# Patient Record
Sex: Female | Born: 1950 | Race: White | Hispanic: No | Marital: Married | Smoking: Current every day smoker
Health system: Southern US, Community
[De-identification: ages and names within clinical notes are randomized; demographics above are authoritative.]

## PROBLEM LIST (undated history)

## (undated) DIAGNOSIS — M1990 Unspecified osteoarthritis, unspecified site: Secondary | ICD-10-CM

## (undated) DIAGNOSIS — Z86010 Personal history of colonic polyps: Secondary | ICD-10-CM

## (undated) DIAGNOSIS — K219 Gastro-esophageal reflux disease without esophagitis: Secondary | ICD-10-CM

## (undated) DIAGNOSIS — I480 Paroxysmal atrial fibrillation: Secondary | ICD-10-CM

## (undated) DIAGNOSIS — J45909 Unspecified asthma, uncomplicated: Secondary | ICD-10-CM

## (undated) DIAGNOSIS — I10 Essential (primary) hypertension: Secondary | ICD-10-CM

## (undated) DIAGNOSIS — K209 Esophagitis, unspecified without bleeding: Secondary | ICD-10-CM

## (undated) DIAGNOSIS — F32A Depression, unspecified: Secondary | ICD-10-CM

## (undated) DIAGNOSIS — K589 Irritable bowel syndrome without diarrhea: Secondary | ICD-10-CM

## (undated) DIAGNOSIS — Z9981 Dependence on supplemental oxygen: Secondary | ICD-10-CM

## (undated) DIAGNOSIS — D649 Anemia, unspecified: Secondary | ICD-10-CM

## (undated) DIAGNOSIS — E785 Hyperlipidemia, unspecified: Secondary | ICD-10-CM

## (undated) DIAGNOSIS — K2950 Unspecified chronic gastritis without bleeding: Secondary | ICD-10-CM

## (undated) DIAGNOSIS — F329 Major depressive disorder, single episode, unspecified: Secondary | ICD-10-CM

## (undated) DIAGNOSIS — R32 Unspecified urinary incontinence: Secondary | ICD-10-CM

## (undated) DIAGNOSIS — H53451 Other localized visual field defect, right eye: Secondary | ICD-10-CM

## (undated) DIAGNOSIS — K449 Diaphragmatic hernia without obstruction or gangrene: Secondary | ICD-10-CM

## (undated) DIAGNOSIS — E0590 Thyrotoxicosis, unspecified without thyrotoxic crisis or storm: Secondary | ICD-10-CM

## (undated) DIAGNOSIS — E119 Type 2 diabetes mellitus without complications: Secondary | ICD-10-CM

## (undated) DIAGNOSIS — I4892 Unspecified atrial flutter: Principal | ICD-10-CM

## (undated) DIAGNOSIS — K2980 Duodenitis without bleeding: Secondary | ICD-10-CM

## (undated) DIAGNOSIS — I639 Cerebral infarction, unspecified: Secondary | ICD-10-CM

## (undated) DIAGNOSIS — J449 Chronic obstructive pulmonary disease, unspecified: Secondary | ICD-10-CM

## (undated) DIAGNOSIS — K5790 Diverticulosis of intestine, part unspecified, without perforation or abscess without bleeding: Secondary | ICD-10-CM

## (undated) DIAGNOSIS — J189 Pneumonia, unspecified organism: Secondary | ICD-10-CM

## (undated) DIAGNOSIS — D126 Benign neoplasm of colon, unspecified: Secondary | ICD-10-CM

## (undated) HISTORY — DX: Hyperlipidemia, unspecified: E78.5

## (undated) HISTORY — DX: Unspecified chronic gastritis without bleeding: K29.50

## (undated) HISTORY — DX: Unspecified osteoarthritis, unspecified site: M19.90

## (undated) HISTORY — DX: Esophagitis, unspecified without bleeding: K20.90

## (undated) HISTORY — DX: Duodenitis without bleeding: K29.80

## (undated) HISTORY — PX: UPPER GI ENDOSCOPY: SHX6162

## (undated) HISTORY — PX: COLONOSCOPY: SHX174

## (undated) HISTORY — PX: TRACHEOSTOMY: SUR1362

## (undated) HISTORY — DX: Esophagitis, unspecified: K20.9

## (undated) HISTORY — DX: Personal history of colonic polyps: Z86.010

## (undated) HISTORY — DX: Benign neoplasm of colon, unspecified: D12.6

## (undated) HISTORY — DX: Anemia, unspecified: D64.9

## (undated) HISTORY — DX: Chronic obstructive pulmonary disease, unspecified: J44.9

## (undated) HISTORY — DX: Essential (primary) hypertension: I10

## (undated) HISTORY — DX: Cerebral infarction, unspecified: I63.9

## (undated) HISTORY — DX: Diverticulosis of intestine, part unspecified, without perforation or abscess without bleeding: K57.90

## (undated) HISTORY — DX: Unspecified asthma, uncomplicated: J45.909

## (undated) HISTORY — DX: Diaphragmatic hernia without obstruction or gangrene: K44.9

## (undated) HISTORY — DX: Irritable bowel syndrome, unspecified: K58.9

## (undated) HISTORY — DX: Gastro-esophageal reflux disease without esophagitis: K21.9

## (undated) HISTORY — DX: Unspecified urinary incontinence: R32

---

## 1979-08-31 HISTORY — PX: CHOLECYSTECTOMY: SHX55

## 1985-12-30 HISTORY — PX: BREAST BIOPSY: SHX20

## 1990-12-30 HISTORY — PX: VAGINAL HYSTERECTOMY: SUR661

## 1998-06-05 ENCOUNTER — Ambulatory Visit: Admission: RE | Admit: 1998-06-05 | Discharge: 1998-06-05 | Payer: Self-pay | Admitting: Internal Medicine

## 1998-11-08 ENCOUNTER — Ambulatory Visit (HOSPITAL_COMMUNITY): Admission: RE | Admit: 1998-11-08 | Discharge: 1998-11-08 | Payer: Self-pay | Admitting: Internal Medicine

## 1999-08-06 ENCOUNTER — Encounter: Payer: Self-pay | Admitting: Emergency Medicine

## 1999-08-06 ENCOUNTER — Emergency Department (HOSPITAL_COMMUNITY): Admission: EM | Admit: 1999-08-06 | Discharge: 1999-08-06 | Payer: Self-pay | Admitting: Emergency Medicine

## 1999-10-09 ENCOUNTER — Emergency Department (HOSPITAL_COMMUNITY): Admission: EM | Admit: 1999-10-09 | Discharge: 1999-10-09 | Payer: Self-pay | Admitting: Emergency Medicine

## 2000-05-06 ENCOUNTER — Encounter: Payer: Self-pay | Admitting: Urology

## 2000-05-08 ENCOUNTER — Ambulatory Visit (HOSPITAL_COMMUNITY): Admission: RE | Admit: 2000-05-08 | Discharge: 2000-05-08 | Payer: Self-pay | Admitting: Urology

## 2000-12-19 ENCOUNTER — Emergency Department (HOSPITAL_COMMUNITY): Admission: EM | Admit: 2000-12-19 | Discharge: 2000-12-19 | Payer: Self-pay | Admitting: Emergency Medicine

## 2001-05-29 ENCOUNTER — Ambulatory Visit (HOSPITAL_COMMUNITY): Admission: RE | Admit: 2001-05-29 | Discharge: 2001-05-29 | Payer: Self-pay | Admitting: Internal Medicine

## 2001-10-01 DIAGNOSIS — K2980 Duodenitis without bleeding: Secondary | ICD-10-CM

## 2001-10-01 HISTORY — DX: Duodenitis without bleeding: K29.80

## 2001-10-02 ENCOUNTER — Encounter (INDEPENDENT_AMBULATORY_CARE_PROVIDER_SITE_OTHER): Payer: Self-pay | Admitting: Specialist

## 2001-10-02 ENCOUNTER — Other Ambulatory Visit: Admission: RE | Admit: 2001-10-02 | Discharge: 2001-10-02 | Payer: Self-pay | Admitting: Gastroenterology

## 2002-06-23 ENCOUNTER — Encounter: Payer: Self-pay | Admitting: Pulmonary Disease

## 2002-06-23 ENCOUNTER — Ambulatory Visit (HOSPITAL_COMMUNITY): Admission: RE | Admit: 2002-06-23 | Discharge: 2002-06-23 | Payer: Self-pay | Admitting: Pulmonary Disease

## 2002-07-21 ENCOUNTER — Ambulatory Visit: Admission: RE | Admit: 2002-07-21 | Discharge: 2002-07-21 | Payer: Self-pay | Admitting: Pulmonary Disease

## 2006-01-22 ENCOUNTER — Emergency Department (HOSPITAL_COMMUNITY): Admission: EM | Admit: 2006-01-22 | Discharge: 2006-01-23 | Payer: Self-pay | Admitting: Emergency Medicine

## 2006-09-11 ENCOUNTER — Ambulatory Visit: Payer: Self-pay | Admitting: Internal Medicine

## 2007-12-23 ENCOUNTER — Emergency Department (HOSPITAL_COMMUNITY): Admission: EM | Admit: 2007-12-23 | Discharge: 2007-12-24 | Payer: Self-pay | Admitting: Emergency Medicine

## 2008-02-05 ENCOUNTER — Ambulatory Visit: Payer: Self-pay | Admitting: Internal Medicine

## 2008-02-05 DIAGNOSIS — J209 Acute bronchitis, unspecified: Secondary | ICD-10-CM

## 2008-02-05 DIAGNOSIS — J449 Chronic obstructive pulmonary disease, unspecified: Secondary | ICD-10-CM | POA: Insufficient documentation

## 2008-02-05 DIAGNOSIS — J4489 Other specified chronic obstructive pulmonary disease: Secondary | ICD-10-CM | POA: Insufficient documentation

## 2008-02-05 DIAGNOSIS — Z860100 Personal history of colon polyps, unspecified: Secondary | ICD-10-CM

## 2008-02-05 DIAGNOSIS — K449 Diaphragmatic hernia without obstruction or gangrene: Secondary | ICD-10-CM | POA: Insufficient documentation

## 2008-02-05 DIAGNOSIS — K219 Gastro-esophageal reflux disease without esophagitis: Secondary | ICD-10-CM

## 2008-02-05 HISTORY — DX: Personal history of colon polyps, unspecified: Z86.0100

## 2008-02-05 HISTORY — DX: Personal history of colonic polyps: Z86.010

## 2008-02-24 ENCOUNTER — Ambulatory Visit: Payer: Self-pay | Admitting: Internal Medicine

## 2008-02-24 DIAGNOSIS — E669 Obesity, unspecified: Secondary | ICD-10-CM

## 2008-02-25 ENCOUNTER — Ambulatory Visit: Payer: Self-pay | Admitting: Cardiology

## 2008-03-07 ENCOUNTER — Ambulatory Visit: Payer: Self-pay | Admitting: Internal Medicine

## 2008-03-08 ENCOUNTER — Encounter: Payer: Self-pay | Admitting: Cardiology

## 2008-03-08 ENCOUNTER — Ambulatory Visit: Payer: Self-pay

## 2008-03-18 ENCOUNTER — Encounter: Payer: Self-pay | Admitting: Internal Medicine

## 2008-03-18 ENCOUNTER — Ambulatory Visit: Payer: Self-pay | Admitting: Internal Medicine

## 2008-04-04 ENCOUNTER — Telehealth (INDEPENDENT_AMBULATORY_CARE_PROVIDER_SITE_OTHER): Payer: Self-pay | Admitting: *Deleted

## 2008-04-05 ENCOUNTER — Ambulatory Visit: Payer: Self-pay | Admitting: Internal Medicine

## 2008-04-19 ENCOUNTER — Ambulatory Visit: Payer: Self-pay | Admitting: Internal Medicine

## 2008-04-20 ENCOUNTER — Ambulatory Visit: Payer: Self-pay | Admitting: Cardiology

## 2008-04-27 ENCOUNTER — Ambulatory Visit: Payer: Self-pay | Admitting: Internal Medicine

## 2008-04-27 ENCOUNTER — Telehealth: Payer: Self-pay | Admitting: Internal Medicine

## 2008-06-21 ENCOUNTER — Ambulatory Visit: Payer: Self-pay | Admitting: Internal Medicine

## 2008-06-21 DIAGNOSIS — I10 Essential (primary) hypertension: Secondary | ICD-10-CM

## 2008-07-11 ENCOUNTER — Telehealth: Payer: Self-pay | Admitting: Internal Medicine

## 2008-08-11 ENCOUNTER — Telehealth (INDEPENDENT_AMBULATORY_CARE_PROVIDER_SITE_OTHER): Payer: Self-pay | Admitting: *Deleted

## 2008-08-12 ENCOUNTER — Encounter: Payer: Self-pay | Admitting: Internal Medicine

## 2008-09-13 ENCOUNTER — Telehealth (INDEPENDENT_AMBULATORY_CARE_PROVIDER_SITE_OTHER): Payer: Self-pay | Admitting: *Deleted

## 2009-11-19 IMAGING — CR DG CHEST 2V
2 series · 2 of 2 positions shown · non-contrast
Comparison: 01/23/06.

CLINICAL DATA: Cough and dyspnea for 3 months.  COPD. 
 CHEST - 2 VIEW:

[view not recorded (1 of 2)]
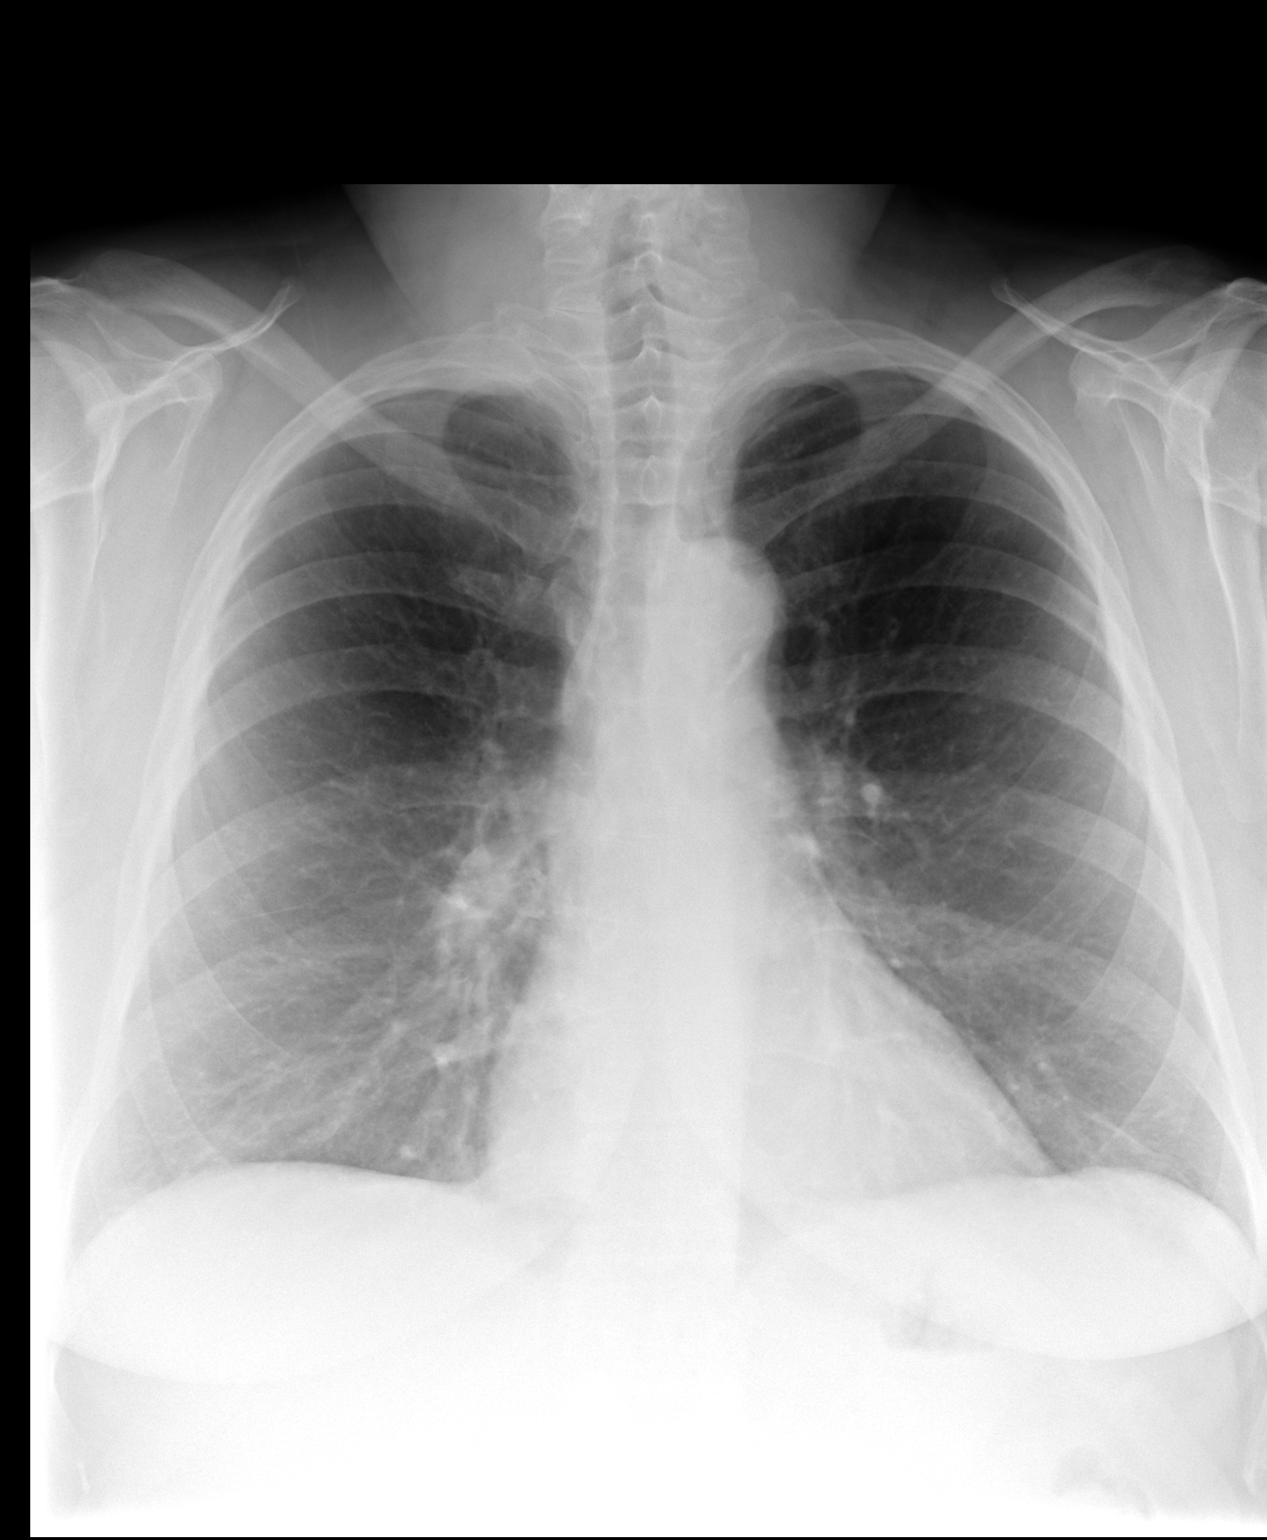

[view not recorded (2 of 2)]
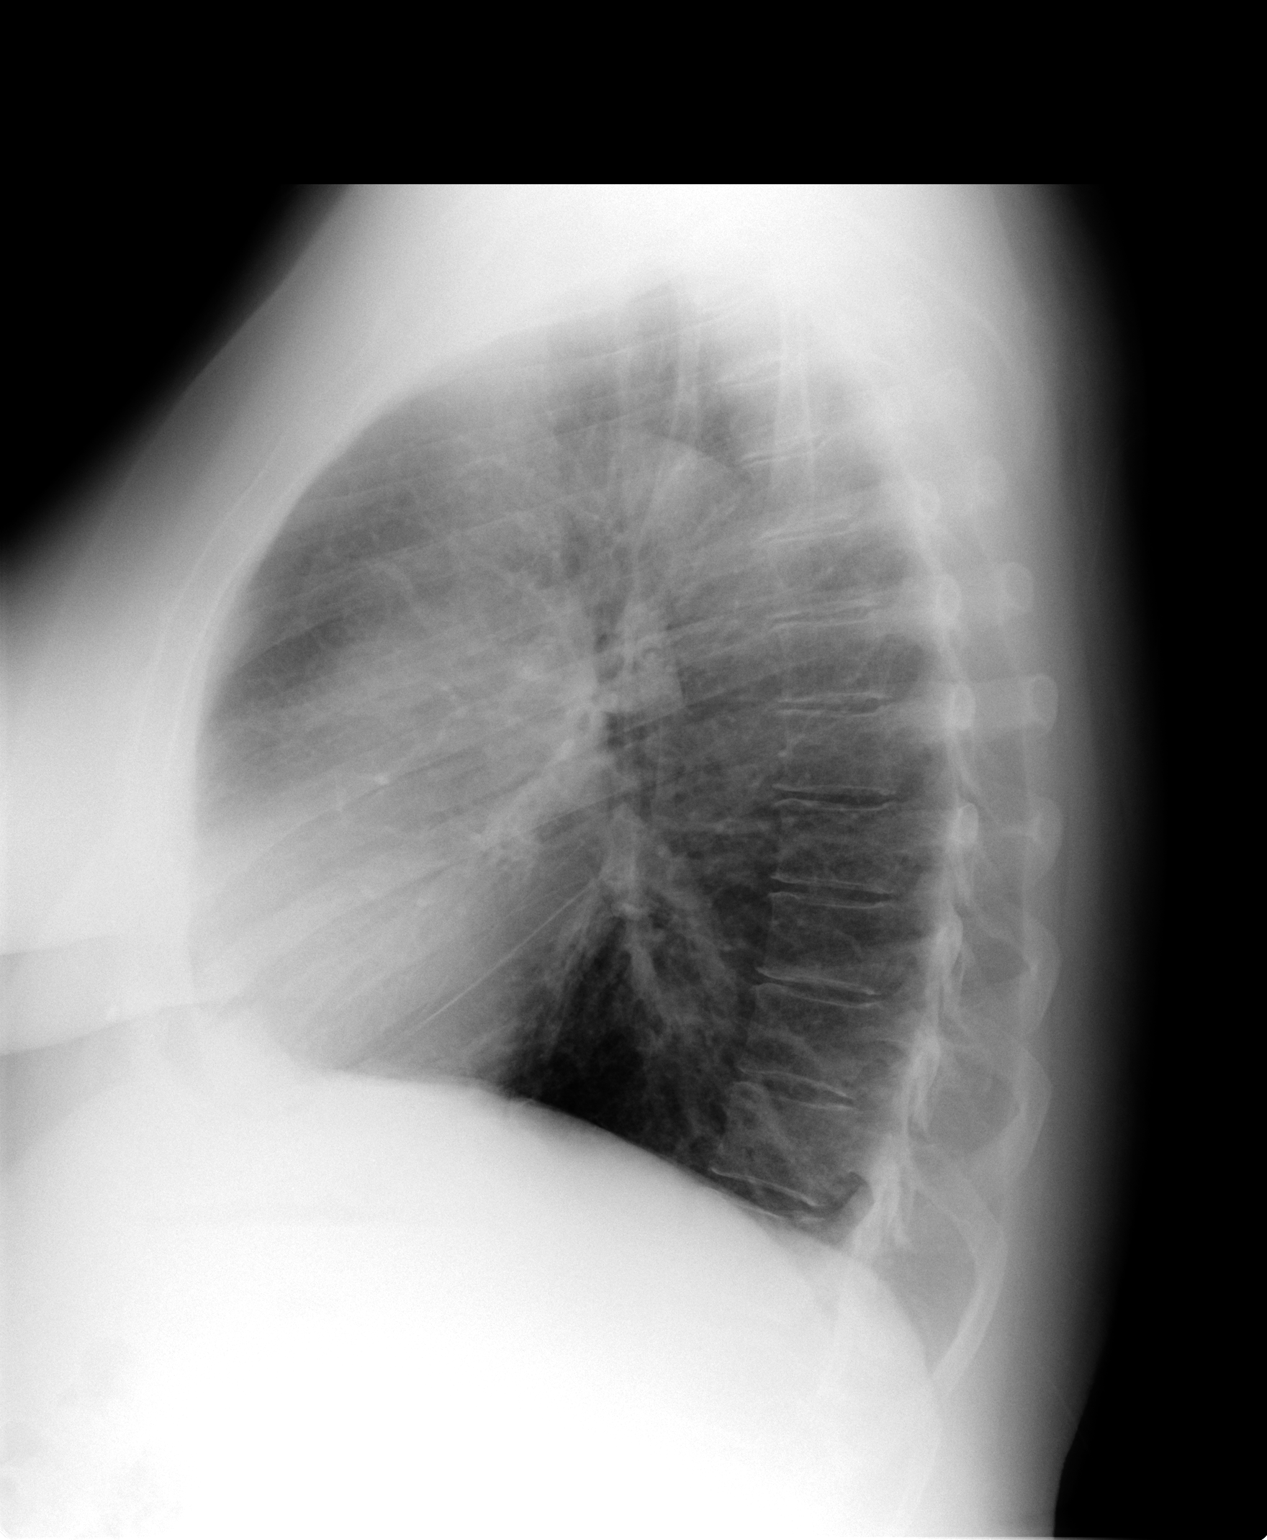

[2 of 2 positions shown; findings below may reference images not displayed]

FINDINGS: The heart size and mediastinal contours are stable.  The lungs are mildly hyperinflated with stable chronic central airway thickening.  There is no edema, confluent airspace opacity or pleural effusion.
IMPRESSION: Stable examination with evidence of chronic obstructive pulmonary disease.  No acute findings.

## 2009-12-30 DIAGNOSIS — J189 Pneumonia, unspecified organism: Secondary | ICD-10-CM

## 2009-12-30 HISTORY — DX: Pneumonia, unspecified organism: J18.9

## 2010-06-20 DIAGNOSIS — R079 Chest pain, unspecified: Secondary | ICD-10-CM | POA: Insufficient documentation

## 2010-06-20 DIAGNOSIS — R0602 Shortness of breath: Secondary | ICD-10-CM | POA: Insufficient documentation

## 2010-06-20 DIAGNOSIS — R1310 Dysphagia, unspecified: Secondary | ICD-10-CM | POA: Insufficient documentation

## 2010-06-20 DIAGNOSIS — R002 Palpitations: Secondary | ICD-10-CM

## 2010-06-20 DIAGNOSIS — I4891 Unspecified atrial fibrillation: Secondary | ICD-10-CM

## 2010-08-08 ENCOUNTER — Ambulatory Visit: Payer: Self-pay | Admitting: Cardiology

## 2010-08-08 DIAGNOSIS — I359 Nonrheumatic aortic valve disorder, unspecified: Secondary | ICD-10-CM | POA: Insufficient documentation

## 2010-08-08 DIAGNOSIS — E785 Hyperlipidemia, unspecified: Secondary | ICD-10-CM

## 2010-08-08 DIAGNOSIS — F17200 Nicotine dependence, unspecified, uncomplicated: Secondary | ICD-10-CM

## 2010-08-08 DIAGNOSIS — R0989 Other specified symptoms and signs involving the circulatory and respiratory systems: Secondary | ICD-10-CM

## 2010-09-17 ENCOUNTER — Telehealth (INDEPENDENT_AMBULATORY_CARE_PROVIDER_SITE_OTHER): Payer: Self-pay

## 2010-10-02 ENCOUNTER — Telehealth (INDEPENDENT_AMBULATORY_CARE_PROVIDER_SITE_OTHER): Payer: Self-pay

## 2010-10-03 ENCOUNTER — Ambulatory Visit: Payer: Self-pay | Admitting: Cardiovascular Disease

## 2010-10-03 ENCOUNTER — Encounter: Payer: Self-pay | Admitting: Cardiovascular Disease

## 2010-10-03 ENCOUNTER — Ambulatory Visit (HOSPITAL_COMMUNITY)
Admission: RE | Admit: 2010-10-03 | Discharge: 2010-10-03 | Payer: Self-pay | Source: Home / Self Care | Admitting: Cardiology

## 2010-10-03 ENCOUNTER — Encounter: Payer: Self-pay | Admitting: Cardiology

## 2010-10-03 ENCOUNTER — Ambulatory Visit: Payer: Self-pay

## 2010-10-03 ENCOUNTER — Encounter (HOSPITAL_COMMUNITY)
Admission: RE | Admit: 2010-10-03 | Discharge: 2010-10-15 | Payer: Self-pay | Source: Home / Self Care | Admitting: Cardiology

## 2010-10-04 ENCOUNTER — Telehealth: Payer: Self-pay | Admitting: Cardiology

## 2010-10-09 DIAGNOSIS — E079 Disorder of thyroid, unspecified: Secondary | ICD-10-CM | POA: Insufficient documentation

## 2010-10-31 ENCOUNTER — Ambulatory Visit (HOSPITAL_COMMUNITY)
Admission: RE | Admit: 2010-10-31 | Discharge: 2010-10-31 | Payer: Self-pay | Source: Home / Self Care | Admitting: Cardiology

## 2010-11-23 ENCOUNTER — Encounter (INDEPENDENT_AMBULATORY_CARE_PROVIDER_SITE_OTHER): Payer: Self-pay | Admitting: *Deleted

## 2011-01-29 NOTE — Progress Notes (Signed)
Summary: Nuc. Pre-Procedure  Phone Note Outgoing Call Call back at Cell: [phone]   Call placed by: Irean Hong, RN,  October 02, 2010 3:12 PM Summary of Call: Reviewed information on Myoview Information Sheet (see scanned document for further details).  Spoke with patient.     Nuclear Med Background Indications for Stress Test: Evaluation for Ischemia   History: COPD, Echo, Heart Catheterization, Myocardial Perfusion Study  History Comments: '07 Cath: NL '09 MPS: NL "09 Echo: Mild AS, EF NL Hx. AFIB  Symptoms: DOE, Palpitations, SOB    Nuclear Pre-Procedure Cardiac Risk Factors: Family History - CAD, Lipids, Smoker Height (in): 65

## 2011-01-29 NOTE — Progress Notes (Signed)
Summary: pt request call   Phone Note Call from Patient Call back at [phone]   Caller: Patient Summary of Call: Pt request call Initial call taken by: Judie Grieve,  October 04, 2010 8:10 AM  Follow-up for Phone Call        Phone Call Completed PT AWARE OF ECHO AND MYOVIEW RESULTS. Follow-up by: Scherrie Bateman, LPN,  October 04, 2010 8:20 AM

## 2011-01-29 NOTE — Assessment & Plan Note (Signed)
Summary: ec6/ pt has medicade/ gd  Medications Added OMEPRAZOLE 40 MG CPDR (OMEPRAZOLE) 1 tab by mouth two times a day CITALOPRAM HYDROBROMIDE 40 MG TABS (CITALOPRAM HYDROBROMIDE) 1 tab by mouth once daily FUROSEMIDE 40 MG TABS (FUROSEMIDE) Take one tablet by mouth daily. SIMVASTATIN 40 MG TABS (SIMVASTATIN) Take one tablet by mouth daily at bedtime VESICARE 10 MG TABS (SOLIFENACIN SUCCINATE) 1  tab by mouth once daily AMLODIPINE BESYLATE 5 MG TABS (AMLODIPINE BESYLATE) Take one tablet by mouth daily SPIRIVA HANDIHALER 18 MCG CAPS (TIOTROPIUM BROMIDE MONOHYDRATE) as directed ADVAIR DISKUS 250-50 MCG/DOSE AEPB (FLUTICASONE-SALMETEROL) as directed        Primary Colleen [name]:  Allean Found  CC:  sob due to copd she states.  History of Present Illness: There was a question of atrial fibrillation previously but not documented. She had been seen by Dr. Tresa Endo at that time. Cardiac catheterization in March of 2007 showed normal LV function and normal coronary arteries. Echocardiogram in March of 2009 showed normal LV function and very mild aortic stenosis with a mean gradient of 8 mm of mercury. A monitor performed in March of 2009 revealed sinus rhythm. A Myoview in March of 2009 showed an ejection fraction of 67% and normal perfusion. I have not seen the patient since February of 2009. Patient apparently smokes approximately 3 packs of cigarettes per day. She is on home oxygen. She does have some dyspnea on exertion relieved with rest. There is no orthopnea, PND or pedal edema. She occasionally has chest pain in the substernal area radiating to the back. It lasts one hour and resolved spontaneously. It is not pleuritic or positional nor is exertional. She also has palpitations 3 times weekly. Her heart rate increases to 150-180 her report. Apparently an ambulance was called recently and her heart rate was 150 but she refused to go to hospital. She does not have details.  Current Medications  (verified): 1)  Aspirin 325 Mg  Tabs (Aspirin) .... Take 1 Tablet By Mouth Once A Day 2)  Omeprazole 40 Mg Cpdr (Omeprazole) .[name].. 1 Tab By Mouth Two Times A Day 3)  Citalopram Hydrobromide 40 Mg Tabs (Citalopram Hydrobromide) .[name].. 1 Tab By Mouth Once Daily 4)  Ipratropium-Albuterol 0.5-2.5 (3) Mg/26ml  Soln (Ipratropium-Albuterol) .[name].. 1 Neb Every 6 Hours As Needed 5)  Xopenex Hfa 45 Mcg/act  Aero (Levalbuterol Tartrate) .... 2 Puffs Every 4 Hrs As Needed 6)  Alprazolam 0.5 Mg  Tabs (Alprazolam) .... Take 1 Tablet By Mouth Two Times A Day As Needed 7)  Ibuprofen 800 Mg  Tabs (Ibuprofen) .[name].. 1 Every 8 Hours As Needed 8)  Benicar Hct 20-12.5 Mg  Tabs (Olmesartan Medoxomil-Hctz) .... One Tablet By Mouth Daily 9)  Furosemide 40 Mg Tabs (Furosemide) .... Take One Tablet By Mouth Daily. 10)  Simvastatin 40 Mg Tabs (Simvastatin) .... Take One Tablet By Mouth Daily At Bedtime 11)  Vesicare 10 Mg Tabs (Solifenacin Succinate) .[name].. 1  Tab By Mouth Once Daily 12)  Amlodipine Besylate 5 Mg Tabs (Amlodipine Besylate) .... Take One Tablet By Mouth Daily 13)  Spiriva Handihaler 18 Mcg Caps (Tiotropium Bromide Monohydrate) .... As Directed 14)  Advair Diskus 250-50 Mcg/dose Aepb (Fluticasone-Salmeterol) .... As Directed  Allergies: No Known Drug Allergies  Past History:  Past Medical History: ?ATRIAL FIBRILLATION HYPERTENSION, BENIGN  HIATAL HERNIA  POLYP, COLON ASTHMATIC BRONCHITIS, ACUTE  GASTROESOPHAGEAL REFLUX DISEASE (ICD-530.81) EGD neg 03/07/08, but prev required dilation COPD (ICD-496) PFTs 4/09 FEV1 56% ratio 66% with 12% improvement after  bronchodilators diffusing capacity 42% chronic irritable bowel syndrome.   Past Surgical History: Reviewed history from 02/24/2008 and no changes required. Cholecystectomy Hysterectomy/BSO/Bladder Repair Breast Surgery  Social History: Reviewed history from 06/20/2010 and no changes required.  She is disabled.  She is currently on Medicaid.  She is    separated for many years, though she still sees her husband.  She lives with herself and her daughter.  She was formerly in [employer].  COPD led to her disability.  She is trying to quit smoking   she tells me.  She smokes more than a pack per day for many years.      Review of Systems       no fevers or chills, productive cough, hemoptysis, dysphasia, odynophagia, melena, hematochezia, dysuria, hematuria, rash, seizure activity, orthopnea, PND, pedal edema, claudication. Remaining systems are negative.   Vital Signs:  Patient profile:   60 year old female Height:      65 inches Weight:      203 pounds BMI:     33.90 Pulse rate:   72 / minute Resp:     16 per minute BP sitting:   121 / 73  (left arm)  Vitals Entered By: Kem Parkinson (August 08, 2010 2:44 PM)  Physical Exam  General:  Well-developed well-nourished in no acute distress.  Skin is warm and dry.  HEENT is normal.  Neck is supple. No thyromegaly.  Chest is clear to auscultation with normal expansion.  Cardiovascular exam is regular rate and rhythm. Distant heart sounds. 2/6 systolic murmur left sternal border. Abdominal exam nontender or distended. No masses palpated. Extremities show no edema. neuro grossly intact    EKG  Procedure date:  08/08/2010  Findings:      Normal sinus rhythm at a rate of 78. No ST changes serious  Impression & Recommendations:  Problem # 1:  CHEST PAIN (ICD-786.50) Symptoms atypical. Schedule Myoview. The following medications were removed from the medication list:    Bystolic 5 Mg Tabs (Nebivolol hcl) ..... Once daily Her updated medication list for this problem includes:    Aspirin 325 Mg Tabs (Aspirin) .[name]... Take 1 tablet by mouth once a day    Amlodipine Besylate 5 Mg Tabs (Amlodipine besylate) .[name]... Take one tablet by mouth daily  Orders: Nuclear Stress Test (Nuc Stress Test)  Problem # 2:  PALPITATIONS (ICD-785.1) Etiology unclear. Schedule  CardioNet. The following medications were removed from the medication list:    Bystolic 5 Mg Tabs (Nebivolol hcl) ..... Once daily Her updated medication list for this problem includes:    Aspirin 325 Mg Tabs (Aspirin) .[name]... Take 1 tablet by mouth once a day    Amlodipine Besylate 5 Mg Tabs (Amlodipine besylate) .[name]... Take one tablet by mouth daily  Orders: Event (Event)  Problem # 3:  HYPERTENSION, BENIGN (ICD-401.1) Blood pressure controlled on present medications. Will continue. The following medications were removed from the medication list:    Caduet 10-40 Mg Tabs (Amlodipine-atorvastatin) ..... Once daily    Bystolic 5 Mg Tabs (Nebivolol hcl) ..... Once daily Her updated medication list for this problem includes:    Aspirin 325 Mg Tabs (Aspirin) .[name]... Take 1 tablet by mouth once a day    Benicar Hct 20-12.5 Mg Tabs (Olmesartan medoxomil-hctz) ..... One tablet by mouth daily    Furosemide 40 Mg Tabs (Furosemide) .[name]... Take one tablet by mouth daily.    Amlodipine Besylate 5 Mg Tabs (Amlodipine besylate) .[name]... Take  one tablet by mouth daily  Problem # 4:  COPD (ICD-496) Management per pulmonary. The following medications were removed from the medication list:    Symbicort 160-4.5 Mcg/act Aero (Budesonide-formoterol fumarate) .[name]..[name] Two puffs every 12 hours Her updated medication list for this problem includes:    Ipratropium-albuterol 0.5-2.5 (3) Mg/52ml Soln (Ipratropium-albuterol) .[name]... 1 neb every 6 hours as needed    Xopenex Hfa 45 Mcg/act Aero (Levalbuterol tartrate) .[name]... 2 puffs every 4 hrs as needed    Spiriva Handihaler 18 Mcg Caps (Tiotropium bromide monohydrate) .[name]... As directed    Advair Diskus 250-50 Mcg/dose Aepb (Fluticasone-salmeterol) .[name]... As directed  Problem # 5:  AORTIC VALVE DISORDERS (ICD-424.1) Repeat echocardiogram. The following medications were removed from the medication list:    Bystolic 5 Mg Tabs (Nebivolol hcl) ..... Once daily Her updated  medication list for this problem includes:    Benicar Hct 20-12.5 Mg Tabs (Olmesartan medoxomil-hctz) ..... One tablet by mouth daily    Furosemide 40 Mg Tabs (Furosemide) .[name]... Take one tablet by mouth daily.  Problem # 6:  TOBACCO ABUSE (ICD-305.1) Patient counseled on discontinuing between 3-10 minutes.  Problem # 7:  HYPERLIPIDEMIA (ICD-272.4) Continue statin. Lipids and liver monitored by primary care. Renal function and potassium also monitored by primary care. The following medications were removed from the medication list:    Caduet 10-40 Mg Tabs (Amlodipine-atorvastatin) ..... Once daily Her updated medication list for this problem includes:    Simvastatin 40 Mg Tabs (Simvastatin) .[name]... Take one tablet by mouth daily at bedtime  Other Orders: Echocardiogram (Echo)  Patient Instructions: 1)  Your physician recommends that you schedule a follow-up appointment in: 8 WEEKS 2)  Your physician has requested that you have an echocardiogram.  Echocardiography is a painless test that uses sound waves to create images of your heart. It provides your doctor with information about the size and shape of your heart and how well your heart's chambers and valves are working.  This procedure takes approximately one hour. There are no restrictions for this procedure. 3)  Your physician has recommended that you wear an event monitor.  Event monitors are medical devices that record the heart's electrical activity. Doctors most often use these monitors to diagnose arrhythmias. Arrhythmias are problems with the speed or rhythm of the heartbeat. The monitor is a small, portable device. You can wear one while you do your normal daily activities. This is usually used to diagnose what is causing palpitations/syncope (passing out). 4)  Your physician has requested that you have an exercise stress myoview.  For further information please visit [email or web address]  Please follow instruction sheet, as  given.  Appended Document: Orders Update    Clinical Lists Changes  Problems: Added new problem of CAROTID BRUIT (ICD-785.9) Orders: Added new Test order of Carotid Duplex (Carotid Duplex) - Signed      Appended Document: ec6/ pt has medicade/ gd Check carotid Dopplers for bruits.

## 2011-01-29 NOTE — Progress Notes (Signed)
Summary: Nuc. Pre-Procedure  Phone Note Outgoing Call Call back at cell [phone]   Call placed by: Irean Hong, RN,  September 17, 2010 3:45 PM Summary of Call: Reviewed information on Myoview Information Sheet (see scanned document for further details).  Spoke with patient.     Nuclear Med Background Indications for Stress Test: Evaluation for Ischemia   History: COPD, Echo, Heart Catheterization, Myocardial Perfusion Study  History Comments: '07 Cath: NL '09 MPS: NL "09 Echo: Mild AS, EF NL Hx. AFIB  Symptoms: DOE, Palpitations, SOB    Nuclear Pre-Procedure Cardiac Risk Factors: Family History - CAD, Lipids, Smoker Height (in): 65

## 2011-01-29 NOTE — Assessment & Plan Note (Signed)
Summary: Cardiology Nuclear Testing  Nuclear Med Background Indications for Stress Test: Evaluation for Ischemia   History: Asthma, Cardioversion, COPD, Echo, Heart Catheterization, Myocardial Perfusion Study  History Comments: '07 Cath:normal; '09 UJW:[id number]; '09 Echo:mild AS; h/o afib.  Symptoms: Chest Pressure, Diaphoresis, Dizziness, DOE, Nausea, Palpitations, Rapid HR, SOB  Symptoms Comments: Last episode of CP:one month ago.   Nuclear Pre-Procedure Cardiac Risk Factors: CVA, Family History - CAD, Hypertension, Lipids, Obesity, Smoker Caffeine/Decaff Intake: None NPO After: 7:30 PM Lungs: Minimal wheezing at time of IV, none at time of infusion. IV 0.9% NS with Angio Cath: 22g     IV Site: R Antecubital IV Started by: Bonnita Levan, RN Chest Size (in) 38     Cup Size C     Height (in): 65 Weight (lb): 201 BMI: 33.57  Nuclear Med Study 1 or 2 day study:  1 day     Stress Test Type:  Eugenie Birks Reading MD:  Kristeen Miss, MD     Referring MD:  Olga Millers, MD Resting Radionuclide:  Technetium 83m Tetrofosmin     Resting Radionuclide Dose:  11 mCi  Stress Radionuclide:  Technetium 78m Tetrofosmin     Stress Radionuclide Dose:  33 mCi   Stress Protocol   Lexiscan: 0.4 mg   Stress Test Technologist:  Rea College, CMA-N     Nuclear Technologist:  Doyne Keel, CNMT  Rest Procedure  Myocardial perfusion imaging was performed at rest 45 minutes following the intravenous administration of Technetium 15m Tetrofosmin.  Stress Procedure  The patient received IV Lexiscan 0.4 mg over 15-seconds.  Technetium 69m Tetrofosmin injected at 30-seconds.  There were no significant changes with infusion.  She did c/o chest pressure with infusion.  Quantitative spect images were obtained after a 45 minute delay.  QPS Raw Data Images:  Normal; no motion artifact; normal heart/lung ratio. Stress Images:  Normal homogeneous uptake in all areas of the myocardium. Rest Images:  Normal  homogeneous uptake in all areas of the myocardium. Subtraction (SDS):  No evidence of ischemia. Transient Ischemic Dilatation:  .94  (Normal <1.22)  Lung/Heart Ratio:  .27  (Normal <0.45)  Quantitative Gated Spect Images QGS EDV:  86 ml QGS ESV:  23 ml QGS EF:  73 % QGS cine images:  Normal LV function.  Findings Normal nuclear study      Overall Impression  Exercise Capacity: Lexiscan with no exercise. BP Response: Normal blood pressure response. Clinical Symptoms: No chest pain ECG Impression: No significant ST segment change suggestive of ischemia. Overall Impression: Normal stress nuclear study. Overall Impression Comments: No evidence of ischemia. Normal LV function.     Appended Document: Cardiology Nuclear Testing pt aware./cy  Appended Document: Cardiology Nuclear Testing ok

## 2011-01-29 NOTE — Letter (Signed)
Summary: Appointment - Missed  [facility], Main Office  [address]   [place], [place] [address]   Phone: [phone]  Fax: [phone]        November 23, 2010 MRN: [id number]     [facility] [address] [place], [place]  [address]     Dear Ms. Kranz,  Our records indicate you missed your appointment on November 24, 2010 with Dr. Jens Som. It is very important that we reach you to reschedule this appointment. We look forward to participating in your health care needs. Please contact us at the number listed above at your earliest convenience to reschedule this appointment.     Sincerely,   Glass blower/designer

## 2011-05-14 NOTE — Assessment & Plan Note (Signed)
[facility]                         GASTROENTEROLOGY OFFICE NOTE   NAME:Colleen [name], Colleen [name]                         MRN:          [id number]  DATE:02/24/2008                            DOB:          May 11, 1951    CHIEF COMPLAINT:  Not swallowing good and stomach is swelling.   ASSESSMENT:  A 60 year old white woman with:  1. Recurrent dysphagia.  She has a history of this in the past with      gastroesophageal reflux disease, hiatus hernia and previous      dilation by Dr. Victorino Dike in 2002.  2. Abdominal bloating, swelling and chronic constipation consistent      with chronic irritable bowel syndrome.  3. History of diminutive adenomatous colon polyp in 2002.  4. Family history of colon cancer in her father.   PLAN:  1. Schedule an EGD with possible esophageal dilation.  2. Subsequent to that, a week or two later, we will schedule a      colonoscopy to investigate the constipation, history of adenomatous      polyps and family history of colon cancer.   HISTORY:  This lady has had quite a bit of trouble with bloating and  distension over the years.  She is moving her bowels every day with Milk  of Magnesia, but still has problems.  She is also having recurrent solid  food dysphagia for a while.  She does take omeprazole 20 mg 2 each day.  She is not describing heartburn at this time.  There is no rectal  bleeding.  Her weight has actually increased, I believe.   Regarding the dysphagia, she has to regurgitate when she has an  impaction.  There is a lot of bloating and abdominal distention.  She  said Dr. Sherene Sires said that affects her breathing on top of the cigarettes.  She knows she should quit smoking and this is reinforced with her today.   MEDICATIONS:  1. Furosemide 40 mg daily.  2. Sertraline HCL 10 mg at bedtime.  3. Citalopram 20 mg daily.  4. Bystolic 5 mg daily.  5. Benicar 20 mg daily.  6. Omeprazole 20 mg 2 daily.  7. Conduit 10-40 mg  daily.  8. Symbicort 80/4.5 b.i.d.  9. Alprazolam 0.5 mg b.i.d.  10.Tiotropium and Albuterol inhaler p.r.n.   There are no known drug allergies.   SOCIAL HISTORY:  She is disabled.  She is currently on Medicaid.  She is  separated for many years, though she still sees her husband.  She lives  with herself and her daughter.  She was formerly in [employer].  COPD led to her disability.  She is trying to quit smoking  she tells me.  She smokes more than a pack per day for many years.   FAMILY HISTORY:  Mother had heart disease.  Father had colon cancer.   REVIEW OF SYSTEMS:  See my medical history form for full details.  She  has chronic dyspnea, bloating, back pain, frequent cough, urinary  leakage.   PHYSICAL EXAMINATION:  Reveals an obese, chronically  ill, white woman.  Weight 207 pounds.  Blood pressure 110/64, pulse 64.  The eyes are anicteric.  ENT:  She is edentulous.  The neck is supple without thyromegaly or  mass.  CHEST:  Diffusely decreased breath sounds.  HEART:  S1, S2.  Somewhat distant.  No murmurs or gallops heard.  ABDOMEN:  Obese, soft, distended.  No organomegaly, masses or diastasis  recti.  RECTAL:  Exam deferred.  LYMPHATIC:  No neck or supraclavicular nodes.  EXTREMITIES:  There is no peripheral edema.  No cyanosis or clubbing are  noted.  SKIN:  Somewhat dry.  No acute lesions.  NEURO:  Cranial nerves II-XII intact.  PSYCH:  She has appropriate affect.   I have reviewed previous notes from Dr. Sherene Sires that are in the chart as  well as previous records of procedures and Dr. Blossom Hoops office notes as  well form a number of years ago.   I appreciate the opportunity to care for this patient.     Iva Boop, MD,FACG  Electronically Signed    CEG/MedQ  DD: 02/24/2008  DT: 02/24/2008  Job #: [id number]   cc:   Charlaine Dalton. Sherene Sires, MD, [facility]  Nona Dell, MD in [place]

## 2011-05-14 NOTE — Assessment & Plan Note (Signed)
[facility]                            CARDIOLOGY OFFICE NOTE   NAME:Feggins, JANAE BONSER                         MRN:          [id number]  DATE:02/25/2008                            DOB:          [birth year]    Mrs. Graver is a 60 year old female with past medical history of COPD,  question atrial fibrillation who presents for evaluation of dyspnea,  chest tightness and palpitations.  The patient has previously been  followed by Dr. Daphene Jaeger at [facility].  I do have records concerning  a previous catheterization on March 12, 2006.  At that time she had  normal coronary arteries and her ejection fraction was normal.  I also  have a Myoview from 2006 that was normal.  The patient states she does  have a history of leaking valves.  I do not have those records from  [place].  She also apparently has had a rhythm problem.  She states  that approximately 8 months ago she called EMS due to palpitations.  They were sudden in onset.  She apparently was shocked twice en route  but I do not have any of those records available.  I do have records  from a visit to the emergency room in January 2007 that suggested atrial  fibrillation but there is no EKG documentation.  She continues to have  occasional palpitations but not sustained.  They are sudden onset and  described as her heart skipping.  They last for approximately 5  minutes.  There is association with diaphoresis but there is no  increased shortness of breath.  She also has a heaviness in the chest  when she has these.  She also has chronic dyspnea on exertion but there  is no orthopnea or PND.  She also occasionally has pedal edema.  She  also has chest tightness when she exerts herself.  It increases with  inspiration.  There is no other chest pain noted.  The pain does not  radiate, nor is there was associated nausea, vomiting, or diaphoresis.  She would prefer to be followed here and presents as a new  patient.   MEDICATIONS:  Lasix 40 mg p.o. daily, cetirizine 10 mg p.o. q.h.s.,  citalopram 20 mg p.o. daily, Bystolic 5 mg daily, Benicar 20 mg daily,  omeprazole 40 mg daily, Caduet 10/40 daily; Symbicort 80/4.5 b.i.d. and  enteric-coated aspirin 325 mg daily.  She uses alprazolam as needed as  well as Atrovent and albuterol.   ALLERGIES:  She has NO KNOWN DRUG ALLERGIES.   SOCIAL HISTORY:  She does smoke.  She does not consume alcohol.   FAMILY HISTORY:  Significant for mother with a valve replacement.  She  states her sister has a rhythm problem but we are unclear about the  type.   PAST MEDICAL HISTORY:  There is no diabetes mellitus but there is  hypertension and hyperlipidemia.  She also has COPD.  Her cardiac  history is outlined in the HPI.  She apparently has an esophageal  stricture and they are planning esophageal dilatation.  She has had  a  prior hysterectomy as well as cholecystectomy.  There is also history of  anxiety.  There is also history of hiatal hernia.   REVIEW OF SYSTEMS:  She denies any headaches at present.  She did have  fever last week.  There is no chills.  There is no productive cough or  hemoptysis.  There is dysphagia but there is no odynophagia.  There is  no melena or hematochezia.  There is no dysuria or hematuria.  There is  no orthopnea, PND but there is pedal edema.  She has some joint pains.  Remaining systems are negative.   PHYSICAL EXAMINATION:  Today shows a blood pressure 126/70 and pulse of  64.  She weighs 207 pounds.  She is well-developed, somewhat obese.  She  is no acute distress at present.  Skin is warm and dry.  She does not  appear depressed.  There is no peripheral clubbing.  Her back is normal.  HEENT:  Normal, with normal eyelids.  NECK is supple with a normal upstroke bilaterally.  There are soft  bilateral carotid bruits.  There is no jugular venous distention and no  thyromegaly is noted.  CHEST is clear to auscultation,  normal expansion.  CARDIOVASCULAR:  Regular rate and rhythm with normal S1-S2.  There is 1-  2/6 systolic ejection murmur as well as a 2/6 systolic murmur at the  apex.  ABDOMINAL exam nontender, nondistended.  Positive bowel sounds, no  hepatosplenomegaly, no mass appreciated.  There is no abdominal bruit.  She is 2+ femoral pulses bilaterally.  No bruits.  EXTREMITIES:  Show no edema and I can palpate no cords.  She has 2+  dorsalis pedis pulse on the right and 1+ in the left.  NEUROLOGIC:  Exam is grossly intact.   Electrocardiogram shows sinus rhythm at a rate of 64.  There are no ST  changes noted.   DIAGNOSIS:  1. Palpitations - we will schedule her to have a CardioNet monitor for      further evaluation.  We will continue with her Bystolic.  I will      obtain the records from the emergency room in January 2007      concerning the question of atrial fibrillation.  We will also      obtain records from Dr. Landry Dyke office.  If she does have a history      of atrial fibrillation then she may require Coumadin as she has had      previous strokes in the past and has also embolic risk factors of      hypertension.  We will also base this on the CardioNet results.  2. Hypertension - her blood pressure is adequately controlled on      present medications.  I have her most recent electrolytes forwarded      to [place] for our records from Dr. Katrinka Blazing in [facility].  3. Chest pain - this appears to be a pulmonary issue as it increases      with inspiration.  However we will schedule her for a Myoview for      risk stratification.  4. Murmur - we will check an echocardiogram.  5. COPD - she will continue to be monitored by her primary care      physician and Dr. Sherene Sires.  6. Tobacco abuse - I have discussed importance of discontinuing      tobacco.  7. Hyperlipidemia - she will continue on her statin and we  will ask      for most recent lipid and liver tests to be forwarded to [place] for our       records.  8. Carotid bruits - she has had carotid Dopplers previously at Dr.      Landry Dyke office and we will await those records.  If she needs      follow-up then we will arrange that.   I will see back in approximately 8 weeks.     Madolyn Frieze Jens Som, MD, [facility]  Electronically Signed   BSC/MedQ  DD: 02/25/2008  DT: 02/25/2008  Job #: [id number]

## 2011-05-17 NOTE — Op Note (Signed)
[place]  Patient:    Colleen [name], Colleen [name]                         MRN: [id number] Proc. Date: 05/08/00 Adm. Date:  [id number] Disc. Date: [id number] Attending:  Evlyn Clines CC:         Colleen [name], M.D. [facility]                           Operative Report  PROCEDURE:  Pubovaginal sling, anterior repair.  PREOPERATIVE DIAGNOSIS:  Stress urinary incontinence.  POSTOPERATIVE DIAGNOSIS:  Stress urinary incontinence with cystocele.  SURGEON:  Excell Seltzer. Annabell Howells, M.D.  ANESTHESIA:  General.  SPECIMENS:  None.  DRAINS:  Suprapubic tube and vaginal pack.  COMPLICATIONS:  None.  INDICATIONS:  Colleen [name] is a 60 year old, white female with a prior history of anterior repairs who had a recurrent stress incontinence of a significant degree.  It was felt that a pubovaginal sling was indicated for correction.  FINDINGS AND PROCEDURE:  The patient was give p.o. Cipro.  She was taken to the operating room, where general anesthetic was induced.  She was placed in the lithotomy position, and her mons were shaved.  She was prepped with Betadine solution and draped in the usual sterile fashion.  A #16 French Foley catheter was inserted.  The bladder was drained.  The anterior vaginal wall was infiltrated with 5 cc of 1% lidocaine with epinephrine.  A midline, anterior, vaginal incision was made with a knife.  Allis clamps were used to grasp the mucosal edges.  Metzenbaum and Strully scissors were then used to elevate the mucosa off of the pubourethral and pubovesical fascia on each side.  The incision was carried back to the cuff.  There was more cystocele apparent under anesthesia than there had been in the office, and it was felt that anterior repair was going to be required as well.  Once the vaginal mucosa had been dissected off of the bladder, the Strully scissors were used to enter the pubourethral fascia at its attachment to the pubis first on the right  and then on the left.  Finger dissection was then used to create a tract into the rectal pubic space.  At this point, a vaginal sponge was placed.  A transverse incision was made over the pubis in the midline approximately 3 cm wide.  The fat was then spread down to the fascia with a Tresa Endo.  The 2 x 8 cm ______ matrix strip was prepared by soaking in antibiotic solution.  The a #1 Prolene was placed through each end using a quadruple helical stitch.  Once the fascial strip had been prepared, it was tacked over the mid urethra in the midline with a 3-0 Vicryl stitch.  Two passes of the Racz needle were then made, first on the right and then on the left, under digital guidance, and the suspension sutures were drawn into the abdominal incision.  The posterior tacking suture was then placed to secure the graft over the bladder neck. There remained a cystocele bulge proximal to the fascial strip, so a horizontal mattress, 2-0 Vicryl was placed to reduce the remaining bladder herniation.  Once the cystocele had been reduced and the sling was in place, the vaginal wall mucosa was trimmed and then closed using a running, locked 2-0 Vicryl.  Once the closure had been completed, cystoscopy was performed.  No evidence of injury to the bladder wall was noted.  The sling material appeared to be in appropriately positioned at the bladder neck.  The bladder was filled.  The patient was placed in the Trendelenburg.  A Microvasive photo tip suprapubic tube was placed through a separate stab wound superior to the abdominal incision.  Once in the bladder, the trocar was removed, and the coil was formed and secured.  The tube was eventually secured to the skin with a 2-0 silk suture.  At this point, the bladder was drained.  The weighted vaginal retractor was removed.  The cystoscope sheath was reinserted in the urethra and held at a neutral angle while the suspension sutures were each tied over a hemostat under  minimal tension.  Once the sutures were tied, they were tied across the midline to each other, and the long ends were cut.  The knot was tugged back into the abdominal incision, which was then irrigated with antibiotic solution and closed using a running, intracuticular 4-0 Vicryl stitch.  A 2 inch iodoform vaginal packing was placed, and the suprapubic tube was placed to straight drainage.  The suprapubic wound was further reinforced with Steri-Strips.  A dressing was applied.  The patients anesthetic was reversed, and she was taken down from the lithotomy position and moved to the recovery room in stable condition.  There were no complications. DD:  05/08/00 TD:  05/09/00 Job: [id number]

## 2011-05-17 NOTE — Assessment & Plan Note (Signed)
[facility]                                 ON-CALL NOTE   NAME:[name], Colleen VIDA                         MRN:          [id number]  DATE:09/13/2008                            DOB:          [birth year]    PRIMARY CARDIOLOGIST:  Madolyn Frieze. Jens Som, MD, [facility]   [name] is a 60 year old lady with prior history of palpitations,  hypertension, chest pain, and normal coronary arteries by  catheterization in 2007.  She called in this evening reporting that she  had a sharp pain in her right arm that lasted a couple seconds, resolved  spontaneously.  Her question to me was whether or not this could be a  sign of a heart attack.  She is not having any discomfort now and denies  any associated symptoms.  I advised that her symptoms are somewhat  atypical for heart attack; however, there was no way to evaluate her  over the phone.  I advised that if she remains concerned that she should  come into the emergency room for further evaluation.  She says she is  feeling fine now and plans to take a bath prior to going to bed and will  come in if she has any recurrent pain.     Nicolasa Ducking, ANP  Electronically Signed    CB/MedQ  DD: 09/13/2008  DT: 09/14/2008  Job #: [id number]

## 2011-07-01 ENCOUNTER — Ambulatory Visit: Payer: Self-pay | Admitting: Internal Medicine

## 2011-07-29 ENCOUNTER — Encounter: Payer: Self-pay | Admitting: Cardiology

## 2011-08-29 ENCOUNTER — Ambulatory Visit: Payer: Self-pay | Admitting: Cardiology

## 2011-09-24 ENCOUNTER — Ambulatory Visit: Payer: Self-pay | Admitting: Cardiology

## 2011-10-01 ENCOUNTER — Encounter: Payer: Self-pay | Admitting: Cardiology

## 2011-10-04 LAB — I-STAT 8, (EC8 V) (CONVERTED LAB)
BUN: 7
Chloride: 105
Glucose, Bld: 137 — ABNORMAL HIGH
Hemoglobin: 14.6
Potassium: 3.5
Sodium: 138
TCO2: 29
pH, Ven: 7.375 — ABNORMAL HIGH

## 2011-10-04 LAB — POCT I-STAT CREATININE
Creatinine, Ser: 0.9
Operator id: 272551

## 2012-06-29 ENCOUNTER — Ambulatory Visit: Payer: Self-pay | Admitting: Physician Assistant

## 2012-07-07 ENCOUNTER — Ambulatory Visit: Payer: Self-pay | Admitting: Physician Assistant

## 2012-10-02 ENCOUNTER — Encounter: Payer: Self-pay | Admitting: Internal Medicine

## 2012-11-04 ENCOUNTER — Ambulatory Visit: Payer: Self-pay | Admitting: Internal Medicine

## 2013-01-04 ENCOUNTER — Ambulatory Visit: Payer: Self-pay | Admitting: Cardiology

## 2013-01-05 ENCOUNTER — Ambulatory Visit: Payer: Self-pay | Admitting: Physician Assistant

## 2013-03-08 ENCOUNTER — Encounter: Payer: Self-pay | Admitting: Internal Medicine

## 2013-03-11 ENCOUNTER — Encounter: Payer: Self-pay | Admitting: Internal Medicine

## 2013-03-12 ENCOUNTER — Encounter: Payer: Self-pay | Admitting: Internal Medicine

## 2013-03-18 ENCOUNTER — Encounter: Payer: Self-pay | Admitting: Cardiology

## 2013-03-26 ENCOUNTER — Ambulatory Visit: Payer: Self-pay | Admitting: Internal Medicine

## 2013-04-15 ENCOUNTER — Encounter: Payer: Self-pay | Admitting: Physician Assistant

## 2013-06-01 ENCOUNTER — Encounter: Payer: Self-pay | Admitting: Nurse Practitioner

## 2013-06-08 ENCOUNTER — Ambulatory Visit: Payer: Self-pay | Admitting: Nurse Practitioner

## 2013-06-09 ENCOUNTER — Ambulatory Visit: Payer: Self-pay | Admitting: Nurse Practitioner

## 2013-06-28 ENCOUNTER — Ambulatory Visit: Payer: Self-pay | Admitting: Physician Assistant

## 2013-07-30 ENCOUNTER — Encounter: Payer: Self-pay | Admitting: Cardiology

## 2013-07-30 NOTE — Progress Notes (Signed)
HPI: Pleasant female for fu of ? Atrial fibrillation (not documented). Cardiac catheterization in March of 2007 showed normal LV function and normal coronary arteries. A monitor performed in March of 2009 revealed sinus rhythm. A Myoview in March of 2009 showed an ejection fraction of 67% and normal perfusion. Carotid Dopplers in October 2011 showed 0-39% bilateral stenosis. Abnormal thyroid appearance. Dedicated ultrasound showed multinodular border. I have not seen the patient since February of 2009. Patient apparently smokes approximately 3 packs of cigarettes per day. She is on home oxygen. She does have some dyspnea on exertion relieved with rest. There is no orthopnea, PND or pedal edema. She occasionally has chest pain in the substernal area radiating to the back. It lasts one hour and resolved spontaneously. It is not pleuritic or positional nor is exertional. She also has palpitations 3 times weekly. Her heart rate increases to 150-180 her report. Apparently an ambulance was called recently and her heart rate was 150 but she refused to go to hospital. She does not have details.   Current Outpatient Prescriptions  Medication Sig Dispense Refill  . ALPRAZolam (XANAX) 0.5 MG tablet Take 0.5 mg by mouth 2 (two) times daily as needed.        [name] amLODipine (NORVASC) 5 MG tablet Take 5 mg by mouth daily.        [name] aspirin 325 MG tablet Take 325 mg by mouth daily.        . cetirizine (ZYRTEC) 10 MG tablet Take 10 mg by mouth daily.      . citalopram (CELEXA) 40 MG tablet Take 40 mg by mouth daily.        [name] diltiazem (CARDIZEM) 120 MG tablet Take 120 mg by mouth daily.      . DULoxetine (CYMBALTA) 60 MG capsule Take 120 mg by mouth daily.      [name] EPINEPHrine (EPIPEN JR) 0.15 MG/0.3ML injection Inject 0.15 mg into the muscle as needed for anaphylaxis.      [name] Fluticasone-Salmeterol (ADVAIR DISKUS) 250-50 MCG/DOSE AEPB Inhale 1 puff into the lungs as directed.        . furosemide (LASIX) 40 MG tablet  Take 40 mg by mouth daily.        [name] glucose blood test strip 1 each by Other route as needed for other. Use as instructed      . ibuprofen (ADVIL,MOTRIN) 800 MG tablet Take 800 mg by mouth every 8 (eight) hours as needed.        [name] ipratropium-albuterol (DUONEB) 0.5-2.5 (3) MG/3ML SOLN Take 3 mLs by nebulization every 6 (six) hours as needed.        . levalbuterol (XOPENEX HFA) 45 MCG/ACT inhaler Inhale 1-2 puffs into the lungs every 4 (four) hours as needed.        . metFORMIN (GLUMETZA) 1000 MG (MOD) 24 hr tablet Take 1,000 mg by mouth 2 (two) times daily with a meal.      . metoprolol succinate (TOPROL-XL) 25 MG 24 hr tablet Take 25 mg by mouth at bedtime.      [name] olmesartan-hydrochlorothiazide (BENICAR HCT) 20-12.5 MG per tablet Take 1 tablet by mouth daily.        [name] omeprazole (PRILOSEC) 40 MG capsule Take 40 mg by mouth 2 (two) times daily.        . simvastatin (ZOCOR) 40 MG tablet Take 40 mg by mouth at bedtime.        . solifenacin (VESICARE) 10 MG tablet Take 5  mg by mouth daily.        [name] tiotropium (SPIRIVA) 18 MCG inhalation capsule Place 18 mcg into inhaler and inhale as directed.        . triamcinolone cream (KENALOG) 0.1 % Apply 1 application topically 2 (two) times daily.      [name] trimethoprim (TRIMPEX) 100 MG tablet Take 100 mg by mouth at bedtime.      [name] warfarin (COUMADIN) 4 MG tablet Take 4 mg by mouth. As directed      . warfarin (COUMADIN) 5 MG tablet Take 5 mg by mouth. As directed.       No current facility-administered medications for this visit.     Past Medical History  Diagnosis Date  . Atrial fibrillation   . Hypertension     benign  . Hiatal hernia   . Adenomatous polyp of colon   . Acute asthmatic bronchitis   . GERD (gastroesophageal reflux disease)     EGD neg 03/07/2008, but prev required dilation  . COPD (chronic obstructive pulmonary disease)   . IBS (irritable bowel syndrome)     chronic  . Urinary incontinence   . Esophagitis   . Chronic gastritis     . Duodenitis without hemorrhage 10/01/2001  . Diverticulosis   . Personal history of colonic adenomas 02/05/2008        . Stroke     x 2  . Arthritis   . Anemia   . Diabetes   . Hyperlipidemia     Past Surgical History  Procedure Laterality Date  . Cholecystectomy    . Hysterectomy/bso/bladder repair    . Breast surgery    . Colonoscopy    . Upper gi endoscopy      History   Social History  . Marital Status: Legally Separated    Spouse Name: N/A    Number of Children: N/A  . Years of Education: N/A   Occupational History  . Not on file.   Social History Main Topics  . Smoking status: Smoker, Current Status Unknown -- 2.00 packs/day    Types: Cigarettes  . Smokeless tobacco: Never Used  . Alcohol Use: No  . Drug Use: No  . Sexually Active: Not on file   Other Topics Concern  . Not on file   Social History Narrative  . No narrative on file    ROS: no fevers or chills, productive cough, hemoptysis, dysphasia, odynophagia, melena, hematochezia, dysuria, hematuria, rash, seizure activity, orthopnea, PND, pedal edema, claudication. Remaining systems are negative.  Physical Exam: Well-developed well-nourished in no acute distress.  Skin is warm and dry.  HEENT is normal.  Neck is supple.  Chest is clear to auscultation with normal expansion.  Cardiovascular exam is regular rate and rhythm.  Abdominal exam nontender or distended. No masses palpated. Extremities show no edema. neuro grossly intact  ECG     This encounter was created in error - please disregard.

## 2013-09-08 ENCOUNTER — Ambulatory Visit: Payer: Self-pay | Admitting: Gastroenterology

## 2013-10-08 ENCOUNTER — Encounter: Payer: Self-pay | Admitting: Cardiology

## 2013-10-08 NOTE — Progress Notes (Signed)
HPI: 62 year-old female for evaluation of chest pain. Previously seen in this office but not since 8/11. There was a question of atrial fibrillation previously but not documented. Monitor in 2009 showed sinus rhythm. Cardiac catheterization in March of 2007 showed normal LV function and normal coronary arteries. Carotid Dopplers in October of 2011 showed 0-39% bilateral stenosis, mild right subclavian stenosis and abnormal thyroid appearance. Followup thyroid ultrasound suggested goiter. Nuclear study in October of 2011 showed an ejection fraction of 73% and normal perfusion. Echocardiogram in October of 2011 showed normal LV function and mild left atrial enlargement.   Current Outpatient Prescriptions  Medication Sig Dispense Refill  . ALPRAZolam (XANAX) 0.5 MG tablet Take 0.5 mg by mouth 2 (two) times daily as needed.        [name] amLODipine (NORVASC) 5 MG tablet Take 5 mg by mouth daily.        [name] aspirin 325 MG tablet Take 325 mg by mouth daily.        . cetirizine (ZYRTEC) 10 MG tablet Take 10 mg by mouth daily.      . citalopram (CELEXA) 40 MG tablet Take 40 mg by mouth daily.        [name] diltiazem (CARDIZEM) 120 MG tablet Take 120 mg by mouth daily.      . DULoxetine (CYMBALTA) 60 MG capsule Take 120 mg by mouth daily.      [name] EPINEPHrine (EPIPEN JR) 0.15 MG/0.3ML injection Inject 0.15 mg into the muscle as needed for anaphylaxis.      [name] Fluticasone-Salmeterol (ADVAIR DISKUS) 250-50 MCG/DOSE AEPB Inhale 1 puff into the lungs as directed.        . furosemide (LASIX) 40 MG tablet Take 40 mg by mouth daily.        [name] glucose blood test strip 1 each by Other route as needed for other. Use as instructed      . ibuprofen (ADVIL,MOTRIN) 800 MG tablet Take 800 mg by mouth every 8 (eight) hours as needed.        [name] ipratropium-albuterol (DUONEB) 0.5-2.5 (3) MG/3ML SOLN Take 3 mLs by nebulization every 6 (six) hours as needed.        . levalbuterol (XOPENEX HFA) 45 MCG/ACT inhaler Inhale 1-2 puffs into  the lungs every 4 (four) hours as needed.        . metFORMIN (GLUMETZA) 1000 MG (MOD) 24 hr tablet Take 1,000 mg by mouth 2 (two) times daily with a meal.      . metoprolol succinate (TOPROL-XL) 25 MG 24 hr tablet Take 25 mg by mouth at bedtime.      [name] olmesartan-hydrochlorothiazide (BENICAR HCT) 20-12.5 MG per tablet Take 1 tablet by mouth daily.        [name] omeprazole (PRILOSEC) 40 MG capsule Take 40 mg by mouth 2 (two) times daily.        . simvastatin (ZOCOR) 40 MG tablet Take 40 mg by mouth at bedtime.        . solifenacin (VESICARE) 10 MG tablet Take 5 mg by mouth daily.        [name] tiotropium (SPIRIVA) 18 MCG inhalation capsule Place 18 mcg into inhaler and inhale as directed.        . triamcinolone cream (KENALOG) 0.1 % Apply 1 application topically 2 (two) times daily.      [name] trimethoprim (TRIMPEX) 100 MG tablet Take 100 mg by mouth at bedtime.      [name] warfarin (COUMADIN) 4  MG tablet Take 4 mg by mouth. As directed      . warfarin (COUMADIN) 5 MG tablet Take 5 mg by mouth. As directed.       No current facility-administered medications for this visit.    Allergies not on file  Past Medical History  Diagnosis Date  . Atrial fibrillation   . Hypertension     benign  . Hiatal hernia   . Adenomatous polyp of colon   . Acute asthmatic bronchitis   . GERD (gastroesophageal reflux disease)     EGD neg 03/07/2008, but prev required dilation  . COPD (chronic obstructive pulmonary disease)   . IBS (irritable bowel syndrome)     chronic  . Urinary incontinence   . Esophagitis   . Chronic gastritis   . Duodenitis without hemorrhage 10/01/2001  . Diverticulosis   . Personal history of colonic adenomas 02/05/2008        . Stroke     x 2  . Arthritis   . Anemia   . Diabetes   . Hyperlipidemia     Past Surgical History  Procedure Laterality Date  . Cholecystectomy    . Hysterectomy/bso/bladder repair    . Breast surgery    . Colonoscopy    . Upper gi endoscopy      History    Social History  . Marital Status: Legally Separated    Spouse Name: N/A    Number of Children: N/A  . Years of Education: N/A   Occupational History  . Not on file.   Social History Main Topics  . Smoking status: Smoker, Current Status Unknown -- 2.00 packs/day    Types: Cigarettes  . Smokeless tobacco: Never Used  . Alcohol Use: No  . Drug Use: No  . Sexual Activity: Not on file   Other Topics Concern  . Not on file   Social History Narrative  . No narrative on file    Family History  Problem Relation Age of Onset  . Heart disease Mother   . Asthma Father   . Lung cancer Father   . Asthma Sister   . Heart disease Sister   . Colon cancer Father     ROS: no fevers or chills, productive cough, hemoptysis, dysphasia, odynophagia, melena, hematochezia, dysuria, hematuria, rash, seizure activity, orthopnea, PND, pedal edema, claudication. Remaining systems are negative.  Physical Exam:   There were no vitals taken for this visit.  General:  Well developed/well nourished in NAD Skin warm/dry Patient not depressed No peripheral clubbing Back-normal HEENT-normal/normal eyelids Neck supple/normal carotid upstroke bilaterally; no bruits; no JVD; no thyromegaly chest - CTA/ normal expansion CV - RRR/normal S1 and S2; no murmurs, rubs or gallops;  PMI nondisplaced Abdomen -NT/ND, no HSM, no mass, + bowel sounds, no bruit 2+ femoral pulses, no bruits Ext-no edema, chords, 2+ DP Neuro-grossly nonfocal  ECG        This encounter was created in error - please disregard.

## 2013-10-20 ENCOUNTER — Encounter: Payer: Self-pay | Admitting: Cardiology

## 2013-10-28 ENCOUNTER — Encounter: Payer: Self-pay | Admitting: Internal Medicine

## 2013-11-22 ENCOUNTER — Encounter: Payer: Self-pay | Admitting: Gastroenterology

## 2013-11-29 ENCOUNTER — Ambulatory Visit: Payer: Self-pay | Admitting: Gastroenterology

## 2013-12-07 ENCOUNTER — Emergency Department (HOSPITAL_COMMUNITY): Payer: Medicaid Other

## 2013-12-07 ENCOUNTER — Inpatient Hospital Stay (HOSPITAL_COMMUNITY)
Admission: EM | Admit: 2013-12-07 | Discharge: 2013-12-09 | DRG: 309 | Disposition: A | Discharge status: Home / Self Care | Payer: Medicaid Other | Attending: Internal Medicine | Admitting: Internal Medicine

## 2013-12-07 ENCOUNTER — Encounter (HOSPITAL_COMMUNITY): Payer: Self-pay | Admitting: Radiology

## 2013-12-07 DIAGNOSIS — R079 Chest pain, unspecified: Secondary | ICD-10-CM

## 2013-12-07 DIAGNOSIS — R200 Anesthesia of skin: Secondary | ICD-10-CM

## 2013-12-07 DIAGNOSIS — E119 Type 2 diabetes mellitus without complications: Secondary | ICD-10-CM | POA: Diagnosis present

## 2013-12-07 DIAGNOSIS — I359 Nonrheumatic aortic valve disorder, unspecified: Secondary | ICD-10-CM

## 2013-12-07 DIAGNOSIS — Z8673 Personal history of transient ischemic attack (TIA), and cerebral infarction without residual deficits: Secondary | ICD-10-CM

## 2013-12-07 DIAGNOSIS — Z79899 Other long term (current) drug therapy: Secondary | ICD-10-CM

## 2013-12-07 DIAGNOSIS — E785 Hyperlipidemia, unspecified: Secondary | ICD-10-CM | POA: Diagnosis present

## 2013-12-07 DIAGNOSIS — R0989 Other specified symptoms and signs involving the circulatory and respiratory systems: Secondary | ICD-10-CM

## 2013-12-07 DIAGNOSIS — R1310 Dysphagia, unspecified: Secondary | ICD-10-CM

## 2013-12-07 DIAGNOSIS — I10 Essential (primary) hypertension: Secondary | ICD-10-CM | POA: Diagnosis present

## 2013-12-07 DIAGNOSIS — K2940 Chronic atrophic gastritis without bleeding: Secondary | ICD-10-CM | POA: Diagnosis present

## 2013-12-07 DIAGNOSIS — R0602 Shortness of breath: Secondary | ICD-10-CM

## 2013-12-07 DIAGNOSIS — Z825 Family history of asthma and other chronic lower respiratory diseases: Secondary | ICD-10-CM

## 2013-12-07 DIAGNOSIS — K219 Gastro-esophageal reflux disease without esophagitis: Secondary | ICD-10-CM | POA: Diagnosis present

## 2013-12-07 DIAGNOSIS — Z7901 Long term (current) use of anticoagulants: Secondary | ICD-10-CM

## 2013-12-07 DIAGNOSIS — I4891 Unspecified atrial fibrillation: Secondary | ICD-10-CM | POA: Diagnosis present

## 2013-12-07 DIAGNOSIS — J4489 Other specified chronic obstructive pulmonary disease: Secondary | ICD-10-CM | POA: Diagnosis present

## 2013-12-07 DIAGNOSIS — R002 Palpitations: Secondary | ICD-10-CM

## 2013-12-07 DIAGNOSIS — G4733 Obstructive sleep apnea (adult) (pediatric): Secondary | ICD-10-CM | POA: Diagnosis present

## 2013-12-07 DIAGNOSIS — Z91040 Latex allergy status: Secondary | ICD-10-CM

## 2013-12-07 DIAGNOSIS — Z801 Family history of malignant neoplasm of trachea, bronchus and lung: Secondary | ICD-10-CM

## 2013-12-07 DIAGNOSIS — J449 Chronic obstructive pulmonary disease, unspecified: Secondary | ICD-10-CM | POA: Diagnosis present

## 2013-12-07 DIAGNOSIS — G459 Transient cerebral ischemic attack, unspecified: Secondary | ICD-10-CM | POA: Diagnosis present

## 2013-12-07 DIAGNOSIS — Z7982 Long term (current) use of aspirin: Secondary | ICD-10-CM

## 2013-12-07 DIAGNOSIS — K449 Diaphragmatic hernia without obstruction or gangrene: Secondary | ICD-10-CM | POA: Diagnosis present

## 2013-12-07 DIAGNOSIS — E669 Obesity, unspecified: Secondary | ICD-10-CM

## 2013-12-07 DIAGNOSIS — Z86010 Personal history of colonic polyps: Secondary | ICD-10-CM

## 2013-12-07 DIAGNOSIS — F17200 Nicotine dependence, unspecified, uncomplicated: Secondary | ICD-10-CM | POA: Diagnosis present

## 2013-12-07 DIAGNOSIS — J209 Acute bronchitis, unspecified: Secondary | ICD-10-CM

## 2013-12-07 DIAGNOSIS — I519 Heart disease, unspecified: Secondary | ICD-10-CM | POA: Diagnosis present

## 2013-12-07 DIAGNOSIS — K589 Irritable bowel syndrome without diarrhea: Secondary | ICD-10-CM | POA: Diagnosis present

## 2013-12-07 DIAGNOSIS — Z800 Family history of malignant neoplasm of digestive organs: Secondary | ICD-10-CM

## 2013-12-07 DIAGNOSIS — Z8249 Family history of ischemic heart disease and other diseases of the circulatory system: Secondary | ICD-10-CM

## 2013-12-07 DIAGNOSIS — E079 Disorder of thyroid, unspecified: Secondary | ICD-10-CM

## 2013-12-07 HISTORY — DX: Other localized visual field defect, right eye: H53.451

## 2013-12-07 LAB — URINALYSIS, ROUTINE W REFLEX MICROSCOPIC
Glucose, UA: NEGATIVE mg/dL
Leukocytes, UA: NEGATIVE
Nitrite: NEGATIVE
Protein, ur: NEGATIVE mg/dL
Specific Gravity, Urine: 1.012 (ref 1.005–1.030)
Urobilinogen, UA: 1 mg/dL (ref 0.0–1.0)

## 2013-12-07 LAB — CBC
HCT: 34.2 % — ABNORMAL LOW (ref 36.0–46.0)
Hemoglobin: 10.2 g/dL — ABNORMAL LOW (ref 12.0–15.0)
MCHC: 29.8 g/dL — ABNORMAL LOW (ref 30.0–36.0)
RBC: 4.43 MIL/uL (ref 3.87–5.11)
WBC: 9.4 10*3/uL (ref 4.0–10.5)

## 2013-12-07 LAB — TROPONIN I: Troponin I: <0.3 ng/mL (ref <0.30)

## 2013-12-07 LAB — COMPREHENSIVE METABOLIC PANEL
ALT: 10 U/L (ref 0–35)
AST: 17 U/L (ref 0–37)
Albumin: 3.3 g/dL — ABNORMAL LOW (ref 3.5–5.2)
Alkaline Phosphatase: 74 U/L (ref 39–117)
BUN: 18 mg/dL (ref 6–23)
CO2: 26 mEq/L (ref 19–32)
Chloride: 102 mEq/L (ref 96–112)
GFR calc non Af Amer: 46 mL/min — ABNORMAL LOW (ref >90)
Potassium: 3.6 mEq/L (ref 3.5–5.1)
Sodium: 139 mEq/L (ref 135–145)
Total Bilirubin: 0.4 mg/dL (ref 0.3–1.2)
Total Protein: 7.1 g/dL (ref 6.0–8.3)

## 2013-12-07 LAB — DIFFERENTIAL
Basophils Relative: 0 % (ref 0–1)
Lymphocytes Relative: 35 % (ref 12–46)
Monocytes Absolute: 0.9 10*3/uL (ref 0.1–1.0)
Monocytes Relative: 10 % (ref 3–12)
Neutro Abs: 4.9 10*3/uL (ref 1.7–7.7)
Neutrophils Relative %: 52 % (ref 43–77)

## 2013-12-07 LAB — ETHANOL: Alcohol, Ethyl (B): <11 mg/dL (ref 0–11)

## 2013-12-07 LAB — RAPID URINE DRUG SCREEN, HOSP PERFORMED
Amphetamines: NOT DETECTED
Barbiturates: NOT DETECTED
Benzodiazepines: NOT DETECTED
Tetrahydrocannabinol: NOT DETECTED

## 2013-12-07 LAB — PROTIME-INR: INR: 2.57 — ABNORMAL HIGH (ref 0.00–1.49)

## 2013-12-07 LAB — APTT: aPTT: 45 seconds — ABNORMAL HIGH (ref 24–37)

## 2013-12-07 LAB — POCT I-STAT, CHEM 8
Glucose, Bld: 117 mg/dL — ABNORMAL HIGH (ref 70–99)
HCT: 36 % (ref 36.0–46.0)
Hemoglobin: 12.2 g/dL (ref 12.0–15.0)
Sodium: 140 mEq/L (ref 135–145)
TCO2: 26 mmol/L (ref 0–100)

## 2013-12-07 LAB — POCT I-STAT TROPONIN I: Troponin i, poc: 0 ng/mL (ref 0.00–0.08)

## 2013-12-07 LAB — GLUCOSE, CAPILLARY: Glucose-Capillary: 116 mg/dL — ABNORMAL HIGH (ref 70–99)

## 2013-12-07 MED ORDER — SODIUM CHLORIDE 0.9 % IV BOLUS (SEPSIS)
500.0000 mL | Freq: Once | INTRAVENOUS | Status: AC
  Administered 2013-12-07: 1000 mL via INTRAVENOUS

## 2013-12-07 MED ORDER — METOPROLOL TARTRATE 25 MG PO TABS
25.0000 mg | ORAL_TABLET | Freq: Once | ORAL | Status: AC
  Administered 2013-12-08: 25 mg via ORAL
  Filled 2013-12-07: qty 1

## 2013-12-07 MED ORDER — DILTIAZEM HCL 25 MG/5ML IV SOLN: 10.0000 mg | Freq: Once | INTRAVENOUS | Status: DC

## 2013-12-07 MED ORDER — DILTIAZEM HCL 100 MG IV SOLR
5.0000 mg/h | INTRAVENOUS | Status: DC
  Administered 2013-12-07: 10 mg/h via INTRAVENOUS
  Administered 2013-12-07: 5 mg/h via INTRAVENOUS
  Administered 2013-12-08: 15 mg/h via INTRAVENOUS
  Filled 2013-12-07: qty 100

## 2013-12-07 NOTE — ED Provider Notes (Signed)
CSN: [id number]     Arrival date & time 12/07/13  2012 History   First MD Initiated Contact with Patient 12/07/13 2029     Chief Complaint  Patient presents with  . Atrial Fibrillation   (Consider location/radiation/quality/duration/timing/severity/associated sxs/prior Treatment) HPI Comments: 62 yo female brought to ED by EMS after having multiple episodes (up to 30 min in length) of left arm then leg and face numbness. No associated weakness, headaches, neck pain or chest pain. Resolved on its own. By the time EMS arrived, patient was asymptomatic but found to have Afib with RVR over 130 (known history of Afib). Given 20 mg cardizem in route with some success. Patient does not feel symptomatic from this. Has had a prior right sided stroke but is asymptomatic.    Past Medical History  Diagnosis Date  . Atrial fibrillation   . Hypertension     benign  . Hiatal hernia   . Adenomatous polyp of colon   . Acute asthmatic bronchitis   . GERD (gastroesophageal reflux disease)     EGD neg 03/07/2008, but prev required dilation  . COPD (chronic obstructive pulmonary disease)   . IBS (irritable bowel syndrome)     chronic  . Urinary incontinence   . Esophagitis   . Chronic gastritis   . Duodenitis without hemorrhage 10/01/2001  . Diverticulosis   . Personal history of colonic adenomas 02/05/2008        . Stroke     x 2  . Arthritis   . Anemia   . Diabetes   . Hyperlipidemia    Past Surgical History  Procedure Laterality Date  . Cholecystectomy    . Hysterectomy/bso/bladder repair    . Breast surgery    . Colonoscopy    . Upper gi endoscopy     Family History  Problem Relation Age of Onset  . Heart disease Mother   . Asthma Father   . Lung cancer Father   . Asthma Sister   . Heart disease Sister   . Colon cancer Father    History  Substance Use Topics  . Smoking status: Smoker, Current Status Unknown -- 2.00 packs/day    Types: Cigarettes  . Smokeless tobacco: Never Used   . Alcohol Use: No   OB History   Grav Para Term Preterm Abortions TAB SAB Ect Mult Living                 Review of Systems  Constitutional: Negative for fever.  Respiratory: Negative for cough and shortness of breath.   Cardiovascular: Negative for chest pain and palpitations.  Gastrointestinal: Negative for vomiting and abdominal pain.  Neurological: Positive for numbness. Negative for weakness, light-headedness and headaches.  All other systems reviewed and are negative.    Allergies  Review of patient's allergies indicates not on file.  Home Medications   Current Outpatient Rx  Name  Route  Sig  Dispense  Refill  . ALPRAZolam (XANAX) 0.5 MG tablet   Oral   Take 0.5 mg by mouth 2 (two) times daily as needed.           [name] amLODipine (NORVASC) 5 MG tablet   Oral   Take 5 mg by mouth daily.           [name] aspirin 325 MG tablet   Oral   Take 325 mg by mouth daily.           . cetirizine (ZYRTEC) 10 MG tablet   Oral  Take 10 mg by mouth daily.         . citalopram (CELEXA) 40 MG tablet   Oral   Take 40 mg by mouth daily.           [name] diltiazem (CARDIZEM) 120 MG tablet   Oral   Take 120 mg by mouth daily.         . DULoxetine (CYMBALTA) 60 MG capsule   Oral   Take 120 mg by mouth daily.         [name] EPINEPHrine (EPIPEN JR) 0.15 MG/0.3ML injection   Intramuscular   Inject 0.15 mg into the muscle as needed for anaphylaxis.         [name] Fluticasone-Salmeterol (ADVAIR DISKUS) 250-50 MCG/DOSE AEPB   Inhalation   Inhale 1 puff into the lungs as directed.           . furosemide (LASIX) 40 MG tablet   Oral   Take 40 mg by mouth daily.           [name] glucose blood test strip   Other   1 each by Other route as needed for other. Use as instructed         . ibuprofen (ADVIL,MOTRIN) 800 MG tablet   Oral   Take 800 mg by mouth every 8 (eight) hours as needed.           [name] ipratropium-albuterol (DUONEB) 0.5-2.5 (3) MG/3ML SOLN   Nebulization   Take  3 mLs by nebulization every 6 (six) hours as needed.           . levalbuterol (XOPENEX HFA) 45 MCG/ACT inhaler   Inhalation   Inhale 1-2 puffs into the lungs every 4 (four) hours as needed.           . metFORMIN (GLUMETZA) 1000 MG (MOD) 24 hr tablet   Oral   Take 1,000 mg by mouth 2 (two) times daily with a meal.         . metoprolol succinate (TOPROL-XL) 25 MG 24 hr tablet   Oral   Take 25 mg by mouth at bedtime.         [name] olmesartan-hydrochlorothiazide (BENICAR HCT) 20-12.5 MG per tablet   Oral   Take 1 tablet by mouth daily.           [name] omeprazole (PRILOSEC) 40 MG capsule   Oral   Take 40 mg by mouth 2 (two) times daily.           . simvastatin (ZOCOR) 40 MG tablet   Oral   Take 40 mg by mouth at bedtime.           . solifenacin (VESICARE) 10 MG tablet   Oral   Take 5 mg by mouth daily.           [name] tiotropium (SPIRIVA) 18 MCG inhalation capsule   Inhalation   Place 18 mcg into inhaler and inhale as directed.           . triamcinolone cream (KENALOG) 0.1 %   Topical   Apply 1 application topically 2 (two) times daily.         [name] trimethoprim (TRIMPEX) 100 MG tablet   Oral   Take 100 mg by mouth at bedtime.         [name] warfarin (COUMADIN) 4 MG tablet   Oral   Take 4 mg by mouth. As directed         . warfarin (COUMADIN) 5 MG  tablet   Oral   Take 5 mg by mouth. As directed.          BP 93/62  Pulse 56  Resp 20  SpO2 98% Physical Exam  Nursing note and vitals reviewed. Constitutional: She is oriented to person, place, and time. She appears well-developed and well-nourished. No distress.  HENT:  Head: Normocephalic and atraumatic.  Right Ear: External ear normal.  Left Ear: External ear normal.  Nose: Nose normal.  Eyes: EOM are normal. Pupils are equal, round, and reactive to light. Right eye exhibits no discharge. Left eye exhibits no discharge.  Neck: Neck supple.  Cardiovascular: Normal rate, normal heart sounds and intact  distal pulses.  An irregularly irregular rhythm present.  Pulmonary/Chest: Effort normal and breath sounds normal.  Abdominal: Soft. There is no tenderness.  Neurological: She is alert and oriented to person, place, and time. She has normal strength. No cranial nerve deficit or sensory deficit. GCS eye subscore is 4. GCS verbal subscore is 5. GCS motor subscore is 6.  Skin: Skin is warm and dry.    ED Course  Procedures (including critical care time) Labs Review Labs Reviewed  PROTIME-INR - Abnormal; Notable for the following:    Prothrombin Time 26.7 (*)    INR 2.57 (*)    All other components within normal limits  APTT - Abnormal; Notable for the following:    aPTT 45 (*)    All other components within normal limits  CBC - Abnormal; Notable for the following:    Hemoglobin 10.2 (*)    HCT 34.2 (*)    MCV 77.2 (*)    MCH 23.0 (*)    MCHC 29.8 (*)    RDW 17.5 (*)    All other components within normal limits  COMPREHENSIVE METABOLIC PANEL - Abnormal; Notable for the following:    Glucose, Bld 116 (*)    Creatinine, Ser 1.23 (*)    Albumin 3.3 (*)    GFR calc non Af Amer 46 (*)    GFR calc Af Amer 53 (*)    All other components within normal limits  URINE RAPID DRUG SCREEN (HOSP PERFORMED) - Abnormal; Notable for the following:    Opiates POSITIVE (*)    All other components within normal limits  URINALYSIS, ROUTINE W REFLEX MICROSCOPIC - Abnormal; Notable for the following:    APPearance CLOUDY (*)    All other components within normal limits  GLUCOSE, CAPILLARY - Abnormal; Notable for the following:    Glucose-Capillary 116 (*)    All other components within normal limits  POCT I-STAT, CHEM 8 - Abnormal; Notable for the following:    Creatinine, Ser 1.50 (*)    Glucose, Bld 117 (*)    Calcium, Ion 1.10 (*)    All other components within normal limits  ETHANOL  DIFFERENTIAL  TROPONIN I  POCT I-STAT TROPONIN I   Imaging Review Ct Head Wo Contrast  12/07/2013    CLINICAL DATA:  Left arm numbness for 2 hours.  Tachycardia.  EXAM: CT HEAD WITHOUT CONTRAST  TECHNIQUE: Contiguous axial images were obtained from the base of the skull through the vertex without intravenous contrast.  COMPARISON:  MRI of the brain performed 07/25/2005  FINDINGS: There is no evidence of acute infarction, mass lesion, or intra- or extra-axial hemorrhage on CT.  Mild periventricular white matter change likely reflects small vessel ischemic microangiopathy. Prominence of the occipital horn of the left lateral ventricle likely reflects ex vacuo dilatation due  to underlying encephalomalacia, reflecting remote infarct.  The posterior fossa, including the cerebellum, brainstem and fourth ventricle, is within normal limits. The third ventricle and basal ganglia are unremarkable in appearance. No mass effect or midline shift is seen.  There is no evidence of fracture; visualized osseous structures are unremarkable in appearance. The orbits are within normal limits. The paranasal sinuses and mastoid air cells are well-aerated. No significant soft tissue abnormalities are seen.  IMPRESSION: 1. No acute intracranial pathology seen on CT. 2. Mild small vessel ischemic microangiopathy. 3. 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   Electronically Signed   By: Roanna Raider M.D.   On: 12/07/2013 22:01   Dg Chest Portable 1 View  12/07/2013   CLINICAL DATA:  Atrial fibrillation.  EXAM: PORTABLE CHEST - 1 VIEW  COMPARISON:  One-view chest 06/12/2012.  FINDINGS: The heart size is normal. The lungs are clear. The visualized soft tissues and bony thorax are unremarkable.  IMPRESSION: Negative one-view chest.   Electronically Signed   By: Gennette Pac M.D.   On: 12/07/2013 21:07    EKG Interpretation   None       MDM   1. Atrial fibrillation with RVR   2. Numbness    HR  somewhat controlled with diltiazem bolus and drip. Patient denies chest sx. Numbness resolved PTA and has normal neuro exam, most c/w TIA and not a candidate for code stroke. Cards consulted, recommend oral metoprolol and continuing drip. Due to her sx will admit to hospitalist for further stroke w/u and HR control.    Audree Camel, MD 12/08/13 [phone]

## 2013-12-07 NOTE — ED Notes (Signed)
Pt pass swallow study

## 2013-12-07 NOTE — ED Notes (Signed)
Per EMS: pt coming from home with c/o A-fib with RVR. Initial c/o left arm numbcess for two hours. HR between 120-180. Pt denies chest pain, shortness of breath. Pt is A&Ox4, respirations equal and unlabored, skin warm and dry. Pt was given a total of 20 mg of Cardizem.

## 2013-12-07 NOTE — ED Notes (Signed)
MD at bedside.ER doctor aware of Patient diltizem rate 15mg /hr consult with cardiology

## 2013-12-07 NOTE — ED Notes (Signed)
C/os of numbness in left jaw.

## 2013-12-07 NOTE — ED Notes (Signed)
Patient transported back from CT 

## 2013-12-07 NOTE — ED Notes (Signed)
CBG-116. Notified RN 

## 2013-12-08 ENCOUNTER — Inpatient Hospital Stay (HOSPITAL_COMMUNITY): Payer: Medicaid Other

## 2013-12-08 ENCOUNTER — Encounter (HOSPITAL_COMMUNITY): Payer: Self-pay | Admitting: Internal Medicine

## 2013-12-08 DIAGNOSIS — G459 Transient cerebral ischemic attack, unspecified: Secondary | ICD-10-CM | POA: Diagnosis present

## 2013-12-08 DIAGNOSIS — I517 Cardiomegaly: Secondary | ICD-10-CM

## 2013-12-08 DIAGNOSIS — E119 Type 2 diabetes mellitus without complications: Secondary | ICD-10-CM

## 2013-12-08 DIAGNOSIS — R209 Unspecified disturbances of skin sensation: Secondary | ICD-10-CM

## 2013-12-08 DIAGNOSIS — J449 Chronic obstructive pulmonary disease, unspecified: Secondary | ICD-10-CM

## 2013-12-08 LAB — GLUCOSE, CAPILLARY
Glucose-Capillary: 107 mg/dL — ABNORMAL HIGH (ref 70–99)
Glucose-Capillary: 133 mg/dL — ABNORMAL HIGH (ref 70–99)
Glucose-Capillary: 180 mg/dL — ABNORMAL HIGH (ref 70–99)
Glucose-Capillary: 112 mg/dL — ABNORMAL HIGH (ref 70–99)

## 2013-12-08 LAB — MRSA PCR SCREENING: MRSA by PCR: NEGATIVE

## 2013-12-08 LAB — TSH: TSH: 1.556 u[IU]/mL (ref 0.350–4.500)

## 2013-12-08 LAB — LIPID PANEL
LDL Cholesterol: 79 mg/dL (ref 0–99)
Total CHOL/HDL Ratio: 3.5 RATIO
Triglycerides: 128 mg/dL (ref <150)
VLDL: 26 mg/dL (ref 0–40)

## 2013-12-08 LAB — TROPONIN I
Troponin I: <0.3 ng/mL (ref <0.30)
Troponin I: <0.3 ng/mL (ref <0.30)

## 2013-12-08 LAB — PROTIME-INR
INR: 2.59 — ABNORMAL HIGH (ref 0.00–1.49)
Prothrombin Time: 26.9 seconds — ABNORMAL HIGH (ref 11.6–15.2)

## 2013-12-08 LAB — T4, FREE: Free T4: 1.19 ng/dL (ref 0.80–1.80)

## 2013-12-08 LAB — T3, FREE: T3, Free: 3 pg/mL (ref 2.3–4.2)

## 2013-12-08 MED ORDER — DULOXETINE HCL 60 MG PO CPEP
120.0000 mg | ORAL_CAPSULE | Freq: Every day | ORAL | Status: DC
  Administered 2013-12-08 – 2013-12-09 (×2): 120 mg via ORAL
  Filled 2013-12-08 (×2): qty 2

## 2013-12-08 MED ORDER — MOMETASONE FURO-FORMOTEROL FUM 100-5 MCG/ACT IN AERO
2.0000 | INHALATION_SPRAY | Freq: Two times a day (BID) | RESPIRATORY_TRACT | Status: DC
  Administered 2013-12-08 – 2013-12-09 (×3): 2 via RESPIRATORY_TRACT
  Filled 2013-12-08: qty 8.8

## 2013-12-08 MED ORDER — DILTIAZEM HCL ER COATED BEADS 180 MG PO CP24
180.0000 mg | ORAL_CAPSULE | Freq: Every day | ORAL | Status: DC
  Administered 2013-12-08 – 2013-12-09 (×2): 180 mg via ORAL
  Filled 2013-12-08 (×2): qty 1

## 2013-12-08 MED ORDER — ATORVASTATIN CALCIUM 20 MG PO TABS: 20.0000 mg | ORAL_TABLET | Freq: Every day | ORAL | Status: DC

## 2013-12-08 MED ORDER — INSULIN ASPART 100 UNIT/ML ~~LOC~~ SOLN
0.0000 [IU] | Freq: Three times a day (TID) | SUBCUTANEOUS | Status: DC
  Administered 2013-12-08: 2 [IU] via SUBCUTANEOUS
  Administered 2013-12-08 – 2013-12-09 (×2): 1 [IU] via SUBCUTANEOUS

## 2013-12-08 MED ORDER — PANTOPRAZOLE SODIUM 40 MG PO TBEC
40.0000 mg | DELAYED_RELEASE_TABLET | Freq: Every day | ORAL | Status: DC
  Administered 2013-12-08 – 2013-12-09 (×2): 40 mg via ORAL
  Filled 2013-12-08: qty 1

## 2013-12-08 MED ORDER — ATORVASTATIN CALCIUM 40 MG PO TABS: 40.0000 mg | ORAL_TABLET | Freq: Every day | ORAL | Status: DC

## 2013-12-08 MED ORDER — TRIMETHOPRIM 100 MG PO TABS
100.0000 mg | ORAL_TABLET | Freq: Every day | ORAL | Status: DC
  Administered 2013-12-08: 100 mg via ORAL
  Filled 2013-12-08 (×2): qty 1

## 2013-12-08 MED ORDER — ATORVASTATIN CALCIUM 20 MG PO TABS
20.0000 mg | ORAL_TABLET | Freq: Every day | ORAL | Status: DC
  Administered 2013-12-08: 20 mg via ORAL
  Filled 2013-12-08: qty 1

## 2013-12-08 MED ORDER — METOPROLOL SUCCINATE ER 25 MG PO TB24
25.0000 mg | ORAL_TABLET | Freq: Every morning | ORAL | Status: DC
  Filled 2013-12-08: qty 1

## 2013-12-08 MED ORDER — ACETAMINOPHEN 650 MG RE SUPP: 650.0000 mg | Freq: Four times a day (QID) | RECTAL | Status: DC | PRN

## 2013-12-08 MED ORDER — SIMVASTATIN 40 MG PO TABS
40.0000 mg | ORAL_TABLET | Freq: Every morning | ORAL | Status: DC
  Filled 2013-12-08: qty 1

## 2013-12-08 MED ORDER — SODIUM CHLORIDE 0.9 % IJ SOLN
3.0000 mL | Freq: Two times a day (BID) | INTRAMUSCULAR | Status: DC
  Administered 2013-12-08 – 2013-12-09 (×2): 3 mL via INTRAVENOUS

## 2013-12-08 MED ORDER — ONDANSETRON HCL 4 MG PO TABS: 4.0000 mg | ORAL_TABLET | Freq: Four times a day (QID) | ORAL | Status: DC | PRN

## 2013-12-08 MED ORDER — LEVALBUTEROL TARTRATE 45 MCG/ACT IN AERO
2.0000 | INHALATION_SPRAY | Freq: Four times a day (QID) | RESPIRATORY_TRACT | Status: DC | PRN
Start: 2013-12-08 — End: 2013-12-09

## 2013-12-08 MED ORDER — HYDROCODONE-ACETAMINOPHEN 7.5-325 MG PO TABS
1.0000 | ORAL_TABLET | Freq: Four times a day (QID) | ORAL | Status: DC | PRN
Start: 2013-12-08 — End: 2013-12-09

## 2013-12-08 MED ORDER — ONDANSETRON HCL 4 MG/2ML IJ SOLN: 4.0000 mg | Freq: Four times a day (QID) | INTRAMUSCULAR | Status: DC | PRN

## 2013-12-08 MED ORDER — ALPRAZOLAM 0.5 MG PO TABS
0.5000 mg | ORAL_TABLET | Freq: Every day | ORAL | Status: DC
  Administered 2013-12-08: 0.5 mg via ORAL
  Filled 2013-12-08: qty 1

## 2013-12-08 MED ORDER — LORATADINE 10 MG PO TABS
10.0000 mg | ORAL_TABLET | Freq: Every day | ORAL | Status: DC
  Administered 2013-12-08 – 2013-12-09 (×2): 10 mg via ORAL
  Filled 2013-12-08 (×2): qty 1

## 2013-12-08 MED ORDER — PERFLUTREN LIPID MICROSPHERE: 1.0000 mL | INTRAVENOUS | Status: AC | PRN

## 2013-12-08 MED ORDER — IRBESARTAN 150 MG PO TABS
150.0000 mg | ORAL_TABLET | Freq: Every day | ORAL | Status: DC
  Administered 2013-12-08 – 2013-12-09 (×2): 150 mg via ORAL
  Filled 2013-12-08 (×2): qty 1

## 2013-12-08 MED ORDER — SIMVASTATIN 40 MG PO TABS: 40.0000 mg | ORAL_TABLET | Freq: Every day | ORAL | Status: DC

## 2013-12-08 MED ORDER — WARFARIN SODIUM 4 MG PO TABS
4.0000 mg | ORAL_TABLET | Freq: Every day | ORAL | Status: DC
  Administered 2013-12-08: 4 mg via ORAL
  Filled 2013-12-08 (×2): qty 1

## 2013-12-08 MED ORDER — TIOTROPIUM BROMIDE MONOHYDRATE 18 MCG IN CAPS
18.0000 ug | ORAL_CAPSULE | Freq: Every day | RESPIRATORY_TRACT | Status: DC
  Administered 2013-12-08 – 2013-12-09 (×2): 18 ug via RESPIRATORY_TRACT
  Filled 2013-12-08: qty 5

## 2013-12-08 MED ORDER — ACETAMINOPHEN 325 MG PO TABS: 650.0000 mg | ORAL_TABLET | Freq: Four times a day (QID) | ORAL | Status: DC | PRN

## 2013-12-08 MED ORDER — ATORVASTATIN CALCIUM 20 MG PO TABS
20.0000 mg | ORAL_TABLET | Freq: Every day | ORAL | Status: DC
  Filled 2013-12-08: qty 1

## 2013-12-08 MED ORDER — SODIUM CHLORIDE 0.9 % IJ SOLN: 3.0000 mL | Freq: Two times a day (BID) | INTRAMUSCULAR | Status: DC

## 2013-12-08 MED ORDER — DARIFENACIN HYDROBROMIDE ER 7.5 MG PO TB24
7.5000 mg | ORAL_TABLET | Freq: Every day | ORAL | Status: DC
  Administered 2013-12-08 – 2013-12-09 (×2): 7.5 mg via ORAL
  Filled 2013-12-08 (×2): qty 1

## 2013-12-08 MED ORDER — WARFARIN - PHARMACIST DOSING INPATIENT
Freq: Every day | Status: DC
  Administered 2013-12-08: 18:00:00

## 2013-12-08 MED ORDER — PERFLUTREN LIPID MICROSPHERE
INTRAVENOUS | Status: AC
  Administered 2013-12-08: 2 mL
  Filled 2013-12-08: qty 10

## 2013-12-08 NOTE — H&P (Signed)
[facility] History and Physical  Colleen [name] ZOX:[id number] DOB: 16-Feb-1951 DOA: 12/07/2013  Referring physician: ER physician. PCP: Dema Severin, NP   Chief Complaint: Tingling and numbness of the left extremities.  HPI: Colleen [name] is a 62 y.o. female with history of atrial fibrillation, COPD, hypertension, ongoing tobacco abuse started experiencing left upper and lower extremity tingling and numbness around 5:30 PM which lasted for about half an hour and resolved. Later on 30 minutes later patient started experiencing tingling and numbness of the left side of the face which also resolved by 30 minutes but since patient was concerned patient came to the ER. On exam patient was completely nonfocal and her symptoms had resolved. CTA did not show anything acute. In the ER patient was found to be in atrial fibrillation with RVR. Patient was started on Cardizem infusion. Cardiology was consulted. Patient's INR was therapeutic. Patient otherwise denies any shortness of breath or chest pain. Patient states that a few years ago she did have a stroke which left her with mild weakness in her memory and decreased peripheral vision.   Review of Systems: As presented in the history of presenting illness, rest negative.  Past Medical History  Diagnosis Date  . Atrial fibrillation   . Hypertension     benign  . Hiatal hernia   . Adenomatous polyp of colon   . Acute asthmatic bronchitis   . GERD (gastroesophageal reflux disease)     EGD neg 03/07/2008, but prev required dilation  . COPD (chronic obstructive pulmonary disease)   . IBS (irritable bowel syndrome)     chronic  . Urinary incontinence   . Esophagitis   . Chronic gastritis   . Duodenitis without hemorrhage 10/01/2001  . Diverticulosis   . Personal history of colonic adenomas 02/05/2008        . Stroke     x 2  . Arthritis   . Anemia   . Diabetes   . Hyperlipidemia    Past Surgical History  Procedure Laterality Date  .  Cholecystectomy    . Hysterectomy/bso/bladder repair    . Breast surgery    . Colonoscopy    . Upper gi endoscopy     Social History:  reports that she has been smoking Cigarettes.  She has been smoking about 2.00 packs per day. She has never used smokeless tobacco. She reports that she does not drink alcohol or use illicit drugs. Where does patient live home. Can patient participate in ADLs? Yes.  Allergies  Allergen Reactions  . Latex Hives    Family History:  Family History  Problem Relation Age of Onset  . Heart disease Mother   . Asthma Father   . Lung cancer Father   . Asthma Sister   . Heart disease Sister   . Colon cancer Father       Prior to Admission medications   Medication Sig Start Date End Date Taking? Authorizing Provider  albuterol (PROVENTIL) (2.5 MG/3ML) 0.083% nebulizer solution Take 2.5 mg by nebulization 2 (two) times daily.    Yes Historical Provider, MD  ALPRAZolam Prudy Feeler) 0.5 MG tablet Take 0.5 mg by mouth at bedtime.    Yes Historical Provider, MD  cetirizine (ZYRTEC) 10 MG tablet Take 10 mg by mouth at bedtime.    Yes Historical Provider, MD  DULoxetine (CYMBALTA) 60 MG capsule Take 120 mg by mouth daily.   Yes Historical Provider, MD  Fluticasone-Salmeterol (ADVAIR DISKUS) 250-50 MCG/DOSE AEPB Inhale 1 puff into  the lungs 2 (two) times daily.    Yes Historical Provider, MD  furosemide (LASIX) 40 MG tablet Take 40 mg by mouth daily.     Yes Historical Provider, MD  HYDROcodone-acetaminophen (NORCO) 7.5-325 MG per tablet Take 1 tablet by mouth every 6 (six) hours as needed for moderate pain.   Yes Historical Provider, MD  ibuprofen (ADVIL,MOTRIN) 800 MG tablet Take 800 mg by mouth every 8 (eight) hours as needed.     Yes Historical Provider, MD  levalbuterol [facility] HFA) 45 MCG/ACT inhaler Inhale 2 puffs into the lungs every 4 (four) hours as needed.    Yes Historical Provider, MD  metFORMIN (GLUMETZA) 1000 MG (MOD) 24 hr tablet Take 1,000 mg by mouth 2  (two) times daily with a meal.   Yes Historical Provider, MD  metoprolol succinate (TOPROL-XL) 25 MG 24 hr tablet Take 25 mg by mouth every morning.    Yes Historical Provider, MD  olmesartan-hydrochlorothiazide (BENICAR HCT) 20-12.5 MG per tablet Take 1 tablet by mouth daily.     Yes Historical Provider, MD  omeprazole (PRILOSEC) 40 MG capsule Take 40 mg by mouth 2 (two) times daily.     Yes Historical Provider, MD  simvastatin (ZOCOR) 40 MG tablet Take 40 mg by mouth every morning.    Yes Historical Provider, MD  solifenacin (VESICARE) 10 MG tablet Take 10 mg by mouth daily.   Yes Historical Provider, MD  tiotropium (SPIRIVA) 18 MCG inhalation capsule Place 18 mcg into inhaler and inhale daily.    Yes Historical Provider, MD  trimethoprim (TRIMPEX) 100 MG tablet Take 100 mg by mouth at bedtime.   Yes Historical Provider, MD  warfarin (COUMADIN) 4 MG tablet Take 4 mg by mouth every morning. As directed   Yes Historical Provider, MD  glucose blood test strip 1 each by Other route as needed for other. Use as instructed    Historical Provider, MD    Physical Exam: Filed Vitals:   12/08/13 0115 12/08/13 0122 12/08/13 0130 12/08/13 0145  BP: 114/81 114/81 137/96 121/92  Pulse: 129 128 68 124  Temp:      Resp: 20 23 20 30   SpO2: 94% 96% 95% 97%     General:  Well-developed well-nourished.  Eyes: Anicteric no pallor.  ENT: No discharge from ears eyes nose mouth.  Neck: No mass felt.  Cardiovascular: S1-S2 heard. Tachycardic. Irregular.  Respiratory: No rhonchi or crepitations.  Abdomen: Soft nontender bowel sounds present.  Skin: No rash.  Musculoskeletal: No edema.  Psychiatric: Appears normal.  Neurologic: Alert awake oriented to time place and person. Moves all extremities 5 x 5. No facial asymmetry. Tongue is midline. PERRLA positive.  Labs on Admission:  Basic Metabolic Panel:  Recent Labs Lab 12/07/13 2049 12/07/13 2108  NA 139 140  K 3.6 3.5  CL 102 99  CO2 26   --   GLUCOSE 116* 117*  BUN 18 18  CREATININE 1.23* 1.50*  CALCIUM 8.8  --    Liver Function Tests:  Recent Labs Lab 12/07/13 2049  AST 17  ALT 10  ALKPHOS 74  BILITOT 0.4  PROT 7.1  ALBUMIN 3.3*   No results found for this basename: LIPASE, AMYLASE,  in the last 168 hours No results found for this basename: AMMONIA,  in the last 168 hours CBC:  Recent Labs Lab 12/07/13 2049 12/07/13 2108  WBC 9.4  --   NEUTROABS 4.9  --   HGB 10.2* 12.2  HCT 34.2* 36.0  MCV 77.2*  --   PLT 328  --    Cardiac Enzymes:  Recent Labs Lab 12/07/13 2049  TROPONINI <0.30    BNP (last 3 results) No results found for this basename: PROBNP,  in the last 8760 hours CBG:  Recent Labs Lab 12/07/13 2108  GLUCAP 116*    Radiological Exams on Admission: Ct Head Wo Contrast  12/07/2013   CLINICAL DATA:  Left arm numbness for 2 hours.  Tachycardia.  EXAM: CT HEAD WITHOUT CONTRAST  TECHNIQUE: Contiguous axial images were obtained from the base of the skull through the vertex without intravenous contrast.  COMPARISON:  MRI of the brain performed 07/25/2005  FINDINGS: There is no evidence of acute infarction, mass lesion, or intra- or extra-axial hemorrhage on CT.  Mild periventricular white matter change likely reflects small vessel ischemic microangiopathy. Prominence of the occipital horn of the left lateral ventricle likely reflects ex vacuo dilatation due to underlying encephalomalacia, reflecting remote infarct.  The posterior fossa, including the cerebellum, brainstem and fourth ventricle, is within normal limits. The third ventricle and basal ganglia are unremarkable in appearance. No mass effect or midline shift is seen.  There is no evidence of fracture; visualized osseous structures are unremarkable in appearance. The orbits are within normal limits. The paranasal sinuses and mastoid air cells are well-aerated. No significant soft tissue abnormalities are seen.  IMPRESSION: 1. No acute  intracranial pathology seen on CT. 2. Mild small vessel ischemic microangiopathy. 3. 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   Electronically Signed   By: Roanna Raider M.D.   On: 12/07/2013 22:01   Dg Chest Portable 1 View  12/07/2013   CLINICAL DATA:  Atrial fibrillation.  EXAM: PORTABLE CHEST - 1 VIEW  COMPARISON:  One-view chest 06/12/2012.  FINDINGS: The heart size is normal. The lungs are clear. The visualized soft tissues and bony thorax are unremarkable.  IMPRESSION: Negative one-view chest.   Electronically Signed   By: Gennette Pac M.D.   On: 12/07/2013 21:07    EKG: Independently reviewed. A. fib with RVR.  Assessment/Plan Principal Problem:   Atrial fibrillation with RVR Active Problems:   HYPERLIPIDEMIA   TOBACCO ABUSE   TIA (transient ischemic attack)   Diabetes mellitus   1. A. fib with RVR - continue with Cardizem infusion and as per cardiology recommendation slowly change to by mouth metoprolol. Check thyroid function tests. 2. Left-sided tingling and numbness brief episode - discussed with on-call neurologist Dr. Thad Ranger. Since patient is already therapeutic and has satisfactory for possible TIA/CVA Dr. Thad Ranger at this time as requested to get MRI brain to make sure there was no acute infarct. I also ordered carotid Doppler. 3. Tobacco abuse - patient strongly advised to quit smoking. 4. COPD - continue home medications. 5. Hypertension - holding off diuretics for now. 6. Diabetes mellitus type 2 - check hemoglobin A1c and closely follow CBGs with sliding-scale coverage. 7. Renal failure - closely follow intake output and metabolic panel.    Code Status: Full code.  Family Communication: Family the bedside.  Disposition Plan: Admit to inpatient.    Keval Nam N. [facility] Pager [phone].  If 7PM-7AM, please contact  night-coverage [email or web address] Password [email or web address] 12/08/2013, 2:27 AM

## 2013-12-08 NOTE — Progress Notes (Signed)
  Echocardiogram 2D Echocardiogram with Definity has been performed.  Eilleen Davoli 12/08/2013, 9:03 AM

## 2013-12-08 NOTE — H&P (Addendum)
Cardiology Consultation Note  Patient ID: Colleen [name], MRN: [id number], DOB/AGE: [birth year] 62 y.o. Admit date: 12/07/2013   Date of Consult: 12/08/2013 Primary Physician: Dema Severin, NP Primary Cardiologist: Olga Millers   Chief Complaint: Afib  Reason for Consult: Afib  HPI: 62 yr old female with hx of Afib , HTN , OSA not treated, COPD on home oxygen , admitted with left sided numbness  HPI ; pt states that earlier in the day she had transient ( lasting 30 minutes ) acute onset left sided numbness./ tingling in LE , ULE  and left face. Denies any overt weakness in the extremities or  dysarthria or gait imbalance during this period. Also denies any chest pain , SOB , orthopnea, PND , LE edema , syncope ,claudcation , focal weakness, or bleeding diathesis .  In the ED she was found to  have Afib with RVR and was started on Diltiazem gtt  She last saw her cardiologist 3 yrs ago  Does not uses CPAP  Takes OAC for her Afib     Past Medical History  Diagnosis Date  . Atrial fibrillation   . Hypertension     benign  . Hiatal hernia   . Adenomatous polyp of colon   . Acute asthmatic bronchitis   . GERD (gastroesophageal reflux disease)     EGD neg 03/07/2008, but prev required dilation  . COPD (chronic obstructive pulmonary disease)   . IBS (irritable bowel syndrome)     chronic  . Urinary incontinence   . Esophagitis   . Chronic gastritis   . Duodenitis without hemorrhage 10/01/2001  . Diverticulosis   . Personal history of colonic adenomas 02/05/2008        . Stroke     x 2  . Arthritis   . Anemia   . Diabetes   . Hyperlipidemia       Most Recent Cardiac Studies:   09/2010 Overall Impression  Exercise Capacity: Lexiscan with no exercise.  BP Response: Normal blood pressure response.  Clinical Symptoms: No chest pain  ECG Impression: No significant ST segment change suggestive of ischemia.  Overall Impression: Normal stress nuclear study.  Overall Impression  Comments: No evidence of ischemia. Normal LV function.    Echo 09/2010 EF nl , AVA 1.7  Surgical History:  Past Surgical History  Procedure Laterality Date  . Cholecystectomy    . Hysterectomy/bso/bladder repair    . Breast surgery    . Colonoscopy    . Upper gi endoscopy       Home Meds: Prior to Admission medications   Medication Sig Start Date End Date Taking? Authorizing Provider  albuterol (PROVENTIL) (2.5 MG/3ML) 0.083% nebulizer solution Take 2.5 mg by nebulization 2 (two) times daily.    Yes Historical Provider, MD  ALPRAZolam Prudy Feeler) 0.5 MG tablet Take 0.5 mg by mouth at bedtime.    Yes Historical Provider, MD  cetirizine (ZYRTEC) 10 MG tablet Take 10 mg by mouth at bedtime.    Yes Historical Provider, MD  DULoxetine (CYMBALTA) 60 MG capsule Take 120 mg by mouth daily.   Yes Historical Provider, MD  Fluticasone-Salmeterol (ADVAIR DISKUS) 250-50 MCG/DOSE AEPB Inhale 1 puff into the lungs 2 (two) times daily.    Yes Historical Provider, MD  furosemide (LASIX) 40 MG tablet Take 40 mg by mouth daily.     Yes Historical Provider, MD  HYDROcodone-acetaminophen (NORCO) 7.5-325 MG per tablet Take 1 tablet by mouth every 6 (six) hours as needed for  moderate pain.   Yes Historical Provider, MD  ibuprofen (ADVIL,MOTRIN) 800 MG tablet Take 800 mg by mouth every 8 (eight) hours as needed.     Yes Historical Provider, MD  levalbuterol [facility] HFA) 45 MCG/ACT inhaler Inhale 2 puffs into the lungs every 4 (four) hours as needed.    Yes Historical Provider, MD  metFORMIN (GLUMETZA) 1000 MG (MOD) 24 hr tablet Take 1,000 mg by mouth 2 (two) times daily with a meal.   Yes Historical Provider, MD  metoprolol succinate (TOPROL-XL) 25 MG 24 hr tablet Take 25 mg by mouth every morning.    Yes Historical Provider, MD  olmesartan-hydrochlorothiazide (BENICAR HCT) 20-12.5 MG per tablet Take 1 tablet by mouth daily.     Yes Historical Provider, MD  omeprazole (PRILOSEC) 40 MG capsule Take 40 mg by mouth  2 (two) times daily.     Yes Historical Provider, MD  simvastatin (ZOCOR) 40 MG tablet Take 40 mg by mouth every morning.    Yes Historical Provider, MD  solifenacin (VESICARE) 10 MG tablet Take 10 mg by mouth daily.   Yes Historical Provider, MD  tiotropium (SPIRIVA) 18 MCG inhalation capsule Place 18 mcg into inhaler and inhale daily.    Yes Historical Provider, MD  trimethoprim (TRIMPEX) 100 MG tablet Take 100 mg by mouth at bedtime.   Yes Historical Provider, MD  warfarin (COUMADIN) 4 MG tablet Take 4 mg by mouth every morning. As directed   Yes Historical Provider, MD  glucose blood test strip 1 each by Other route as needed for other. Use as instructed    Historical Provider, MD    Inpatient Medications:  . metoprolol tartrate  25 mg Oral Once   . diltiazem (CARDIZEM) infusion 15 mg/hr (12/07/13 2230)  . diltiazem Stopped (12/07/13 2107)    Allergies:  Allergies  Allergen Reactions  . Latex Hives    History   Social History  . Marital Status: Legally Separated    Spouse Name: N/A    Number of Children: N/A  . Years of Education: N/A   Occupational History  . Not on file.   Social History Main Topics  . Smoking status: Smoker, Current Status Unknown -- 2.00 packs/day    Types: Cigarettes  . Smokeless tobacco: Never Used  . Alcohol Use: No  . Drug Use: No  . Sexual Activity: Not on file   Other Topics Concern  . Not on file   Social History Narrative  . No narrative on file     Family History  Problem Relation Age of Onset  . Heart disease Mother   . Asthma Father   . Lung cancer Father   . Asthma Sister   . Heart disease Sister   . Colon cancer Father      Review of Systems: General: negative for chills, fever, night sweats or weight changes.  Cardiovascular: per HPI  Dermatological: negative for rash Respiratory: per HPI  Urologic: negative for hematuria Abdominal: negative for nausea, vomiting, diarrhea, bright red blood per rectum, melena, or  hematemesis Neurologic: per HPI  All other systems reviewed and are otherwise negative except as noted above.  Labs:  Recent Labs  12/07/13 2049  TROPONINI <0.30   Lab Results  Component Value Date   WBC 9.4 12/07/2013   HGB 12.2 12/07/2013   HCT 36.0 12/07/2013   MCV 77.2* 12/07/2013   PLT 328 12/07/2013    Recent Labs Lab 12/07/13 2049 12/07/13 2108  NA 139 140  K  3.6 3.5  CL 102 99  CO2 26  --   BUN 18 18  CREATININE 1.23* 1.50*  CALCIUM 8.8  --   PROT 7.1  --   BILITOT 0.4  --   ALKPHOS 74  --   ALT 10  --   AST 17  --   GLUCOSE 116* 117*   No results found for this basename: CHOL, HDL, LDLCALC, TRIG   Lab Results  Component Value Date   DDIMER  Value: 0.43        AT THE INHOUSE ESTABLISHED CUTOFF VALUE OF 0.48 ug/mL FEU, THIS ASSAY HAS BEEN DOCUMENTED IN THE LITERATURE TO HAVE 12/24/2007    Radiology/Studies:  Ct Head Wo Contrast  12/07/2013   CLINICAL DATA:  Left arm numbness for 2 hours.  Tachycardia.  EXAM: CT HEAD WITHOUT CONTRAST  TECHNIQUE: Contiguous axial images were obtained from the base of the skull through the vertex without intravenous contrast.  COMPARISON:  MRI of the brain performed 07/25/2005  FINDINGS: There is no evidence of acute infarction, mass lesion, or intra- or extra-axial hemorrhage on CT.  Mild periventricular white matter change likely reflects small vessel ischemic microangiopathy. Prominence of the occipital horn of the left lateral ventricle likely reflects ex vacuo dilatation due to underlying encephalomalacia, reflecting remote infarct.  The posterior fossa, including the cerebellum, brainstem and fourth ventricle, is within normal limits. The third ventricle and basal ganglia are unremarkable in appearance. No mass effect or midline shift is seen.  There is no evidence of fracture; visualized osseous structures are unremarkable in appearance. The orbits are within normal limits. The paranasal sinuses and mastoid air cells are  well-aerated. No significant soft tissue abnormalities are seen.  IMPRESSION: 1. No acute intracranial pathology seen on CT. 2. Mild small vessel ischemic microangiopathy. 3. 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   Electronically Signed   By: Roanna Raider M.D.   On: 12/07/2013 22:01   Dg Chest Portable 1 View  12/07/2013   CLINICAL DATA:  Atrial fibrillation.  EXAM: PORTABLE CHEST - 1 VIEW  COMPARISON:  One-view chest 06/12/2012.  FINDINGS: The heart size is normal. The lungs are clear. The visualized soft tissues and bony thorax are unremarkable.  IMPRESSION: Negative one-view chest.   Electronically Signed   By: Gennette Pac M.D.   On: 12/07/2013 21:07    EKG/ Tele : Afib   Physical Exam: Blood pressure 120/92, pulse 134, temperature 98 F (36.7 C), resp. rate 25, SpO2 97.00%. General: Well developed, well nourished, in no acute distress. Head: Normocephalic, atraumatic, sclera non-icteric, no xanthomas, nares are without discharge.  Neck: Negative for carotid bruits. JVD not elevated. Lungs: Clear bilaterally to auscultation without wheezes, rales, or rhonchi. Breathing is unlabored. Heart:irregular   No murmurs, rubs, or gallops appreciated. Abdomen: Soft, non-tender, non-distended with normoactive bowel sounds. No hepatomegaly. No rebound/guarding. No obvious abdominal masses. Msk:  Strength and tone appear normal for age. Extremities: No clubbing or cyanosis. No edema.  Distal pedal pulses are 2+ and equal bilaterally. Neuro: Alert and oriented X 3. No facial asymmetry. No focal deficit. Moves all extremities spontaneously. Psych:  Responds to questions appropriately with a normal affect.     Assessment and Plan:  Atrial fibrillation  HTN  OSA    Plan  Switch Toprol Xl to metoprolol 25 mg BID for rapid uptitration as needed for rate control  Wean  Dilt gtt for target heart rate < 110 .  Cont anticoagulation  Strongly encouraged pt to be follouwp with her sleep doctor for OSA/ CPAP     Signed, YOUSUF, MIAN, A PA-C 12/08/2013, 12:23 AM  As above, patient seen and examined; presented with possible TIA; continue coumadin; was in a fib on admission now converted to sinus; would DC toprol; change cardizem to 180 mg po daily. Await TSH; repeat echo; if LV function normal; patient can be DCed from a cardiac standpoint and fu in office; microcytic anemia, TIA (MRI planned) and renal insuff per primary care. Olga Millers

## 2013-12-08 NOTE — Progress Notes (Signed)
VASCULAR LAB PRELIMINARY  PRELIMINARY  PRELIMINARY  PRELIMINARY  Carotid duplex  completed.    Preliminary report:  Bilateral:  1-39% ICA stenosis.  Vertebral artery flow is antegrade.      Colleen [name], RVT 12/08/2013, 1:10 PM

## 2013-12-08 NOTE — Progress Notes (Signed)
ANTICOAGULATION CONSULT NOTE - Initial Consult  Pharmacy Consult for Coumadin Indication: atrial fibrillation  Allergies  Allergen Reactions  . Latex Hives    Vital Signs: Temp: 98 F (36.7 C) (12/09 2211) BP: 110/58 mmHg (12/10 0200) Pulse Rate: 124 (12/10 0200)  Labs:  Recent Labs  12/07/13 2049 12/07/13 2108  HGB 10.2* 12.2  HCT 34.2* 36.0  PLT 328  --   APTT 45*  --   LABPROT 26.7*  --   INR 2.57*  --   CREATININE 1.23* 1.50*  TROPONINI <0.30  --      Medical History: Past Medical History  Diagnosis Date  . Atrial fibrillation   . Hypertension     benign  . Hiatal hernia   . Adenomatous polyp of colon   . Acute asthmatic bronchitis   . GERD (gastroesophageal reflux disease)     EGD neg 03/07/2008, but prev required dilation  . COPD (chronic obstructive pulmonary disease)   . IBS (irritable bowel syndrome)     chronic  . Urinary incontinence   . Esophagitis   . Chronic gastritis   . Duodenitis without hemorrhage 10/01/2001  . Diverticulosis   . Personal history of colonic adenomas 02/05/2008        . Stroke     x 2  . Arthritis   . Anemia   . Diabetes   . Hyperlipidemia     Assessment: 62yo female comes from home in Afib w/ RVR w/ left arm numbness x2hr, admitted for further management/med titration, to continue Coumadin; admitted with therapeutic INR.  Goal of Therapy:  INR 2-3   Plan:  Will continue home Coumadin dose of 4mg  daily and monitor INR.  Vernard Gambles, PharmD, BCPS  12/08/2013,2:43 AM

## 2013-12-08 NOTE — Care Management Note (Signed)
    Page 1 of 1   12/08/2013     8:57:26 AM   CARE MANAGEMENT NOTE 12/08/2013  Patient:  Colleen [name], Colleen [name]   Account Number:  [id number]  Date Initiated:  12/08/2013  Documentation initiated by:  Junius Creamer  Subjective/Objective Assessment:   adm w at fib w rvr     Action/Plan:   lives w fam, pcp dr Mauricio Po   Anticipated DC Date:     Anticipated DC Plan:           Choice offered to / List presented to:             Status of service:   Medicare Important Message given?   (If response is "NO", the following Medicare IM given date fields will be blank) Date Medicare IM given:   Date Additional Medicare IM given:    Discharge Disposition:    Per UR Regulation:  Reviewed for med. necessity/level of care/duration of stay  If discussed at Long Length of Stay Meetings, dates discussed:    Comments:

## 2013-12-08 NOTE — Progress Notes (Signed)
[facility] Progress Note [place] TEAM 1 - Stepdown ICU Team    Colleen [name] BJY:[id number] DOB: 1951/03/17 DOA: 12/07/2013 PCP: Dema Severin, NP  Brief narrative: 62 y.o. female with history of atrial fibrillation, COPD, hypertension, ongoing tobacco abuse started experiencing left upper and lower extremity tingling and numbness around 5:30 PM on date of admission which lasted for about half an hour and resolved. 30 minutes later patient started experiencing tingling and numbness of the left side of the face which also resolved within 30 minutes. Patient was concerned so she came to the ER.   On exam patient was completely nonfocal and her symptoms had resolved. CTA did not show anything acute. In the ER patient was found to be in atrial fibrillation with RVR. Patient was started on Cardizem infusion. Cardiology was consulted. Patient's INR was therapeutic. Patient otherwise denied any shortness of breath or chest pain. Patient stated that a few years ago she did have a stroke which left her with mild weakness in her memory and decreased peripheral vision.  Assessment/Plan:    Chronic Atrial fibrillation with acute RVR -appreciate Cards assistance -RVR has resolved and has converted back to sinus rhythm -Cardizem gtt has been weaned off -Toprol XL changed to BID Metoprolol for ease of rapid titration -cont anticoagulation    TIA (transient ischemic attack) -Neurology following -MRI and carotid duplex have been negative -sx's could have been precipitated by RVR    HYPERTENSION, BENIGN -controlled    Grade 2 diastolic dysfunction -New finding since last echo 2011 -Compensated    Diabetes mellitus -controlled -cont SSI -on Metformin pre admit    HYPERLIPIDEMIA    TOBACCO ABUSE   DVT prophylaxis: Coumadin Code Status: Full Family Communication: Patient Disposition Plan/Expected LOS: Transfer to telemetry  Consultants: Cardiology Neurology  Procedures: 2-D  echocardiogram Left ventricle: The cavity size was normal. Wall thickness was increased in a pattern of mild LVH. Systolic function was normal. The estimated ejection fraction was in the range of 60% to 65%. Wall motion was normal; there were no regional wall motion abnormalities. Features are consistent with a pseudonormal left ventricular filling pattern, with concomitant abnormal relaxation and increased filling pressure (grade 2 diastolic dysfunction).  Carotid duplex Preliminary results are normal  Antibiotics: None  HPI/Subjective: Seen for f/u visit   Objective: Blood pressure 118/85, pulse 73, temperature 98.5 F (36.9 C), temperature source Oral, resp. rate 25, height 5\' 4"  (1.626 m), weight 184 lb 15.5 oz (83.9 kg), SpO2 93.00%.  Intake/Output Summary (Last 24 hours) at 12/08/13 1424 Last data filed at 12/08/13 0800  Gross per 24 hour  Intake 132.75 ml  Output    550 ml  Net -417.25 ml   Exam: F/u exam completed  Scheduled Meds:  Scheduled Meds: . ALPRAZolam  0.5 mg Oral QHS  . atorvastatin  20 mg Oral q1800  . darifenacin  7.5 mg Oral Daily  . diltiazem  180 mg Oral Daily  . DULoxetine  120 mg Oral Daily  . insulin aspart  0-9 Units Subcutaneous TID WC  . irbesartan  150 mg Oral Daily  . loratadine  10 mg Oral Daily  . mometasone-formoterol  2 puff Inhalation BID  . pantoprazole  40 mg Oral Daily  . sodium chloride  3 mL Intravenous Q12H  . sodium chloride  3 mL Intravenous Q12H  . tiotropium  18 mcg Inhalation Daily  . trimethoprim  100 mg Oral QHS  . warfarin  4 mg Oral q1800  .  Warfarin - Pharmacist Dosing Inpatient   Does not apply q1800   Data Reviewed: Basic Metabolic Panel:  Recent Labs Lab 12/07/13 2049 12/07/13 2108  NA 139 140  K 3.6 3.5  CL 102 99  CO2 26  --   GLUCOSE 116* 117*  BUN 18 18  CREATININE 1.23* 1.50*  CALCIUM 8.8  --    Liver Function Tests:  Recent Labs Lab 12/07/13 2049  AST 17  ALT 10  ALKPHOS 74    BILITOT 0.4  PROT 7.1  ALBUMIN 3.3*   CBC:  Recent Labs Lab 12/07/13 2049 12/07/13 2108  WBC 9.4  --   NEUTROABS 4.9  --   HGB 10.2* 12.2  HCT 34.2* 36.0  MCV 77.2*  --   PLT 328  --    Cardiac Enzymes:  Recent Labs Lab 12/07/13 2049 12/08/13 0307 12/08/13 0850  TROPONINI <0.30 <0.30 <0.30   CBG:  Recent Labs Lab 12/07/13 2108 12/08/13 0732 12/08/13 1129  GLUCAP 116* 107* 133*    Recent Results (from the past 240 hour(s))  MRSA PCR SCREENING     Status: None   Collection Time    12/08/13  4:07 AM      Result Value Range Status   MRSA by PCR NEGATIVE  NEGATIVE Final   Comment:            The GeneXpert MRSA Assay (FDA     approved for NASAL specimens     only), is one component of a     comprehensive MRSA colonization     surveillance program. It is not     intended to diagnose MRSA     infection nor to guide or     monitor treatment for     MRSA infections.     Studies:  Recent x-ray studies have been reviewed in detail by the Attending Physician     Junious Silk, ANP [facility] Office  [phone] Pager [phone]  **If unable to reach the above provider after paging please contact the Flow Manager @ [phone]  On-Call/Text Page:      [email or web address]      password [email or web address]  If 7PM-7AM, please contact night-coverage [email or web address] Password [email or web address] 12/08/2013, 2:24 PM   LOS: 1 day   I have personally examined this patient and reviewed the entire database. I have reviewed the above note, made any necessary editorial changes, and agree with its content.  Lonia Blood, MD [facility]

## 2013-12-09 DIAGNOSIS — G459 Transient cerebral ischemic attack, unspecified: Secondary | ICD-10-CM

## 2013-12-09 DIAGNOSIS — I4891 Unspecified atrial fibrillation: Principal | ICD-10-CM

## 2013-12-09 DIAGNOSIS — I10 Essential (primary) hypertension: Secondary | ICD-10-CM

## 2013-12-09 DIAGNOSIS — E785 Hyperlipidemia, unspecified: Secondary | ICD-10-CM

## 2013-12-09 LAB — GLUCOSE, CAPILLARY: Glucose-Capillary: 148 mg/dL — ABNORMAL HIGH (ref 70–99)

## 2013-12-09 LAB — PROTIME-INR
INR: 2.42 — ABNORMAL HIGH (ref 0.00–1.49)
Prothrombin Time: 25.5 seconds — ABNORMAL HIGH (ref 11.6–15.2)

## 2013-12-09 MED ORDER — METOPROLOL SUCCINATE ER 25 MG PO TB24
25.0000 mg | ORAL_TABLET | Freq: Every day | ORAL | Status: DC
  Administered 2013-12-09: 25 mg via ORAL
  Filled 2013-12-09: qty 1

## 2013-12-09 MED ORDER — DILTIAZEM HCL ER COATED BEADS 180 MG PO CP24: 180.0000 mg | ORAL_CAPSULE | Freq: Every day | ORAL | Status: DC

## 2013-12-09 NOTE — Discharge Summary (Signed)
Physician Discharge Summary  Colleen [name] ZOX:[id number] DOB: 12/27/1951 DOA: 12/07/2013  PCP: Dema Severin, NP  Admit date: 12/07/2013 Discharge date: 12/09/2013  Time spent: 30 minutes  Recommendations for Outpatient Follow-up:  1. Follow up with the cardiologist as previously scheduled 2. Follow up with your PCP 2 weeks after dc for routine hospital follow up  Discharge Diagnoses:  Active Problems:   Atrial fibrillation with RVR-resolved   ? TIA (transient ischemic attack)   Paresthesia due to atrial fibrillation with RVR   HYPERTENSION, BENIGN   Diabetes mellitus   HYPERLIPIDEMIA   TOBACCO ABUSE   Discharge Condition: stable  Diet recommendation: Carbohydrate modified  Filed Weights   12/08/13 0409  Weight: 184 lb 15.5 oz (83.9 kg)    History of present illness:  62 y.o. female with history of atrial fibrillation, COPD, hypertension, ongoing tobacco abuse started experiencing left upper and lower extremity tingling and numbness around 5:30 PM on date of admission which lasted for about half an hour and resolved. 30 minutes later patient started experiencing tingling and numbness of the left side of the face which also resolved within 30 minutes. Patient was concerned so she came to the ER.   On exam patient was completely nonfocal and her symptoms had resolved. CTA did not show anything acute. In the ER patient was found to be in atrial fibrillation with RVR. Patient was started on Cardizem infusion. Cardiology was consulted. Patient's INR was therapeutic. Patient otherwise denied any shortness of breath or chest pain. Patient stated that a few years ago she did have a stroke which left her with mild weakness in her memory and decreased peripheral vision.   Hospital Course:  Chronic Atrial fibrillation with acute RVR  -Cards evaluated -RVR resolved and she had converted back to sinus rhythm after Cardizem was added this admission -cont anticoagulation -INR was  therapeutic at time of admission  TIA (transient ischemic attack)  -Neurology evalauted -MRI and carotid duplex have been negative  -sx's likely precipitated by RVR   HYPERTENSION, BENIGN  -controlled   Grade 2 diastolic dysfunction  -New finding since last echo 2011  -Compensated   Diabetes mellitus  -controlled  -cont SSI  -resume Metformin   Procedures: 2-D echocardiogram  Left ventricle: The cavity size was normal. Wall thickness was increased in a pattern of mild LVH. Systolic function was normal. The estimated ejection fraction was in the range of 60% to 65%. Wall motion was normal; there were no regional wall motion abnormalities. Features are consistent with a pseudonormal left ventricular filling pattern, with concomitant abnormal relaxation and increased filling pressure (grade 2 diastolic dysfunction).  Carotid duplex  normal   Consultations:  Cardiology  Neurology  Discharge Exam: Filed Vitals:   12/09/13 1002  BP: 127/79  Pulse: 104  Temp:   Resp:    General: No acute respiratory distress Lungs: Clear to auscultation bilaterally without wheezes or crackles, RA Cardiovascular: Regular rate and rhythm without murmur gallop or rub normal S1 and S2, no peripheral edema or JVD Abdomen: Nontender, nondistended, soft, bowel sounds positive, no rebound, no ascites, no appreciable mass Musculoskeletal: No significant cyanosis, clubbing of bilateral lower extremities Neurological: Alert and oriented x 3, moves all extremities x 4 without focal neurological deficits, CN 2-12 intact   Discharge Instructions      Discharge Orders   Future Appointments Provider Department Dept Phone   01/04/2014 4:15 PM Lewayne Bunting, MD [facility] [place] [facility] [phone]   Future Orders Complete  By Expires   Call MD for:  difficulty breathing, headache or visual disturbances  As directed    Call MD for:  extreme fatigue  As directed    Call MD for:   persistant dizziness or light-headedness  As directed    Diet Carb Modified  As directed    Increase activity slowly  As directed        Medication List         ADVAIR DISKUS 250-50 MCG/DOSE Aepb  Generic drug:  Fluticasone-Salmeterol  Inhale 1 puff into the lungs 2 (two) times daily.     albuterol (2.5 MG/3ML) 0.083% nebulizer solution  Commonly known as:  PROVENTIL  Take 2.5 mg by nebulization 2 (two) times daily.     ALPRAZolam 0.5 MG tablet  Commonly known as:  XANAX  Take 0.5 mg by mouth at bedtime.     cetirizine 10 MG tablet  Commonly known as:  ZYRTEC  Take 10 mg by mouth at bedtime.     diltiazem 180 MG 24 hr capsule  Commonly known as:  CARDIZEM CD  Take 1 capsule (180 mg total) by mouth daily.     DULoxetine 60 MG capsule  Commonly known as:  CYMBALTA  Take 120 mg by mouth daily.     furosemide 40 MG tablet  Commonly known as:  LASIX  Take 40 mg by mouth daily.     glucose blood test strip  1 each by Other route as needed for other. Use as instructed     HYDROcodone-acetaminophen 7.5-325 MG per tablet  Commonly known as:  NORCO  Take 1 tablet by mouth every 6 (six) hours as needed for moderate pain.     ibuprofen 800 MG tablet  Commonly known as:  ADVIL,MOTRIN  Take 800 mg by mouth every 8 (eight) hours as needed.     levalbuterol 45 MCG/ACT inhaler  Commonly known as:  XOPENEX HFA  Inhale 2 puffs into the lungs every 4 (four) hours as needed.     metFORMIN 1000 MG (MOD) 24 hr tablet  Commonly known as:  GLUMETZA  Take 1,000 mg by mouth 2 (two) times daily with a meal.     metoprolol succinate 25 MG 24 hr tablet  Commonly known as:  TOPROL-XL  Take 25 mg by mouth every morning.     olmesartan-hydrochlorothiazide 20-12.5 MG per tablet  Commonly known as:  BENICAR HCT  Take 1 tablet by mouth daily.     omeprazole 40 MG capsule  Commonly known as:  PRILOSEC  Take 40 mg by mouth 2 (two) times daily.     simvastatin 40 MG tablet  Commonly  known as:  ZOCOR  Take 40 mg by mouth every morning.     solifenacin 10 MG tablet  Commonly known as:  VESICARE  Take 10 mg by mouth daily.     tiotropium 18 MCG inhalation capsule  Commonly known as:  SPIRIVA  Place 18 mcg into inhaler and inhale daily.     trimethoprim 100 MG tablet  Commonly known as:  TRIMPEX  Take 100 mg by mouth at bedtime.     warfarin 4 MG tablet  Commonly known as:  COUMADIN  Take 4 mg by mouth every morning. As directed       Allergies  Allergen Reactions  . Latex Hives   Follow-up Information   Follow up with Dema Severin, NP. Schedule an appointment as soon as possible for a visit in 2 weeks. (For  routine hospital follow up)    Contact information:   [address] [place] [address] [phone]        The results of significant diagnostics from this hospitalization (including imaging, microbiology, ancillary and laboratory) are listed below for reference.    Significant Diagnostic Studies: Ct Head Wo Contrast  12/07/2013   CLINICAL DATA:  Left arm numbness for 2 hours.  Tachycardia.  EXAM: CT HEAD WITHOUT CONTRAST  TECHNIQUE: Contiguous axial images were obtained from the base of the skull through the vertex without intravenous contrast.  COMPARISON:  MRI of the brain performed 07/25/2005  FINDINGS: There is no evidence of acute infarction, mass lesion, or intra- or extra-axial hemorrhage on CT.  Mild periventricular white matter change likely reflects small vessel ischemic microangiopathy. Prominence of the occipital horn of the left lateral ventricle likely reflects ex vacuo dilatation due to underlying encephalomalacia, reflecting remote infarct.  The posterior fossa, including the cerebellum, brainstem and fourth ventricle, is within normal limits. The third ventricle and basal ganglia are unremarkable in appearance. No mass effect or midline shift is seen.  There is no evidence of fracture; visualized osseous structures are unremarkable  in appearance. The orbits are within normal limits. The paranasal sinuses and mastoid air cells are well-aerated. No significant soft tissue abnormalities are seen.  IMPRESSION: 1. No acute intracranial pathology seen on CT. 2. Mild small vessel ischemic microangiopathy. 3. 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   Electronically Signed   By: Roanna Raider M.D.   On: 12/07/2013 22:01   Mr Brain Wo Contrast  12/08/2013   CLINICAL DATA:  Left-sided numbness since yesterday.  EXAM: MRI HEAD WITHOUT CONTRAST  TECHNIQUE: Multiplanar, multiecho pulse sequences of the brain and surrounding structures were obtained without intravenous contrast.  COMPARISON:  Head CT 12/07/2013. Brain MRI 07/25/2005 from [facility].  FINDINGS: Encephalomalacia is noted in the medial left occipital lobe related to remote infarct. There are multiple small foci of T2 hyperintensity within the subcortical and deep cerebral white matter bilaterally and in the pons, compatible with mild chronic small vessel ischemic disease. There is no evidence of acute infarct, mass, midline shift, intracranial hemorrhage, or extra-axial fluid collection. Orbits are unremarkable. Mild mucosal thickening is noted in the left frontal sinus and anterior left ethmoid air cells. Small mucous retention cyst is noted in the right maxillary sinus. Major intracranial vascular flow voids are unremarkable.  IMPRESSION: No evidence of acute intracranial abnormality. Remote left occipital infarct and mild chronic small vessel ischemic disease.   Electronically Signed   By: Sebastian Ache   On: 12/08/2013 11:22   Dg Chest Portable 1 View  12/07/2013   CLINICAL DATA:  Atrial fibrillation.  EXAM: PORTABLE CHEST - 1 VIEW  COMPARISON:  One-view chest 06/12/2012.  FINDINGS: The heart size is normal. The lungs are clear. The visualized soft  tissues and bony thorax are unremarkable.  IMPRESSION: Negative one-view chest.   Electronically Signed   By: Gennette Pac M.D.   On: 12/07/2013 21:07    Microbiology: Recent Results (from the past 240 hour(s))  MRSA PCR SCREENING     Status: None   Collection Time    12/08/13  4:07 AM      Result Value Range Status   MRSA by PCR NEGATIVE  NEGATIVE Final   Comment:  The GeneXpert MRSA Assay (FDA     approved for NASAL specimens     only), is one component of a     comprehensive MRSA colonization     surveillance program. It is not     intended to diagnose MRSA     infection nor to guide or     monitor treatment for     MRSA infections.     Labs: Basic Metabolic Panel:  Recent Labs Lab 12/07/13 2049 12/07/13 2108  NA 139 140  K 3.6 3.5  CL 102 99  CO2 26  --   GLUCOSE 116* 117*  BUN 18 18  CREATININE 1.23* 1.50*  CALCIUM 8.8  --    Liver Function Tests:  Recent Labs Lab 12/07/13 2049  AST 17  ALT 10  ALKPHOS 74  BILITOT 0.4  PROT 7.1  ALBUMIN 3.3*   No results found for this basename: LIPASE, AMYLASE,  in the last 168 hours No results found for this basename: AMMONIA,  in the last 168 hours CBC:  Recent Labs Lab 12/07/13 2049 12/07/13 2108  WBC 9.4  --   NEUTROABS 4.9  --   HGB 10.2* 12.2  HCT 34.2* 36.0  MCV 77.2*  --   PLT 328  --    Cardiac Enzymes:  Recent Labs Lab 12/07/13 2049 12/08/13 0307 12/08/13 0850 12/08/13 1635  TROPONINI <0.30 <0.30 <0.30 <0.30   BNP: BNP (last 3 results) No results found for this basename: PROBNP,  in the last 8760 hours CBG:  Recent Labs Lab 12/08/13 0732 12/08/13 1129 12/08/13 1644 12/08/13 1940 12/09/13 0815  GLUCAP 107* 133* 180* 112* 148*       Signed:  ELLIS,ALLISON L. ANP [facility] 12/09/2013, 11:19 AM   I have examined the patient, reviewed the chart and modified the above note which I agree with.   Derrick Tiegs,MD [phone] 12/09/2013, 3:55 PM

## 2013-12-09 NOTE — Progress Notes (Signed)
    Subjective:  Denies CP or dyspnea   Objective:  Filed Vitals:   12/08/13 1707 12/08/13 1938 12/08/13 1942 12/09/13 0444  BP: 103/85  134/75 138/79  Pulse: 70  88 93  Temp: 97.9 F (36.6 C)  98.8 F (37.1 C) 98.8 F (37.1 C)  TempSrc: Oral  Oral Oral  Resp: 18  18 18   Height:      Weight:      SpO2: 92% 98% 98% 91%    Intake/Output from previous day:  Intake/Output Summary (Last 24 hours) at 12/09/13 0657 Last data filed at 12/08/13 1700  Gross per 24 hour  Intake    260 ml  Output      0 ml  Net    260 ml    Physical Exam: Physical exam: Well-developed well-nourished in no acute distress.  Skin is warm and dry.  HEENT is normal.  Neck is supple.  Chest with diminished BS Cardiovascular exam is regular rate and rhythm.  Abdominal exam nontender or distended. No masses palpated. Extremities show no edema. neuro grossly intact    Lab Results: Basic Metabolic Panel:  Recent Labs  16/10/96 2049 12/07/13 2108  NA 139 140  K 3.6 3.5  CL 102 99  CO2 26  --   GLUCOSE 116* 117*  BUN 18 18  CREATININE 1.23* 1.50*  CALCIUM 8.8  --    CBC:  Recent Labs  12/07/13 2049 12/07/13 2108  WBC 9.4  --   NEUTROABS 4.9  --   HGB 10.2* 12.2  HCT 34.2* 36.0  MCV 77.2*  --   PLT 328  --    Cardiac Enzymes:  Recent Labs  12/08/13 0307 12/08/13 0850 12/08/13 1635  TROPONINI <0.30 <0.30 <0.30     Assessment/Plan:  1 paroxysmal atrial fibrillation-the patient remains in sinus rhythm. Continue Coumadin. INR is therapeutic. Echo shows normal LV function. TSH normal. Continue Cardizem at present dose. Add Toprol 25 mg daily. 2 TIA-continue Coumadin. 3 microcytic anemia-evaluation per primary care. 4 hypertension-Continue present medications. 5 tobacco abuse-patient counseled on discontinuing. 6 COPD Patient can be discharged from a cardiac standpoint.   Colleen [name] 12/09/2013, 6:57 AM

## 2013-12-10 NOTE — Plan of Care (Cosign Needed)
Insurance does not pay for Diltiazem 180 24 hr cap. Discussed with Lawson Fiscal with [facility] - will change to 60 mg PO TID.  Junious Silk, ANP

## 2014-01-04 ENCOUNTER — Ambulatory Visit (INDEPENDENT_AMBULATORY_CARE_PROVIDER_SITE_OTHER): Payer: Medicaid Other | Admitting: Cardiology

## 2014-01-04 ENCOUNTER — Observation Stay (HOSPITAL_COMMUNITY)
Admission: AD | Admit: 2014-01-04 | Discharge: 2014-01-06 | Disposition: A | Discharge status: Home / Self Care | Payer: Medicaid Other | Source: Ambulatory Visit | Attending: Cardiology | Admitting: Cardiology

## 2014-01-04 ENCOUNTER — Encounter: Payer: Self-pay | Admitting: Cardiology

## 2014-01-04 VITALS — BP 92/68 | HR 133 | Ht 64.5 in | Wt 179.0 lb

## 2014-01-04 DIAGNOSIS — Z7901 Long term (current) use of anticoagulants: Secondary | ICD-10-CM

## 2014-01-04 DIAGNOSIS — Z8673 Personal history of transient ischemic attack (TIA), and cerebral infarction without residual deficits: Secondary | ICD-10-CM

## 2014-01-04 DIAGNOSIS — I4892 Unspecified atrial flutter: Secondary | ICD-10-CM

## 2014-01-04 DIAGNOSIS — D509 Iron deficiency anemia, unspecified: Secondary | ICD-10-CM | POA: Insufficient documentation

## 2014-01-04 DIAGNOSIS — Z79899 Other long term (current) drug therapy: Secondary | ICD-10-CM | POA: Insufficient documentation

## 2014-01-04 DIAGNOSIS — K449 Diaphragmatic hernia without obstruction or gangrene: Secondary | ICD-10-CM | POA: Insufficient documentation

## 2014-01-04 DIAGNOSIS — Z0389 Encounter for observation for other suspected diseases and conditions ruled out: Secondary | ICD-10-CM

## 2014-01-04 DIAGNOSIS — J449 Chronic obstructive pulmonary disease, unspecified: Secondary | ICD-10-CM | POA: Diagnosis present

## 2014-01-04 DIAGNOSIS — Z9981 Dependence on supplemental oxygen: Secondary | ICD-10-CM | POA: Insufficient documentation

## 2014-01-04 DIAGNOSIS — E119 Type 2 diabetes mellitus without complications: Secondary | ICD-10-CM

## 2014-01-04 DIAGNOSIS — F17200 Nicotine dependence, unspecified, uncomplicated: Secondary | ICD-10-CM

## 2014-01-04 DIAGNOSIS — I10 Essential (primary) hypertension: Secondary | ICD-10-CM | POA: Diagnosis present

## 2014-01-04 DIAGNOSIS — I4891 Unspecified atrial fibrillation: Secondary | ICD-10-CM | POA: Diagnosis present

## 2014-01-04 DIAGNOSIS — F329 Major depressive disorder, single episode, unspecified: Secondary | ICD-10-CM | POA: Insufficient documentation

## 2014-01-04 DIAGNOSIS — IMO0001 Reserved for inherently not codable concepts without codable children: Secondary | ICD-10-CM

## 2014-01-04 DIAGNOSIS — K589 Irritable bowel syndrome without diarrhea: Secondary | ICD-10-CM | POA: Insufficient documentation

## 2014-01-04 DIAGNOSIS — F3289 Other specified depressive episodes: Secondary | ICD-10-CM | POA: Insufficient documentation

## 2014-01-04 DIAGNOSIS — J4489 Other specified chronic obstructive pulmonary disease: Secondary | ICD-10-CM | POA: Diagnosis present

## 2014-01-04 DIAGNOSIS — E785 Hyperlipidemia, unspecified: Secondary | ICD-10-CM | POA: Diagnosis present

## 2014-01-04 DIAGNOSIS — I5189 Other ill-defined heart diseases: Secondary | ICD-10-CM | POA: Diagnosis present

## 2014-01-04 DIAGNOSIS — M129 Arthropathy, unspecified: Secondary | ICD-10-CM | POA: Insufficient documentation

## 2014-01-04 DIAGNOSIS — K219 Gastro-esophageal reflux disease without esophagitis: Secondary | ICD-10-CM | POA: Insufficient documentation

## 2014-01-04 DIAGNOSIS — G459 Transient cerebral ischemic attack, unspecified: Secondary | ICD-10-CM | POA: Diagnosis present

## 2014-01-04 HISTORY — DX: Depression, unspecified: F32.A

## 2014-01-04 HISTORY — DX: Dependence on supplemental oxygen: Z99.81

## 2014-01-04 HISTORY — DX: Pneumonia, unspecified organism: J18.9

## 2014-01-04 HISTORY — DX: Thyrotoxicosis, unspecified without thyrotoxic crisis or storm: E05.90

## 2014-01-04 HISTORY — DX: Paroxysmal atrial fibrillation: I48.0

## 2014-01-04 HISTORY — DX: Major depressive disorder, single episode, unspecified: F32.9

## 2014-01-04 HISTORY — DX: Unspecified atrial flutter: I48.92

## 2014-01-04 HISTORY — DX: Type 2 diabetes mellitus without complications: E11.9

## 2014-01-04 NOTE — Progress Notes (Signed)
Pt arrived to 3W at 1900, paged admitting physician to get admission orders. Lowell Guitar

## 2014-01-04 NOTE — Assessment & Plan Note (Signed)
Continue bronchodilators. 

## 2014-01-04 NOTE — Assessment & Plan Note (Signed)
Continue present medications and follow CBGs.

## 2014-01-04 NOTE — Assessment & Plan Note (Addendum)
Patient is in atrial flutter today with a rapid ventricular response. Her blood pressure is soft with a systolic of 95. I will admit for rate control. Continue metoprolol. Change Cardizem to 60 mg by mouth every 6 hours. Increase AV nodal blocking agents as needed. Continue Coumadin. Note she was in atrial fibrillation on December 9 and I therefore think that atrial flutter ablation would not be indicated. Note echocardiogram recently showed normal LV function.she will need lifelong Coumadin given recent TIA.

## 2014-01-04 NOTE — H&P (Signed)
Colleen [name]  01/04/2014 4:15 PM   Office Visit  MRN:  [id number]   Description: 63 year old female  Provider: Lelon Perla, MD  Department: [address] [facility]        Referring Provider    Sharolyn Douglas, MD      Diagnoses    Atrial fibrillation with RVR    -  Primary    427.31    Atrial flutter        427.32    Essential hypertension, benign        401.1    Tobacco use disorder        305.1    Atrial fibrillation        427.31    Diabetes mellitus        250.00    Other and unspecified hyperlipidemia        272.4    Chronic airway obstruction, not elsewhere classified        496      Reason for Visit    Follow-up    Atrial fibrillation         Progress Notes    Lelon Perla, MD at 01/04/2014  2:14 PM    Status: Signed                     HPI: followup atrial fibrillation. Cardiac catheterization in March of 2007 showed normal LV function and normal coronary arteries. Nuclear study in October of 2011 showed an ejection fraction of 73% and normal perfusion. Carotid Dopplers in December of 2014 showed 1-39% bilateral stenosis. Echocardiogram in December of 2014 showed normal LV function, mild left ventricular hypertrophy and grade 2 diastolic dysfunction. TSH in December 2014 1.556. Patient recently presented to the emergency room with question TIA. She was noted to be in atrial fibrillation but converted spontaneously. She has been treated with Cardizem/toprol and Coumadin. She was noted to have a microcytic anemia which was to be evaluated by primary care. Since DC, she has mild dyspnea on exertion but no orthopnea, PND, pedal edema, palpitations, syncope, chest pain or bleeding.    Current Outpatient Prescriptions   Medication  Sig  Dispense  Refill   .  albuterol (PROVENTIL) (2.5 MG/3ML) 0.083% nebulizer solution  Take 2.5 mg by nebulization 2 (two) times daily.          [name]  ALPRAZolam (XANAX) 0.5 MG tablet  Take 0.5 mg by mouth at bedtime.           .  cetirizine (ZYRTEC) 10 MG tablet  Take 10 mg by mouth at bedtime.          [name]  diltiazem (CARDIZEM) 60 MG tablet  TAKE 60MG  ( TOTAL OF  180MG  ) TABLET DAILY         .  DULoxetine (CYMBALTA) 60 MG capsule  Take 120 mg by mouth daily.         .  Fluticasone-Salmeterol (ADVAIR DISKUS) 250-50 MCG/DOSE AEPB  Inhale 1 puff into the lungs 2 (two) times daily.          .  furosemide (LASIX) 40 MG tablet  Take 40 mg by mouth daily.           [name]  glucose blood test strip  1 each by Other route as needed for other. Use as instructed         .  HYDROcodone-acetaminophen (NORCO) 7.5-325 MG per tablet  Take 1 tablet  by mouth every 6 (six) hours as needed for moderate pain.         [name]  ibuprofen (ADVIL,MOTRIN) 800 MG tablet  Take 800 mg by mouth every 8 (eight) hours as needed.           .  levalbuterol (XOPENEX HFA) 45 MCG/ACT inhaler  Inhale 2 puffs into the lungs every 4 (four) hours as needed.          .  metFORMIN (GLUMETZA) 1000 MG (MOD) 24 hr tablet  Take 1,000 mg by mouth 2 (two) times daily with a meal.         .  metoprolol succinate (TOPROL-XL) 25 MG 24 hr tablet  Take 25 mg by mouth every morning.          .  olmesartan-hydrochlorothiazide (BENICAR HCT) 20-12.5 MG per tablet  Take 1 tablet by mouth daily.           [name]  omeprazole (PRILOSEC) 40 MG capsule  Take 40 mg by mouth 2 (two) times daily.           .  simvastatin (ZOCOR) 40 MG tablet  Take 40 mg by mouth every morning.          .  solifenacin (VESICARE) 10 MG tablet  Take 10 mg by mouth daily.         [name]  tiotropium (SPIRIVA) 18 MCG inhalation capsule  Place 18 mcg into inhaler and inhale daily.          [name]  trimethoprim (TRIMPEX) 100 MG tablet  Take 100 mg by mouth at bedtime.         [name]  warfarin (COUMADIN) 3 MG tablet  TAKE AS DIRECTED         .  warfarin (COUMADIN) 4 MG tablet  Take 4 mg by mouth every morning. As directed             No current facility-administered medications for this visit.           Past Medical History    Diagnosis  Date   .  Atrial fibrillation     .  Hypertension         benign   .  Hiatal hernia     .  Adenomatous polyp of colon     .  Acute asthmatic bronchitis     .  GERD (gastroesophageal reflux disease)         EGD neg 03/07/2008, but prev required dilation   .  COPD (chronic obstructive pulmonary disease)     .  IBS (irritable bowel syndrome)         chronic   .  Urinary incontinence     .  Esophagitis     .  Chronic gastritis     .  Duodenitis without hemorrhage  10/01/2001   .  Diverticulosis     .  Personal history of colonic adenomas  02/05/2008            .  Stroke         x 2   .  Arthritis     .  Anemia     .  Diabetes     .  Hyperlipidemia     .  Decreased peripheral vision of right eye           Past Surgical History   Procedure  Laterality  Date   .  Cholecystectomy       .  Hysterectomy/bso/bladder repair       .  Breast surgery       .  Colonoscopy       .  Upper gi endoscopy             History       Social History   .  Marital Status:  Legally Separated       Spouse Name:  N/A       Number of Children:  N/A   .  Years of Education:  N/A       Occupational History   .  Not on file.       Social History Main Topics   .  Smoking status:  Smoker, Current Status Unknown -- 2.00 packs/day for 50 years       Types:  Cigarettes   .  Smokeless tobacco:  Never Used   .  Alcohol Use:  No   .  Drug Use:  No   .  Sexual Activity:  Not on file       Other Topics  Concern   .  Not on file       Social History Narrative   .  No narrative on file        ROS: chronic productive cough but no fevers or chills, hemoptysis, dysphasia, odynophagia, melena, hematochezia, dysuria, hematuria, rash, seizure activity, orthopnea, PND, pedal edema, claudication. Remaining systems are negative.   Physical Exam: Well-developed well-nourished in no acute distress.   Back is normal Not depressed. Skin is warm and dry.   HEENT is normal.   Neck is  supple. Normal carotid upstroke. No bruits. Chest is clear to auscultation with normal expansion.   Cardiovascular exam is tachycardic and irregular, no murmurs rubs or. Abdominal exam nontender or distended. No masses palpated. No bruit noted. Note vascular medically. 2+ femoral pulses. No bruits. Extremities show no edema. 2+ dorsalis pedis pulses. neuro grossly intact   ECG atrial flutter at a rate of 133. Normal axis.                  Atrial flutter - Lelon Perla, MD at 01/04/2014  4:48 PM    Status: Alison Stalling Related Problem: Atrial flutter    Patient is in atrial flutter today with a rapid ventricular response. Her blood pressure is soft with a systolic of 95. I will admit for rate control. Continue metoprolol. Change Cardizem to 60 mg by mouth every 6 hours. Increase AV nodal blocking agents as needed. Continue Coumadin. Note she was in atrial fibrillation on December 9 and I therefore think that atrial flutter ablation would not be indicated. Note echocardiogram recently showed normal LV function.she will need lifelong Coumadin given recent TIA.    Revision History       Date/Time User Action    > 01/04/2014  4:50 PM Lelon Perla, MD Edit      01/04/2014  4:48 PM Lelon Perla, MD Create              HYPERTENSION, BENIGN - Lelon Perla, MD at 01/04/2014  4:49 PM    Status: Written Related Problem: HYPERTENSION, BENIGN    Patient's blood pressure is low. Continue metoprolol for rate control of atrial flutter. Increase Cardizem to 60 mg every 6 hours. Discontinue lisinopril HCT to make BP room for additional AV nodal blocking agents.         TOBACCO ABUSE - Lelon Perla, MD at 01/04/2014  4:49 PM    Status: Written Related Problem: TOBACCO ABUSE    Patient counseled on discontinuing.         ATRIAL FIBRILLATION - Lelon Perla, MD at 01/04/2014  4:50 PM    Status: Written Related Problem: ATRIAL FIBRILLATION    Patient does have a history of atrial  fibrillation. She is in atrial flutter today. See plan under atrial flutter. Check TSH.         Diabetes mellitus - Lelon Perla, MD at 01/04/2014  4:51 PM    Status: Written Related Problem: Diabetes mellitus    Continue present medications and follow CBGs.         HYPERLIPIDEMIA - Lelon Perla, MD at 01/04/2014  4:51 PM    Status: Written Related Problem: HYPERLIPIDEMIA    Continue statin.         COPD - Lelon Perla, MD at 01/04/2014  4:52 PM    Status: Written Related Problem: COPD    Continue bronchodilators.          Encounter-Level Documents:    Electronic signature on 01/04/2014 3:59 PM          Vitals - Last Recorded    BP Pulse Ht Wt BMI      92/68 133 5' 4.5" (1.638 m) 179 lb (81.194 kg) 30.26 kg/m2

## 2014-01-04 NOTE — Assessment & Plan Note (Signed)
Patient counseled on discontinuing. 

## 2014-01-04 NOTE — Assessment & Plan Note (Signed)
Continue statin. 

## 2014-01-04 NOTE — Assessment & Plan Note (Signed)
Patient does have a history of atrial fibrillation. She is in atrial flutter today. See plan under atrial flutter. Check TSH.

## 2014-01-04 NOTE — Assessment & Plan Note (Signed)
Patient's blood pressure is low. Continue metoprolol for rate control of atrial flutter. Increase Cardizem to 60 mg every 6 hours. Discontinue lisinopril HCT to make BP room for additional AV nodal blocking agents.

## 2014-01-04 NOTE — Progress Notes (Signed)
HPI: followup atrial fibrillation. Cardiac catheterization in March of 2007 showed normal LV function and normal coronary arteries. Nuclear study in October of 2011 showed an ejection fraction of 73% and normal perfusion. Carotid Dopplers in December of 2014 showed 1-39% bilateral stenosis. Echocardiogram in December of 2014 showed normal LV function, mild left ventricular hypertrophy and grade 2 diastolic dysfunction. TSH in December 2014 1.556. Patient recently presented to the emergency room with question TIA. She was noted to be in atrial fibrillation but converted spontaneously. She has been treated with Cardizem/toprol and Coumadin. She was noted to have a microcytic anemia which was to be evaluated by primary care. Since DC, she has mild dyspnea on exertion but no orthopnea, PND, pedal edema, palpitations, syncope, chest pain or bleeding.  Current Outpatient Prescriptions  Medication Sig Dispense Refill  . albuterol (PROVENTIL) (2.5 MG/3ML) 0.083% nebulizer solution Take 2.5 mg by nebulization 2 (two) times daily.       [name] ALPRAZolam (XANAX) 0.5 MG tablet Take 0.5 mg by mouth at bedtime.       . cetirizine (ZYRTEC) 10 MG tablet Take 10 mg by mouth at bedtime.       [name] diltiazem (CARDIZEM) 60 MG tablet TAKE 60MG  ( TOTAL OF  180MG  ) TABLET DAILY      . DULoxetine (CYMBALTA) 60 MG capsule Take 120 mg by mouth daily.      . Fluticasone-Salmeterol (ADVAIR DISKUS) 250-50 MCG/DOSE AEPB Inhale 1 puff into the lungs 2 (two) times daily.       . furosemide (LASIX) 40 MG tablet Take 40 mg by mouth daily.        [name] glucose blood test strip 1 each by Other route as needed for other. Use as instructed      . HYDROcodone-acetaminophen (NORCO) 7.5-325 MG per tablet Take 1 tablet by mouth every 6 (six) hours as needed for moderate pain.      [name] ibuprofen (ADVIL,MOTRIN) 800 MG tablet Take 800 mg by mouth every 8 (eight) hours as needed.        . levalbuterol (XOPENEX HFA) 45 MCG/ACT inhaler Inhale 2 puffs  into the lungs every 4 (four) hours as needed.       . metFORMIN (GLUMETZA) 1000 MG (MOD) 24 hr tablet Take 1,000 mg by mouth 2 (two) times daily with a meal.      . metoprolol succinate (TOPROL-XL) 25 MG 24 hr tablet Take 25 mg by mouth every morning.       . olmesartan-hydrochlorothiazide (BENICAR HCT) 20-12.5 MG per tablet Take 1 tablet by mouth daily.        [name] omeprazole (PRILOSEC) 40 MG capsule Take 40 mg by mouth 2 (two) times daily.        . simvastatin (ZOCOR) 40 MG tablet Take 40 mg by mouth every morning.       . solifenacin (VESICARE) 10 MG tablet Take 10 mg by mouth daily.      [name] tiotropium (SPIRIVA) 18 MCG inhalation capsule Place 18 mcg into inhaler and inhale daily.       [name] trimethoprim (TRIMPEX) 100 MG tablet Take 100 mg by mouth at bedtime.      [name] warfarin (COUMADIN) 3 MG tablet TAKE AS DIRECTED      . warfarin (COUMADIN) 4 MG tablet Take 4 mg by mouth every morning. As directed       No current facility-administered medications for this visit.     Past Medical History  Diagnosis Date  . Atrial fibrillation   . Hypertension     benign  . Hiatal hernia   . Adenomatous polyp of colon   . Acute asthmatic bronchitis   . GERD (gastroesophageal reflux disease)     EGD neg 03/07/2008, but prev required dilation  . COPD (chronic obstructive pulmonary disease)   . IBS (irritable bowel syndrome)     chronic  . Urinary incontinence   . Esophagitis   . Chronic gastritis   . Duodenitis without hemorrhage 10/01/2001  . Diverticulosis   . Personal history of colonic adenomas 02/05/2008        . Stroke     x 2  . Arthritis   . Anemia   . Diabetes   . Hyperlipidemia   . Decreased peripheral vision of right eye     Past Surgical History  Procedure Laterality Date  . Cholecystectomy    . Hysterectomy/bso/bladder repair    . Breast surgery    . Colonoscopy    . Upper gi endoscopy      History   Social History  . Marital Status: Legally Separated    Spouse Name: N/A      Number of Children: N/A  . Years of Education: N/A   Occupational History  . Not on file.   Social History Main Topics  . Smoking status: Smoker, Current Status Unknown -- 2.00 packs/day for 50 years    Types: Cigarettes  . Smokeless tobacco: Never Used  . Alcohol Use: No  . Drug Use: No  . Sexual Activity: Not on file   Other Topics Concern  . Not on file   Social History Narrative  . No narrative on file    ROS: chronic productive cough but no fevers or chills, hemoptysis, dysphasia, odynophagia, melena, hematochezia, dysuria, hematuria, rash, seizure activity, orthopnea, PND, pedal edema, claudication. Remaining systems are negative.  Physical Exam: Well-developed well-nourished in no acute distress.  Back is normal Not depressed. Skin is warm and dry.  HEENT is normal.  Neck is supple. Normal carotid upstroke. No bruits. Chest is clear to auscultation with normal expansion.  Cardiovascular exam is tachycardic and irregular, no murmurs rubs or. Abdominal exam nontender or distended. No masses palpated. No bruit noted. Note vascular medically. 2+ femoral pulses. No bruits. Extremities show no edema. 2+ dorsalis pedis pulses. neuro grossly intact  ECG atrial flutter at a rate of 133. Normal axis.

## 2014-01-04 NOTE — Progress Notes (Signed)
   Patient is feeling good with no chest pain or SOB. She is resting comfortably. Continue current treatment plan. Continue to follow.  Tyrell Antonio PA-C  Pager [phone]

## 2014-01-05 ENCOUNTER — Encounter (HOSPITAL_COMMUNITY): Payer: Self-pay | Admitting: General Practice

## 2014-01-05 LAB — LIPID PANEL
CHOLESTEROL: 145 mg/dL (ref 0–200)
HDL: 35 mg/dL — ABNORMAL LOW (ref >39)
LDL CALC: 80 mg/dL (ref 0–99)
TRIGLYCERIDES: 150 mg/dL — AB (ref <150)
Total CHOL/HDL Ratio: 4.1 RATIO
VLDL: 30 mg/dL (ref 0–40)

## 2014-01-05 LAB — CBC
HCT: 36 % (ref 36.0–46.0)
HEMOGLOBIN: 10.6 g/dL — AB (ref 12.0–15.0)
MCH: 22.1 pg — AB (ref 26.0–34.0)
MCHC: 29.4 g/dL — AB (ref 30.0–36.0)
MCV: 75 fL — ABNORMAL LOW (ref 78.0–100.0)
Platelets: 279 10*3/uL (ref 150–400)
RBC: 4.8 MIL/uL (ref 3.87–5.11)
RDW: 19.4 % — ABNORMAL HIGH (ref 11.5–15.5)
WBC: 9 10*3/uL (ref 4.0–10.5)

## 2014-01-05 LAB — GLUCOSE, CAPILLARY
GLUCOSE-CAPILLARY: 139 mg/dL — AB (ref 70–99)
GLUCOSE-CAPILLARY: 122 mg/dL — AB (ref 70–99)
GLUCOSE-CAPILLARY: 107 mg/dL — AB (ref 70–99)
GLUCOSE-CAPILLARY: 156 mg/dL — AB (ref 70–99)

## 2014-01-05 LAB — PROTIME-INR
INR: 4.69 — ABNORMAL HIGH (ref 0.00–1.49)
Prothrombin Time: 42.3 seconds — ABNORMAL HIGH (ref 11.6–15.2)

## 2014-01-05 LAB — MAGNESIUM: Magnesium: 1.8 mg/dL (ref 1.5–2.5)

## 2014-01-05 LAB — HEMOGLOBIN A1C
HEMOGLOBIN A1C: 7 % — AB (ref <5.7)
MEAN PLASMA GLUCOSE: 154 mg/dL — AB (ref <117)

## 2014-01-05 LAB — BASIC METABOLIC PANEL
BUN: 17 mg/dL (ref 6–23)
CO2: 24 meq/L (ref 19–32)
Calcium: 9.3 mg/dL (ref 8.4–10.5)
Chloride: 98 mEq/L (ref 96–112)
Creatinine, Ser: 1.2 mg/dL — ABNORMAL HIGH (ref 0.50–1.10)
GFR calc Af Amer: 55 mL/min — ABNORMAL LOW (ref >90)
GFR, EST NON AFRICAN AMERICAN: 47 mL/min — AB (ref >90)
GLUCOSE: 114 mg/dL — AB (ref 70–99)
Potassium: 3.7 mEq/L (ref 3.7–5.3)
Sodium: 140 mEq/L (ref 137–147)

## 2014-01-05 LAB — TSH: TSH: 1.817 u[IU]/mL (ref 0.350–4.500)

## 2014-01-05 MED ORDER — DILTIAZEM HCL 60 MG PO TABS
60.0000 mg | ORAL_TABLET | Freq: Four times a day (QID) | ORAL | Status: DC
  Administered 2014-01-05: 60 mg via ORAL
  Filled 2014-01-05 (×6): qty 1

## 2014-01-05 MED ORDER — METOPROLOL SUCCINATE ER 25 MG PO TB24
25.0000 mg | ORAL_TABLET | Freq: Every morning | ORAL | Status: DC
  Administered 2014-01-05 – 2014-01-06 (×2): 25 mg via ORAL
  Filled 2014-01-05 (×2): qty 1

## 2014-01-05 MED ORDER — WARFARIN - PHARMACIST DOSING INPATIENT: Freq: Every day | Status: DC

## 2014-01-05 MED ORDER — DULOXETINE HCL 60 MG PO CPEP
120.0000 mg | ORAL_CAPSULE | Freq: Every day | ORAL | Status: DC
  Administered 2014-01-05 – 2014-01-06 (×2): 120 mg via ORAL
  Filled 2014-01-05 (×2): qty 2

## 2014-01-05 MED ORDER — DILTIAZEM HCL 90 MG PO TABS
90.0000 mg | ORAL_TABLET | Freq: Four times a day (QID) | ORAL | Status: DC
  Administered 2014-01-05 (×3): 90 mg via ORAL
  Filled 2014-01-05 (×8): qty 1

## 2014-01-05 MED ORDER — TIOTROPIUM BROMIDE MONOHYDRATE 18 MCG IN CAPS
18.0000 ug | ORAL_CAPSULE | Freq: Every day | RESPIRATORY_TRACT | Status: DC
  Administered 2014-01-05 – 2014-01-06 (×2): 18 ug via RESPIRATORY_TRACT
  Filled 2014-01-05: qty 5

## 2014-01-05 MED ORDER — LORATADINE 10 MG PO TABS
10.0000 mg | ORAL_TABLET | Freq: Every day | ORAL | Status: DC
  Administered 2014-01-05 – 2014-01-06 (×2): 10 mg via ORAL
  Filled 2014-01-05 (×2): qty 1

## 2014-01-05 MED ORDER — PANTOPRAZOLE SODIUM 40 MG PO TBEC
80.0000 mg | DELAYED_RELEASE_TABLET | Freq: Every day | ORAL | Status: DC
  Administered 2014-01-05 – 2014-01-06 (×2): 80 mg via ORAL
  Filled 2014-01-05 (×2): qty 2

## 2014-01-05 MED ORDER — METFORMIN HCL ER 500 MG PO TB24
1000.0000 mg | ORAL_TABLET | Freq: Two times a day (BID) | ORAL | Status: DC
  Administered 2014-01-05 (×2): 1000 mg via ORAL
  Filled 2014-01-05 (×5): qty 2

## 2014-01-05 MED ORDER — MOMETASONE FURO-FORMOTEROL FUM 100-5 MCG/ACT IN AERO
2.0000 | INHALATION_SPRAY | Freq: Two times a day (BID) | RESPIRATORY_TRACT | Status: DC
  Administered 2014-01-05 – 2014-01-06 (×3): 2 via RESPIRATORY_TRACT
  Filled 2014-01-05: qty 8.8

## 2014-01-05 MED ORDER — ACETAMINOPHEN 325 MG PO TABS: 650.0000 mg | ORAL_TABLET | ORAL | Status: DC | PRN

## 2014-01-05 MED ORDER — ONDANSETRON HCL 4 MG/2ML IJ SOLN: 4.0000 mg | Freq: Four times a day (QID) | INTRAMUSCULAR | Status: DC | PRN

## 2014-01-05 MED ORDER — LEVALBUTEROL TARTRATE 45 MCG/ACT IN AERO
2.0000 | INHALATION_SPRAY | RESPIRATORY_TRACT | Status: DC | PRN
Start: 2014-01-05 — End: 2014-01-05

## 2014-01-05 MED ORDER — FUROSEMIDE 40 MG PO TABS
40.0000 mg | ORAL_TABLET | Freq: Every day | ORAL | Status: DC
  Administered 2014-01-05 – 2014-01-06 (×2): 40 mg via ORAL
  Filled 2014-01-05 (×2): qty 1

## 2014-01-05 MED ORDER — ALBUTEROL SULFATE (2.5 MG/3ML) 0.083% IN NEBU
2.5000 mg | INHALATION_SOLUTION | Freq: Two times a day (BID) | RESPIRATORY_TRACT | Status: DC
  Administered 2014-01-05 – 2014-01-06 (×3): 2.5 mg via RESPIRATORY_TRACT
  Filled 2014-01-05 (×3): qty 3

## 2014-01-05 MED ORDER — TRIMETHOPRIM 100 MG PO TABS
100.0000 mg | ORAL_TABLET | Freq: Every day | ORAL | Status: DC
  Administered 2014-01-05: 100 mg via ORAL
  Filled 2014-01-05 (×2): qty 1

## 2014-01-05 MED ORDER — DARIFENACIN HYDROBROMIDE ER 15 MG PO TB24
15.0000 mg | ORAL_TABLET | Freq: Every day | ORAL | Status: DC
  Administered 2014-01-05 – 2014-01-06 (×2): 15 mg via ORAL
  Filled 2014-01-05 (×2): qty 1

## 2014-01-05 MED ORDER — SODIUM CHLORIDE 0.9 % IJ SOLN: 3.0000 mL | INTRAMUSCULAR | Status: DC | PRN

## 2014-01-05 MED ORDER — LEVALBUTEROL HCL 1.25 MG/0.5ML IN NEBU
1.2500 mg | INHALATION_SOLUTION | RESPIRATORY_TRACT | Status: DC | PRN
  Filled 2014-01-05: qty 0.5

## 2014-01-05 MED ORDER — SODIUM CHLORIDE 0.9 % IV SOLN: 250.0000 mL | INTRAVENOUS | Status: DC

## 2014-01-05 MED ORDER — ALPRAZOLAM 0.5 MG PO TABS
0.5000 mg | ORAL_TABLET | Freq: Every day | ORAL | Status: DC
  Administered 2014-01-05: 0.5 mg via ORAL
  Filled 2014-01-05: qty 1

## 2014-01-05 MED ORDER — SODIUM CHLORIDE 0.9 % IJ SOLN
3.0000 mL | Freq: Two times a day (BID) | INTRAMUSCULAR | Status: DC
  Administered 2014-01-05 – 2014-01-06 (×2): 3 mL via INTRAVENOUS

## 2014-01-05 MED ORDER — SIMVASTATIN 40 MG PO TABS
40.0000 mg | ORAL_TABLET | Freq: Every morning | ORAL | Status: DC
  Administered 2014-01-05: 40 mg via ORAL
  Filled 2014-01-05 (×2): qty 1

## 2014-01-05 MED ORDER — HYDROCODONE-ACETAMINOPHEN 7.5-325 MG PO TABS: 1.0000 | ORAL_TABLET | Freq: Four times a day (QID) | ORAL | Status: DC | PRN

## 2014-01-05 NOTE — Discharge Instructions (Addendum)
Information on my medicine - Coumadin   (Warfarin)  This medication education was reviewed with me or my healthcare representative as part of my discharge preparation.  The pharmacist that spoke with me during my hospital stay was:  [facility], Margot Chimes, [facility]  Why was Coumadin prescribed for you? Coumadin was prescribed for you because you have a blood clot or a medical condition that can cause an increased risk of forming blood clots. Blood clots can cause serious health problems by blocking the flow of blood to the heart, lung, or brain. Coumadin can prevent harmful blood clots from forming. As a reminder your indication for Coumadin is:   Stroke Prevention Because Of Atrial Fibrillation  What test will check on my response to Coumadin? While on Coumadin (warfarin) you will need to have an INR test regularly to ensure that your dose is keeping you in the desired range. The INR (international normalized ratio) number is calculated from the result of the laboratory test called prothrombin time (PT).  If an INR APPOINTMENT HAS NOT ALREADY BEEN MADE FOR YOU please schedule an appointment to have this lab work done by your health care provider within 7 days. Your INR goal is usually a number between:  2 to 3 or your provider may give you a more narrow range like 2-2.5.  Ask your health care provider during an office visit what your goal INR is.  What  do you need to  know  About  COUMADIN? Take Coumadin (warfarin) exactly as prescribed by your healthcare provider about the same time each day.  DO NOT stop taking without talking to the doctor who prescribed the medication.  Stopping without other blood clot prevention medication to take the place of Coumadin may increase your risk of developing a new clot or stroke.  Get refills before you run out.  What do you do if you miss a dose? If you miss a dose, take it as soon as you remember on the same day then continue your regularly scheduled regimen  the next day.  Do not take two doses of Coumadin at the same time.  Important Safety Information A possible side effect of Coumadin (Warfarin) is an increased risk of bleeding. You should call your healthcare provider right away if you experience any of the following:   Bleeding from an injury or your nose that does not stop.   Unusual colored urine (red or dark brown) or unusual colored stools (red or black).   Unusual bruising for unknown reasons.   A serious fall or if you hit your head (even if there is no bleeding).  Some foods or medicines interact with Coumadin (warfarin) and might alter your response to warfarin. To help avoid this:   Eat a balanced diet, maintaining a consistent amount of Vitamin K.   Notify your provider about major diet changes you plan to make.   Avoid alcohol or limit your intake to 1 drink for women and 2 drinks for men per day. (1 drink is 5 oz. wine, 12 oz. beer, or 1.5 oz. liquor.)  Make sure that ANY health care provider who prescribes medication for you knows that you are taking Coumadin (warfarin).  Also make sure the healthcare provider who is monitoring your Coumadin knows when you have started a new medication including herbals and non-prescription products.  Coumadin (Warfarin)  Major Drug Interactions  Increased Warfarin Effect Decreased Warfarin Effect  Alcohol (large quantities) Antibiotics (esp. Septra/Bactrim, Flagyl, Cipro) Amiodarone (Cordarone) Aspirin (  ASA) Cimetidine (Tagamet) Megestrol (Megace) NSAIDs (ibuprofen, naproxen, etc.) Piroxicam (Feldene) Propafenone (Rythmol SR) Propranolol (Inderal) Isoniazid (INH) Posaconazole (Noxafil) Barbiturates (Phenobarbital) Carbamazepine (Tegretol) Chlordiazepoxide (Librium) Cholestyramine (Questran) Griseofulvin Oral Contraceptives Rifampin Sucralfate (Carafate) Vitamin K   Coumadin (Warfarin) Major Herbal Interactions  Increased Warfarin Effect Decreased Warfarin Effect   Garlic Ginseng Ginkgo biloba Coenzyme Q10 Green tea St. Johns wort    Coumadin (Warfarin) FOOD Interactions  Eat a consistent number of servings per week of foods HIGH in Vitamin K (1 serving =  cup)  Collards (cooked, or boiled & drained) Kale (cooked, or boiled & drained) Mustard greens (cooked, or boiled & drained) Parsley *serving size only =  cup Spinach (cooked, or boiled & drained) Swiss chard (cooked, or boiled & drained) Turnip greens (cooked, or boiled & drained)  Eat a consistent number of servings per week of foods MEDIUM-HIGH in Vitamin K (1 serving = 1 cup)  Asparagus (cooked, or boiled & drained) Broccoli (cooked, boiled & drained, or raw & chopped) Brussel sprouts (cooked, or boiled & drained) *serving size only =  cup Lettuce, raw (green leaf, endive, romaine) Spinach, raw Turnip greens, raw & chopped   These websites have more information on Coumadin (warfarin):  FailFactory.se; VeganReport.com.au;   Atrial Fibrillation Atrial fibrillation is a type of irregular heart rhythm (arrhythmia). During atrial fibrillation, the upper chambers of the heart (atria) quiver continuously in a chaotic pattern. This causes an irregular and often rapid heart rate.  Atrial fibrillation is the result of the heart becoming overloaded with disorganized signals that tell it to beat. These signals are normally released one at a time by a part of the right atrium called the sinoatrial node. They then travel from the atria to the lower chambers of the heart (ventricles), causing the atria and ventricles to contract and pump blood as they pass. In atrial fibrillation, parts of the atria outside of the sinoatrial node also release these signals. This results in two problems. First, the atria receive so many signals that they do not have time to fully contract. Second, the ventricles, which can only receive one signal at a time, beat irregularly and out of rhythm with  the atria.  There are three types of atrial fibrillation:   Paroxysmal Paroxysmal atrial fibrillation starts suddenly and stops on its own within a week.   Persistent Persistent atrial fibrillation lasts for more than a week. It may stop on its own or with treatment.   Permanent Permanent atrial fibrillation does not go away. Episodes of atrial fibrillation may lead to permanent atrial fibrillation.  Atrial fibrillation can prevent your heart from pumping blood normally. It increases your risk of stroke and can lead to heart failure.  CAUSES   Heart conditions, including a heart attack, heart failure, coronary artery disease, and heart valve conditions.   Inflammation of the sac that surrounds the heart (pericarditis).   Blockage of an artery in the lungs (pulmonary embolism).   Pneumonia or other infections.   Chronic lung disease.   Thyroid problems, especially if the thyroid is overactive (hyperthyroidism).   Caffeine, excessive alcohol use, and use of some illegal drugs.   Use of some medications, including certain decongestants and diet pills.   Heart surgery.   Birth defects.  Sometimes, no cause can be found. When this happens, the atrial fibrillation is called lone atrial fibrillation. The risk of complications from atrial fibrillation increases if you have lone atrial fibrillation and you are age 33 years or older. RISK  FACTORS  Heart failure.  Coronary artery disease  Diabetes mellitus.   High blood pressure (hypertension).   Obesity.   Other arrhythmias.   Increased age. SYMPTOMS   A feeling that your heart is beating rapidly or irregularly.   A feeling of discomfort or pain in your chest.   Shortness of breath.   Sudden lightheadedness or weakness.   Getting tired easily when exercising.   Urinating more often than normal (mainly when atrial fibrillation first begins).  In paroxysmal atrial fibrillation, symptoms may start  and suddenly stop. DIAGNOSIS  Your caregiver may be able to detect atrial fibrillation when taking your pulse. Usually, testing is needed to diagnosis atrial fibrillation. Tests may include:   Electrocardiography. During this test, the electrical impulses of your heart are recorded while you are lying down.   Echocardiography. During echocardiography, sound waves are used to evaluate how blood flows through your heart.   Stress test. There is more than one type of stress test. If a stress test is needed, ask your caregiver about which type is best for you.   Chest X-ray exam.   Blood tests.   Computed tomography (CT).  TREATMENT   Treating any underlying conditions. For example, if you have an overactive thyroid, treating the condition may correct atrial fibrillation.   Medication. Medications may be given to control a rapid heart rate or to prevent blood clots, heart failure, or a stroke.   Procedure to correct the rhythm of the heart:  Electrical cardioversion. During electrical cardioversion, a controlled, low-energy shock is delivered to the heart through your skin. If you have chest pain, very low pressure blood pressure, or sudden heart failure, this procedure may need to be done as an emergency.  Catheter ablation. During this procedure, heart tissues that send the signals that cause atrial fibrillation are destroyed.  Maze or minimaze procedure. During this surgery, thin lines of heart tissue that carry the abnormal signals are destroyed. The maze procedure is an open-heart surgery. The minimaze procedure is a minimally invasive surgery. This means that small cuts are made to access the heart instead of a large opening.  Pulmonary venous isolation. During this surgery, tissue around the veins that carry blood from the lungs (pulmonary veins) is destroyed. This tissue is thought to carry the abnormal signals. HOME CARE INSTRUCTIONS   Take medications as directed by your  caregiver.  Only take medications that your caregiver approves. Some medications can make atrial fibrillation worse or recur.  If blood thinners were prescribed by your caregiver, take them exactly as directed. Too much can cause bleeding. Too little and you will not have the needed protection against stroke and other problems.  Perform blood tests at home if directed by your caregiver.  Perform blood tests exactly as directed.   Quit smoking if you smoke.   Do not drink alcohol.   Do not drink caffeinated beverages such as coffee, soda, and some teas. You may drink decaffeinated coffee, soda, or tea.   Maintain a healthy weight. Do not use diet pills unless your caregiver approves. They may make heart problems worse.   Follow diet instructions as directed by your caregiver.   Exercise regularly as directed by your caregiver.   Keep all follow-up appointments. PREVENTION  The following substances can cause atrial fibrillation to recur:   Caffeinated beverages.   Alcohol.   Certain medications, especially those used for breathing problems.   Certain herbs and herbal medications, such as those containing ephedra  or ginseng.  Illegal drugs such as cocaine and amphetamines. Sometimes medications are given to prevent atrial fibrillation from recurring. Proper treatment of any underlying condition is also important in helping prevent recurrence.  SEEK MEDICAL CARE IF:  You notice a change in the rate, rhythm, or strength of your heartbeat.   You suddenly begin urinating more frequently.   You tire more easily when exerting yourself or exercising.  SEEK IMMEDIATE MEDICAL CARE IF:   You develop chest pain, abdominal pain, sweating, or weakness.  You feel sick to your stomach (nauseous).  You develop shortness of breath.  You suddenly develop swollen feet and ankles.  You feel dizzy.  You face or limbs feel numb or weak.  There is a change in your vision  or speech. MAKE SURE YOU:   Understand these instructions.  Will watch your condition.  Will get help right away if you are not doing well or get worse. Document Released: 12/16/2005 Document Revised: 04/12/2013 Document Reviewed: 01/26/2013 [facility] Patient Information 2014 [place]. Electrical Cardioversion Cardioversion is the delivery of a jolt of electricity to change the rhythm of the heart. Sticky patches or metal paddles are placed on the chest to deliver the electricity from a device. This is done to restore a normal rhythm. A rhythm that is too fast or not regular keeps the heart from pumping well. Compared to medicines used to change an abnormal rhythm (arrhythmia), cardioversion is faster and works better.  WHEN WOULD THIS BE DONE?  In an emergency:  There is low or no blood pressure as a result of the heart rhythm.  Normal rhythm must be restored as fast as possible to protect the brain and heart from further damage.  It may save a life.  For less serious heart rhythms, such as atrial fibrillation or flutter, in which:  The heart is beating too fast or is not regular.  The heart is still able to pump enough blood, but not as well as it should.  Medicine to change the rhythm has not worked.  It is safe to wait in order to allow time for preparation. LET YOUR CAREGIVER KNOW ABOUT:   Medicines taken, including herbs, eyedrops, over-the-counter medicines, and creams.  History of abnormal heartbeats   RISKS AND COMPLICATIONS  Clots may form in your heart if you have an arrhythmia. In rare cases, cardioversion can cause a clot to break free and travel to other parts of the body. This could cause a stroke or other problems.  BEFORE THE PROCEDURE   You may have tests to detect blood clots in your heart and evaluate heart function.  You may start taking blood thinners (anticoagulants) so your blood does not clot as easily.  Other medicines may be given to  help your heart work better. PROCEDURE (SCHEDULED)  You will be given medicine through an intravenous (IV) access to reduce discomfort and make you sleepy (sedative).  Your whole body may move when the shock is delivered. Your chest may feel sore.  You may be able to go home after a few hours. Your heart rhythm will be watched to make sure it does not change. HOME CARE INSTRUCTIONS   Only take medicine as directed by your caregiver. Be sure you understand how and when to take your medicine.  Learn how to feel your pulse and check it often.  Limit your activity for 48 hours.  Avoid caffeine and other stimulants as directed. SEEK MEDICAL CARE IF:   You feel  like your heart is beating too fast or your pulse is not regular.  You have any questions about your medicines.  You have bleeding that will not stop. SEEK IMMEDIATE MEDICAL CARE IF:   You are dizzy or feel faint.  It is hard to breathe or you feel short of breath.  There is a change in discomfort in your chest.  Your speech is slurred or you have trouble moving your arm or leg on one side.  You get a muscle cramp.  Your fingers or toes turn cold or blue. MAKE SURE YOU:   Understand these instructions.  Will watch your condition.  Will get help right away if you are not doing well or get worse. Document Released: 12/06/2002 Document Revised: 04/12/2013 Document Reviewed: 06/30/2013 [facility] Patient Information 2014 [place].

## 2014-01-05 NOTE — Progress Notes (Signed)
INR records obtained from [facility] [([phone]) [phone]]:  11/23/2013: 2.47 12/07/2013:   2.57 ([facility]) 12/08/2013: 2.59 ([facility]) 12/09/2013: 2.42 ([facility]) 12/21/2013: 4.15 12/27/2013: 3.86 12/31/2013:     3.69  Unable to schedule DCCV today 2/2 scheduling conflict with anesthesia.  Patient scheduled for bedside DCCV tomorrow morning at 11:30 AM with Dr. Stanford Breed.

## 2014-01-05 NOTE — Progress Notes (Signed)
    Subjective:  Denies CP or dyspnea   Objective:  Filed Vitals:   01/04/14 2020 01/05/14 0100 01/05/14 0514 01/05/14 0814  BP: 95/61  126/75   Pulse: 131  133   Temp: 97.8 F (36.6 C)  97.9 F (36.6 C)   TempSrc: Oral  Oral   Height:  5' 4.5" (1.638 m)    Weight:  179 lb (81.194 kg) 173 lb 8 oz (78.7 kg)   SpO2: 95%  91% 96%    Intake/Output from previous day: No intake or output data in the 24 hours ending 01/05/14 0857  Physical Exam: Physical exam: Well-developed well-nourished in no acute distress.  Skin is warm and dry.  HEENT is normal.  Neck is supple.  Chest with mild rhonchi Cardiovascular exam is tachycardic and irregular Abdominal exam nontender or distended. No masses palpated. Extremities show no edema. neuro grossly intact    Lab Results: Basic Metabolic Panel:  Recent Labs  01/05/14 0540  NA 140  K 3.7  CL 98  CO2 24  GLUCOSE 114*  BUN 17  CREATININE 1.20*  CALCIUM 9.3   CBC:  Recent Labs  01/05/14 0540  WBC 9.0  HGB 10.6*  HCT 36.0  MCV 75.0*  PLT 279    Assessment/Plan:  1 Atrial flutter - Patient remains in atrial flutter today with a rapid ventricular response. Her blood pressure has improved. Continue metoprolol. Change Cardizem to 90 mg by mouth every 6 hours. Continue Coumadin. Note she was in atrial fibrillation on December 9 and I therefore think that atrial flutter ablation would not be indicated. Will obtain records from [facility] (Dr Allean Found) who is following coumadin; if INR has been therapeutic for 3 consecutive weeks, will proceed with DCCV in AM. If not, proceed with TEE/DCCV tomorrow. Would then add flecanide to maintain sinus (no h/o CAD; would need ETT as outpatient to exclude exercise induced VT). Note echocardiogram recently showed normal LV function. She will need lifelong Coumadin given recent TIA.  2 HYPERTENSION, BENIGN - Patient's blood pressure is improved; continue metoprolol and cardizem; continue off  lisinopril HCT. 3 TOBACCO ABUSE - Patient counseled on discontinuing.  4 ATRIAL FIBRILLATION - Patient does have a history of atrial fibrillation. She is in atrial flutter now. See plan under atrial flutter. Await TSH.  5 Diabetes mellitus - Continue present medications and follow CBGs.  6 HYPERLIPIDEMIA - Continue statin.  7 COPD - Continue bronchodilators.  8 Microcytic anemia - patient to fu with primary care following DC.  Kirk Ruths 01/05/2014, 8:57 AM

## 2014-01-05 NOTE — Progress Notes (Signed)
ANTICOAGULATION CONSULT NOTE - Follow Up Consult  Pharmacy Consult for Coumadin Indication: atrial fibrillation  Allergies  Allergen Reactions  . Latex Hives    Patient Measurements: Height: 5' 4.5" (163.8 cm) Weight: 173 lb 8 oz (78.7 kg) IBW/kg (Calculated) : 55.85  Vital Signs: Temp: 97.9 F (36.6 C) (01/07 0514) Temp src: Oral (01/07 0514) BP: 107/73 mmHg (01/07 1020) Pulse Rate: 150 (01/07 1020)  Labs:  Recent Labs  01/05/14 0540 01/05/14 0541  HGB 10.6*  --   HCT 36.0  --   PLT 279  --   LABPROT  --  42.3*  INR  --  4.69*  CREATININE 1.20*  --     Estimated Creatinine Clearance: 49.2 ml/min (by C-G formula based on Cr of 1.2).  Assessment: 63 y/o female on chronic Coumadin for Afib. INR is supratherapeutic at 4.69 this morning. Plan is for DCCV tomorrow. Likely needs another decrease in weekly dose.  Home dose: 3 mg daily except 4 mg on MWF; changed on 1/5 from 4 mg daily except 3 mg MWF (only 4 % decrease)  Goal of Therapy:  INR 2-3 Monitor platelets by anticoagulation protocol: Yes   Plan:  -No Coumadin tonight -INR daily -Monitor for s/sx bleeding  [facility], Pharm.D., BCPS Clinical Pharmacist Pager: [phone] 01/05/2014 10:48 AM

## 2014-01-05 NOTE — Progress Notes (Signed)
ANTICOAGULATION CONSULT NOTE - Initial Consult  Pharmacy Consult for heparin Indication: atrial fibrillation  Allergies  Allergen Reactions  . Latex Hives    Patient Measurements: Height: 5' 4.5" (163.8 cm) Weight: 179 lb (81.194 kg) IBW/kg (Calculated) : 55.85  Vital Signs: Temp: 97.8 F (36.6 C) (01/06 2020) Temp src: Oral (01/06 2020) BP: 95/61 mmHg (01/06 2020) Pulse Rate: 131 (01/06 2020)   Medical History: Past Medical History  Diagnosis Date  . Atrial fibrillation   . Hypertension     benign  . Hiatal hernia   . Adenomatous polyp of colon   . Acute asthmatic bronchitis   . GERD (gastroesophageal reflux disease)     EGD neg 03/07/2008, but prev required dilation  . COPD (chronic obstructive pulmonary disease)   . IBS (irritable bowel syndrome)     chronic  . Urinary incontinence   . Esophagitis   . Chronic gastritis   . Duodenitis without hemorrhage 10/01/2001  . Diverticulosis   . Personal history of colonic adenomas 02/05/2008        . Stroke     x 2  . Arthritis   . Anemia   . Diabetes   . Hyperlipidemia   . Decreased peripheral vision of right eye     Medications:  Prescriptions prior to admission  Medication Sig Dispense Refill  . albuterol (PROVENTIL) (2.5 MG/3ML) 0.083% nebulizer solution Take 2.5 mg by nebulization 2 (two) times daily.       [name] ALPRAZolam (XANAX) 0.5 MG tablet Take 0.5 mg by mouth at bedtime.       . cetirizine (ZYRTEC) 10 MG tablet Take 10 mg by mouth at bedtime.       [name] diltiazem (CARDIZEM) 60 MG tablet TAKE 60MG  ( TOTAL OF  180MG  ) TABLET DAILY      . DULoxetine (CYMBALTA) 60 MG capsule Take 120 mg by mouth daily.      . Fluticasone-Salmeterol (ADVAIR DISKUS) 250-50 MCG/DOSE AEPB Inhale 1 puff into the lungs 2 (two) times daily.       . furosemide (LASIX) 40 MG tablet Take 40 mg by mouth daily.        [name] glucose blood test strip 1 each by Other route as needed for other. Use as instructed      . HYDROcodone-acetaminophen  (NORCO) 7.5-325 MG per tablet Take 1 tablet by mouth every 6 (six) hours as needed for moderate pain.      [name] ibuprofen (ADVIL,MOTRIN) 800 MG tablet Take 800 mg by mouth every 8 (eight) hours as needed.        . levalbuterol (XOPENEX HFA) 45 MCG/ACT inhaler Inhale 2 puffs into the lungs every 4 (four) hours as needed.       . metFORMIN (GLUMETZA) 1000 MG (MOD) 24 hr tablet Take 1,000 mg by mouth 2 (two) times daily with a meal.      . metoprolol succinate (TOPROL-XL) 25 MG 24 hr tablet Take 25 mg by mouth every morning.       . olmesartan-hydrochlorothiazide (BENICAR HCT) 20-12.5 MG per tablet Take 1 tablet by mouth daily.        [name] omeprazole (PRILOSEC) 40 MG capsule Take 40 mg by mouth 2 (two) times daily.        . simvastatin (ZOCOR) 40 MG tablet Take 40 mg by mouth every morning.       . solifenacin (VESICARE) 10 MG tablet Take 10 mg by mouth daily.      [name] tiotropium (SPIRIVA)  18 MCG inhalation capsule Place 18 mcg into inhaler and inhale daily.       [name] trimethoprim (TRIMPEX) 100 MG tablet Take 100 mg by mouth at bedtime.      [name] warfarin (COUMADIN) 3 MG tablet TAKE AS DIRECTED      . warfarin (COUMADIN) 4 MG tablet Take 4 mg by mouth every morning. As directed       Scheduled:  . albuterol  2.5 mg Nebulization BID  . ALPRAZolam  0.5 mg Oral QHS  . darifenacin  15 mg Oral Daily  . diltiazem  60 mg Oral Q6H  . DULoxetine  120 mg Oral Daily  . furosemide  40 mg Oral Daily  . loratadine  10 mg Oral Daily  . metFORMIN  1,000 mg Oral BID WC  . metoprolol succinate  25 mg Oral q morning - 10a  . mometasone-formoterol  2 puff Inhalation BID  . pantoprazole  80 mg Oral Daily  . simvastatin  40 mg Oral q morning - 10a  . tiotropium  18 mcg Inhalation Daily  . trimethoprim  100 mg Oral QHS    Assessment: 63yo female was seen in [place] today, known Afib, in Aflutter w/ RVR and soft BP in 90s in office, admitted for rate control, to continue Coumadin.  Goal of Therapy:  INR 2-3   Plan:  Will  obtain INR with am labs and monitor for dose adjustments.  Wynona Neat, PharmD, BCPS  01/05/2014,1:16 AM

## 2014-01-05 NOTE — Progress Notes (Signed)
UR completed 

## 2014-01-06 ENCOUNTER — Encounter (HOSPITAL_COMMUNITY): Payer: Medicaid Other | Admitting: Anesthesiology

## 2014-01-06 ENCOUNTER — Encounter (HOSPITAL_COMMUNITY): Payer: Self-pay | Admitting: Anesthesiology

## 2014-01-06 ENCOUNTER — Observation Stay (HOSPITAL_COMMUNITY): Payer: Medicaid Other | Admitting: Anesthesiology

## 2014-01-06 ENCOUNTER — Other Ambulatory Visit: Payer: Self-pay | Admitting: Physician Assistant

## 2014-01-06 ENCOUNTER — Encounter (HOSPITAL_COMMUNITY)
Admission: AD | Disposition: A | Discharge status: Home / Self Care | Payer: Self-pay | Source: Ambulatory Visit | Attending: Cardiology

## 2014-01-06 DIAGNOSIS — Z0389 Encounter for observation for other suspected diseases and conditions ruled out: Secondary | ICD-10-CM

## 2014-01-06 DIAGNOSIS — IMO0001 Reserved for inherently not codable concepts without codable children: Secondary | ICD-10-CM

## 2014-01-06 DIAGNOSIS — I4892 Unspecified atrial flutter: Secondary | ICD-10-CM

## 2014-01-06 DIAGNOSIS — Z7901 Long term (current) use of anticoagulants: Secondary | ICD-10-CM

## 2014-01-06 DIAGNOSIS — Z8673 Personal history of transient ischemic attack (TIA), and cerebral infarction without residual deficits: Secondary | ICD-10-CM

## 2014-01-06 DIAGNOSIS — I5189 Other ill-defined heart diseases: Secondary | ICD-10-CM | POA: Diagnosis present

## 2014-01-06 HISTORY — PX: CARDIOVERSION: SHX1299

## 2014-01-06 LAB — PROTIME-INR
INR: 3.78 — AB (ref 0.00–1.49)
Prothrombin Time: 35.9 seconds — ABNORMAL HIGH (ref 11.6–15.2)

## 2014-01-06 LAB — GLUCOSE, CAPILLARY
Glucose-Capillary: 127 mg/dL — ABNORMAL HIGH (ref 70–99)
Glucose-Capillary: 120 mg/dL — ABNORMAL HIGH (ref 70–99)

## 2014-01-06 SURGERY — CARDIOVERSION
Anesthesia: General

## 2014-01-06 MED ORDER — WARFARIN SODIUM 4 MG PO TABS: 4.0000 mg | ORAL_TABLET | ORAL | Status: DC

## 2014-01-06 MED ORDER — WARFARIN SODIUM 3 MG PO TABS: 3.0000 mg | ORAL_TABLET | ORAL | Status: DC

## 2014-01-06 MED ORDER — FLECAINIDE ACETATE 50 MG PO TABS
50.0000 mg | ORAL_TABLET | Freq: Two times a day (BID) | ORAL | Status: DC
  Administered 2014-01-06: 50 mg via ORAL
  Filled 2014-01-06 (×2): qty 1

## 2014-01-06 MED ORDER — ATORVASTATIN CALCIUM 20 MG PO TABS: 20.0000 mg | ORAL_TABLET | Freq: Every evening | ORAL | Status: DC

## 2014-01-06 MED ORDER — PROPOFOL 10 MG/ML IV BOLUS
INTRAVENOUS | Status: DC | PRN
  Administered 2014-01-06: 80 mg via INTRAVENOUS

## 2014-01-06 MED ORDER — ATORVASTATIN CALCIUM 20 MG PO TABS: 20.0000 mg | ORAL_TABLET | Freq: Every day | ORAL | Status: DC

## 2014-01-06 MED ORDER — DILTIAZEM HCL ER COATED BEADS 240 MG PO CP24: 240.0000 mg | ORAL_CAPSULE | Freq: Every day | ORAL | Status: DC

## 2014-01-06 MED ORDER — METHIMAZOLE 5 MG PO TABS
5.0000 mg | ORAL_TABLET | Freq: Every day | ORAL | Status: DC
  Filled 2014-01-06: qty 1

## 2014-01-06 MED ORDER — FLECAINIDE ACETATE 50 MG PO TABS: 50.0000 mg | ORAL_TABLET | Freq: Two times a day (BID) | ORAL | Status: DC

## 2014-01-06 MED ORDER — DILTIAZEM HCL ER COATED BEADS 240 MG PO CP24
240.0000 mg | ORAL_CAPSULE | Freq: Every day | ORAL | Status: DC
  Administered 2014-01-06: 240 mg via ORAL
  Filled 2014-01-06: qty 1

## 2014-01-06 NOTE — Progress Notes (Signed)
Pt discharged to home per MD order. Pt received and reviewed all discharge instructions and medication information including follow-up appointments and prescription information. Pt verbalized understanding. Pt IV and telemetry box removed prior to discharge. Pt alert and oriented at discharge with no complaints of pain. Pt escorted to private vehicle via wheelchair by RN. Lenna Sciara

## 2014-01-06 NOTE — Discharge Summary (Signed)
See progress notes Coley Kulikowski  

## 2014-01-06 NOTE — Procedures (Signed)
Electrical Cardioversion Procedure Note Colleen [name] [id number] [birth year]  Procedure: Electrical Cardioversion Indications:  Atrial Flutter  Procedure Details Consent: Risks of procedure as well as the alternatives and risks of each were explained to the (patient/caregiver).  Consent for procedure obtained. Time Out: Verified patient identification, verified procedure, site/side was marked, verified correct patient position, special equipment/implants available, medications/allergies/relevent history reviewed, required imaging and test results available.  Performed  Patient placed on cardiac monitor, pulse oximetry, supplemental oxygen as necessary.  Sedation given: Patient sedated by anesthesia with diprovan 80 mg IV. Pacer pads placed anterior and posterior chest.  Cardioverted 1 time(s).  Cardioverted at 120J.  Evaluation Findings: Post procedure EKG shows: NSR Complications: None Patient did tolerate procedure well.   Kirk Ruths 01/06/2014, 11:34 AM

## 2014-01-06 NOTE — Discharge Summary (Signed)
Discharge Summary   Patient ID: Colleen [name] MRN: [id number], DOB/AGE: [birth year] 63 y.o. Admit date: 01/04/2014 D/C date:     01/06/2014  Primary Care Provider: [name], NP Primary Cardiologist: Stanford Breed  Primary Discharge Diagnoses:  1. Atrial flutter with RVR - newly diagnosed, s/p DCCV 01/06/14 - maintained on Coumadin 2. Hypertension 3. Paroxysmal atrial fibrillation, last recurred 11/2013 4. Tobacco abuse 5. Diabetes mellitus 6. Hyperlipidemia 7. COPD 8. Microcytic anemia  Secondary Discharge Diagnoses:  . Hiatal hernia   . Adenomatous polyp of colon   . Acute asthmatic bronchitis   . GERD (gastroesophageal reflux disease)     EGD neg 03/07/2008, but prev required dilation  . IBS (irritable bowel syndrome)     chronic  . Urinary incontinence   . Esophagitis   . Chronic gastritis   . Duodenitis without hemorrhage 10/01/2001  . Diverticulosis   . Personal history of colonic adenomas 02/05/2008  . Decreased peripheral vision of right eye   . Pneumonia 2011  . Hyperthyroidism   . Stroke ~ 2009; 12/2013 X 3    a. Hx of stroke and ministrokes. b. TIA 12/2013, dx with AF at that time.  . Arthritis   . Depression   . On home oxygen therapy     "2L; suppose to use 24/7" (01/05/2014)   Hospital Course: [name] is a 63 y/o F with history of recent TIA, PAF, normal coronaries 2007, HTN, hyperthyroidism on methimazole, DM, anemia who was admitted from the office on 01/04/2014 with dyspnea on exertion and new rapid atrial flutter. With regard to past medical history, she has history of normal cors in 2007 and normal nuc in 09/2010. She was admitted recently in 11/2013 for possible TIA. She was noted to be in atrial fib but converted spontaneously. There is reported history of AF in the past. 2D echo 11/2013 showed mild LVH, EF 123456, grade 2 diastolic dysfunction, no RWMA and carotid dopplers showed 1-39% bilateral stenosis. She has been treated with Cardizem/Toprol and Coumadin. She  was noted to have a microcytic anemia which was to be evaluated by primary care.   On day of admission in the office, she was noted to be hypotensive with systolic blood pressure 95 and in atrial flutter with RVR rates 130's. She was admitted for rate control. Cardizem was changed to q6 dosing and titrated. Benicar HCT was discontinued to make BP room for AV nodal blocking agents.  Anticoagulation was continued. TSH WNL. Labwork was relatively unremarkable except for stable microcytic anemia as previously noted, hyperglycemia c/w DM, supratherapeutic INR and mild Cr bump of 1.2. INRs were reviewed from [facility] which were all therapeutic or above. She was felt to be a good candidate for DCCV. She underwent this today with successful restoration of NSR with PACs. Dr. Stanford Breed recommended to add flecainide 50mg  BID with ETT in 1 week to exclude exercise induced VT. Per pharmacy recommendation, will hold Coumadin today and start Coumadin 3mg  daily.   Will continue to hold Benicar HCT at discharge for blood pressure reasons and need for increased rate controlling agents. We have consolidated diltiazem to 240mg  daily which is increase from home dose of 120mg  daily. Could consider initiating low dose solitary ACEI if BP stable as outpatient given DM. Due to interaction with diltiazem, Zocor was changed to equivalent dose of Lipitor this admission. She was instructed to f/u PCP for her microcytic anemia. The patient tolerated cardioversion well. Dr. Stanford Breed has seen and examined the  patient today and feels she is stable for discharge. I have spoken with [place] and they will see her back 01/10/14 for INR recheck.   Discharge Vitals: Blood pressure 105/57, pulse 87, temperature 98.1 F (36.7 C), temperature source Oral, resp. rate 18, height 5' 4.5" (1.638 m), weight 175 lb 0.7 oz (79.4 kg), SpO2 93.00%.  Labs: Lab Results  Component Value Date   WBC 9.0 01/05/2014   HGB 10.6* 01/05/2014     HCT 36.0 01/05/2014   MCV 75.0* 01/05/2014   PLT 279 01/05/2014     Recent Labs Lab 01/05/14 0540  NA 140  K 3.7  CL 98  CO2 24  BUN 17  CREATININE 1.20*  CALCIUM 9.3  GLUCOSE 114*    Lab Results  Component Value Date   CHOL 145 01/05/2014   HDL 35* 01/05/2014   LDLCALC 80 01/05/2014   TRIG 150* 01/05/2014     Diagnostic Studies/Procedures   DCCV  Discharge Medications     Medication List    STOP taking these medications       diltiazem 60 MG tablet  Commonly known as:  CARDIZEM     ibuprofen 800 MG tablet  Commonly known as:  ADVIL,MOTRIN     olmesartan-hydrochlorothiazide 20-12.5 MG per tablet  Commonly known as:  BENICAR HCT     simvastatin 40 MG tablet  Commonly known as:  ZOCOR      TAKE these medications       ADVAIR DISKUS 250-50 MCG/DOSE Aepb  Generic drug:  Fluticasone-Salmeterol  Inhale 1 puff into the lungs 2 (two) times daily.     albuterol (2.5 MG/3ML) 0.083% nebulizer solution  Commonly known as:  PROVENTIL  Take 2.5 mg by nebulization 2 (two) times daily.     ALPRAZolam 0.5 MG tablet  Commonly known as:  XANAX  Take 0.5 mg by mouth at bedtime.     atorvastatin 20 MG tablet  Commonly known as:  LIPITOR  Take 1 tablet (20 mg total) by mouth every evening.     cetirizine 10 MG tablet  Commonly known as:  ZYRTEC  Take 10 mg by mouth at bedtime.     diltiazem 240 MG 24 hr capsule  Commonly known as:  CARDIZEM CD  Take 1 capsule (240 mg total) by mouth daily.     DULoxetine 60 MG capsule  Commonly known as:  CYMBALTA  Take 120 mg by mouth daily.     ferrous sulfate 325 (65 FE) MG tablet  Take 325 mg by mouth 2 (two) times daily with a meal.     flecainide 50 MG tablet  Commonly known as:  TAMBOCOR  Take 1 tablet (50 mg total) by mouth every 12 (twelve) hours.     furosemide 40 MG tablet  Commonly known as:  LASIX  Take 40 mg by mouth daily.     HYDROcodone-acetaminophen 7.5-325 MG per tablet  Commonly known as:  NORCO  Take 1  tablet by mouth every 6 (six) hours as needed for moderate pain.     levalbuterol 45 MCG/ACT inhaler  Commonly known as:  XOPENEX HFA  Inhale 2 puffs into the lungs every 4 (four) hours as needed for wheezing or shortness of breath.     metFORMIN 1000 MG (MOD) 24 hr tablet  Commonly known as:  GLUMETZA  Take 1,000 mg by mouth 2 (two) times daily with a meal.     methimazole 5 MG tablet  Commonly known as:  TAPAZOLE  Take 5 mg by mouth daily.     metoprolol succinate 25 MG 24 hr tablet  Commonly known as:  TOPROL-XL  Take 25 mg by mouth every morning.     omeprazole 40 MG capsule  Commonly known as:  PRILOSEC  Take 40 mg by mouth 2 (two) times daily.     solifenacin 10 MG tablet  Commonly known as:  VESICARE  Take 10 mg by mouth daily.     tiotropium 18 MCG inhalation capsule  Commonly known as:  SPIRIVA  Place 18 mcg into inhaler and inhale daily.     trimethoprim 100 MG tablet  Commonly known as:  TRIMPEX  Take 100 mg by mouth at bedtime.     warfarin 3 MG tablet  Commonly known as:  COUMADIN  Take 1 tablet (3 mg total) by mouth See admin instructions. No Coumadin today (01/06/14). Tomorrow, start 3mg  every day except Wednesdays. On Wednesdays, take 4mg .     warfarin 4 MG tablet  Commonly known as:  COUMADIN  Take 1 tablet (4 mg total) by mouth See admin instructions. No Coumadin today (01/06/14). Tomorrow, start 3mg  every day except Wednesdays. On Wednesdays, take 4mg .        Disposition   The patient will be discharged in stable condition to home. Discharge Orders   Future Appointments Provider Department Dept Phone   01/13/2014 12:00 PM Liliane Shi, PA-C [place] [phone]   [facility] [place] Office [phone]   01/17/2014 9:30 AM Rogelia Mire, NP [facility] [phone]   Future Orders Complete By Expires   Diet - low sodium heart healthy  As directed    Discharge  instructions  As directed    Comments:     Patients taking Coumadin should generally stay away from medicines like ibuprofen, Advil, Motrin, naproxen, and Aleve due to risk of stomach bleeding. You may take Tylenol as directed or talk to your primary doctor about alternatives.   Increase activity slowly  As directed      Follow-up Information   Follow up with [name], NP. (You have mild-moderate anemia - please schedule appointment with primary care doctor to discuss further evaluation. Call your doctor immediately if you notice any unusual bleeding.)    Contact information:   [place] [address] [phone]       Follow up with [facility]. (Please visit the clinic during business hours on Monday 01/10/14 for Coumadin level check)    Contact information:   [address] [place], [place] [address] [phone]      Follow up with [facility]. (Office will call you to schedule your exercise treadmill test to be done in 1-2 weeks. Please call the office if you have not heard from [place] within 3 days. This is to rule out any rhythm problems while you are on flecainide.)    Specialty:  Cardiology   Contact information:   [address], [place] [address] [phone]      Follow up with Murray Hodgkins, NP. ([place] 01/17/14 at 9:30am)    Specialty:  Nurse Practitioner   Contact information:   [address] [address] [phone]         Duration of Discharge Encounter: Greater than 30 minutes including physician and PA time.  Signed, Devyn Griffing PA-C 01/06/2014, 1:12 PM

## 2014-01-06 NOTE — Transfer of Care (Signed)
Immediate Anesthesia Transfer of Care Note  Patient: Colleen [name]  Procedure(s) Performed: Procedure(s): CARDIOVERSION (N/A)  Patient Location: Nursing Unit  Anesthesia Type:General  Level of Consciousness: awake, alert  and oriented  Airway & Oxygen Therapy: Patient Spontanous Breathing and Patient connected to face mask oxygen  Post-op Assessment: Report given to PACU RN, Post -op Vital signs reviewed and stable and Patient moving all extremities X 4  Post vital signs: Reviewed and stable  Complications: No apparent anesthesia complications

## 2014-01-06 NOTE — Anesthesia Postprocedure Evaluation (Signed)
  Anesthesia Post-op Note  Patient: Colleen [name]  Procedure(s) Performed: Procedure(s): CARDIOVERSION (N/A)  Patient Location: Nursing Unit  Anesthesia Type:General  Level of Consciousness: awake, alert  and oriented  Airway and Oxygen Therapy: Patient Spontanous Breathing and Patient connected to nasal cannula oxygen  Post-op Pain: none  Post-op Assessment: Post-op Vital signs reviewed, Patient's Cardiovascular Status Stable, Respiratory Function Stable, Patent Airway and No signs of Nausea or vomiting  Post-op Vital Signs: Reviewed and stable  Complications: No apparent anesthesia complications

## 2014-01-06 NOTE — Preoperative (Signed)
Beta Blockers   Reason not to administer Beta Blockers:Hold beta blocker due to hypotension

## 2014-01-06 NOTE — Anesthesia Procedure Notes (Signed)
Date/Time: 01/06/2014 11:44 AM Performed by: Kyung Rudd Pre-anesthesia Checklist: Patient identified, Emergency Drugs available, Suction available, Patient being monitored and Timeout performed Patient Re-evaluated:Patient Re-evaluated prior to inductionOxygen Delivery Method: Ambu bag Preoxygenation: Pre-oxygenation with 100% oxygen Intubation Type: IV induction Ventilation: Mask ventilation without difficulty

## 2014-01-06 NOTE — Progress Notes (Signed)
ANTICOAGULATION CONSULT NOTE - Follow Up Consult  Pharmacy Consult for Coumadin Indication: atrial fibrillation  Allergies  Allergen Reactions  . Latex Hives    Patient Measurements: Height: 5' 4.5" (163.8 cm) Weight: 175 lb 0.7 oz (79.4 kg) IBW/kg (Calculated) : 55.85  Vital Signs: Temp: 98.1 F (36.7 C) (01/08 0417) Temp src: Oral (01/08 0417) BP: 105/57 mmHg (01/08 0417) Pulse Rate: 87 (01/08 0417)  Labs:  Recent Labs  01/05/14 0540 01/05/14 0541 01/06/14 0520  HGB 10.6*  --   --   HCT 36.0  --   --   PLT 279  --   --   LABPROT  --  42.3* 35.9*  INR  --  4.69* 3.78*  CREATININE 1.20*  --   --     Estimated Creatinine Clearance: 49.5 ml/min (by C-G formula based on Cr of 1.2).  Assessment: 63 y/o female on chronic Coumadin for Afib. INR is supratherapeutic at 3.78 this morning. Plan is for DCCV today. Likely needs another decrease in weekly dose.  Home dose: 3 mg daily except 4 mg on MWF; changed on 1/5 from 4 mg daily except 3 mg MWF (only 4 % decrease)  Goal of Therapy:  INR 2-3 Monitor platelets by anticoagulation protocol: Yes   Plan:  -No Coumadin tonight -Consider Coumadin 3 mg daily except 4 mg once weekly upon discharge -INR daily -Monitor for s/sx bleeding  [facility], Pharm.D., BCPS Clinical Pharmacist Pager: [phone] 01/06/2014 10:18 AM

## 2014-01-06 NOTE — Progress Notes (Signed)
Subjective:  No complaints  Objective:  Vital Signs in the last 24 hours: Temp:  [97.2 F (36.2 C)-98.4 F (36.9 C)] 98.1 F (36.7 C) (01/08 0417) Pulse Rate:  [68-150] 87 (01/08 0417) Resp:  [18-22] 18 (01/08 0417) BP: (105-120)/(57-89) 105/57 mmHg (01/08 0417) SpO2:  [91 %-95 %] 93 % (01/08 0831) Weight:  [175 lb 0.7 oz (79.4 kg)] 175 lb 0.7 oz (79.4 kg) (01/08 0417)  Intake/Output from previous day:  Intake/Output Summary (Last 24 hours) at 01/06/14 0906 Last data filed at 01/06/14 0848  Gross per 24 hour  Intake      0 ml  Output      0 ml  Net      0 ml    Physical Exam: General appearance: alert, cooperative, no distress and moderately obese Lungs: decreased breath sounds Heart: irregularly irregular rhythm   Rate: 90-130  Rhythm: atrial fibrillation  Lab Results:  Recent Labs  01/05/14 0540  WBC 9.0  HGB 10.6*  PLT 279    Recent Labs  01/05/14 0540  NA 140  K 3.7  CL 98  CO2 24  GLUCOSE 114*  BUN 17  CREATININE 1.20*   No results found for this basename: TROPONINI, CK, MB,  in the last 72 hours  Recent Labs  01/06/14 0520  INR 3.78*    Imaging: Imaging results have been reviewed  Cardiac Studies:  Assessment/Plan:   Principal Problem:   Atrial fibrillation with RVR Active Problems:   TIA (transient ischemic attack) 12/14   Diabetes mellitus   PAF fib and flutter documented   Chronic anticoagulation- Coumadin   Diastolic dysfunction grade 2 by Echo 12/14   HYPERLIPIDEMIA   TOBACCO ABUSE   HYPERTENSION, BENIGN   COPD   H/O: CVA (cerebrovascular accident)   Normal coronary arteries- 2007    PLAN: DCCV today. ? Home later. Will decide Diltiazem dose after discharge.  Kerin Ransom PA-C [facility] [phone] 01/06/2014, 9:06 AM  As above, patient seen and examined; no chest pain or dyspnea. 1 Atrial flutter - Patient remains in atrial flutter today with a rapid ventricular response. Continue metoprolol and Cardizem. Continue  Coumadin. Note she was in atrial fibrillation on December 9 and I therefore think that atrial flutter ablation would not be indicated. INR has been therapeutic; will proceed with DCCV this AM. Will then add flecanide 50 BID to maintain sinus (no h/o CAD; will need ETT as outpatient to exclude exercise induced VT). Note echocardiogram recently showed normal LV function. She will need lifelong Coumadin given recent TIA.  2 HYPERTENSION, BENIGN - Patient's blood pressure is improved; continue metoprolol and cardizem; continue off lisinopril HCT.  3 TOBACCO ABUSE - Patient counseled on discontinuing.  4 ATRIAL FIBRILLATION - Patient does have a history of atrial fibrillation. She is in atrial flutter now. See plan under atrial flutter. Await TSH.  5 Diabetes mellitus - Continue present medications and follow CBGs.  6 HYPERLIPIDEMIA - Continue statin.  7 COPD - Continue bronchodilators.  8 Microcytic anemia - patient to fu with primary care following DC. DC later today if patient in sinus rhythm on flecainide 50 mg by mouth twice a day. Arrange exercise treadmill to exclude exercise induced ventricular tachycardia in one week. Followup with me in 2-4 weeks. We'll change Cardizem to CD at time of discharge. > 30 min PA and physician time D2 Kirk Ruths

## 2014-01-06 NOTE — Anesthesia Preprocedure Evaluation (Addendum)
Anesthesia Evaluation  Patient identified by MRN, date of birth, ID band  Reviewed: Allergy & Precautions, H&P , NPO status , Patient's Chart, lab work & pertinent test results  Airway Mallampati: II      Dental  (+) Edentulous Upper and Edentulous Lower   Pulmonary COPD oxygen dependent, Current Smoker,          Cardiovascular hypertension, Pt. on medications and Pt. on home beta blockers + dysrhythmias (INR 3.78) Atrial Fibrillation  12/14 ECHO: EF 19-37%, grade 2 diastolic dysfunction, valves OK   Neuro/Psych TIA   GI/Hepatic Neg liver ROS, hiatal hernia, GERD-  Medicated and Controlled,  Endo/Other  diabetes (glu 127), Oral Hypoglycemic AgentsHypothyroidism   Renal/GU Renal InsufficiencyRenal disease (creat 1.20)     Musculoskeletal   Abdominal   Peds  Hematology  (+) Blood dyscrasia (Hb 10.6), anemia ,   Anesthesia Other Findings   Reproductive/Obstetrics                         Anesthesia Physical Anesthesia Plan  ASA: III  Anesthesia Plan: General   Post-op Pain Management:    Induction: Intravenous  Airway Management Planned: Mask  Additional Equipment:   Intra-op Plan:   Post-operative Plan:   Informed Consent: I have reviewed the patients History and Physical, chart, labs and discussed the procedure including the risks, benefits and alternatives for the proposed anesthesia with the patient or authorized representative who has indicated his/her understanding and acceptance.   Dental advisory given  Plan Discussed with:   Anesthesia Plan Comments:         Anesthesia Quick Evaluation

## 2014-01-10 ENCOUNTER — Encounter (HOSPITAL_COMMUNITY): Payer: Self-pay | Admitting: Cardiology

## 2014-01-13 ENCOUNTER — Encounter: Payer: Medicaid Other | Admitting: Physician Assistant

## 2014-01-17 ENCOUNTER — Encounter: Payer: Self-pay | Admitting: *Deleted

## 2014-01-17 ENCOUNTER — Ambulatory Visit: Payer: Medicaid Other | Admitting: Pharmacist

## 2014-01-17 ENCOUNTER — Telehealth: Payer: Self-pay | Admitting: Cardiology

## 2014-01-17 ENCOUNTER — Ambulatory Visit: Payer: Medicaid Other | Admitting: *Deleted

## 2014-01-17 ENCOUNTER — Ambulatory Visit (INDEPENDENT_AMBULATORY_CARE_PROVIDER_SITE_OTHER): Payer: Medicaid Other | Admitting: Nurse Practitioner

## 2014-01-17 ENCOUNTER — Encounter: Payer: Self-pay | Admitting: Nurse Practitioner

## 2014-01-17 VITALS — BP 100/68 | HR 140 | Ht 65.0 in | Wt 177.0 lb

## 2014-01-17 DIAGNOSIS — F17200 Nicotine dependence, unspecified, uncomplicated: Secondary | ICD-10-CM

## 2014-01-17 DIAGNOSIS — Z7901 Long term (current) use of anticoagulants: Secondary | ICD-10-CM

## 2014-01-17 DIAGNOSIS — I4891 Unspecified atrial fibrillation: Secondary | ICD-10-CM

## 2014-01-17 DIAGNOSIS — Z720 Tobacco use: Secondary | ICD-10-CM

## 2014-01-17 LAB — CBC WITH DIFFERENTIAL/PLATELET
BASOS PCT: 0.4 % (ref 0.0–3.0)
Basophils Absolute: 0 10*3/uL (ref 0.0–0.1)
EOS PCT: 1 % (ref 0.0–5.0)
Eosinophils Absolute: 0.1 10*3/uL (ref 0.0–0.7)
HCT: 36.6 % (ref 36.0–46.0)
Hemoglobin: 10.9 g/dL — ABNORMAL LOW (ref 12.0–15.0)
LYMPHS PCT: 19.4 % (ref 12.0–46.0)
Lymphs Abs: 2.1 10*3/uL (ref 0.7–4.0)
MCHC: 29.7 g/dL — ABNORMAL LOW (ref 30.0–36.0)
MCV: 73.4 fl — AB (ref 78.0–100.0)
Monocytes Absolute: 0.8 10*3/uL (ref 0.1–1.0)
Monocytes Relative: 7.2 % (ref 3.0–12.0)
Neutro Abs: 8 10*3/uL — ABNORMAL HIGH (ref 1.4–7.7)
Neutrophils Relative %: 72 % (ref 43.0–77.0)
Platelets: 424 10*3/uL — ABNORMAL HIGH (ref 150.0–400.0)
RBC: 4.98 Mil/uL (ref 3.87–5.11)
RDW: 23 % — ABNORMAL HIGH (ref 11.5–14.6)
WBC: 11.1 10*3/uL — AB (ref 4.5–10.5)

## 2014-01-17 LAB — BASIC METABOLIC PANEL
BUN: 10 mg/dL (ref 6–23)
CO2: 30 mEq/L (ref 19–32)
Calcium: 9.2 mg/dL (ref 8.4–10.5)
Chloride: 96 mEq/L (ref 96–112)
Creatinine, Ser: 1 mg/dL (ref 0.4–1.2)
GFR: 56.86 mL/min — ABNORMAL LOW (ref >60.00)
GLUCOSE: 102 mg/dL — AB (ref 70–99)
POTASSIUM: 3.7 meq/L (ref 3.5–5.1)
Sodium: 140 mEq/L (ref 135–145)

## 2014-01-17 LAB — PROTIME-INR
INR: 3.2 ratio — ABNORMAL HIGH (ref 0.8–1.0)
PROTHROMBIN TIME: 33.4 s — AB (ref 10.2–12.4)

## 2014-01-17 MED ORDER — FLECAINIDE ACETATE 100 MG PO TABS: 100.0000 mg | ORAL_TABLET | Freq: Two times a day (BID) | ORAL | Status: DC

## 2014-01-17 NOTE — Telephone Encounter (Addendum)
Spoke with pt, she had not taken her cardiac meds this am prior to her appt. Her pulse at this time is 93. Her flecainide was increased at the office visit, will confirm with dr Stanford Breed if needs to keep that dose or change.

## 2014-01-17 NOTE — Progress Notes (Signed)
Patient Name: Colleen [name] Date of Encounter: 01/17/2014  Primary Care Provider:  [name], NP Primary Cardiologist:  B. Crenshaw, MD   Patient Profile  63 year old female with recent hospitalization secondary to paroxysmal atrial fibrillation who presents back to clinic today in rapid A. Fib.  Problem List   Past Medical History  Diagnosis Date  . Hypertension   . Hiatal hernia   . Adenomatous polyp of colon   . Acute asthmatic bronchitis   . GERD (gastroesophageal reflux disease)     EGD neg 03/07/2008, but prev required dilation  . COPD (chronic obstructive pulmonary disease)   . IBS (irritable bowel syndrome)     chronic  . Urinary incontinence   . Esophagitis   . Chronic gastritis   . Duodenitis without hemorrhage 10/01/2001  . Diverticulosis   . Personal history of colonic adenomas 02/05/2008        . Anemia   . Hyperlipidemia   . Decreased peripheral vision of right eye   . Pneumonia 2011  . Hyperthyroidism   . Type II diabetes mellitus   . Stroke ~ 2009; 12/2013 X 3    a. Hx of stroke and ministrokes. b. TIA 12/2013, dx with AF at that time.  . Arthritis   . Depression   . On home oxygen therapy     "2L; suppose to use 24/7" (01/05/2014)  . PAF (paroxysmal atrial fibrillation)     a. Prior hx. b. Recurred 11/2013 in setting of possible TIA. Lifelong Coumadin planned. Spont converted to NSR. EF 60-65% by echo 11/2013, normal cors 2007, normal nuc 2011.   . Atrial flutter     a. Dx 12/2013. s/p DCCV. On anticoagulation.   Past Surgical History  Procedure Laterality Date  . Colonoscopy    . Upper gi endoscopy    . Cholecystectomy  1980's  . Vaginal hysterectomy  1992  . Breast biopsy Left 1987    "milk gland"  . Cardioversion N/A 01/06/2014    Procedure: CARDIOVERSION;  Surgeon: Lelon Perla, MD;  Location: [facility] OR;  Service: Cardiovascular;  Laterality: N/A;    Allergies  Allergies  Allergen Reactions  . Latex Hives    HPI  63 year old female  who was recently hospitalized at [facility] after being seen in clinic and found to be in rapid coarse afib.  During hospitalization, outside INR records were obtained and after it was confirmed that she had been therapeutic for several weeks, she underwent successful cardioversion.  She was discharged home on flecainide 50 mg b.i.d.  She says that she has not noticed any significant change in her activity tolerance since discharge.  She has some degree of chronic dyspnea exertion when she tripped to COPD and says that she also frequently gets sleepy and tired in the early afternoon, which is not anything new for her.  She has not been expressing palpitations or chest pain.  She denies PND, orthopnea, dizziness, syncope, edema, or early satiety.  She continues to smoke cigarettes.  In clinic today, she is back in rapid atrial fibrillation at a rate of 140 beats per minute.  She does not feel palpitations at this time.  She has been compliant with her medications and reports that she had an INR checked in about a week ago and it was therapeutic.  Home Medications  Prior to Admission medications   Medication Sig Start Date End Date Taking? Authorizing Provider  albuterol (PROVENTIL) (2.5 MG/3ML) 0.083% nebulizer solution Take 2.5 mg  by nebulization 2 (two) times daily.    Yes Historical Provider, MD  ALPRAZolam Duanne Moron) 0.5 MG tablet Take 0.5 mg by mouth at bedtime.    Yes Historical Provider, MD  atorvastatin (LIPITOR) 20 MG tablet Take 1 tablet (20 mg total) by mouth every evening. 01/06/14  Yes Dayna N Dunn, PA-C  cetirizine (ZYRTEC) 10 MG tablet Take 10 mg by mouth at bedtime.    Yes Historical Provider, MD  diltiazem (CARDIZEM CD) 240 MG 24 hr capsule Take 1 capsule (240 mg total) by mouth daily. 01/06/14  Yes Dayna N Dunn, PA-C  DULoxetine (CYMBALTA) 60 MG capsule Take 120 mg by mouth daily.   Yes Historical Provider, MD  ferrous sulfate 325 (65 FE) MG tablet Take 325 mg by mouth 2 (two) times daily with  a meal.   Yes Historical Provider, MD  flecainide (TAMBOCOR) 100 MG tablet Take 1 tablet (100 mg total) by mouth every 12 (twelve) hours. 01/17/14  Yes Rogelia Mire, NP  Fluticasone-Salmeterol (ADVAIR DISKUS) 250-50 MCG/DOSE AEPB Inhale 1 puff into the lungs 2 (two) times daily.    Yes Historical Provider, MD  furosemide (LASIX) 40 MG tablet Take 40 mg by mouth daily.    Yes Historical Provider, MD  HYDROcodone-acetaminophen (NORCO) 7.5-325 MG per tablet Take 1 tablet by mouth every 6 (six) hours as needed for moderate pain.   Yes Historical Provider, MD  levalbuterol [facility] HFA) 45 MCG/ACT inhaler Inhale 2 puffs into the lungs every 4 (four) hours as needed for wheezing or shortness of breath.   Yes Historical Provider, MD  metFORMIN (GLUMETZA) 1000 MG (MOD) 24 hr tablet Take 1,000 mg by mouth 2 (two) times daily with a meal.   Yes Historical Provider, MD  methimazole (TAPAZOLE) 5 MG tablet Take 5 mg by mouth daily.   Yes Historical Provider, MD  metoprolol succinate (TOPROL-XL) 25 MG 24 hr tablet Take 25 mg by mouth every morning.    Yes Historical Provider, MD  omeprazole (PRILOSEC) 40 MG capsule Take 40 mg by mouth 2 (two) times daily.    Yes Historical Provider, MD  solifenacin (VESICARE) 10 MG tablet Take 10 mg by mouth daily.   Yes Historical Provider, MD  tiotropium (SPIRIVA) 18 MCG inhalation capsule Place 18 mcg into inhaler and inhale daily.    Yes Historical Provider, MD  trimethoprim (TRIMPEX) 100 MG tablet Take 100 mg by mouth at bedtime.   Yes Historical Provider, MD  warfarin (COUMADIN) 3 MG tablet Take 1 tablet (3 mg total) by mouth See admin instructions. No Coumadin today (01/06/14). Tomorrow, start 3mg  every day except Wednesdays. On Wednesdays, take 4mg . 01/06/14  Yes Dayna N Dunn, PA-C  warfarin (COUMADIN) 4 MG tablet Take 1 tablet (4 mg total) by mouth See admin instructions. No Coumadin today (01/06/14). Tomorrow, start 3mg  every day except Wednesdays. On Wednesdays, take 4mg .  01/06/14  Yes Dayna N Dunn, PA-C    Family History  Family History  Problem Relation Age of Onset  . Heart disease Mother   . Asthma Father   . Lung cancer Father   . Asthma Sister   . Heart disease Sister   . Colon cancer Father     Social History  History   Social History  . Marital Status: Legally Separated    Spouse Name: N/A    Number of Children: N/A  . Years of Education: N/A   Occupational History  . Not on file.   Social History Main Topics  .  Smoking status: Current Every Day Smoker -- 1.00 packs/day for 51 years    Types: Cigarettes  . Smokeless tobacco: Never Used  . Alcohol Use: No  . Drug Use: No  . Sexual Activity: No   Other Topics Concern  . Not on file   Social History Narrative  . No narrative on file     Review of Systems General:  No chills, fever, night sweats or weight changes. She often gets sleepy in the middle of the day - not new. Cardiovascular:  No chest pain, +++ chronic dyspnea on exertion, edema, orthopnea, palpitations, paroxysmal nocturnal dyspnea. Dermatological: No rash, lesions/masses Respiratory: No cough, dyspnea Urologic: No hematuria, dysuria Abdominal:   No nausea, vomiting, diarrhea, bright red blood per rectum, melena, or hematemesis Neurologic:  No visual changes, wkns, changes in mental status. All other systems reviewed and are otherwise negative except as noted above.  Physical Exam  Blood pressure 100/68, pulse 140, height 5\' 5"  (1.651 m), weight 177 lb (80.287 kg).  General: Pleasant, NAD Psych: Normal affect. Neuro: Alert and oriented X 3. Moves all extremities spontaneously. HEENT: Normal  Neck: Supple without bruits or JVD. Lungs:  Resp regular and unlabored, CTA. Heart: IR, IR, tachy -  no s3, s4, or murmurs. Abdomen: Soft, non-tender, non-distended, BS + x 4.  Extremities: No clubbing, cyanosis or edema. DP/PT/Radials 2+ and equal bilaterally.  Accessory Clinical Findings  ECG - course atrophic  relation, 140 beats per minute, poor R-wave progression with inferolateral upsloping ST segment depression similar to prior ECG when in rapid A. Fib.  Assessment & Plan  1.  Atrial fibrillation with rapid ventricular response: Patient is status post recent cardioversion and was placed on flecainide therapy at discharge.  She's been compliant with her flecainide, Coumadin, calcium channel blocker, and beta blocker therapy.  Unfortunately, she is back in A. Fib today.  I have reviewed her case with Dr. Stanford Breed and as she is currently asymptomatic, she does not require hospital admission.  We are unable to titrate beta blocker or calcium channel blocker therapy secondary to soft blood pressures.  We will increase flecainide to 100 mg b.i.d. And have arranged for repeat cardioversion on Wednesday the 21st.  We will obtain CBC, basic metabolic profile, and INR today.  If she is able to maintain sinus rhythm following dccv, she will require ETT in the near future to ensure that she is tolerating flecainide.  If however, she is not able to maintain sinus rhythm, we will have to consider discontinuation of flecainide in favor of another antiarrhythmic.  2.  Tob Abuse:  Cessation advised.  She says that she has cut back and will continue to try and quit.  3.  HTN:  BP's currently low in setting of rapid afib.  4. Chronic anticoagulation:  Check INR today. She says that it was checked by her PCP last week and was Rx.  5.  Dispo:  Titrate flecainide as above and plan on dccv on 1/21.  Murray Hodgkins, NP 01/17/2014, 11:18 AM

## 2014-01-17 NOTE — Telephone Encounter (Signed)
New message  Patient stated she did not take her medication. Please advise.   Was seen in the office today by NPA.

## 2014-01-17 NOTE — Patient Instructions (Addendum)
YOU WILL BE HAVING A CARDIOVERSION January 19, 2014 AT 1:00 PM WITH DR. CRENSHAW. ARRIVE AT 11:00 AM AT [place]  Your physician recommends that you return for lab work in: PT-INR, CBC, BMET TODAY  Your physician has recommended you make the following change in your medication:   INCREASE YOUR FLECANIDE [address] [place]

## 2014-01-18 NOTE — Telephone Encounter (Signed)
Spoke with pt, aware per dr Stanford Breed she will still need to increase flecainide to 100 mg twice daily. Patient voiced understanding

## 2014-01-19 ENCOUNTER — Ambulatory Visit (HOSPITAL_COMMUNITY): Admission: RE | Admit: 2014-01-19 | Payer: Medicaid Other | Source: Ambulatory Visit | Admitting: Cardiology

## 2014-01-19 ENCOUNTER — Encounter (HOSPITAL_COMMUNITY): Admission: RE | Payer: Self-pay | Source: Ambulatory Visit

## 2014-01-19 SURGERY — CARDIOVERSION
Anesthesia: Monitor Anesthesia Care

## 2014-01-19 NOTE — Progress Notes (Signed)
Per CRNA, she thought case was cancelled. Spoke with patient to make sure she had cancelled case. Per patient "she called Dr. Jacalyn Lefevre office and did cancel because she was to sacred to have procedure today". Dr. Stanford Breed and Anesthesia were notified.

## 2014-01-25 ENCOUNTER — Ambulatory Visit (INDEPENDENT_AMBULATORY_CARE_PROVIDER_SITE_OTHER): Payer: Medicaid Other | Admitting: Nurse Practitioner

## 2014-01-25 ENCOUNTER — Other Ambulatory Visit: Payer: Self-pay | Admitting: Nurse Practitioner

## 2014-01-25 ENCOUNTER — Encounter: Payer: Self-pay | Admitting: *Deleted

## 2014-01-25 ENCOUNTER — Encounter: Payer: Self-pay | Admitting: Nurse Practitioner

## 2014-01-25 VITALS — BP 100/78 | HR 111 | Ht 65.0 in | Wt 179.0 lb

## 2014-01-25 DIAGNOSIS — I4891 Unspecified atrial fibrillation: Secondary | ICD-10-CM

## 2014-01-25 DIAGNOSIS — F17200 Nicotine dependence, unspecified, uncomplicated: Secondary | ICD-10-CM

## 2014-01-25 DIAGNOSIS — Z720 Tobacco use: Secondary | ICD-10-CM

## 2014-01-25 LAB — PROTIME-INR
INR: 3.6 ratio — ABNORMAL HIGH (ref 0.8–1.0)
Prothrombin Time: 37.5 s — ABNORMAL HIGH (ref 10.2–12.4)

## 2014-01-25 MED ORDER — SODIUM CHLORIDE 0.9 % IV SOLN
INTRAVENOUS | Status: DC
Start: 2014-01-25 — End: 2014-01-26

## 2014-01-25 NOTE — Progress Notes (Signed)
Patient Name: Colleen [name] Date of Encounter: 01/25/2014  Primary Care Provider:  [name], NP Primary Cardiologist:  B. Stanford Breed, MD   Patient Profile  63 year old female who is scheduled to have a cardioversion last week but canceled and presents for followup.  Problem List   Past Medical History  Diagnosis Date  . Hypertension   . Hiatal hernia   . Adenomatous polyp of colon   . Acute asthmatic bronchitis   . GERD (gastroesophageal reflux disease)     EGD neg 03/07/2008, but prev required dilation  . COPD (chronic obstructive pulmonary disease)   . IBS (irritable bowel syndrome)     chronic  . Urinary incontinence   . Esophagitis   . Chronic gastritis   . Duodenitis without hemorrhage 10/01/2001  . Diverticulosis   . Personal history of colonic adenomas 02/05/2008        . Anemia   . Hyperlipidemia   . Decreased peripheral vision of right eye   . Pneumonia 2011  . Hyperthyroidism   . Type II diabetes mellitus   . Stroke ~ 2009; 12/2013 X 3    a. Hx of stroke and ministrokes. b. TIA 12/2013, dx with AF at that time.  . Arthritis   . Depression   . On home oxygen therapy     "2L; suppose to use 24/7" (01/05/2014)  . PAF (paroxysmal atrial fibrillation)     a. Prior hx. b. Recurred 11/2013 in setting of possible TIA. Lifelong Coumadin planned. Spont converted to NSR. EF 60-65% by echo 11/2013, normal cors 2007, normal nuc 2011.   . Atrial flutter     a. Dx 12/2013. s/p DCCV. On anticoagulation.   Past Surgical History  Procedure Laterality Date  . Colonoscopy    . Upper gi endoscopy    . Cholecystectomy  1980's  . Vaginal hysterectomy  1992  . Breast biopsy Left 1987    "milk gland"  . Cardioversion N/A 01/06/2014    Procedure: CARDIOVERSION;  Surgeon: Lelon Perla, MD;  Location: [facility];  Service: Cardiovascular;  Laterality: N/A;    Allergies  Allergies  Allergen Reactions  . Latex Hives    HPI  63 year old female with the above problem list.  I  just saw in clinic last week at which time she was back in A. Fib/flutter.  After discussion with Dr. Stanford Breed, we decided to increase her flecainide to 100 b.i.d. And arranged for a cardioversion to take place last week.  Unfortunately, she canceled this.  Today, she presented for an exercise treadmill which was scheduled due to ongoing flecainide therapy however this was canceled in the setting of A. Fib with RVR with rates in the 130s.  Her point was then changed to clinic visit so that we could rearrange her cardioversion.  Over the past week, she's continued to have fatigue and mild dyspnea exertion.  She denies chest pain, palpitations, or presyncope.she says her heart rates at home are fairly variable.  She cancel cardioversion because she was scared however at this point is willing to undergo cardioversion.  Home Medications  Prior to Admission medications   Medication Sig Start Date End Date Taking? Authorizing Provider  albuterol (PROVENTIL) (2.5 MG/3ML) 0.083% nebulizer solution Take 2.5 mg by nebulization 2 (two) times daily.    Yes Historical Provider, MD  ALPRAZolam Duanne Moron) 0.5 MG tablet Take 0.5 mg by mouth at bedtime.    Yes Historical Provider, MD  atorvastatin (LIPITOR) 20 MG tablet Take 1  tablet (20 mg total) by mouth every evening. 01/06/14  Yes Dayna N Dunn, PA-C  cetirizine (ZYRTEC) 10 MG tablet Take 10 mg by mouth at bedtime.    Yes Historical Provider, MD  diltiazem (CARDIZEM CD) 240 MG 24 hr capsule Take 1 capsule (240 mg total) by mouth daily. 01/06/14  Yes Dayna N Dunn, PA-C  DULoxetine (CYMBALTA) 60 MG capsule Take 120 mg by mouth daily.   Yes Historical Provider, MD  ferrous sulfate 325 (65 FE) MG tablet Take 325 mg by mouth 2 (two) times daily with a meal.   Yes Historical Provider, MD  flecainide (TAMBOCOR) 100 MG tablet Take 1 tablet (100 mg total) by mouth every 12 (twelve) hours. 01/17/14  Yes Rogelia Mire, NP  Fluticasone-Salmeterol (ADVAIR DISKUS) 250-50 MCG/DOSE  AEPB Inhale 1 puff into the lungs 2 (two) times daily.    Yes Historical Provider, MD  furosemide (LASIX) 40 MG tablet Take 40 mg by mouth daily.    Yes Historical Provider, MD  HYDROcodone-acetaminophen (NORCO) 7.5-325 MG per tablet Take 1 tablet by mouth every 6 (six) hours as needed for moderate pain.   Yes Historical Provider, MD  levalbuterol [facility] HFA) 45 MCG/ACT inhaler Inhale 2 puffs into the lungs every 4 (four) hours as needed for wheezing or shortness of breath.   Yes Historical Provider, MD  metFORMIN (GLUMETZA) 1000 MG (MOD) 24 hr tablet Take 1,000 mg by mouth 2 (two) times daily with a meal.   Yes Historical Provider, MD  methimazole (TAPAZOLE) 5 MG tablet Take 5 mg by mouth daily.   Yes Historical Provider, MD  metoprolol succinate (TOPROL-XL) 25 MG 24 hr tablet Take 25 mg by mouth every morning.    Yes Historical Provider, MD  omeprazole (PRILOSEC) 40 MG capsule Take 40 mg by mouth 2 (two) times daily.    Yes Historical Provider, MD  solifenacin (VESICARE) 10 MG tablet Take 10 mg by mouth daily.   Yes Historical Provider, MD  tiotropium (SPIRIVA) 18 MCG inhalation capsule Place 18 mcg into inhaler and inhale daily.    Yes Historical Provider, MD  trimethoprim (TRIMPEX) 100 MG tablet Take 100 mg by mouth at bedtime.   Yes Historical Provider, MD  warfarin (COUMADIN) 3 MG tablet Take 1 tablet (3 mg total) by mouth See admin instructions. No Coumadin today (01/06/14). Tomorrow, start 3mg  every day except Wednesdays. On Wednesdays, take 4mg . 01/06/14  Yes Dayna N Dunn, PA-C  warfarin (COUMADIN) 4 MG tablet Take 1 tablet (4 mg total) by mouth See admin instructions. No Coumadin today (01/06/14). Tomorrow, start 3mg  every day except Wednesdays. On Wednesdays, take 4mg . 01/06/14  Yes Dayna N Dunn, PA-C    Family History  Family History  Problem Relation Age of Onset  . Heart disease Mother   . Asthma Father   . Lung cancer Father   . Asthma Sister   . Heart disease Sister   . Colon cancer  Father     Social History  History   Social History  . Marital Status: Legally Separated    Spouse Name: N/A    Number of Children: N/A  . Years of Education: N/A   Occupational History  . Not on file.   Social History Main Topics  . Smoking status: Current Every Day Smoker -- 1.00 packs/day for 51 years    Types: Cigarettes  . Smokeless tobacco: Never Used  . Alcohol Use: No  . Drug Use: No  . Sexual Activity: No  Other Topics Concern  . Not on file   Social History Narrative  . No narrative on file     Review of Systems General: her only complaint is fatigue. No chills, fever, night sweats or weight changes.  Cardiovascular:  No chest pain, dyspnea on exertion, edema, orthopnea, palpitations, paroxysmal nocturnal dyspnea. Dermatological: No rash, lesions/masses Respiratory: No cough, mild, chronic dyspnea on exertion. Urologic: No hematuria, dysuria Abdominal:   No nausea, vomiting, diarrhea, bright red blood per rectum, melena, or hematemesis Neurologic:  No visual changes, wkns, changes in mental status. All other systems reviewed and are otherwise negative except as noted above.  Physical Exam  Blood pressure 100/78, pulse 111, height 5\' 5"  (1.651 m), weight 179 lb (81.194 kg).  General: Pleasant, NAD Psych: Normal affect. Neuro: Alert and oriented X 3. Moves all extremities spontaneously. HEENT: Normal  Neck: Supple without bruits or JVD. Lungs:  Resp regular and unlabored, diminished breath sounds bilaterally. Heart: irregularly, irregular, tachycardic, no s3, s4, or murmurs. Abdomen: Soft, non-tender, non-distended, BS + x 4.  Extremities: No clubbing, cyanosis or edema. DP/PT/Radials 2+ and equal bilaterally.  Accessory Clinical Findings  ECG - afib, 136, poor r prog, no acute st/t changes.  Assessment & Plan  1.  Atrial fibrillation with rapid ventricular response: As above, patient's flecainide dose was increased to 100 mg b.i.d. Last week.  She  remains on diltiazem.  She was supposed to undergo cardioversion last week but canceled at the last minute.  She is now willing to undergo cardioversion.  Looking back, her INRs have been therapeutic.  Will repeat INR today and schedule [facility] cardioversion [facility] at [facility].  Following cardioversion, she will require exercise treadmill testing when she is in sinus rhythm.  We will arrange this for the next week or 2.  If she is unable to maintain sinus rhythm on flecainide, we would plan to discontinue flecainide and we'll need to consider an alternate antiarrhythmic.  2.  Tobacco abuse: She continues to smoke.  Cessation counseling provided.  She's not currently motivated to quit.  3.  Disposition: Cardioversion tomorrow provided the INR is therapeutic.  Followup exercise treadmill testing because of ongoing flecainide therapy within the next one to 2 weeks.  Follow up with Dr. Stanford Breed in the next 4-6 weeks.   Murray Hodgkins, NP 01/25/2014, 10:42 AM

## 2014-01-25 NOTE — Patient Instructions (Addendum)
Your physician recommends that you return for lab work in: today pt/inr  Your physician has requested that you have an exercise tolerance test. 1 week after cardioversion with anyone weaver/ berge / gerhardt/ crenshaw.  Please also follow instruction sheet, as given.  Your physician has recommended that you have a Cardioversion (DCCV). Electrical Cardioversion uses a jolt of electricity to your heart either through paddles or wired patches attached to your chest. This is a controlled, usually prescheduled, procedure. Defibrillation is done under light anesthesia in the hospital, and you usually go home the day of the procedure. This is done to get your heart back into a normal rhythm. You are not awake for the procedure. Please see the instruction sheet given to you today.

## 2014-01-25 NOTE — Progress Notes (Deleted)
Exercise Treadmill Test  Pre-Exercise Testing Evaluation Rhythm: {CHL RHYTHM BASELINE EKG FOR ULA:45364680}  Rate: {CHL RATE BASELINE EKG FOR HOZ:22482500}     Test  Exercise Tolerance Test Ordering MD: Kirk Ruths, MD  Interpreting MD: Murray Hodgkins, NP  Unique Test No: 1  Treadmill:  1  Indication for ETT: aflutter/flecanide  Contraindication to ETT: No   Stress Modality: exercise - treadmill  Cardiac Imaging Performed: non   Protocol: standard Bruce - maximal  Max BP:  ***/***  Max MPHR (bpm):  *** 85% MPR (bpm):  ***  MPHR obtained (bpm):  *** % MPHR obtained:  ***  Reached 85% MPHR (min:sec):  *** Total Exercise Time (min-sec):  ***  Workload in METS:  *** Borg Scale: ***  Reason ETT Terminated:  {CHL REASON TERMINATED FOR BBC:48889169}    ST Segment Analysis At Rest: {CHL ST SEGMENT AT REST FOR IHW:38882800} With Exercise: {CHL ST SEGMENT WITH EXERCISE FOR LKJ:17915056}  Other Information Arrhythmia:  {CHL ARRHYTHMIA FOR PVX:48016553} Angina during ETT:  {CHL ANGINA DURING ZSM:27078675} Quality of ETT:  {CHL QUALITY OF QGB:20100712}  ETT Interpretation:  {CHL INTERPRETATION FOR RFX:58832549}  Comments: ***  Recommendations: ***

## 2014-01-26 ENCOUNTER — Other Ambulatory Visit: Payer: Self-pay | Admitting: Cardiology

## 2014-01-26 ENCOUNTER — Telehealth: Payer: Self-pay | Admitting: Cardiology

## 2014-01-26 ENCOUNTER — Ambulatory Visit (HOSPITAL_COMMUNITY): Admission: RE | Admit: 2014-01-26 | Payer: Medicaid Other | Source: Ambulatory Visit | Admitting: Cardiology

## 2014-01-26 ENCOUNTER — Encounter (HOSPITAL_COMMUNITY): Payer: Self-pay | Admitting: Anesthesiology

## 2014-01-26 ENCOUNTER — Encounter (HOSPITAL_COMMUNITY): Admission: RE | Payer: Self-pay | Source: Ambulatory Visit

## 2014-01-26 DIAGNOSIS — I4891 Unspecified atrial fibrillation: Secondary | ICD-10-CM

## 2014-01-26 SURGERY — CARDIOVERSION
Anesthesia: Monitor Anesthesia Care

## 2014-01-26 NOTE — Telephone Encounter (Signed)
New Problem:  Pt is calling stating she is suppose to be at the hospital to get her heart shocked. Pt is wanting to speak to the nurse for clarification. I informed the pt her next appt that I see is at [facility] for a ETT on 02/02/14.

## 2014-01-26 NOTE — Telephone Encounter (Signed)
Pt instructed to not take diltiazem, Patient voiced understanding

## 2014-01-26 NOTE — Anesthesia Preprocedure Evaluation (Deleted)
Anesthesia Evaluation  Patient identified by MRN, date of birth, ID band Patient awake    Reviewed: Allergy & Precautions, H&P , NPO status , Patient's Chart, lab work & pertinent test results  Airway Mallampati: II TM Distance: >3 FB Neck ROM: Full    Dental   Pulmonary shortness of breath, asthma , COPDCurrent Smoker,  + rhonchi         Cardiovascular hypertension, + dysrhythmias Atrial Fibrillation Rhythm:Irregular Rate:Normal     Neuro/Psych Depression TIA Neuromuscular disease CVA    GI/Hepatic hiatal hernia, GERD-  Controlled and Medicated,  Endo/Other  diabetes, Oral Hypoglycemic Agents  Renal/GU      Musculoskeletal   Abdominal   Peds  Hematology   Anesthesia Other Findings   Reproductive/Obstetrics                           Anesthesia Physical Anesthesia Plan  ASA: IV  Anesthesia Plan: General   Post-op Pain Management:    Induction: Intravenous  Airway Management Planned: Mask  Additional Equipment:   Intra-op Plan:   Post-operative Plan:   Informed Consent: I have reviewed the patients History and Physical, chart, labs and discussed the procedure including the risks, benefits and alternatives for the proposed anesthesia with the patient or authorized representative who has indicated his/her understanding and acceptance.     Plan Discussed with: CRNA and Surgeon  Anesthesia Plan Comments:         Anesthesia Quick Evaluation

## 2014-01-26 NOTE — Telephone Encounter (Signed)
Spoke with pt, according to the pt she came to our office and the hosp today for DCCV and no one knew what she was talking about. When she got home the hosp called her about missing her procedure. She is rescheduled to have her DCCV 01-27-14 @ 9 am. Instructions dicussed in detail with the pt.

## 2014-01-27 ENCOUNTER — Ambulatory Visit (HOSPITAL_COMMUNITY): Admission: RE | Admit: 2014-01-27 | Payer: Medicaid Other | Source: Ambulatory Visit | Admitting: Cardiology

## 2014-01-27 ENCOUNTER — Encounter (HOSPITAL_COMMUNITY): Admission: RE | Payer: Self-pay | Source: Ambulatory Visit

## 2014-01-27 ENCOUNTER — Encounter (HOSPITAL_COMMUNITY): Payer: Self-pay | Admitting: Certified Registered Nurse Anesthetist

## 2014-01-27 SURGERY — CARDIOVERSION
Anesthesia: Monitor Anesthesia Care

## 2014-01-27 MED ORDER — ROCURONIUM BROMIDE 50 MG/5ML IV SOLN
INTRAVENOUS | Status: AC
  Filled 2014-01-27: qty 1

## 2014-01-27 MED ORDER — PHENYLEPHRINE 40 MCG/ML (10ML) SYRINGE FOR IV PUSH (FOR BLOOD PRESSURE SUPPORT)
PREFILLED_SYRINGE | INTRAVENOUS | Status: AC
  Filled 2014-01-27: qty 10

## 2014-01-27 MED ORDER — EPHEDRINE SULFATE 50 MG/ML IJ SOLN
INTRAMUSCULAR | Status: AC
  Filled 2014-01-27: qty 1

## 2014-01-27 MED ORDER — PROPOFOL 10 MG/ML IV BOLUS
INTRAVENOUS | Status: AC
  Filled 2014-01-27: qty 20

## 2014-01-27 MED ORDER — SUCCINYLCHOLINE CHLORIDE 20 MG/ML IJ SOLN
INTRAMUSCULAR | Status: AC
Start: 2014-01-27 — End: 2014-01-27
  Filled 2014-01-27: qty 1

## 2014-01-27 MED ORDER — STERILE WATER FOR INJECTION IJ SOLN
INTRAMUSCULAR | Status: AC
  Filled 2014-01-27: qty 10

## 2014-02-01 ENCOUNTER — Telehealth: Payer: Self-pay | Admitting: Cardiology

## 2014-02-01 NOTE — Telephone Encounter (Signed)
New Message  Pt requests a call back to resch cardioversion//SR

## 2014-02-01 NOTE — Telephone Encounter (Signed)
Pt was told she will receive a call tomorrow to reschedule. Pt agreed to plan.

## 2014-02-02 ENCOUNTER — Encounter (HOSPITAL_COMMUNITY): Payer: Medicaid Other

## 2014-02-02 NOTE — Telephone Encounter (Signed)
Spoke with pt, she is wanting only dr Stanford Breed to do the dccv. Will call the hosp to arrange.

## 2014-02-04 ENCOUNTER — Other Ambulatory Visit: Payer: Self-pay | Admitting: Cardiology

## 2014-02-04 ENCOUNTER — Telehealth: Payer: Self-pay | Admitting: *Deleted

## 2014-02-04 DIAGNOSIS — I4891 Unspecified atrial fibrillation: Secondary | ICD-10-CM

## 2014-02-04 NOTE — Telephone Encounter (Signed)
Spoke with pt, due to her elevated heart rate with the atrial fib dr Stanford Breed wants the pt to go ahead and have the DCCV. Explained to pt that it will be dr Meda Coffee preforming the test. She is scheduled for 02-09-14 @ 11am. Instructions discussed over the phone. Patient voiced understanding

## 2014-02-04 NOTE — Telephone Encounter (Signed)
SEE PHONE NOTE FROM 02-04-14

## 2014-02-06 ENCOUNTER — Telehealth: Payer: Self-pay | Admitting: Cardiology

## 2014-02-06 DIAGNOSIS — I4891 Unspecified atrial fibrillation: Secondary | ICD-10-CM

## 2014-02-06 DIAGNOSIS — Z01812 Encounter for preprocedural laboratory examination: Secondary | ICD-10-CM

## 2014-02-06 NOTE — Telephone Encounter (Signed)
Pt called complaining of swelling in her hands, feet, and around her eyes. I could not determine what medications she takes or what medicines have recently been changed. She did not sound like she was having a drug reaction. I suggested she gather all her medications and go to the office Monday to try and sort this out.  Kerin Ransom PA-C 02/06/2014 2:40 PM

## 2014-02-07 NOTE — Telephone Encounter (Signed)
Patient states all she needs is to be put back on the benecar.  She took it for 3 years and had no swelling.  Benecar was discontinued by Dr. Stanford Breed at last hospitalization for low bps, and to make room for other agents to help control her rhythm/rate.  She does continue lasix 40mg  daily.  Asked pt about bp/hr today.  I will call her back in 30 min at her request, the cna that helps her is out getting her BP cuff.

## 2014-02-07 NOTE — Telephone Encounter (Signed)
Spoke with Ms Demary again.  Her BP 133/94 and HR 63.  Discussed with Dr. Jacalyn Lefevre primary RN, Fredia Beets.  Advised patient to take lasix 40 mg bid today and tomorrow.  Also advised to come to office at [place] on Wed morning to have blood work (BMET, PT/INR) prior to procedure at hospital.  Pt verbalizes understanding.  Also instructed pt to call back if edema worsens or anything new develops.

## 2014-02-08 ENCOUNTER — Telehealth: Payer: Self-pay | Admitting: Cardiology

## 2014-02-08 NOTE — Telephone Encounter (Signed)
New message   Was told to call back to discuss upcoming procedure.

## 2014-02-08 NOTE — Telephone Encounter (Signed)
Spoke with pt, she states she still has the swelling in her feet. She wanted to know if she still needed to do the DCCV tomorrow. Explained it is important for her to get the DCCV done to help her heart and the swelling. Pt voiced understanding.

## 2014-02-09 ENCOUNTER — Other Ambulatory Visit: Payer: Medicaid Other

## 2014-02-09 ENCOUNTER — Ambulatory Visit (HOSPITAL_COMMUNITY)
Admission: RE | Admit: 2014-02-09 | Discharge: 2014-02-09 | Disposition: A | Discharge status: Home / Self Care | Payer: Medicaid Other | Source: Ambulatory Visit | Attending: Cardiology | Admitting: Cardiology

## 2014-02-09 ENCOUNTER — Encounter (HOSPITAL_COMMUNITY)
Admission: RE | Disposition: A | Discharge status: Home / Self Care | Payer: Self-pay | Source: Ambulatory Visit | Attending: Cardiology

## 2014-02-09 ENCOUNTER — Encounter (HOSPITAL_COMMUNITY): Payer: Self-pay

## 2014-02-09 DIAGNOSIS — R55 Syncope and collapse: Secondary | ICD-10-CM | POA: Insufficient documentation

## 2014-02-09 DIAGNOSIS — I4891 Unspecified atrial fibrillation: Secondary | ICD-10-CM | POA: Insufficient documentation

## 2014-02-09 DIAGNOSIS — Z01812 Encounter for preprocedural laboratory examination: Secondary | ICD-10-CM

## 2014-02-09 DIAGNOSIS — Z5309 Procedure and treatment not carried out because of other contraindication: Secondary | ICD-10-CM | POA: Insufficient documentation

## 2014-02-09 DIAGNOSIS — E876 Hypokalemia: Secondary | ICD-10-CM | POA: Insufficient documentation

## 2014-02-09 LAB — BASIC METABOLIC PANEL
BUN: 11 mg/dL (ref 6–23)
CO2: 32 mEq/L (ref 19–32)
Calcium: 8.5 mg/dL (ref 8.4–10.5)
Chloride: 93 mEq/L — ABNORMAL LOW (ref 96–112)
Creat: 0.85 mg/dL (ref 0.50–1.10)
Glucose, Bld: 137 mg/dL — ABNORMAL HIGH (ref 70–99)
POTASSIUM: 2.8 meq/L — AB (ref 3.5–5.3)
Sodium: 141 mEq/L (ref 135–145)

## 2014-02-09 LAB — PROTIME-INR
INR: 2.34 — AB (ref <1.50)
Prothrombin Time: 24.9 seconds — ABNORMAL HIGH (ref 11.6–15.2)

## 2014-02-09 LAB — GLUCOSE, CAPILLARY: Glucose-Capillary: 113 mg/dL — ABNORMAL HIGH (ref 70–99)

## 2014-02-09 SURGERY — Surgical Case

## 2014-02-09 MED ORDER — POTASSIUM CHLORIDE CRYS ER 20 MEQ PO TBCR: 20.0000 meq | EXTENDED_RELEASE_TABLET | Freq: Every day | ORAL | Status: DC

## 2014-02-09 MED ORDER — POTASSIUM CHLORIDE CRYS ER 20 MEQ PO TBCR
40.0000 meq | EXTENDED_RELEASE_TABLET | Freq: Once | ORAL | Status: AC
  Administered 2014-02-09: 40 meq via ORAL
  Filled 2014-02-09: qty 2

## 2014-02-09 MED ORDER — METOPROLOL SUCCINATE ER 50 MG PO TB24: 50.0000 mg | ORAL_TABLET | Freq: Every day | ORAL | Status: DC

## 2014-02-09 NOTE — Progress Notes (Signed)
K+ 2.8 Dr Meda Coffee made aware, procedure cancelled, K+ 40 meg PO given per order, RX given to patient, procedure re-scheduled on Monday at 1000, instructed patient to be here at 0800

## 2014-02-09 NOTE — Interval H&P Note (Signed)
History and Physical Interval Note:  02/09/2014 9:51 AM  Colleen [name]  has presented today for surgery, with the diagnosis of AFIB  The various methods of treatment have been discussed with the patient and family. After consideration of risks, benefits and other options for treatment, the patient has consented to  Procedure(s): CARDIOVERSION (N/A) as a surgical intervention .  The patient's history has been reviewed, patient examined, no change in status, stable for surgery.  I have reviewed the patient's chart and labs.  Questions were answered to the patient's satisfaction.     Ena Dawley, H

## 2014-02-09 NOTE — CV Procedure (Signed)
    Cardioversion Note  Colleen [name] [id number] 11/12/1951  Procedure: DC Cardioversion Indications: a-fib  The patient presented for a scheduled elective cardioversion. She had a witnessed syncopal episode in the parking lot. She was not wearing home O2 at the time and her O2 sats were 84%, improved to 94 % with 2 liters of oxygen.  Her potassium today was 2.8, BP 149/86, HR 120 BPM. We will replace K with KCl 40 mEq today, followed by 20 mEq daily until the procedure that we will reschedule for Monday 02/14/2014. We will increase Toprol XL to 50 mg PO daily.  [name], MD, [facility] 02/09/2014, 9:52 AM

## 2014-02-09 NOTE — H&P (View-Only) (Signed)
Patient Name: Colleen [name] Date of Encounter: 01/25/2014  Primary Care Provider:  [name], NP Primary Cardiologist:  B. Stanford Breed, MD   Patient Profile  63 year old female who is scheduled to have a cardioversion last week but canceled and presents for followup.  Problem List   Past Medical History  Diagnosis Date  . Hypertension   . Hiatal hernia   . Adenomatous polyp of colon   . Acute asthmatic bronchitis   . GERD (gastroesophageal reflux disease)     EGD neg 03/07/2008, but prev required dilation  . COPD (chronic obstructive pulmonary disease)   . IBS (irritable bowel syndrome)     chronic  . Urinary incontinence   . Esophagitis   . Chronic gastritis   . Duodenitis without hemorrhage 10/01/2001  . Diverticulosis   . Personal history of colonic adenomas 02/05/2008        . Anemia   . Hyperlipidemia   . Decreased peripheral vision of right eye   . Pneumonia 2011  . Hyperthyroidism   . Type II diabetes mellitus   . Stroke ~ 2009; 12/2013 X 3    a. Hx of stroke and ministrokes. b. TIA 12/2013, dx with AF at that time.  . Arthritis   . Depression   . On home oxygen therapy     "2L; suppose to use 24/7" (01/05/2014)  . PAF (paroxysmal atrial fibrillation)     a. Prior hx. b. Recurred 11/2013 in setting of possible TIA. Lifelong Coumadin planned. Spont converted to NSR. EF 60-65% by echo 11/2013, normal cors 2007, normal nuc 2011.   . Atrial flutter     a. Dx 12/2013. s/p DCCV. On anticoagulation.   Past Surgical History  Procedure Laterality Date  . Colonoscopy    . Upper gi endoscopy    . Cholecystectomy  1980's  . Vaginal hysterectomy  1992  . Breast biopsy Left 1987    "milk gland"  . Cardioversion N/A 01/06/2014    Procedure: CARDIOVERSION;  Surgeon: Lelon Perla, MD;  Location: [facility] OR;  Service: Cardiovascular;  Laterality: N/A;    Allergies  Allergies  Allergen Reactions  . Latex Hives    HPI  63 year old female with the above problem list.  I  just saw in clinic last week at which time she was back in A. Fib/flutter.  After discussion with Dr. Stanford Breed, we decided to increase her flecainide to 100 b.i.d. And arranged for a cardioversion to take place last week.  Unfortunately, she canceled this.  Today, she presented for an exercise treadmill which was scheduled due to ongoing flecainide therapy however this was canceled in the setting of A. Fib with RVR with rates in the 130s.  Her point was then changed to clinic visit so that we could rearrange her cardioversion.  Over the past week, she's continued to have fatigue and mild dyspnea exertion.  She denies chest pain, palpitations, or presyncope.she says her heart rates at home are fairly variable.  She cancel cardioversion because she was scared however at this point is willing to undergo cardioversion.  Home Medications  Prior to Admission medications   Medication Sig Start Date End Date Taking? Authorizing Provider  albuterol (PROVENTIL) (2.5 MG/3ML) 0.083% nebulizer solution Take 2.5 mg by nebulization 2 (two) times daily.    Yes Historical Provider, MD  ALPRAZolam Duanne Moron) 0.5 MG tablet Take 0.5 mg by mouth at bedtime.    Yes Historical Provider, MD  atorvastatin (LIPITOR) 20 MG tablet Take 1  tablet (20 mg total) by mouth every evening. 01/06/14  Yes Dayna N Dunn, PA-C  cetirizine (ZYRTEC) 10 MG tablet Take 10 mg by mouth at bedtime.    Yes Historical Provider, MD  diltiazem (CARDIZEM CD) 240 MG 24 hr capsule Take 1 capsule (240 mg total) by mouth daily. 01/06/14  Yes Dayna N Dunn, PA-C  DULoxetine (CYMBALTA) 60 MG capsule Take 120 mg by mouth daily.   Yes Historical Provider, MD  ferrous sulfate 325 (65 FE) MG tablet Take 325 mg by mouth 2 (two) times daily with a meal.   Yes Historical Provider, MD  flecainide (TAMBOCOR) 100 MG tablet Take 1 tablet (100 mg total) by mouth every 12 (twelve) hours. 01/17/14  Yes Rogelia Mire, NP  Fluticasone-Salmeterol (ADVAIR DISKUS) 250-50 MCG/DOSE  AEPB Inhale 1 puff into the lungs 2 (two) times daily.    Yes Historical Provider, MD  furosemide (LASIX) 40 MG tablet Take 40 mg by mouth daily.    Yes Historical Provider, MD  HYDROcodone-acetaminophen (NORCO) 7.5-325 MG per tablet Take 1 tablet by mouth every 6 (six) hours as needed for moderate pain.   Yes Historical Provider, MD  levalbuterol [facility] HFA) 45 MCG/ACT inhaler Inhale 2 puffs into the lungs every 4 (four) hours as needed for wheezing or shortness of breath.   Yes Historical Provider, MD  metFORMIN (GLUMETZA) 1000 MG (MOD) 24 hr tablet Take 1,000 mg by mouth 2 (two) times daily with a meal.   Yes Historical Provider, MD  methimazole (TAPAZOLE) 5 MG tablet Take 5 mg by mouth daily.   Yes Historical Provider, MD  metoprolol succinate (TOPROL-XL) 25 MG 24 hr tablet Take 25 mg by mouth every morning.    Yes Historical Provider, MD  omeprazole (PRILOSEC) 40 MG capsule Take 40 mg by mouth 2 (two) times daily.    Yes Historical Provider, MD  solifenacin (VESICARE) 10 MG tablet Take 10 mg by mouth daily.   Yes Historical Provider, MD  tiotropium (SPIRIVA) 18 MCG inhalation capsule Place 18 mcg into inhaler and inhale daily.    Yes Historical Provider, MD  trimethoprim (TRIMPEX) 100 MG tablet Take 100 mg by mouth at bedtime.   Yes Historical Provider, MD  warfarin (COUMADIN) 3 MG tablet Take 1 tablet (3 mg total) by mouth See admin instructions. No Coumadin today (01/06/14). Tomorrow, start 3mg  every day except Wednesdays. On Wednesdays, take 4mg . 01/06/14  Yes Dayna N Dunn, PA-C  warfarin (COUMADIN) 4 MG tablet Take 1 tablet (4 mg total) by mouth See admin instructions. No Coumadin today (01/06/14). Tomorrow, start 3mg  every day except Wednesdays. On Wednesdays, take 4mg . 01/06/14  Yes Dayna N Dunn, PA-C    Family History  Family History  Problem Relation Age of Onset  . Heart disease Mother   . Asthma Father   . Lung cancer Father   . Asthma Sister   . Heart disease Sister   . Colon cancer  Father     Social History  History   Social History  . Marital Status: Legally Separated    Spouse Name: N/A    Number of Children: N/A  . Years of Education: N/A   Occupational History  . Not on file.   Social History Main Topics  . Smoking status: Current Every Day Smoker -- 1.00 packs/day for 51 years    Types: Cigarettes  . Smokeless tobacco: Never Used  . Alcohol Use: No  . Drug Use: No  . Sexual Activity: No  Other Topics Concern  . Not on file   Social History Narrative  . No narrative on file     Review of Systems General: her only complaint is fatigue. No chills, fever, night sweats or weight changes.  Cardiovascular:  No chest pain, dyspnea on exertion, edema, orthopnea, palpitations, paroxysmal nocturnal dyspnea. Dermatological: No rash, lesions/masses Respiratory: No cough, mild, chronic dyspnea on exertion. Urologic: No hematuria, dysuria Abdominal:   No nausea, vomiting, diarrhea, bright red blood per rectum, melena, or hematemesis Neurologic:  No visual changes, wkns, changes in mental status. All other systems reviewed and are otherwise negative except as noted above.  Physical Exam  Blood pressure 100/78, pulse 111, height 5\' 5"  (1.651 m), weight 179 lb (81.194 kg).  General: Pleasant, NAD Psych: Normal affect. Neuro: Alert and oriented X 3. Moves all extremities spontaneously. HEENT: Normal  Neck: Supple without bruits or JVD. Lungs:  Resp regular and unlabored, diminished breath sounds bilaterally. Heart: irregularly, irregular, tachycardic, no s3, s4, or murmurs. Abdomen: Soft, non-tender, non-distended, BS + x 4.  Extremities: No clubbing, cyanosis or edema. DP/PT/Radials 2+ and equal bilaterally.  Accessory Clinical Findings  ECG - afib, 136, poor r prog, no acute st/t changes.  Assessment & Plan  1.  Atrial fibrillation with rapid ventricular response: As above, patient's flecainide dose was increased to 100 mg b.i.d. Last week.  She  remains on diltiazem.  She was supposed to undergo cardioversion last week but canceled at the last minute.  She is now willing to undergo cardioversion.  Looking back, her INRs have been therapeutic.  Will repeat INR today and schedule for cardioversion [facility] at [facility].  Following cardioversion, she will require exercise treadmill testing when she is in sinus rhythm.  We will arrange this for the next week or 2.  If she is unable to maintain sinus rhythm on flecainide, we would plan to discontinue flecainide and we'll need to consider an alternate antiarrhythmic.  2.  Tobacco abuse: She continues to smoke.  Cessation counseling provided.  She's not currently motivated to quit.  3.  Disposition: Cardioversion tomorrow provided the INR is therapeutic.  Followup exercise treadmill testing because of ongoing flecainide therapy within the next one to 2 weeks.  Follow up with Dr. Stanford Breed in the next 4-6 weeks.   Murray Hodgkins, NP 01/25/2014, 10:42 AM

## 2014-02-11 ENCOUNTER — Telehealth: Payer: Self-pay | Admitting: Cardiology

## 2014-02-11 ENCOUNTER — Encounter (HOSPITAL_COMMUNITY): Payer: Self-pay | Admitting: Pharmacy Technician

## 2014-02-11 NOTE — Telephone Encounter (Signed)
New message ° ° °Returning call back to nurse from yesterday.   °

## 2014-02-11 NOTE — Telephone Encounter (Signed)
Repeat bmet Monday prior to dccv [facility]

## 2014-02-11 NOTE — Telephone Encounter (Signed)
Patient phoned stating that she went to get her DCCV and was not able to get secondary to K+ being 2.8. Patient was given Rx for K+ and to decrease her Metoprolol to 50 mg daily. Patient stated she had not been taking Metoprolol. Dr Meda Coffee was the MD scheduled to do the cardioversion. Discussed with Dr Meda Coffee and patient needs to be on Metoprolol (toprol) 50 mg daily and should have K+ 40 meq daily for three days and then 20 meq daily. Needs to be rechecked day of cardioversion.   Left message to call back

## 2014-02-11 NOTE — Telephone Encounter (Signed)
Spoke with patient and she has been taking the K+ and reviewed the directions with her. Patient does now have the Metoprolol and will take 50 mg daily. Will forward to Luisa Dago RN and Dr Stanford Breed

## 2014-02-14 ENCOUNTER — Encounter (HOSPITAL_COMMUNITY): Admission: RE | Payer: Self-pay | Source: Ambulatory Visit

## 2014-02-14 ENCOUNTER — Inpatient Hospital Stay (HOSPITAL_COMMUNITY)
Admission: EM | Admit: 2014-02-14 | Discharge: 2014-02-19 | DRG: 308 | Disposition: A | Discharge status: Home Health | Payer: Medicaid Other | Attending: Internal Medicine | Admitting: Internal Medicine

## 2014-02-14 ENCOUNTER — Emergency Department (HOSPITAL_COMMUNITY): Payer: Medicaid Other

## 2014-02-14 ENCOUNTER — Other Ambulatory Visit: Payer: Self-pay

## 2014-02-14 ENCOUNTER — Encounter (HOSPITAL_COMMUNITY): Payer: Self-pay | Admitting: Emergency Medicine

## 2014-02-14 ENCOUNTER — Ambulatory Visit (HOSPITAL_COMMUNITY): Admission: RE | Admit: 2014-02-14 | Payer: Medicaid Other | Source: Ambulatory Visit | Admitting: Cardiology

## 2014-02-14 DIAGNOSIS — Z7901 Long term (current) use of anticoagulants: Secondary | ICD-10-CM

## 2014-02-14 DIAGNOSIS — F3289 Other specified depressive episodes: Secondary | ICD-10-CM | POA: Diagnosis present

## 2014-02-14 DIAGNOSIS — I10 Essential (primary) hypertension: Secondary | ICD-10-CM | POA: Diagnosis present

## 2014-02-14 DIAGNOSIS — D509 Iron deficiency anemia, unspecified: Secondary | ICD-10-CM | POA: Diagnosis present

## 2014-02-14 DIAGNOSIS — E0590 Thyrotoxicosis, unspecified without thyrotoxic crisis or storm: Secondary | ICD-10-CM | POA: Diagnosis present

## 2014-02-14 DIAGNOSIS — E785 Hyperlipidemia, unspecified: Secondary | ICD-10-CM | POA: Diagnosis present

## 2014-02-14 DIAGNOSIS — I4892 Unspecified atrial flutter: Secondary | ICD-10-CM | POA: Diagnosis present

## 2014-02-14 DIAGNOSIS — J4489 Other specified chronic obstructive pulmonary disease: Secondary | ICD-10-CM

## 2014-02-14 DIAGNOSIS — G92 Toxic encephalopathy: Secondary | ICD-10-CM | POA: Diagnosis not present

## 2014-02-14 DIAGNOSIS — E878 Other disorders of electrolyte and fluid balance, not elsewhere classified: Secondary | ICD-10-CM | POA: Diagnosis present

## 2014-02-14 DIAGNOSIS — I359 Nonrheumatic aortic valve disorder, unspecified: Secondary | ICD-10-CM

## 2014-02-14 DIAGNOSIS — J449 Chronic obstructive pulmonary disease, unspecified: Secondary | ICD-10-CM | POA: Diagnosis present

## 2014-02-14 DIAGNOSIS — E876 Hypokalemia: Secondary | ICD-10-CM | POA: Diagnosis present

## 2014-02-14 DIAGNOSIS — E119 Type 2 diabetes mellitus without complications: Secondary | ICD-10-CM

## 2014-02-14 DIAGNOSIS — I4891 Unspecified atrial fibrillation: Principal | ICD-10-CM

## 2014-02-14 DIAGNOSIS — J9611 Chronic respiratory failure with hypoxia: Secondary | ICD-10-CM

## 2014-02-14 DIAGNOSIS — J189 Pneumonia, unspecified organism: Secondary | ICD-10-CM

## 2014-02-14 DIAGNOSIS — G929 Unspecified toxic encephalopathy: Secondary | ICD-10-CM | POA: Diagnosis not present

## 2014-02-14 DIAGNOSIS — J9610 Chronic respiratory failure, unspecified whether with hypoxia or hypercapnia: Secondary | ICD-10-CM | POA: Diagnosis present

## 2014-02-14 DIAGNOSIS — R55 Syncope and collapse: Secondary | ICD-10-CM

## 2014-02-14 DIAGNOSIS — F17200 Nicotine dependence, unspecified, uncomplicated: Secondary | ICD-10-CM

## 2014-02-14 DIAGNOSIS — K219 Gastro-esophageal reflux disease without esophagitis: Secondary | ICD-10-CM | POA: Diagnosis present

## 2014-02-14 DIAGNOSIS — Z8673 Personal history of transient ischemic attack (TIA), and cerebral infarction without residual deficits: Secondary | ICD-10-CM

## 2014-02-14 DIAGNOSIS — Z9981 Dependence on supplemental oxygen: Secondary | ICD-10-CM

## 2014-02-14 DIAGNOSIS — Z79899 Other long term (current) drug therapy: Secondary | ICD-10-CM

## 2014-02-14 DIAGNOSIS — F329 Major depressive disorder, single episode, unspecified: Secondary | ICD-10-CM | POA: Diagnosis present

## 2014-02-14 LAB — CBC WITH DIFFERENTIAL/PLATELET
BASOS PCT: 0 % (ref 0–1)
Basophils Absolute: 0 10*3/uL (ref 0.0–0.1)
Eosinophils Absolute: 0 10*3/uL (ref 0.0–0.7)
Eosinophils Relative: 1 % (ref 0–5)
HCT: 39.5 % (ref 36.0–46.0)
Hemoglobin: 11.2 g/dL — ABNORMAL LOW (ref 12.0–15.0)
Lymphocytes Relative: 24 % (ref 12–46)
Lymphs Abs: 1.8 10*3/uL (ref 0.7–4.0)
MCH: 21.3 pg — ABNORMAL LOW (ref 26.0–34.0)
MCHC: 28.4 g/dL — AB (ref 30.0–36.0)
MCV: 75.1 fL — ABNORMAL LOW (ref 78.0–100.0)
Monocytes Absolute: 0.8 10*3/uL (ref 0.1–1.0)
Monocytes Relative: 10 % (ref 3–12)
NEUTROS ABS: 4.9 10*3/uL (ref 1.7–7.7)
NEUTROS PCT: 65 % (ref 43–77)
PLATELETS: 276 10*3/uL (ref 150–400)
RBC: 5.26 MIL/uL — ABNORMAL HIGH (ref 3.87–5.11)
RDW: 20.3 % — AB (ref 11.5–15.5)
WBC: 7.6 10*3/uL (ref 4.0–10.5)

## 2014-02-14 LAB — BASIC METABOLIC PANEL
BUN: 18 mg/dL (ref 6–23)
CO2: 30 mEq/L (ref 19–32)
CREATININE: 0.88 mg/dL (ref 0.50–1.10)
Calcium: 9.1 mg/dL (ref 8.4–10.5)
Chloride: 94 mEq/L — ABNORMAL LOW (ref 96–112)
GFR calc non Af Amer: 68 mL/min — ABNORMAL LOW (ref >90)
GFR, EST AFRICAN AMERICAN: 79 mL/min — AB (ref >90)
Glucose, Bld: 115 mg/dL — ABNORMAL HIGH (ref 70–99)
POTASSIUM: 3.4 meq/L — AB (ref 3.7–5.3)
SODIUM: 138 meq/L (ref 137–147)

## 2014-02-14 LAB — POCT I-STAT TROPONIN I: Troponin i, poc: 0.03 ng/mL (ref 0.00–0.08)

## 2014-02-14 LAB — PROTIME-INR
INR: 2.45 — ABNORMAL HIGH (ref 0.00–1.49)
Prothrombin Time: 25.8 seconds — ABNORMAL HIGH (ref 11.6–15.2)

## 2014-02-14 LAB — MAGNESIUM: Magnesium: 1.7 mg/dL (ref 1.5–2.5)

## 2014-02-14 LAB — PRO B NATRIURETIC PEPTIDE: Pro B Natriuretic peptide (BNP): 6018 pg/mL — ABNORMAL HIGH (ref 0–125)

## 2014-02-14 SURGERY — CARDIOVERSION
Anesthesia: Monitor Anesthesia Care

## 2014-02-14 MED ORDER — INSULIN ASPART 100 UNIT/ML ~~LOC~~ SOLN
0.0000 [IU] | Freq: Three times a day (TID) | SUBCUTANEOUS | Status: DC
  Administered 2014-02-15: 2 [IU] via SUBCUTANEOUS
  Administered 2014-02-16: 1 [IU] via SUBCUTANEOUS
  Administered 2014-02-17: 2 [IU] via SUBCUTANEOUS
  Administered 2014-02-17: 1 [IU] via SUBCUTANEOUS
  Administered 2014-02-18: 2 [IU] via SUBCUTANEOUS
  Administered 2014-02-19: 3 [IU] via SUBCUTANEOUS

## 2014-02-14 MED ORDER — DEXTROSE 5 % IV SOLN
1.0000 g | Freq: Three times a day (TID) | INTRAVENOUS | Status: DC
  Administered 2014-02-15 – 2014-02-19 (×13): 1 g via INTRAVENOUS
  Filled 2014-02-14 (×16): qty 1

## 2014-02-14 MED ORDER — DILTIAZEM HCL 100 MG IV SOLR
5.0000 mg/h | INTRAVENOUS | Status: DC
  Administered 2014-02-14: 5 mg/h via INTRAVENOUS
  Administered 2014-02-14: 15 mg/h via INTRAVENOUS
  Administered 2014-02-15: 10 mg/h via INTRAVENOUS
  Administered 2014-02-15: 15 mg/h via INTRAVENOUS
  Filled 2014-02-14 (×3): qty 100

## 2014-02-14 MED ORDER — VANCOMYCIN HCL 10 G IV SOLR
1500.0000 mg | Freq: Once | INTRAVENOUS | Status: AC
  Administered 2014-02-14: 1500 mg via INTRAVENOUS
  Filled 2014-02-14: qty 1500

## 2014-02-14 MED ORDER — VANCOMYCIN HCL 10 G IV SOLR: 20.0000 mg/kg | Freq: Once | INTRAVENOUS | Status: DC

## 2014-02-14 MED ORDER — DILTIAZEM LOAD VIA INFUSION
10.0000 mg | Freq: Once | INTRAVENOUS | Status: AC
  Administered 2014-02-14: 10 mg via INTRAVENOUS
  Filled 2014-02-14: qty 10

## 2014-02-14 MED ORDER — REGADENOSON 0.4 MG/5ML IV SOLN
0.4000 mg | Freq: Once | INTRAVENOUS | Status: AC
  Administered 2014-02-15: 0.4 mg via INTRAVENOUS
  Filled 2014-02-14: qty 5

## 2014-02-14 MED ORDER — WARFARIN - PHARMACIST DOSING INPATIENT: Freq: Every day | Status: DC

## 2014-02-14 MED ORDER — POTASSIUM CHLORIDE CRYS ER 20 MEQ PO TBCR
40.0000 meq | EXTENDED_RELEASE_TABLET | Freq: Once | ORAL | Status: AC
  Administered 2014-02-14: 40 meq via ORAL
  Filled 2014-02-14: qty 2

## 2014-02-14 MED ORDER — DEXTROSE 5 % IV SOLN
2.0000 g | Freq: Once | INTRAVENOUS | Status: AC
  Administered 2014-02-14: 2 g via INTRAVENOUS

## 2014-02-14 MED ORDER — VANCOMYCIN HCL IN DEXTROSE 1-5 GM/200ML-% IV SOLN
1000.0000 mg | Freq: Two times a day (BID) | INTRAVENOUS | Status: DC
  Administered 2014-02-15 – 2014-02-17 (×6): 1000 mg via INTRAVENOUS
  Filled 2014-02-14 (×8): qty 200

## 2014-02-14 NOTE — Progress Notes (Addendum)
ANTIBIOTIC CONSULT NOTE - INITIAL  Pharmacy Consult for cefepime Indication: rule out pneumonia  Allergies  Allergen Reactions  . Crestor [Rosuvastatin]   . Latex Hives  . Wellbutrin [Bupropion]     Patient Measurements: Height: 5\' 5"  (165.1 cm) Weight: 188 lb (85.276 kg) IBW/kg (Calculated) : 57 Adjusted Body Weight:   Vital Signs: Temp: 98.5 F (36.9 C) (02/16 1616) Temp src: Oral (02/16 1616) BP: 121/94 mmHg (02/16 1754) Intake/Output from previous day:   Intake/Output from this shift:    Labs:  Recent Labs  02/14/14 1729  WBC 7.6  HGB 11.2*  PLT 276  CREATININE 0.88   Estimated Creatinine Clearance: 70.6 ml/min (by C-G formula based on Cr of 0.88). No results found for this basename: VANCOTROUGH, VANCOPEAK, VANCORANDOM, GENTTROUGH, GENTPEAK, GENTRANDOM, TOBRATROUGH, TOBRAPEAK, TOBRARND, AMIKACINPEAK, AMIKACINTROU, AMIKACIN,  in the last 72 hours   Microbiology: No results found for this or any previous visit (from the past 720 hour(s)).  Medical History: Past Medical History  Diagnosis Date  . Hypertension   . Hiatal hernia   . Adenomatous polyp of colon   . Acute asthmatic bronchitis   . GERD (gastroesophageal reflux disease)     EGD neg 03/07/2008, but prev required dilation  . COPD (chronic obstructive pulmonary disease)   . IBS (irritable bowel syndrome)     chronic  . Urinary incontinence   . Esophagitis   . Chronic gastritis   . Duodenitis without hemorrhage 10/01/2001  . Diverticulosis   . Personal history of colonic adenomas 02/05/2008        . Anemia   . Hyperlipidemia   . Decreased peripheral vision of right eye   . Pneumonia 2011  . Hyperthyroidism   . Type II diabetes mellitus   . Stroke ~ 2009; 12/2013 X 3    a. Hx of stroke and ministrokes. b. TIA 12/2013, dx with AF at that time.  . Arthritis   . Depression   . On home oxygen therapy     "2L; suppose to use 24/7" (01/05/2014)  . PAF (paroxysmal atrial fibrillation)     a. Prior hx.  b. Recurred 11/2013 in setting of possible TIA. Lifelong Coumadin planned. Spont converted to NSR. EF 60-65% by echo 11/2013, normal cors 2007, normal nuc 2011.   . Atrial flutter     a. Dx 12/2013. s/p DCCV. On anticoagulation.    Medications:  Anti-infectives   Start     Dose/Rate Route Frequency Ordered Stop   02/15/14 0400  ceFEPIme (MAXIPIME) 1 g in dextrose 5 % 50 mL IVPB     1 g 100 mL/hr over 30 Minutes Intravenous Every 8 hours 02/14/14 1825     02/14/14 1815  vancomycin (VANCOCIN) 1,706 mg in sodium chloride 0.9 % 500 mL IVPB  Status:  Discontinued     20 mg/kg  85.3 kg 250 mL/hr over 120 Minutes Intravenous  Once 02/14/14 1809 02/14/14 1810   02/14/14 1815  vancomycin (VANCOCIN) 1,500 mg in sodium chloride 0.9 % 500 mL IVPB     1,500 mg 250 mL/hr over 120 Minutes Intravenous  Once 02/14/14 1811     02/14/14 1815  ceFEPIme (MAXIPIME) 2 g in dextrose 5 % 50 mL IVPB     2 g 100 mL/hr over 30 Minutes Intravenous  Once 02/14/14 1813       Assessment: 83 yof presented to the ED with history of falling and confusion. To start empiric cefepime for possible HCAP. Allergy to penicillin noted but  no reaction documented. When I went to clarify with the patient she stated that she does not have an allergy to penicillin so I have removed it from her allergy list. Pt is afebrile, WBC and SCr are WNL. She has also been ordered a 1x dose of vancomycin.   Cefepime 2/16>> Vanc 2/16 x 1  Goal of Therapy:  Eradication of infection  Plan:  1. Cefepime 2gm IV x 1 then 1gm IV Q8H 2. F/u continuation of vancomycin 3. F/u renal fxn, C&S, clinical status  Tiffony Kite, Rande Lawman 02/14/2014,6:26 PM  Addendum: Continuing vancomycin. Already received loading dose of 1500mg  earlier. Also continuing warfarin. INR is therapeutic and pt already took her dose today.   Plan: 1. Vancomycin 1gm IV Q12H 2. F/u renal fxn, C&S, clinical status and trough at SS 3. Daily INR  Salome Arnt, PharmD,  BCPS Pager # [phone] 02/14/2014 9:43 PM

## 2014-02-14 NOTE — Consult Note (Signed)
Patient ID: Colleen [name] MRN: [id number], DOB/AGE: Jan 13, 1951   Admit date: 02/14/2014   Primary Physician: [name], NP Primary Cardiologist: B. Stanford Breed, MD   Pt. Profile:  63 y/o female with a h/o afib/flutter on flecainide and pending DCCV who presented to the ED today 2/2 recurrent syncope and ongoing RVR.  Problem List  Past Medical History  Diagnosis Date  . Hypertension   . Hiatal hernia   . Adenomatous polyp of colon   . Acute asthmatic bronchitis   . GERD (gastroesophageal reflux disease)     EGD neg 03/07/2008, but prev required dilation  . COPD (chronic obstructive pulmonary disease)   . IBS (irritable bowel syndrome)     chronic  . Urinary incontinence   . Esophagitis   . Chronic gastritis   . Duodenitis without hemorrhage 10/01/2001  . Diverticulosis   . Personal history of colonic adenomas 02/05/2008        . Anemia   . Hyperlipidemia   . Decreased peripheral vision of right eye   . Pneumonia 2011  . Hyperthyroidism   . Type II diabetes mellitus   . Stroke     a. Hx of stroke and ministrokes ~ 2009. b. TIA 11/2013, dx with AF at that time.  . Arthritis   . Depression   . On home oxygen therapy     "2L; suppose to use 24/7" (01/05/2014)  . PAF (paroxysmal atrial fibrillation)     a. Prior hx. b. Recurred 11/2013 in setting of possible TIA. Lifelong Coumadin planned. Spont converted to NSR. EF 60-65% by echo 11/2013, normal cors 2007, normal nuc 2011.   . Atrial flutter     a. Dx 12/2013. s/p DCCV. On anticoagulation.    Past Surgical History  Procedure Laterality Date  . Colonoscopy    . Upper gi endoscopy    . Cholecystectomy  1980's  . Vaginal hysterectomy  1992  . Breast biopsy Left 1987    "milk gland"  . Cardioversion N/A 01/06/2014    Procedure: CARDIOVERSION;  Surgeon: Lelon Perla, MD;  Location: [facility] OR;  Service: Cardiovascular;  Laterality: N/A;     Allergies  Allergies  Allergen Reactions  . Crestor [Rosuvastatin]   . Latex  Hives  . Wellbutrin [Bupropion]     HPI  63 y/o female with a h/o of paroxysmal afib/flutter dating back to 11/2013 at which time she was admitted with TIA and noted to have PAF, which spontaneously converted.  She was placed on coumadin/bb/dilt and subsequently discharged.  EF on Echo during that admission was 60-65% with grade II diast dysfxn.  She also has a h/o COPD and continues to smoke cigarettes.  Unfortunately, she was readmitted to [facility] in early January 2/2 recurrent PAF with complaints of fatigue and DOE.  During that admission, she was successfully cardioverted and placed on flecainide 50mg  BID.  When she followed up in the office 2 wks later, she was back in afib with rates in the 140's.  BP was soft and after discussion, decision was made to increase her flecainide to 100mg  bid and arrangements were made for repeat cardioversion.  Unfortunately pt decided to cancel and when she returned to clinic for f/u and ETT on 1/27, she remained in rapid afib with ongoing complaint of dyspnea and fatigue.  Again, arrangements were made for DCCV but there was some mix up in scheduling and it was again cancelled.  DCCV was then rescheduled for 2/11 but when pt  presented, she was found to be hypokalemic and it was again deferred.  She was given potassium supplementation and her metoprolol dose was increased to 50 mg daily.  Pt was to be rescheduled for DCCV today according to notes.  Pt says however that on Friday 2/13, she was coming into the hospital for cardioversion and after getting out of her car she became lightheaded and lost consciousness, falling to the ground.  This was witnessed by her husband and he assisted her in getting up.  She apparently regained consciousness immediately.  Upon regaining consciousness she felt "funny" but was no longer having lightheadedness, and at no point did she experience chest pain, dyspnea, or palpitations.  She says several times that this happened on Friday, however  there is no record that DCCV was ever scheduled for Friday or that she was seen in Endo that day.  Yesterday, she was at home preparing her coffee and had another syncopal episode.  She does not recall feeling any sort of prodrome, saying that she was standing and getting her pills and coffee together and the next thing she knew, her dtr was helping her up off of the floor.  Again, she apparently regained consciousness immediately.  She says that there may have been a third syncopal episode but she can't recall when that would have been.    Because of recurrent syncope, her family took her to see her PCP today.  Decision was made to have her transported to the ED for evaluation.  Here, initial ECG showed afib/flutter with a rate of 140.  She was placed on IV dilt with rates currently in the low 100's.  She denies any presyncope, palpitations, dyspnea, or chest pain at present.  We've been asked to eval.  Home Medications  Prior to Admission medications   Medication Sig Start Date End Date Taking? Authorizing Provider  albuterol (PROVENTIL) (2.5 MG/3ML) 0.083% nebulizer solution Take 2.5 mg by nebulization 2 (two) times daily.    Yes Historical Provider, MD  ALPRAZolam Duanne Moron) 0.5 MG tablet Take 0.5 mg by mouth at bedtime.    Yes Historical Provider, MD  atorvastatin (LIPITOR) 20 MG tablet Take 1 tablet (20 mg total) by mouth every evening. 01/06/14  Yes Dayna N Dunn, PA-C  cetirizine (ZYRTEC) 10 MG tablet Take 10 mg by mouth at bedtime.    Yes Historical Provider, MD  diltiazem (CARDIZEM CD) 240 MG 24 hr capsule Take 1 capsule (240 mg total) by mouth daily. 01/06/14  Yes Dayna N Dunn, PA-C  DULoxetine (CYMBALTA) 60 MG capsule Take 120 mg by mouth daily.   Yes Historical Provider, MD  ferrous sulfate 325 (65 FE) MG tablet Take 325 mg by mouth 2 (two) times daily with a meal.   Yes Historical Provider, MD  flecainide (TAMBOCOR) 100 MG tablet Take 1 tablet (100 mg total) by mouth every 12 (twelve) hours.  01/17/14  Yes Rogelia Mire, NP  Fluticasone-Salmeterol (ADVAIR DISKUS) 250-50 MCG/DOSE AEPB Inhale 1 puff into the lungs 2 (two) times daily.    Yes Historical Provider, MD  furosemide (LASIX) 40 MG tablet Take 40 mg by mouth daily.    Yes Historical Provider, MD  HYDROcodone-acetaminophen (NORCO) 7.5-325 MG per tablet Take 1 tablet by mouth every 6 (six) hours as needed for moderate pain.   Yes Historical Provider, MD  levalbuterol [facility] HFA) 45 MCG/ACT inhaler Inhale 2 puffs into the lungs every 4 (four) hours as needed for wheezing or shortness of breath.  Yes Historical Provider, MD  metFORMIN (GLUMETZA) 1000 MG (MOD) 24 hr tablet Take 1,000 mg by mouth 2 (two) times daily with a meal.   Yes Historical Provider, MD  methimazole (TAPAZOLE) 5 MG tablet Take 5 mg by mouth daily.   Yes Historical Provider, MD  metoprolol succinate (TOPROL-XL) 25 MG 24 hr tablet Take 25 mg by mouth daily. 2 daily   Yes Historical Provider, MD  omeprazole (PRILOSEC) 40 MG capsule Take 40 mg by mouth 2 (two) times daily.    Yes Historical Provider, MD  solifenacin (VESICARE) 10 MG tablet Take 10 mg by mouth daily.   Yes Historical Provider, MD  tiotropium (SPIRIVA) 18 MCG inhalation capsule Place 18 mcg into inhaler and inhale daily.    Yes Historical Provider, MD  trimethoprim (TRIMPEX) 100 MG tablet Take 100 mg by mouth at bedtime.   Yes Historical Provider, MD  warfarin (COUMADIN) 3 MG tablet Take 1 tablet (3 mg total) by mouth See admin instructions. No Coumadin today (01/06/14). Tomorrow, start 3mg  every day except Wednesdays. On Wednesdays, take 4mg . 01/06/14  Yes Dayna N Dunn, PA-C  warfarin (COUMADIN) 4 MG tablet Take 1 tablet (4 mg total) by mouth See admin instructions. No Coumadin today (01/06/14). Tomorrow, start 3mg  every day except Wednesdays. On Wednesdays, take 4mg . 01/06/14  Yes Dayna N Dunn, PA-C    Family History  Family History  Problem Relation Age of Onset  . Heart disease Mother   . Asthma  Father   . Lung cancer Father   . Asthma Sister   . Heart disease Sister   . Colon cancer Father     Social History  History   Social History  . Marital Status: Married    Spouse Name: N/A    Number of Children: N/A  . Years of Education: N/A   Occupational History  . Not on file.   Social History Main Topics  . Smoking status: Current Every Day Smoker -- 1.00 packs/day for 51 years    Types: Cigarettes  . Smokeless tobacco: Never Used  . Alcohol Use: No  . Drug Use: No  . Sexual Activity: No   Other Topics Concern  . Not on file   Social History Narrative  . No narrative on file     Review of Systems General:  No chills, fever, night sweats or weight changes.  Cardiovascular:  No chest pain, +++ dyspnea on exertion, occas LE edema, no orthopnea, occas palpitations, paroxysmal nocturnal dyspnea, +++ syncope as outlined above. Dermatological: No rash, lesions/masses Respiratory: No cough, chronic dyspnea on exertion Urologic: No hematuria, dysuria Abdominal:   +++ dark stools (on iron), No nausea, vomiting, diarrhea, bright red blood per rectum, melena, or hematemesis Neurologic:  No visual changes, wkns, changes in mental status. All other systems reviewed and are otherwise negative except as noted above.  Physical Exam  Blood pressure 109/78, pulse 117, temperature 98.5 F (36.9 C), temperature source Oral, resp. rate 16, height 5\' 5"  (1.651 m), weight 188 lb (85.276 kg), SpO2 94.00%.  General: Pleasant, NAD Psych: Normal affect. Neuro: Alert and oriented X 3 however gets confused in retelling story of syncope.  Moves all extremities spontaneously. HEENT: Normal  Neck: Supple without bruits or JVD. Lungs:  Resp regular and unlabored, scatt rhonci throughout, diminished breath sounds. Heart: IR, IR, tachy, distant, no s3, s4, or murmurs. Abdomen: Soft, non-tender, non-distended, BS + x 4.  Extremities: No clubbing, cyanosis, trace bilat LE edema. DP/PT/Radials  1+ and  equal bilaterally.  Labs  Troponin [facility] of Care Test)  Recent Labs  02/14/14 1737  TROPIPOC 0.03   Lab Results  Component Value Date   WBC 7.6 02/14/2014   HGB 11.2* 02/14/2014   HCT 39.5 02/14/2014   MCV 75.1* 02/14/2014   PLT 276 02/14/2014     Recent Labs Lab 02/14/14 1729  NA 138  K 3.4*  CL 94*  CO2 30  BUN 18  CREATININE 0.88  CALCIUM 9.1  GLUCOSE 115*   Lab Results  Component Value Date   CHOL 145 01/05/2014   HDL 35* 01/05/2014   LDLCALC 80 01/05/2014   TRIG 150* 01/05/2014   Lab Results  Component Value Date   INR 2.45* 02/14/2014   INR 2.34* 02/09/2014   INR 3.6* 01/25/2014    Radiology/Studies  Ct Head Wo Contrast  02/14/2014   CLINICAL DATA:  Fall, atrial fibrillation  EXAM: CT HEAD WITHOUT CONTRAST  IMPRESSION: No acute intracranial abnormality. 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 Mild cerebral atrophy again noted.   Electronically Signed   By: Lahoma Crocker M.D.   On: 02/14/2014 17:39   Dg Chest Portable 1 View  02/14/2014   CLINICAL DATA:  Weakness.  EXAM: PORTABLE CHEST - 1 VIEW  COMPARISON:  12/07/2013  FINDINGS: The heart is upper limits of normal in size. There is mild tortuosity and calcification of the thoracic aorta. There is a right lower lobe infiltrate and effusion. The left lung is clear. The bony thorax is intact.  IMPRESSION: Right lower lobe infiltrate and effusion.   Electronically Signed   By: Kalman Jewels M.D.   On: 02/14/2014 16:55    ECG  Aflutter, 140, poor r prog, no acute st/t changes.  ASSESSMENT AND PLAN  1.  Syncope:  Pt experienced at least 2 and possibly 3 brief syncopal episodes over the past week. Each was witnessed, each lasted just a few seconds, and at least one was preceded by presyncope/lightheadedness.  With ongoing afib/flutter and antiarrhythmic therapy, tachy/brady arrhythmias are of significant concern.  Agree with stepdown bed, follow on tele.  Prev nl EF on echo  11/2014.  2.  Afib/flutter RVR:  On flecainide 100mg  bid and coumadin. INR's have been therapeutic.  DCCV has been put off several times either b/c she wished to defer or more recently 2/2 hypokalemia.  Supp K tonight, cont bb/flecainide/IV dilt for the time being.  NPO after midnight and we will try and arrange DCCV for tomorrow.  [id number] = 4-5 (diastolic dysfxn w/o prior chf, htn, cva/tia, female).  If DCCV is successful and she remains on flecainide going forward, she will require stress testing.  She will not likely be able to walk on a treadmill.  3.  HTN:  Stable.  Prone soft BP's when HR's elevated.  Follow.  4.  COPD:  On home O2.  Inhalers per IM.  5.  DM:  On metformin @ home.  6.  Hypokalemia:  Supp.  7.  Anemia:  Stable.  Signed, Murray Hodgkins, NP 02/14/2014, 7:48 PM  I have seen and examined the patient along with Murray Hodgkins, NP.  I have reviewed the chart, notes and new data.  I agree with NP's note.  Key new complaints: recurrent syncope without prodrome; no palpitations noted  Key examination changes: small lacerations after falls, most notable on left elbow; irregular rhythm, emphysematous chest; no clinical signs of CHF Key new findings / data: low K, therapeutic INR  PLAN: Syncope, almost certainly arrhythmic. Differential diagnosis includes atrial flutter with 1:1 conduction and rapid ventricular response versus post tachycardia pauses in a patient on flecainide and low-dose beta blockers. Multiple coronary risk factors also raise the possibility for ischemia and malignant ventricular arrhythmia, especially in the setting of hypokalemia. Workup for CAD was in process when these events occurred. Admit and place on monitor. While on monitor I think we can continue flecainide and beta blockers and continue plans for cardioversion. Finish her ischemic workup during this hospitalization, probably best suited for [employer]. I think electrophysiology  consultation is indicated.  Sanda Klein, MD, [place] [phone] 02/14/2014, 8:26 PM

## 2014-02-14 NOTE — ED Notes (Signed)
Pt given a warm blanket 

## 2014-02-14 NOTE — ED Provider Notes (Signed)
CSN: [id number]     Arrival date & time 02/14/14  1608 History   First MD Initiated Contact with Patient 02/14/14 1618     Chief Complaint  Patient presents with  . Fall  . Atrial Fibrillation   HPI Comments: 63 yo F hx of HTN, HLD, DMII, COPD, paroxysmal afib s/p DCCV 01/06/14 on chronic anticoagulation, presents from PCP's office with CC syncope, atrial fib w/ RVR.  Pt states she was scheduled from [place] on Friday, which she was unable to get 2/2 hypokalemia.  Since then, pt has had two syncopal episodes (Sunday, Monday), and intermittent lightheadedness.  Pt's family also noted some confusion, which pt denies.  She denies head trauma, but did sustain skin tear to left elbow.  She went to PCP's office today on urging of family to be worked up for these syncopal episodes, and questionable confusion.  There pt was found to be in afib w/ RVR rate 140's.  She was sent to [facility] ED for further evaluation.  On arrival pt has no c/o.  Denies headache, lightheadedness, chest pain, palpitations, dyspnea, SOB, abdominal pain, nausea, vomiting, diarrhea, myalgias, rash, or any other symptoms.  Pt states she has been taking all of her meds, including Coumadin, Flecainide, Metoprolol, and Diltiazem.  Of note, recent note states pt reported she had not been taking Metoprolol recently, but she states she is taking this again.  The history is provided by the patient. No language interpreter was used.    Past Medical History  Diagnosis Date  . Hypertension   . Hiatal hernia   . Adenomatous polyp of colon   . Acute asthmatic bronchitis   . GERD (gastroesophageal reflux disease)     EGD neg 03/07/2008, but prev required dilation  . COPD (chronic obstructive pulmonary disease)   . IBS (irritable bowel syndrome)     chronic  . Urinary incontinence   . Esophagitis   . Chronic gastritis   . Duodenitis without hemorrhage 10/01/2001  . Diverticulosis   . Personal history of colonic adenomas 02/05/2008        .  Anemia   . Hyperlipidemia   . Decreased peripheral vision of right eye   . Pneumonia 2011  . Hyperthyroidism   . Type II diabetes mellitus   . Stroke ~ 2009; 12/2013 X 3    a. Hx of stroke and ministrokes. b. TIA 12/2013, dx with AF at that time.  . Arthritis   . Depression   . On home oxygen therapy     "2L; suppose to use 24/7" (01/05/2014)  . PAF (paroxysmal atrial fibrillation)     a. Prior hx. b. Recurred 11/2013 in setting of possible TIA. Lifelong Coumadin planned. Spont converted to NSR. EF 60-65% by echo 11/2013, normal cors 2007, normal nuc 2011.   . Atrial flutter     a. Dx 12/2013. s/p DCCV. On anticoagulation.   Past Surgical History  Procedure Laterality Date  . Colonoscopy    . Upper gi endoscopy    . Cholecystectomy  1980's  . Vaginal hysterectomy  1992  . Breast biopsy Left 1987    "milk gland"  . Cardioversion N/A 01/06/2014    Procedure: CARDIOVERSION;  Surgeon: Lelon Perla, MD;  Location: [facility] OR;  Service: Cardiovascular;  Laterality: N/A;   Family History  Problem Relation Age of Onset  . Heart disease Mother   . Asthma Father   . Lung cancer Father   . Asthma Sister   .  Heart disease Sister   . Colon cancer Father    History  Substance Use Topics  . Smoking status: Current Every Day Smoker -- 1.00 packs/day for 51 years    Types: Cigarettes  . Smokeless tobacco: Never Used  . Alcohol Use: No   OB History   Grav Para Term Preterm Abortions TAB SAB Ect Mult Living                 Review of Systems  Constitutional: Negative for fever, chills and diaphoresis.  Respiratory: Negative for cough and shortness of breath.   Cardiovascular: Negative for chest pain, palpitations and leg swelling.  Gastrointestinal: Negative for nausea, vomiting, abdominal pain and diarrhea.  Musculoskeletal: Negative for myalgias.  Skin: Negative for rash.  Neurological: Positive for syncope and light-headedness. Negative for dizziness, weakness, numbness and headaches.   Hematological: Negative for adenopathy. Does not bruise/bleed easily.  All other systems reviewed and are negative.      Allergies  Latex  Home Medications   Current Outpatient Rx  Name  Route  Sig  Dispense  Refill  . albuterol (PROVENTIL) (2.5 MG/3ML) 0.083% nebulizer solution   Nebulization   Take 2.5 mg by nebulization 2 (two) times daily.          [name] ALPRAZolam (XANAX) 0.5 MG tablet   Oral   Take 0.5 mg by mouth at bedtime.          [name] atorvastatin (LIPITOR) 20 MG tablet   Oral   Take 1 tablet (20 mg total) by mouth every evening.   30 tablet   6   . cetirizine (ZYRTEC) 10 MG tablet   Oral   Take 10 mg by mouth at bedtime.          [name] diltiazem (CARDIZEM CD) 240 MG 24 hr capsule   Oral   Take 1 capsule (240 mg total) by mouth daily.   30 capsule   6   . DULoxetine (CYMBALTA) 60 MG capsule   Oral   Take 120 mg by mouth daily.         . ferrous sulfate 325 (65 FE) MG tablet   Oral   Take 325 mg by mouth 2 (two) times daily with a meal.         . flecainide (TAMBOCOR) 100 MG tablet   Oral   Take 1 tablet (100 mg total) by mouth every 12 (twelve) hours.   60 tablet   6   . Fluticasone-Salmeterol (ADVAIR DISKUS) 250-50 MCG/DOSE AEPB   Inhalation   Inhale 1 puff into the lungs 2 (two) times daily.          . furosemide (LASIX) 40 MG tablet   Oral   Take 40 mg by mouth daily.          [name] HYDROcodone-acetaminophen (NORCO) 7.5-325 MG per tablet   Oral   Take 1 tablet by mouth every 6 (six) hours as needed for moderate pain.         [name] levalbuterol (XOPENEX HFA) 45 MCG/ACT inhaler   Inhalation   Inhale 2 puffs into the lungs every 4 (four) hours as needed for wheezing or shortness of breath.         . metFORMIN (GLUMETZA) 1000 MG (MOD) 24 hr tablet   Oral   Take 1,000 mg by mouth 2 (two) times daily with a meal.         . methimazole (TAPAZOLE) 5 MG tablet   Oral  Take 5 mg by mouth daily.         . metoprolol succinate  (TOPROL-XL) 25 MG 24 hr tablet   Oral   Take 25 mg by mouth as directed. 2 daily         . omeprazole (PRILOSEC) 40 MG capsule   Oral   Take 40 mg by mouth 2 (two) times daily.          . solifenacin (VESICARE) 10 MG tablet   Oral   Take 10 mg by mouth daily.         [name] tiotropium (SPIRIVA) 18 MCG inhalation capsule   Inhalation   Place 18 mcg into inhaler and inhale daily.          [name] trimethoprim (TRIMPEX) 100 MG tablet   Oral   Take 100 mg by mouth at bedtime.         [name] warfarin (COUMADIN) 3 MG tablet   Oral   Take 1 tablet (3 mg total) by mouth See admin instructions. No Coumadin today (01/06/14). Tomorrow, start 3mg  every day except Wednesdays. On Wednesdays, take 4mg .         . warfarin (COUMADIN) 4 MG tablet   Oral   Take 1 tablet (4 mg total) by mouth See admin instructions. No Coumadin today (01/06/14). Tomorrow, start 3mg  every day except Wednesdays. On Wednesdays, take 4mg .          BP 116/97  Temp(Src) 98.5 F (36.9 C) (Oral)  Resp 20  SpO2 91% Physical Exam  Nursing note and vitals reviewed. Constitutional: She is oriented to person, place, and time. She appears well-developed and well-nourished.  HENT:  Head: Normocephalic and atraumatic.  Right Ear: External ear normal.  Left Ear: External ear normal.  Nose: Nose normal.  Mouth/Throat: Oropharynx is clear and moist.  No external head trauma noted.  No hemotympanum, no signs of basilar skull fracture.  Eyes: Conjunctivae and EOM are normal. Pupils are equal, round, and reactive to light.  Neck: Normal range of motion. Neck supple.  Cardiovascular: Normal heart sounds and intact distal pulses.   afib RVR rate 140's.  Pulmonary/Chest: Effort normal and breath sounds normal. No respiratory distress. She has no wheezes. She has no rales. She exhibits no tenderness.  Abdominal: Soft. Bowel sounds are normal. She exhibits no distension and no mass. There is no tenderness. There is no rebound and no  guarding.  Musculoskeletal: Normal range of motion.  Neurological: She is alert and oriented to person, place, and time.  CN II-XII grossly intact.  No gross sensory or motor deficits.  Skin: Skin is warm and dry.  Small skin tear to left elbow, healing.    ED Course  Procedures (including critical care time) Labs Review Labs Reviewed  CBC WITH DIFFERENTIAL - Abnormal; Notable for the following:    RBC 5.26 (*)    Hemoglobin 11.2 (*)    MCV 75.1 (*)    MCH 21.3 (*)    MCHC 28.4 (*)    RDW 20.3 (*)    All other components within normal limits  BASIC METABOLIC PANEL - Abnormal; Notable for the following:    Potassium 3.4 (*)    Chloride 94 (*)    Glucose, Bld 115 (*)    GFR calc non Af Amer 68 (*)    GFR calc Af Amer 79 (*)    All other components within normal limits  PRO B NATRIURETIC PEPTIDE - Abnormal; Notable for the following:  Pro B Natriuretic peptide (BNP) 6018.0 (*)    All other components within normal limits  PROTIME-INR - Abnormal; Notable for the following:    Prothrombin Time 25.8 (*)    INR 2.45 (*)    All other components within normal limits  MAGNESIUM  PROTIME-INR  POCT I-STAT TROPONIN I   Imaging Review Ct Head Wo Contrast  02/14/2014   CLINICAL DATA:  Fall, atrial fibrillation  EXAM: CT HEAD WITHOUT CONTRAST  TECHNIQUE: Contiguous axial images were obtained from the base of the skull through the vertex without intravenous contrast.  COMPARISON:  12/07/2013  FINDINGS: No skull fracture is noted. There is mucosal thickening right maxillary sinus. No intracranial hemorrhage, mass effect or midline shift. No acute cortical infarction. No mass lesion is noted on this unenhanced scan. Stable encephalomalacia from prior infarct in left occipital lobe. Mild cerebral atrophy again noted.  IMPRESSION: No acute intracranial abnormality. 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 Mild cerebral atrophy again noted.   Electronically  Signed   By: Lahoma Crocker M.D.   On: 02/14/2014 17:39   Dg Chest Portable 1 View  02/14/2014   CLINICAL DATA:  Weakness.  EXAM: PORTABLE CHEST - 1 VIEW  COMPARISON:  12/07/2013  FINDINGS: The heart is upper limits of normal in size. There is mild tortuosity and calcification of the thoracic aorta. There is a right lower lobe infiltrate and effusion. The left lung is clear. The bony thorax is intact.  IMPRESSION: Right lower lobe infiltrate and effusion.   Electronically Signed   By: Kalman Jewels M.D.   On: 02/14/2014 16:55    EKG Interpretation   None       MDM   Final diagnoses:  None   63 yo F hx of HTN, HLD, DMII, COPD, paroxysmal afib s/p DCCV 01/06/14 on chronic anticoagulation, presents from PCP's office with CC syncope, atrial fib w/ RVR.  Filed Vitals:   02/14/14 2244  BP: 118/90  Pulse: 98  Temp:   Resp: 20   Physical exam as above. EKG shows Afib w/ RVR rate of 140's, without ischemic changes.  Pt normotensive.  Pt asymptomatic currently.  Pt give cardizem bolus 10 mg, followed by gtt, with improvement in HR to low 100's.    Neuro exam unremarkable as detailed above.  CT head ordered given syncopal episodes, to r/o TIA/CVA, which ultimately did not show any acute findings.  CXR showed R pleural effusion, infiltrate.  Labs significant for mild hypokalemia, hypochloremia, elevated BNP 6018, troponin 0.03.  INR 2.45 consistent with pt's anticoagulation.    Pt to be admitted to step-down unit under hospitalist.  Cardiology consulted, and will see pt as inpatient.  Pt understands and agrees with plan.  I discussed pt's care with Dr. Reather Converse.  Sinda Du, MD    Sinda Du, MD 02/14/14 [phone]

## 2014-02-14 NOTE — ED Notes (Addendum)
Gave pt [place] sandwich and ice chips

## 2014-02-14 NOTE — ED Notes (Signed)
Pt reportedly has been falling a lot and passing out and had some confusion. She went to MD office today and EMS was called because pt in Afib rate 140 (with hx of Afib). Pt wears 2L [place] at home. EMS reports that pt is A&Ox4. BP 118/72. Pt reports no pain. Bruising noted on lower back and tear on  Left elbow.

## 2014-02-14 NOTE — H&P (Addendum)
History and Physical  Colleen [name] [id number] DOB: [birth year] DOA: 02/14/2014  Referring physician: EDP PCP: [name], NP  Outpatient Specialists:  1. Cardiology: Dr. Kirk Ruths  Chief Complaint: Cough and passing out  HPI: Colleen [name] is a 63 y.o. female with history of A. fib, on Flecanide and Coumadin anticoagulation, awaiting DCCV, hypertension, DM 2, ongoing tobacco abuse, COPD, chronic respiratory failure on home oxygen 2 L per minute, GERD, hyperlipidemia hypothyroidism, CVA/TIA, depression presents to the ED with complaints of cough, passing out spells and altered mental status. History obtained from patient and discussion with EDP. No family at bedside currently. Patient complains of nonproductive cough but denies dyspnea, chest pain, palpitations, fever or chills. She states that she is passed out 3 times over the last week and the last time was on Sunday. She cannot recollect events around the episode of passing out but did sustain fall and sustained a bruise on her left elbow. She denies head injury. As per EDP discussion with family, patient also had altered mental status. In the ED, patient found to be in A. fib with RVR, potassium 3.4, hemoglobin 11.2, proBNP 6018, CT head without acute findings and chest x-ray suggests right lower lobe infiltrate and effusion. INR is therapeutic. Hospitalist admission requested.   Review of Systems: All systems reviewed and apart from history of presenting illness, are negative.  Past Medical History  Diagnosis Date  . Hypertension   . Hiatal hernia   . Adenomatous polyp of colon   . Acute asthmatic bronchitis   . GERD (gastroesophageal reflux disease)     EGD neg 03/07/2008, but prev required dilation  . COPD (chronic obstructive pulmonary disease)   . IBS (irritable bowel syndrome)     chronic  . Urinary incontinence   . Esophagitis   . Chronic gastritis   . Duodenitis without hemorrhage 10/01/2001  . Diverticulosis     . Personal history of colonic adenomas 02/05/2008        . Anemia   . Hyperlipidemia   . Decreased peripheral vision of right eye   . Pneumonia 2011  . Hyperthyroidism   . Type II diabetes mellitus   . Stroke     a. Hx of stroke and ministrokes ~ 2009. b. TIA 11/2013, dx with AF at that time.  . Arthritis   . Depression   . On home oxygen therapy     "2L; suppose to use 24/7" (01/05/2014)  . PAF (paroxysmal atrial fibrillation)     a. Prior hx. b. Recurred 11/2013 in setting of possible TIA. Lifelong Coumadin planned. Spont converted to NSR. EF 60-65% by echo 11/2013, normal cors 2007, normal nuc 2011.   . Atrial flutter     a. Dx 12/2013. s/p DCCV. On anticoagulation.   Past Surgical History  Procedure Laterality Date  . Colonoscopy    . Upper gi endoscopy    . Cholecystectomy  1980's  . Vaginal hysterectomy  1992  . Breast biopsy Left 1987    "milk gland"  . Cardioversion N/A 01/06/2014    Procedure: CARDIOVERSION;  Surgeon: Lelon Perla, MD;  Location: [facility] OR;  Service: Cardiovascular;  Laterality: N/A;   Social History:  reports that she has been smoking Cigarettes.  She has a 51 pack-year smoking history. She has never used smokeless tobacco. She reports that she does not drink alcohol or use illicit drugs. Separated. At home independent of activities of daily living. While she goes out, she uses  a cane. Continues to smoke one pack of cigarettes per day.  Allergies  Allergen Reactions  . Crestor [Rosuvastatin]   . Latex Hives  . Wellbutrin [Bupropion]     Family History  Problem Relation Age of Onset  . Heart disease Mother   . Asthma Father   . Lung cancer Father   . Asthma Sister   . Heart disease Sister   . Colon cancer Father     Prior to Admission medications   Medication Sig Start Date End Date Taking? Authorizing Provider  albuterol (PROVENTIL) (2.5 MG/3ML) 0.083% nebulizer solution Take 2.5 mg by nebulization 2 (two) times daily.    Yes Historical  Provider, MD  ALPRAZolam Duanne Moron) 0.5 MG tablet Take 0.5 mg by mouth at bedtime.    Yes Historical Provider, MD  atorvastatin (LIPITOR) 20 MG tablet Take 1 tablet (20 mg total) by mouth every evening. 01/06/14  Yes Dayna N Dunn, PA-C  cetirizine (ZYRTEC) 10 MG tablet Take 10 mg by mouth at bedtime.    Yes Historical Provider, MD  diltiazem (CARDIZEM CD) 240 MG 24 hr capsule Take 1 capsule (240 mg total) by mouth daily. 01/06/14  Yes Dayna N Dunn, PA-C  DULoxetine (CYMBALTA) 60 MG capsule Take 120 mg by mouth daily.   Yes Historical Provider, MD  ferrous sulfate 325 (65 FE) MG tablet Take 325 mg by mouth 2 (two) times daily with a meal.   Yes Historical Provider, MD  flecainide (TAMBOCOR) 100 MG tablet Take 1 tablet (100 mg total) by mouth every 12 (twelve) hours. 01/17/14  Yes Rogelia Mire, NP  Fluticasone-Salmeterol (ADVAIR DISKUS) 250-50 MCG/DOSE AEPB Inhale 1 puff into the lungs 2 (two) times daily.    Yes Historical Provider, MD  furosemide (LASIX) 40 MG tablet Take 40 mg by mouth daily.    Yes Historical Provider, MD  HYDROcodone-acetaminophen (NORCO) 7.5-325 MG per tablet Take 1 tablet by mouth every 6 (six) hours as needed for moderate pain.   Yes Historical Provider, MD  levalbuterol [facility] HFA) 45 MCG/ACT inhaler Inhale 2 puffs into the lungs every 4 (four) hours as needed for wheezing or shortness of breath.   Yes Historical Provider, MD  metFORMIN (GLUMETZA) 1000 MG (MOD) 24 hr tablet Take 1,000 mg by mouth 2 (two) times daily with a meal.   Yes Historical Provider, MD  methimazole (TAPAZOLE) 5 MG tablet Take 5 mg by mouth daily.   Yes Historical Provider, MD  metoprolol succinate (TOPROL-XL) 25 MG 24 hr tablet Take 25 mg by mouth daily. 2 daily   Yes Historical Provider, MD  omeprazole (PRILOSEC) 40 MG capsule Take 40 mg by mouth 2 (two) times daily.    Yes Historical Provider, MD  solifenacin (VESICARE) 10 MG tablet Take 10 mg by mouth daily.   Yes Historical Provider, MD  tiotropium  (SPIRIVA) 18 MCG inhalation capsule Place 18 mcg into inhaler and inhale daily.    Yes Historical Provider, MD  trimethoprim (TRIMPEX) 100 MG tablet Take 100 mg by mouth at bedtime.   Yes Historical Provider, MD  warfarin (COUMADIN) 3 MG tablet Take 1 tablet (3 mg total) by mouth See admin instructions. No Coumadin today (01/06/14). Tomorrow, start 3mg  every day except Wednesdays. On Wednesdays, take 4mg . 01/06/14  Yes Dayna N Dunn, PA-C  warfarin (COUMADIN) 4 MG tablet Take 1 tablet (4 mg total) by mouth See admin instructions. No Coumadin today (01/06/14). Tomorrow, start 3mg  every day except Wednesdays. On Wednesdays, take 4mg . 01/06/14  Yes  Charlie Pitter, PA-C   Physical Exam: Filed Vitals:   02/14/14 2025 02/14/14 2030 02/14/14 2100 02/14/14 2130  BP: 113/76 124/93 116/72 116/72  Pulse: 95 116 111 115  Temp:      TempSrc:      Resp: 20 20 29 31   Height:      Weight:      SpO2: 92% 90% 90% 90%     General exam: Moderately built and nourished middle-aged female patient who looks older than stated age, lying comfortably supine on the gurney in no obvious distress.  Head, eyes and ENT: Nontraumatic and normocephalic. Pupils equally reacting to light and accommodation. Oral mucosa moist.  Neck: Supple. No JVD, carotid bruit or thyromegaly.  Lymphatics: No lymphadenopathy.  Respiratory system: Clear to auscultation. No increased work of breathing.  Cardiovascular system: S1 and S2 heard, tachycardia and irregularly irregular. No JVD, murmurs, gallops, clicks or pedal edema. Telemetry shows A. fib with ventricular rates in the 100s to 110s.  Gastrointestinal system: Abdomen is nondistended, soft and nontender. Normal bowel sounds heard. No organomegaly or masses appreciated.  Central nervous system: Alert and oriented x4. No focal neurological deficits.  Extremities: Symmetric 5 x 5 power. Peripheral pulses symmetrically felt. Small bruise over left elbow but without any other acute  findings.  Skin: No rashes or acute findings.  Musculoskeletal system: Negative exam.  Psychiatry: Pleasant and cooperative.   Labs on Admission:  Basic Metabolic Panel:  Recent Labs Lab 02/09/14 0827 02/14/14 1729  NA 141 138  K 2.8* 3.4*  CL 93* 94*  CO2 32 30  GLUCOSE 137* 115*  BUN 11 18  CREATININE 0.85 0.88  CALCIUM 8.5 9.1  MG  --  1.7   Liver Function Tests: No results found for this basename: AST, ALT, ALKPHOS, BILITOT, PROT, ALBUMIN,  in the last 168 hours No results found for this basename: LIPASE, AMYLASE,  in the last 168 hours No results found for this basename: AMMONIA,  in the last 168 hours CBC:  Recent Labs Lab 02/14/14 1729  WBC 7.6  NEUTROABS 4.9  HGB 11.2*  HCT 39.5  MCV 75.1*  PLT 276   Cardiac Enzymes: No results found for this basename: CKTOTAL, CKMB, CKMBINDEX, TROPONINI,  in the last 168 hours  BNP (last 3 results)  Recent Labs  02/14/14 1729  PROBNP 6018.0*   CBG:  Recent Labs Lab 02/09/14 0937  GLUCAP 113*    Radiological Exams on Admission: Ct Head Wo Contrast  02/14/2014   CLINICAL DATA:  Fall, atrial fibrillation  EXAM: CT HEAD WITHOUT CONTRAST  TECHNIQUE: Contiguous axial images were obtained from the base of the skull through the vertex without intravenous contrast.  COMPARISON:  12/07/2013  FINDINGS: No skull fracture is noted. There is mucosal thickening right maxillary sinus. No intracranial hemorrhage, mass effect or midline shift. No acute cortical infarction. No mass lesion is noted on this unenhanced scan. Stable encephalomalacia from prior infarct in left occipital lobe. Mild cerebral atrophy again noted.  IMPRESSION: No acute intracranial abnormality. 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 Mild cerebral atrophy again noted.   Electronically Signed   By: Lahoma Crocker M.D.   On: 02/14/2014 17:39   Dg Chest Portable 1 View  02/14/2014   CLINICAL DATA:  Weakness.  EXAM: PORTABLE  CHEST - 1 VIEW  COMPARISON:  12/07/2013  FINDINGS: The heart is upper limits of normal in size. There is mild tortuosity and calcification of the thoracic  aorta. There is a right lower lobe infiltrate and effusion. The left lung is clear. The bony thorax is intact.  IMPRESSION: Right lower lobe infiltrate and effusion.   Electronically Signed   By: Kalman Jewels M.D.   On: 02/14/2014 16:55    EKG: Independently reviewed. A. fib with RVR at 140 per minute. No acute changes.  Assessment/Plan Principal Problem:   Atrial fibrillation with RVR Active Problems:   HYPERLIPIDEMIA   TOBACCO ABUSE   HYPERTENSION, BENIGN   COPD   GASTROESOPHAGEAL REFLUX DISEASE   Diabetes mellitus   Chronic anticoagulation- Coumadin   HCAP (healthcare-associated pneumonia)   Chronic respiratory failure with hypoxia   Syncope   1. Atrial fibrillation/flutter with RVR: Admit to step down unit for close monitoring and management. Cardiology consultation appreciated-recommend continuing home dose of metoprolol and flecanide. Continue IV Cardizem infusion that was started in the ED. Continue Coumadin per pharmacy. DCCV has been put off several times either because she wished to defer or more recently secondary to hypokalemia. As per cardiology, n.p.o. after midnight for possible DCCV in am. [id number] = 4-5 (diastolic dysfxn w/o prior chf, htn, cva/tia, female). If DCCV is successful and she remains on flecainide going forward, she will require stress testing. She will not likely be able to walk on a treadmill. 2. Healthcare associated pneumonia: Continue IV vancomycin and cefepime per pharmacy. Will need followup chest x-ray to ensure resolution of pneumonia findings. 3. Syncope: DD-tachycardia/bradycardia arrhythmias, orthostatic hypotension versus other etiologies. Monitor on telemetry. Completed a recent 2-D echo (LVEF 60-65% and grade 2 diastolic dysfunction) and carotid Dopplers (1-39% stenosis right CCA and left  ICA) in December 2014. As per cardiology, may need EPS consultation 4. Hypokalemia: Replete and follow BMP. 5. Altered mental status: Currently seems coherent.? Secondary to pneumonia, hypoxia, postictal state. Monitor closely. CT head negative. 6. Chronic hypoxic respiratory failure/ongoing tobacco abuse/COPD: Cessation counseled. Patient declines the cooking patch. Continue oxygen and bronchodilator nebulizations. 7. Type II DM: Hold metformin. SSI. 8. Hypertension: Controlled. 9. Microcytic anemia: Follow CBC    Code Status: Full  Family Communication: None at bedside  Disposition Plan: Home when medically stable.   Time spent: 60 minutes.  Vernell Leep, MD, FACP, FHM. [facility] Pager [phone]  If 7PM-7AM, please contact night-coverage [email or web address] Password [facility] 02/14/2014, 9:44 PM

## 2014-02-15 ENCOUNTER — Inpatient Hospital Stay (HOSPITAL_COMMUNITY): Payer: Medicaid Other

## 2014-02-15 ENCOUNTER — Other Ambulatory Visit: Payer: Self-pay

## 2014-02-15 DIAGNOSIS — R079 Chest pain, unspecified: Secondary | ICD-10-CM

## 2014-02-15 DIAGNOSIS — E119 Type 2 diabetes mellitus without complications: Secondary | ICD-10-CM

## 2014-02-15 LAB — CBC
HCT: 35 % — ABNORMAL LOW (ref 36.0–46.0)
HEMOGLOBIN: 10 g/dL — AB (ref 12.0–15.0)
MCH: 21.4 pg — ABNORMAL LOW (ref 26.0–34.0)
MCHC: 28.6 g/dL — ABNORMAL LOW (ref 30.0–36.0)
MCV: 74.8 fL — ABNORMAL LOW (ref 78.0–100.0)
Platelets: 215 10*3/uL (ref 150–400)
RBC: 4.68 MIL/uL (ref 3.87–5.11)
RDW: 20 % — ABNORMAL HIGH (ref 11.5–15.5)
WBC: 9 10*3/uL (ref 4.0–10.5)

## 2014-02-15 LAB — GLUCOSE, CAPILLARY
GLUCOSE-CAPILLARY: 105 mg/dL — AB (ref 70–99)
Glucose-Capillary: 124 mg/dL — ABNORMAL HIGH (ref 70–99)
Glucose-Capillary: 161 mg/dL — ABNORMAL HIGH (ref 70–99)
Glucose-Capillary: 85 mg/dL (ref 70–99)
Glucose-Capillary: 175 mg/dL — ABNORMAL HIGH (ref 70–99)

## 2014-02-15 LAB — BASIC METABOLIC PANEL
BUN: 13 mg/dL (ref 6–23)
CHLORIDE: 95 meq/L — AB (ref 96–112)
CO2: 29 mEq/L (ref 19–32)
Calcium: 8.7 mg/dL (ref 8.4–10.5)
Creatinine, Ser: 0.76 mg/dL (ref 0.50–1.10)
GFR calc non Af Amer: 88 mL/min — ABNORMAL LOW (ref >90)
Glucose, Bld: 132 mg/dL — ABNORMAL HIGH (ref 70–99)
POTASSIUM: 3.7 meq/L (ref 3.7–5.3)
Sodium: 136 mEq/L — ABNORMAL LOW (ref 137–147)

## 2014-02-15 LAB — MRSA PCR SCREENING: MRSA BY PCR: NEGATIVE

## 2014-02-15 LAB — TROPONIN I

## 2014-02-15 LAB — PROTIME-INR
INR: 2.17 — ABNORMAL HIGH (ref 0.00–1.49)
Prothrombin Time: 23.5 seconds — ABNORMAL HIGH (ref 11.6–15.2)

## 2014-02-15 LAB — STREP PNEUMONIAE URINARY ANTIGEN: Strep Pneumo Urinary Antigen: NEGATIVE

## 2014-02-15 MED ORDER — TIOTROPIUM BROMIDE MONOHYDRATE 18 MCG IN CAPS
18.0000 ug | ORAL_CAPSULE | Freq: Every day | RESPIRATORY_TRACT | Status: DC
  Administered 2014-02-16 – 2014-02-19 (×4): 18 ug via RESPIRATORY_TRACT
  Filled 2014-02-15 (×2): qty 5

## 2014-02-15 MED ORDER — LORATADINE 10 MG PO TABS
10.0000 mg | ORAL_TABLET | Freq: Every day | ORAL | Status: DC
  Administered 2014-02-15 – 2014-02-19 (×5): 10 mg via ORAL
  Filled 2014-02-15 (×5): qty 1

## 2014-02-15 MED ORDER — LEVALBUTEROL HCL 0.63 MG/3ML IN NEBU
0.6300 mg | INHALATION_SOLUTION | Freq: Four times a day (QID) | RESPIRATORY_TRACT | Status: DC
  Administered 2014-02-15 – 2014-02-16 (×4): 0.63 mg via RESPIRATORY_TRACT
  Filled 2014-02-15 (×7): qty 3

## 2014-02-15 MED ORDER — LEVALBUTEROL HCL 0.63 MG/3ML IN NEBU: 0.6300 mg | INHALATION_SOLUTION | Freq: Four times a day (QID) | RESPIRATORY_TRACT | Status: DC | PRN

## 2014-02-15 MED ORDER — HYDROCODONE-ACETAMINOPHEN 7.5-325 MG PO TABS: 1.0000 | ORAL_TABLET | Freq: Four times a day (QID) | ORAL | Status: DC | PRN

## 2014-02-15 MED ORDER — MOMETASONE FURO-FORMOTEROL FUM 100-5 MCG/ACT IN AERO
2.0000 | INHALATION_SPRAY | Freq: Two times a day (BID) | RESPIRATORY_TRACT | Status: DC
  Administered 2014-02-15 – 2014-02-19 (×6): 2 via RESPIRATORY_TRACT
  Filled 2014-02-15: qty 8.8

## 2014-02-15 MED ORDER — PANTOPRAZOLE SODIUM 40 MG PO TBEC
40.0000 mg | DELAYED_RELEASE_TABLET | Freq: Two times a day (BID) | ORAL | Status: DC
  Administered 2014-02-15 – 2014-02-19 (×8): 40 mg via ORAL
  Filled 2014-02-15 (×8): qty 1

## 2014-02-15 MED ORDER — ALPRAZOLAM 0.5 MG PO TABS
0.5000 mg | ORAL_TABLET | Freq: Every day | ORAL | Status: DC
  Administered 2014-02-15 – 2014-02-18 (×4): 0.5 mg via ORAL
  Filled 2014-02-15 (×4): qty 1

## 2014-02-15 MED ORDER — METOPROLOL SUCCINATE ER 50 MG PO TB24
50.0000 mg | ORAL_TABLET | Freq: Every day | ORAL | Status: DC
  Filled 2014-02-15: qty 1

## 2014-02-15 MED ORDER — TECHNETIUM TC 99M SESTAMIBI GENERIC - CARDIOLITE
10.0000 | Freq: Once | INTRAVENOUS | Status: AC | PRN
  Administered 2014-02-15: 10 via INTRAVENOUS

## 2014-02-15 MED ORDER — SODIUM CHLORIDE 0.9 % IV SOLN
INTRAVENOUS | Status: DC
  Administered 2014-02-15: 21:00:00 via INTRAVENOUS

## 2014-02-15 MED ORDER — MOMETASONE FURO-FORMOTEROL FUM 100-5 MCG/ACT IN AERO: 2.0000 | INHALATION_SPRAY | Freq: Two times a day (BID) | RESPIRATORY_TRACT | Status: DC

## 2014-02-15 MED ORDER — DILTIAZEM HCL 30 MG PO TABS
30.0000 mg | ORAL_TABLET | Freq: Three times a day (TID) | ORAL | Status: DC
  Administered 2014-02-15: 30 mg via ORAL
  Filled 2014-02-15 (×3): qty 1

## 2014-02-15 MED ORDER — DILTIAZEM HCL ER COATED BEADS 360 MG PO CP24
360.0000 mg | ORAL_CAPSULE | Freq: Every day | ORAL | Status: DC
  Administered 2014-02-16 – 2014-02-19 (×4): 360 mg via ORAL
  Filled 2014-02-15 (×5): qty 1

## 2014-02-15 MED ORDER — TRIMETHOPRIM 100 MG PO TABS
100.0000 mg | ORAL_TABLET | Freq: Every day | ORAL | Status: DC
  Administered 2014-02-15: 100 mg via ORAL
  Filled 2014-02-15 (×2): qty 1

## 2014-02-15 MED ORDER — SODIUM CHLORIDE 0.9 % IJ SOLN
3.0000 mL | Freq: Two times a day (BID) | INTRAMUSCULAR | Status: DC
  Administered 2014-02-15 – 2014-02-19 (×7): 3 mL via INTRAVENOUS

## 2014-02-15 MED ORDER — TECHNETIUM TC 99M SESTAMIBI GENERIC - CARDIOLITE
30.0000 | Freq: Once | INTRAVENOUS | Status: AC | PRN
  Administered 2014-02-15: 30 via INTRAVENOUS

## 2014-02-15 MED ORDER — WARFARIN SODIUM 3 MG PO TABS
3.0000 mg | ORAL_TABLET | Freq: Once | ORAL | Status: AC
  Administered 2014-02-15: 3 mg via ORAL
  Filled 2014-02-15 (×3): qty 1

## 2014-02-15 MED ORDER — TIOTROPIUM BROMIDE MONOHYDRATE 18 MCG IN CAPS: 18.0000 ug | ORAL_CAPSULE | Freq: Every day | RESPIRATORY_TRACT | Status: DC

## 2014-02-15 MED ORDER — ACETAMINOPHEN 650 MG RE SUPP: 650.0000 mg | Freq: Four times a day (QID) | RECTAL | Status: DC | PRN

## 2014-02-15 MED ORDER — DARIFENACIN HYDROBROMIDE ER 15 MG PO TB24
15.0000 mg | ORAL_TABLET | Freq: Every day | ORAL | Status: DC
  Administered 2014-02-15 – 2014-02-19 (×5): 15 mg via ORAL
  Filled 2014-02-15 (×5): qty 1

## 2014-02-15 MED ORDER — FERROUS SULFATE 325 (65 FE) MG PO TABS
325.0000 mg | ORAL_TABLET | Freq: Two times a day (BID) | ORAL | Status: DC
  Administered 2014-02-15 – 2014-02-19 (×7): 325 mg via ORAL
  Filled 2014-02-15 (×10): qty 1

## 2014-02-15 MED ORDER — DILTIAZEM HCL 60 MG PO TABS
60.0000 mg | ORAL_TABLET | Freq: Four times a day (QID) | ORAL | Status: DC
  Filled 2014-02-15 (×2): qty 1

## 2014-02-15 MED ORDER — ATORVASTATIN CALCIUM 20 MG PO TABS
20.0000 mg | ORAL_TABLET | Freq: Every evening | ORAL | Status: DC
Start: 2014-02-15 — End: 2014-02-19
  Administered 2014-02-15 – 2014-02-18 (×3): 20 mg via ORAL
  Filled 2014-02-15 (×6): qty 1

## 2014-02-15 MED ORDER — ONDANSETRON HCL 4 MG/2ML IJ SOLN: 4.0000 mg | Freq: Four times a day (QID) | INTRAMUSCULAR | Status: DC | PRN

## 2014-02-15 MED ORDER — METOPROLOL SUCCINATE ER 25 MG PO TB24
25.0000 mg | ORAL_TABLET | Freq: Every day | ORAL | Status: DC
  Administered 2014-02-15: 25 mg via ORAL
  Filled 2014-02-15: qty 1

## 2014-02-15 MED ORDER — DULOXETINE HCL 60 MG PO CPEP
120.0000 mg | ORAL_CAPSULE | Freq: Every day | ORAL | Status: DC
  Administered 2014-02-15 – 2014-02-19 (×5): 120 mg via ORAL
  Filled 2014-02-15 (×5): qty 2

## 2014-02-15 MED ORDER — FLECAINIDE ACETATE 100 MG PO TABS
100.0000 mg | ORAL_TABLET | Freq: Two times a day (BID) | ORAL | Status: DC
  Administered 2014-02-15: 100 mg via ORAL
  Filled 2014-02-15 (×2): qty 1

## 2014-02-15 MED ORDER — FUROSEMIDE 40 MG PO TABS
40.0000 mg | ORAL_TABLET | Freq: Every day | ORAL | Status: DC
  Administered 2014-02-15 – 2014-02-19 (×5): 40 mg via ORAL
  Filled 2014-02-15 (×6): qty 1

## 2014-02-15 MED ORDER — METHIMAZOLE 5 MG PO TABS
5.0000 mg | ORAL_TABLET | Freq: Every day | ORAL | Status: DC
  Administered 2014-02-15 – 2014-02-19 (×5): 5 mg via ORAL
  Filled 2014-02-15 (×5): qty 1

## 2014-02-15 MED ORDER — ONDANSETRON HCL 4 MG PO TABS: 4.0000 mg | ORAL_TABLET | Freq: Four times a day (QID) | ORAL | Status: DC | PRN

## 2014-02-15 MED ORDER — TECHNETIUM TC 99M SULFUR COLLOID FILTERED: 1.0000 | Freq: Once | INTRAVENOUS | Status: AC | PRN

## 2014-02-15 MED ORDER — ACETAMINOPHEN 325 MG PO TABS: 650.0000 mg | ORAL_TABLET | Freq: Four times a day (QID) | ORAL | Status: DC | PRN

## 2014-02-15 NOTE — ED Notes (Signed)
Called nuc med they will resend for pt

## 2014-02-15 NOTE — ED Notes (Signed)
nuc med came for pt  States they will come back 10 mins  as I am hanging vanc

## 2014-02-15 NOTE — ED Notes (Signed)
Patient pulled oxygen off while sleeping and sats dropped to 74%, oxygen reapplied and sats went back up to the low 90's

## 2014-02-15 NOTE — ED Notes (Signed)
To nuc med per w/c

## 2014-02-15 NOTE — ED Notes (Signed)
Pt received pt aaox4 has congested cough ono2 dozing on and off

## 2014-02-15 NOTE — Progress Notes (Signed)
Subjective: No complaints, HR controlled on dilt drip.  Objective: Vital signs in last 24 hours: Temp:  [98.5 F (36.9 C)] 98.5 F (36.9 C) (02/16 1616) Pulse Rate:  [45-136] 120 (02/17 0830) Resp:  [16-40] 25 (02/17 0830) BP: (101-137)/(60-97) 114/88 mmHg (02/17 0830) SpO2:  [87 %-97 %] 92 % (02/17 0830) Weight:  [188 lb (85.276 kg)] 188 lb (85.276 kg) (02/16 1618) Weight change:    Intake/Output from previous day:     Intake/Output this shift:    PE: General:Pleasant affect, NAD Skin:Warm and dry, brisk capillary refill HEENT:normocephalic, sclera clear, mucus membranes moist Heart:irreg irreg without murmur, gallup, rub or click Lungs: without rales, rhonchi, + wheezes CBJ:SEGB, non tender, + BS, do not palpate liver spleen or masses Ext:no lower ext edema, 2+ pedal pulses, 2+ radial pulses Neuro:alert and oriented, MAE, follows commands, + facial symmetry   Lab Results:  Recent Labs  02/14/14 1729  WBC 7.6  HGB 11.2*  HCT 39.5  PLT 276   BMET  Recent Labs  02/14/14 1729  NA 138  K 3.4*  CL 94*  CO2 30  GLUCOSE 115*  BUN 18  CREATININE 0.88  CALCIUM 9.1   No results found for this basename: TROPONINI, CK, MB,  in the last 72 hours  Lab Results  Component Value Date   CHOL 145 01/05/2014   HDL 35* 01/05/2014   LDLCALC 80 01/05/2014   TRIG 150* 01/05/2014   CHOLHDL 4.1 01/05/2014   Lab Results  Component Value Date   HGBA1C 7.0* 01/05/2014     Lab Results  Component Value Date   TSH 1.817 01/05/2014       Studies/Results: Ct Head Wo Contrast  02/14/2014   CLINICAL DATA:  Fall, atrial fibrillation  EXAM: CT HEAD WITHOUT CONTRAST  TECHNIQUE: Contiguous axial images were obtained from the base of the skull through the vertex without intravenous contrast.  COMPARISON:  12/07/2013  FINDINGS: No skull fracture is noted. There is mucosal thickening right maxillary sinus. No intracranial hemorrhage, mass effect or midline shift. No acute cortical  infarction. No mass lesion is noted on this unenhanced scan. Stable encephalomalacia from prior infarct in left occipital lobe. Mild cerebral atrophy again noted.  IMPRESSION: No acute intracranial abnormality. 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 Mild cerebral atrophy again noted.   Electronically Signed   By: Lahoma Crocker M.D.   On: 02/14/2014 17:39   Dg Chest Portable 1 View  02/14/2014   CLINICAL DATA:  Weakness.  EXAM: PORTABLE CHEST - 1 VIEW  COMPARISON:  12/07/2013  FINDINGS: The heart is upper limits of normal in size. There is mild tortuosity and calcification of the thoracic aorta. There is a right lower lobe infiltrate and effusion. The left lung is clear. The bony thorax is intact.  IMPRESSION: Right lower lobe infiltrate and effusion.   Electronically Signed   By: Kalman Jewels M.D.   On: 02/14/2014 16:55    Medications: I have reviewed the patient's current medications. Scheduled Meds: . insulin aspart  0-9 Units Subcutaneous TID WC  . Warfarin - Pharmacist Dosing Inpatient   Does not apply q1800   Continuous Infusions: . ceFEPime (MAXIPIME) IV Stopped (02/15/14 0536)  . diltiazem (CARDIZEM) infusion 15 mg/hr (02/15/14 0448)  . vancomycin 1,000 mg (02/15/14 0746)   PRN Meds:.  Assessment/Plan: Principal Problem:   Atrial fibrillation with RVR Active Problems:   HYPERLIPIDEMIA   TOBACCO ABUSE  HYPERTENSION, BENIGN   COPD   GASTROESOPHAGEAL REFLUX DISEASE   Diabetes mellitus   Chronic anticoagulation- Coumadin   HCAP (healthcare-associated pneumonia)   Chronic respiratory failure with hypoxia   Syncope  PLAN: Labs pending for today.  Lexiscan myoview completed without complication.  EP to see today.  Flecainide has been stopped.    LOS: 1 day   Time spent with pt. : 15 minutes. [facility] R  Nurse Practitioner Certified Pager [phone] or after 5pm and on weekends call [phone] 02/15/2014, 10:54 AM  I have seen and  examined the patient along with Cecilie Kicks, NP.  I have reviewed the chart, notes and new data.  I agree with NP's note.  No reversible ischemia on nuclear perfusion study, but there is a fixed defect that might correspond to a distal LAd artery scar.  PLAN: No meaningful arrhythmia other than persistent AF. Transition to oral rate control meds. Avoid class I antiarrhythmic agents. Will ask EP advice regarding need//choice of long term antiarrhythmic meds.  Sanda Klein, MD, [place] [phone] 02/15/2014, 1:45 PM

## 2014-02-15 NOTE — ED Provider Notes (Signed)
Medical screening examination/treatment/procedure(s) were conducted as a shared visit with non-physician practitioner(s) or resident  and myself.  I personally evaluated the patient during the encounter and agree with the findings and plan unless otherwise indicated.    I have personally reviewed any xrays and/ or EKG's with the provider and I agree with interpretation.   Known a fib with ablation hx, on anticoagulation.  Syncope.  No change in medicines.  No acute HA.  No cp or sob. EKG a fib with rvr.  Cardizem bolus/ drip, cardiac evaluation.  CT head to look for signs of bleeding or stroke with mild confusion.  Exam tachy, irregular, abdo soft./ NT, mild dry mm, lungs right lower rales.  Cardizem drip, admission to stepdown to medicine. CXR reviewed, right lower infiltrate/ effusion. Broad abx given.  CT head no acute findings. HCAP abx with recent hospitalization.    EKG reviewed, not in muse  Date: 02/15/2014  Rate: 140  Rhythm: atrial flutter  QRS Axis: right  Intervals: QT prolonged  ST/T Wave abnormalities: nonspecific ST changes  Conduction Disutrbances:none A flutter/ fib   A fib RVR, Confusion, Lung effusion right, Syncope, HCAP  Mariea Clonts, MD 02/15/14 [phone]

## 2014-02-15 NOTE — ED Notes (Signed)
Patient up to the [facility] to void.  Urine dark tea colored.  Pants off and her attends applied, linens changed as well as gown.  Yellow socks on, yellow arm band placed

## 2014-02-15 NOTE — Progress Notes (Addendum)
[facility] Progress Note [place] TEAM 1 - Stepdown/ICU TEAM   AVYANNA [name] [name]:[id number] DOB: [birth year] DOA: 02/14/2014 PCP: [name], NP  Brief narrative: [name] is a 63 y.o. female presenting on 02/14/2014 with  has a past medical history of Hypertension; Hiatal hernia; Adenomatous polyp of colon; Acute asthmatic bronchitis; GERD (gastroesophageal reflux disease); COPD (chronic obstructive pulmonary disease); IBS (irritable bowel syndrome); Urinary incontinence; Esophagitis; Chronic gastritis; Duodenitis without hemorrhage (10/01/2001); Diverticulosis; Personal history of colonic adenomas (02/05/2008); Anemia; Hyperlipidemia; Decreased peripheral vision of right eye; Pneumonia (2011); Hyperthyroidism; Type II diabetes mellitus; Stroke; Arthritis; Depression; On home oxygen therapy; PAF (paroxysmal atrial fibrillation); and Atrial flutter who presents with cough, passing out spells and altered mental status. Patient complains of nonproductive cough but denies dyspnea, chest pain, palpitations, fever or chills. She states that she is passed out 3 times over the last week and the last time was on Sunday. She cannot recollect events around the episode of passing out but did sustain fall and sustained a bruise on her left elbow. She denies head injury. As per EDP discussion with family, patient also had altered mental status. In the ED, patient found to be in A. fib with RVR.  Subjective: Dry cough continues. No other complaints.   Assessment/Plan: Principal Problem:   P Atrial fibrillation with RVR - failed DCCV 01/06/14 - cont Cardizem infusion - EP has evaluate- Flecainide d/c'd and Cardizem being increased- medical management only due to comorbidities  Active Problems:    Syncope - due to rapid A-fib  - CT head negative  - Myoview performed today- fixed defect noted-  initial troponin 0.03- repeat < 0.30 - please review elaborate cardiology notes - no driving for 6 mo    HCAP (healthcare-associated pneumonia) - RLL infiltrate - check urine strep and legionella -Continue IV vancomycin and cefepime per pharmacy. Will need followup chest x-ray to ensure resolution of pneumonia findings.    COPD-Chronic respiratory failure with hypoxia in current smoker - stable but requiring 3 L O2 rather than her usual 2 L     Diabetes mellitus - SSI -  Metformin on hold- had Lexiscan today    Chronic anticoagulation - Coumadin- INR therapeutic  HTN - stable  Hypokalemia - replaced  Chronic anemia - stable  Code Status: full code Family Communication: none Disposition Plan: SDU  Consultants: Cardiology  Procedures: ETT with myoview 2/17  Antibiotics: Vanc and Cefepime 2/16  DVT prophylaxis: Coumadin  Objective: Filed Weights   02/14/14 1618 02/15/14 1200  Weight: 85.276 kg (188 lb) 85.1 kg (187 lb 9.8 oz)   Blood pressure 121/78, pulse 104, temperature 98.2 F (36.8 C), temperature source Oral, resp. rate 21, height 5\' 6"  (1.676 m), weight 85.1 kg (187 lb 9.8 oz), SpO2 98.00%.  Intake/Output Summary (Last 24 hours) at 02/15/14 1655 Last data filed at 02/15/14 1500  Gross per 24 hour  Intake    100 ml  Output    300 ml  Net   -200 ml     Exam: General: No acute respiratory distress Lungs: crackles RLL Cardiovascular: Irregular rate and rhythm without murmur gallop or rub normal S1 and S2 Abdomen: Nontender, nondistended, soft, bowel sounds positive, no rebound, no ascites, no appreciable mass Extremities: No significant cyanosis, clubbing, or edema bilateral lower extremities  Data Reviewed: Basic Metabolic Panel:  Recent Labs Lab 02/09/14 0827 02/14/14 1729 02/15/14 1340  NA 141 138 136*  K 2.8* 3.4* 3.7  CL 93* 94* 95*  CO2 32 30 29  GLUCOSE 137* 115* 132*  BUN 11 18 13   CREATININE 0.85 0.88 0.76  CALCIUM 8.5 9.1 8.7  MG  --  1.7  --    Liver Function Tests: No results found for this basename: AST, ALT, ALKPHOS,  BILITOT, PROT, ALBUMIN,  in the last 168 hours No results found for this basename: LIPASE, AMYLASE,  in the last 168 hours No results found for this basename: AMMONIA,  in the last 168 hours CBC:  Recent Labs Lab 02/14/14 1729 02/15/14 1340  WBC 7.6 9.0  NEUTROABS 4.9  --   HGB 11.2* 10.0*  HCT 39.5 35.0*  MCV 75.1* 74.8*  PLT 276 215   Cardiac Enzymes:  Recent Labs Lab 02/15/14 1340  TROPONINI <0.30   BNP (last 3 results)  Recent Labs  02/14/14 1729  PROBNP 6018.0*   CBG:  Recent Labs Lab 02/09/14 0937 02/15/14 0225 02/15/14 0751 02/15/14 1217  GLUCAP 113* 105* 124* 161*    Recent Results (from the past 240 hour(s))  MRSA PCR SCREENING     Status: None   Collection Time    02/15/14  1:39 PM      Result Value Ref Range Status   MRSA by PCR NEGATIVE  NEGATIVE Final   Comment:            The GeneXpert MRSA Assay (FDA     approved for NASAL specimens     only), is one component of a     comprehensive MRSA colonization     surveillance program. It is not     intended to diagnose MRSA     infection nor to guide or     monitor treatment for     MRSA infections.     Studies:  Recent x-ray studies have been reviewed in detail by the Attending Physician  Scheduled Meds:  Scheduled Meds: . ALPRAZolam  0.5 mg Oral QHS  . atorvastatin  20 mg Oral QPM  . ceFEPime (MAXIPIME) IV  1 g Intravenous Q8H  . darifenacin  15 mg Oral Daily  . [START ON 02/16/2014] diltiazem  60 mg Oral 4 times per day  . DULoxetine  120 mg Oral Daily  . ferrous sulfate  325 mg Oral BID WC  . furosemide  40 mg Oral Daily  . insulin aspart  0-9 Units Subcutaneous TID WC  . levalbuterol  0.63 mg Nebulization Q6H  . loratadine  10 mg Oral Daily  . methimazole  5 mg Oral Daily  . [START ON 02/16/2014] metoprolol succinate  50 mg Oral Daily  . mometasone-formoterol  2 puff Inhalation BID  . pantoprazole  40 mg Oral BID  . sodium chloride  3 mL Intravenous Q12H  . [START ON  02/16/2014] tiotropium  18 mcg Inhalation Daily  . trimethoprim  100 mg Oral QHS  . vancomycin  1,000 mg Intravenous Q12H  . warfarin  3 mg Oral ONCE-1800  . Warfarin - Pharmacist Dosing Inpatient   Does not apply q1800   Continuous Infusions: . sodium chloride      Time spent on care of this patient: >35 min   Debbe Odea, MD  [facility] Office  [phone] Pager - Text Page per Shea Evans as per below:  On-Call/Text Page:      [email or web address]      password [email or web address]  If 7PM-7AM, please contact night-coverage [email or web address] Password [email or web address] 02/15/2014, 4:55 PM   LOS: 1 day

## 2014-02-15 NOTE — ED Notes (Signed)
Report called to floor

## 2014-02-15 NOTE — Progress Notes (Signed)
ANTICOAGULATION CONSULT NOTE - Follow Up Consult  Pharmacy Consult for Coumadin Indication: atrial fibrillation  Allergies  Allergen Reactions  . Crestor [Rosuvastatin]   . Latex Hives  . Wellbutrin [Bupropion]     Patient Measurements: Height: 5\' 5"  (165.1 cm) Weight: 188 lb (85.276 kg) IBW/kg (Calculated) : 57  Vital Signs: BP: 130/80 mmHg (02/17 1049) Pulse Rate: 120 (02/17 0830)  Labs:  Recent Labs  02/14/14 1729  HGB 11.2*  HCT 39.5  PLT 276  LABPROT 25.8*  INR 2.45*  CREATININE 0.88    Estimated Creatinine Clearance: 70.6 ml/min (by C-G formula based on Cr of 0.88).  Assessment: 63 year old female continuing home Coumadin for Afib. INR on admission = 2.45  Goal of Therapy:  INR 2-3 Monitor platelets by anticoagulation protocol: Yes   Plan:  1) Coumadin 3 mg po x 1 dose tonight 2) Follow up AM INR  Thank you. Anette Guarneri, PharmD [phone]  02/15/2014,11:21 AM

## 2014-02-15 NOTE — Consult Note (Signed)
ELECTROPHYSIOLOGY CONSULT NOTE    Patient ID: Colleen [name] MRN: [id number], DOB/AGE: [birth year] 63 y.o.  Admit date: 02/14/2014 Date of Consult: 02-15-14  Primary Physician: [name], NP Primary Cardiologist: Stanford Breed  Reason for Consultation: atrial arrhythmias  HPI:  Colleen [name] is a 63 y.o. female with a past medical history of hypertension, COPD, diabetes, hyperlipidemia, prior CVA, and atrial fibrillation/flutter.  She was first diagnosed with atrial fibrillation in December 2014.  Since December, she has had increasing episodes of atrial fibrillation. In December of 2014, Cardizem was added to Metoprolol; Jan 04 2014 she underwent DCCV and Flecainide was added; 01-17-14 she was seen back in the office and found to be in recurrent afib with RVR that was asymptomatic with plans for cardioversion; she ultimately had cardioversion put off several times.  On 2-13, she was scheduled for cardioversion but this was not done 2/2 hypokalemia.  On that day, she had 2 episodes of syncope while walking.  She has since had 2 more syncopal spells over the weekend.  She was seen by her PCP to evaluate syncopal spells on the day of admission who advised evaluation at the hospital.  All of her syncopal spells were without prodrome and lasted only seconds.  She is otherwise totally asymptomatic with her atrial fibrillation.  She reports daytime somnolence, has never been evaluated for sleep apnea.   She has been admitted, placed on IV Cardizem for rate control. She underwent myoview today which demonstrated EF 51%, fixed defect in the apex and anteroseptal region associated with septal hypokinesis.  Labwork is notable for therapeutic INR, initial troponin negative, K 3.4, Hgb 11.2.   Last echo 11-2013 demonstrated EF 60-65%, no RWMA, grade 2 diastolic dysfunction, LA 38.    EP has been asked to evaluate for treatment options.   Past Medical History  Diagnosis Date  . Hypertension   . Hiatal hernia    . Adenomatous polyp of colon   . Acute asthmatic bronchitis   . GERD (gastroesophageal reflux disease)     EGD neg 03/07/2008, but prev required dilation  . COPD (chronic obstructive pulmonary disease)   . IBS (irritable bowel syndrome)     chronic  . Urinary incontinence   . Esophagitis   . Chronic gastritis   . Duodenitis without hemorrhage 10/01/2001  . Diverticulosis   . Personal history of colonic adenomas 02/05/2008        . Anemia   . Hyperlipidemia   . Decreased peripheral vision of right eye   . Pneumonia 2011  . Hyperthyroidism   . Type II diabetes mellitus   . Stroke     a. Hx of stroke and ministrokes ~ 2009. b. TIA 11/2013, dx with AF at that time.  . Arthritis   . Depression   . On home oxygen therapy     "2L; suppose to use 24/7" (01/05/2014)  . PAF (paroxysmal atrial fibrillation)     a. Prior hx. b. Recurred 11/2013 in setting of possible TIA. Lifelong Coumadin planned. Spont converted to NSR. EF 60-65% by echo 11/2013, normal cors 2007, normal nuc 2011.   . Atrial flutter     a. Dx 12/2013. s/p DCCV. On anticoagulation.     Surgical History:  Past Surgical History  Procedure Laterality Date  . Colonoscopy    . Upper gi endoscopy    . Cholecystectomy  1980's  . Vaginal hysterectomy  1992  . Breast biopsy Left 1987    "milk gland"  .  Cardioversion N/A 01/06/2014    Procedure: CARDIOVERSION;  Surgeon: Lelon Perla, MD;  Location: [facility] OR;  Service: Cardiovascular;  Laterality: N/A;     Prescriptions prior to admission  Medication Sig Dispense Refill  . albuterol (PROVENTIL) (2.5 MG/3ML) 0.083% nebulizer solution Take 2.5 mg by nebulization 2 (two) times daily.       [name] ALPRAZolam (XANAX) 0.5 MG tablet Take 0.5 mg by mouth at bedtime.       [name] atorvastatin (LIPITOR) 20 MG tablet Take 1 tablet (20 mg total) by mouth every evening.  30 tablet  6  . cetirizine (ZYRTEC) 10 MG tablet Take 10 mg by mouth at bedtime.       [name] diltiazem (CARDIZEM CD) 240 MG 24 hr  capsule Take 1 capsule (240 mg total) by mouth daily.  30 capsule  6  . DULoxetine (CYMBALTA) 60 MG capsule Take 120 mg by mouth daily.      . ferrous sulfate 325 (65 FE) MG tablet Take 325 mg by mouth 2 (two) times daily with a meal.      . flecainide (TAMBOCOR) 100 MG tablet Take 1 tablet (100 mg total) by mouth every 12 (twelve) hours.  60 tablet  6  . Fluticasone-Salmeterol (ADVAIR DISKUS) 250-50 MCG/DOSE AEPB Inhale 1 puff into the lungs 2 (two) times daily.       . furosemide (LASIX) 40 MG tablet Take 40 mg by mouth daily.       [name] HYDROcodone-acetaminophen (NORCO) 7.5-325 MG per tablet Take 1 tablet by mouth every 6 (six) hours as needed for moderate pain.      [name] levalbuterol (XOPENEX HFA) 45 MCG/ACT inhaler Inhale 2 puffs into the lungs every 4 (four) hours as needed for wheezing or shortness of breath.      . metFORMIN (GLUMETZA) 1000 MG (MOD) 24 hr tablet Take 1,000 mg by mouth 2 (two) times daily with a meal.      . methimazole (TAPAZOLE) 5 MG tablet Take 5 mg by mouth daily.      . metoprolol succinate (TOPROL-XL) 25 MG 24 hr tablet Take 25 mg by mouth daily. 2 daily      . omeprazole (PRILOSEC) 40 MG capsule Take 40 mg by mouth 2 (two) times daily.       . solifenacin (VESICARE) 10 MG tablet Take 10 mg by mouth daily.      [name] tiotropium (SPIRIVA) 18 MCG inhalation capsule Place 18 mcg into inhaler and inhale daily.       [name] trimethoprim (TRIMPEX) 100 MG tablet Take 100 mg by mouth at bedtime.      [name] warfarin (COUMADIN) 3 MG tablet Take 1 tablet (3 mg total) by mouth See admin instructions. No Coumadin today (01/06/14). Tomorrow, start 3mg  every day except Wednesdays. On Wednesdays, take 4mg .      . warfarin (COUMADIN) 4 MG tablet Take 1 tablet (4 mg total) by mouth See admin instructions. No Coumadin today (01/06/14). Tomorrow, start 3mg  every day except Wednesdays. On Wednesdays, take 4mg .        Inpatient Medications:  . ALPRAZolam  0.5 mg Oral QHS  . atorvastatin  20 mg Oral QPM  .  ceFEPime (MAXIPIME) IV  1 g Intravenous Q8H  . darifenacin  15 mg Oral Daily  . DULoxetine  120 mg Oral Daily  . ferrous sulfate  325 mg Oral BID WC  . flecainide  100 mg Oral Q12H  . furosemide  40 mg Oral Daily  .  insulin aspart  0-9 Units Subcutaneous TID WC  . levalbuterol  0.63 mg Nebulization Q6H  . loratadine  10 mg Oral Daily  . methimazole  5 mg Oral Daily  . metoprolol succinate  25 mg Oral Daily  . mometasone-formoterol  2 puff Inhalation BID  . pantoprazole  40 mg Oral BID  . sodium chloride  3 mL Intravenous Q12H  . [START ON 02/16/2014] tiotropium  18 mcg Inhalation Daily  . trimethoprim  100 mg Oral QHS  . vancomycin  1,000 mg Intravenous Q12H  . warfarin  3 mg Oral ONCE-1800  . Warfarin - Pharmacist Dosing Inpatient   Does not apply q1800    Allergies:  Allergies  Allergen Reactions  . Crestor [Rosuvastatin]   . Latex Hives  . Wellbutrin [Bupropion]     History   Social History  . Marital Status: Married    Spouse Name: N/A    Number of Children: N/A  . Years of Education: N/A   Occupational History  . Not on file.   Social History Main Topics  . Smoking status: Current Every Day Smoker -- 1.00 packs/day for 51 years    Types: Cigarettes  . Smokeless tobacco: Never Used  . Alcohol Use: No  . Drug Use: No  . Sexual Activity: No   Other Topics Concern  . Not on file   Social History Narrative  . No narrative on file     Family History  Problem Relation Age of Onset  . Heart disease Mother   . Asthma Father   . Lung cancer Father   . Asthma Sister   . Heart disease Sister   . Colon cancer Father     Physical Exam: Filed Vitals:   02/15/14 1315 02/15/14 1400 02/15/14 1500 02/15/14 1521  BP: 126/89 133/98 121/78 121/78  Pulse: 55 65 113 104  Temp:    98.2 F (36.8 C)  TempSrc:    Oral  Resp: 25 21 27 21   Height:      Weight:      SpO2: 91% 94% 86% 98%    GEN- The patient is chronically ill appearing, sleeping but rouses and  oriented x 3 today.   Head- normocephalic, atraumatic Eyes-  Sclera clear, conjunctiva pink Ears- hearing intact Oropharynx- clear Neck- supple  Lungs- prolonged expiratory phase, normal work of breathing Heart- irregular rate and rhythm  GI- soft, NT, ND, + BS Extremities- no clubbing, cyanosis, or edema MS- no significant deformity or atrophy Skin- no rash or lesion Psych- euthymic mood, full affect Neuro- strength and sensation are intact  Labs:   Lab Results  Component Value Date   WBC 7.6 02/14/2014   HGB 11.2* 02/14/2014   HCT 39.5 02/14/2014   MCV 75.1* 02/14/2014   PLT 276 02/14/2014    Recent Labs Lab 02/14/14 1729  NA 138  K 3.4*  CL 94*  CO2 30  BUN 18  CREATININE 0.88  CALCIUM 9.1  GLUCOSE 115*   Lab Results  Component Value Date   TROPONINI <0.30 12/08/2013   Lab Results  Component Value Date   CHOL 145 01/05/2014   CHOL 147 12/08/2013   Lab Results  Component Value Date   HDL 35* 01/05/2014   HDL 42 12/08/2013   Lab Results  Component Value Date   LDLCALC 80 01/05/2014   LDLCALC 79 12/08/2013   Lab Results  Component Value Date   TRIG 150* 01/05/2014   TRIG 128 12/08/2013   Lab  Results  Component Value Date   CHOLHDL 4.1 01/05/2014   CHOLHDL 3.5 12/08/2013     Radiology/Studies: Ct Head Wo Contrast 02/14/2014   CLINICAL DATA:  Fall, atrial fibrillation  EXAM: CT HEAD WITHOUT CONTRAST  TECHNIQUE: Contiguous axial images were obtained from the base of the skull through the vertex without intravenous contrast.  COMPARISON:  12/07/2013  FINDINGS: No skull fracture is noted. There is mucosal thickening right maxillary sinus. No intracranial hemorrhage, mass effect or midline shift. No acute cortical infarction. No mass lesion is noted on this unenhanced scan. Stable encephalomalacia from prior infarct in left occipital lobe. Mild cerebral atrophy again noted.  IMPRESSION: No acute intracranial abnormality. Stable old infarct and mild encephalomalacia in  left occipital lobe medially. Mild cerebral atrophy again noted.   Electronically Signed   By: Lahoma Crocker M.D.   On: 02/14/2014 17:39   Nm Myocar Multi W/spect W/wall Motion / Ef 02/15/2014   CLINICAL DATA:  Chest pain  EXAM: MYOCARDIAL IMAGING WITH SPECT (REST AND PHARMACOLOGIC-STRESS)  GATED LEFT VENTRICULAR WALL MOTION STUDY  LEFT VENTRICULAR EJECTION FRACTION  TECHNIQUE: Standard myocardial SPECT imaging was performed after resting intravenous injection of 10 mCi Tc-38m sestamibi. Subsequently, intravenous infusion of Lexiscan was performed under the supervision of the Cardiology staff. At peak effect of the drug, 30 mCi Tc-44m sestamibi was injected intravenously and standard myocardial SPECT imaging was performed. Quantitative gated imaging was also performed to evaluate left ventricular wall motion, and estimate left ventricular ejection fraction.  COMPARISON:  None.  FINDINGS: SPECT: No stress-induced perfusion defect. There is a fixed defect involving the apex extending into the anteroseptal region.  Wall motion:  Septal hypokinesis.  Ejection fraction: 51%. End-diastolic volume 91 cc. End systolic volume 44 cc.  IMPRESSION: No evidence of stress induced ischemia.  Fixed defect in the apex and anteroseptal region associated with septal hypokinesis.   Electronically Signed   By: Maryclare Bean M.D.   On: 02/15/2014 12:20   Dg Chest Portable 1 View 02/14/2014   CLINICAL DATA:  Weakness.  EXAM: PORTABLE CHEST - 1 VIEW  COMPARISON:  12/07/2013  FINDINGS: The heart is upper limits of normal in size. There is mild tortuosity and calcification of the thoracic aorta. There is a right lower lobe infiltrate and effusion. The left lung is clear. The bony thorax is intact.  IMPRESSION: Right lower lobe infiltrate and effusion.   Electronically Signed   By: Kalman Jewels M.D.   On: 02/14/2014 16:55    EKG: afib, V rate 140 bpm  TELEMETRY: atrial fibrillation, ventricular rates around 100  A/P  1. Persistent  afib The patient has symptomatic afib with elevated V rates.  She does not appear to be very symptomatic with her afib.  Given her comorbidities and absence of symptoms I would favor rate control rather than rhythm control as our primary strategy.   Stop flecainide Increase diltiazem to 360mg  daily Goal heart rates < 100 bpm  2. Syncope Unclear etiology Possibly arrhythmogenic in etiology.  Given this, I would favor stopping flecainide at this time. Rate control afib as above No driving x 6 months  3. Chronic lung disease Per primary team Smoking cessation is necessary long term  No further inpatient EP workup planned General cardiology to manage going forward. Call with questions.

## 2014-02-16 LAB — BLOOD GAS, ARTERIAL
Acid-Base Excess: 3.5 mmol/L — ABNORMAL HIGH (ref 0.0–2.0)
Bicarbonate: 27.4 mEq/L — ABNORMAL HIGH (ref 20.0–24.0)
DRAWN BY: 281201
O2 CONTENT: 5 L/min
O2 SAT: 96.1 %
PATIENT TEMPERATURE: 98.6
TCO2: 28.6 mmol/L (ref 0–100)
pCO2 arterial: 40.2 mmHg (ref 35.0–45.0)
pH, Arterial: 7.448 (ref 7.350–7.450)
pO2, Arterial: 78.2 mmHg — ABNORMAL LOW (ref 80.0–100.0)

## 2014-02-16 LAB — BASIC METABOLIC PANEL
BUN: 12 mg/dL (ref 6–23)
CALCIUM: 8.9 mg/dL (ref 8.4–10.5)
CO2: 30 mEq/L (ref 19–32)
CREATININE: 0.76 mg/dL (ref 0.50–1.10)
Chloride: 96 mEq/L (ref 96–112)
GFR calc Af Amer: >90 mL/min (ref >90)
GFR calc non Af Amer: 88 mL/min — ABNORMAL LOW (ref >90)
GLUCOSE: 102 mg/dL — AB (ref 70–99)
Potassium: 3.3 mEq/L — ABNORMAL LOW (ref 3.7–5.3)
Sodium: 139 mEq/L (ref 137–147)

## 2014-02-16 LAB — GLUCOSE, CAPILLARY
GLUCOSE-CAPILLARY: 87 mg/dL (ref 70–99)
GLUCOSE-CAPILLARY: 130 mg/dL — AB (ref 70–99)
Glucose-Capillary: 147 mg/dL — ABNORMAL HIGH (ref 70–99)
Glucose-Capillary: 145 mg/dL — ABNORMAL HIGH (ref 70–99)

## 2014-02-16 LAB — TROPONIN I

## 2014-02-16 LAB — LEGIONELLA ANTIGEN, URINE: LEGIONELLA ANTIGEN, URINE: NEGATIVE

## 2014-02-16 LAB — PROTIME-INR
INR: 2.02 — ABNORMAL HIGH (ref 0.00–1.49)
PROTHROMBIN TIME: 22.2 s — AB (ref 11.6–15.2)

## 2014-02-16 MED ORDER — POTASSIUM CHLORIDE CRYS ER 20 MEQ PO TBCR
40.0000 meq | EXTENDED_RELEASE_TABLET | Freq: Once | ORAL | Status: DC
  Filled 2014-02-16: qty 2

## 2014-02-16 MED ORDER — LEVALBUTEROL HCL 0.63 MG/3ML IN NEBU: 0.6300 mg | INHALATION_SOLUTION | RESPIRATORY_TRACT | Status: DC | PRN

## 2014-02-16 MED ORDER — DIGOXIN 250 MCG PO TABS
0.2500 mg | ORAL_TABLET | Freq: Every day | ORAL | Status: DC
  Administered 2014-02-16 – 2014-02-19 (×4): 0.25 mg via ORAL
  Filled 2014-02-16 (×4): qty 1

## 2014-02-16 MED ORDER — QUETIAPINE 12.5 MG HALF TABLET
12.5000 mg | ORAL_TABLET | Freq: Every day | ORAL | Status: DC
  Administered 2014-02-16 – 2014-02-18 (×3): 12.5 mg via ORAL
  Filled 2014-02-16 (×4): qty 1

## 2014-02-16 MED ORDER — WARFARIN SODIUM 4 MG PO TABS
4.0000 mg | ORAL_TABLET | ORAL | Status: DC
  Administered 2014-02-16: 4 mg via ORAL
  Filled 2014-02-16: qty 1

## 2014-02-16 MED ORDER — HALOPERIDOL LACTATE 5 MG/ML IJ SOLN
2.0000 mg | Freq: Four times a day (QID) | INTRAMUSCULAR | Status: DC | PRN
  Administered 2014-02-16: 2 mg via INTRAVENOUS
  Filled 2014-02-16: qty 1

## 2014-02-16 MED ORDER — WARFARIN SODIUM 3 MG PO TABS
3.0000 mg | ORAL_TABLET | ORAL | Status: DC
  Administered 2014-02-17 – 2014-02-18 (×2): 3 mg via ORAL
  Filled 2014-02-16 (×3): qty 1

## 2014-02-16 MED ORDER — METOPROLOL TARTRATE 50 MG PO TABS
50.0000 mg | ORAL_TABLET | Freq: Two times a day (BID) | ORAL | Status: DC
  Administered 2014-02-16 – 2014-02-17 (×3): 50 mg via ORAL
  Filled 2014-02-16 (×4): qty 1

## 2014-02-16 NOTE — Progress Notes (Signed)
Patient found sitting on her buttocks. Patient wanted to go home and she stated that she need to outside to get home. Patient stated and Daughter Wilburn Cornelia confirm that patient had fallen Monday at home and got a skin tear on her left elbow, which started to re-bleeded with this fall. The skin tear is not new for patient. Received order for medication and safety sitter from Dr. Thereasa Solo. Patient states that she has no pain from fallen and the skin tear is not new. Family states patient has fallen numerous times at home.  02/16/14 1612  What Happened  Was fall witnessed? No  Was patient injured? Yes  Patient found on floor  Found by Staff-comment  Stated prior activity ambulating-unassisted  Follow Up  MD notified Dr. Thereasa Solo, NP- [place]  Time MD notified [phone]  Family notified Yes-comment (shelby, dtr notified)  Time family notified 1615  Additional tests No  Simple treatment Dressing  Progress note created (see row info) Yes  Fall Risk Assessment  Risk Factor Category (scoring not indicated) History of more than one fall within 6 months before admission (document High fall risk)  Patient's Fall Risk High Fall Risk (>13 points)  Fall Risk Interventions  Required Bundle Interventions *See Row Information* High fall risk - low, moderate, and high requirements implemented  Additional Interventions Secure all tubes/drains  Vitals  Temp 97.8 F (36.6 C)  Temp src Oral  BP 134/88 mmHg  BP Location Right arm  BP Method Automatic  Patient Position, if appropriate Sitting  Pulse Rate ! 110  Pulse Rate Source Monitor  Resp ! 27  Oxygen Therapy  SpO2 92 %  O2 Device Nasal cannula  O2 Flow Rate (L/min) 5 L/min  Pain Assessment  Pain Assessment No/denies pain  PCA/Epidural/Spinal Assessment  Respiratory Pattern Regular  Neurological  Neuro (WDL) X  Level of Consciousness Other (Comment) (confused)  Orientation Level Disoriented to situation  Cognition Impulsive;Poor  attention/concentration;Memory impairment  Speech Clear  Glasgow Coma Scale  Eye Opening 4  Best Verbal Response 5  Best Motor Response 6  Glasgow Coma Scale Score 15  Musculoskeletal  Musculoskeletal (WDL) WDL  Assistive Device BSC  Integumentary  Integumentary (WDL) X  Skin Integrity Skin tear (old skin tear from earlier this week reopen from previous )  Skin Tear Location Arm  Skin Tear Location Orientation Left  Skin Tear Intervention Foam  Skin Turgor Non-tenting

## 2014-02-16 NOTE — Progress Notes (Addendum)
Patient Name: Colleen [name] Date of Encounter: 02/16/2014  Principal Problem:   Atrial fibrillation with RVR Active Problems:   HYPERLIPIDEMIA   TOBACCO ABUSE   HYPERTENSION, BENIGN   COPD   GASTROESOPHAGEAL REFLUX DISEASE   Diabetes mellitus   Chronic anticoagulation- Coumadin   HCAP (healthcare-associated pneumonia)   Chronic respiratory failure with hypoxia   Syncope   Length of Stay: 2  SUBJECTIVE  Disoriented when she woke up this morning - remembers coming to the hospital in an ambulance, but recalls little of her day in the ER. Comfortable at rest. Worsening RVR - now 120s-130s while receiving nebulized bronchodilator  CURRENT MEDS . ALPRAZolam  0.5 mg Oral QHS  . atorvastatin  20 mg Oral QPM  . ceFEPime (MAXIPIME) IV  1 g Intravenous Q8H  . darifenacin  15 mg Oral Daily  . digoxin  0.25 mg Oral Daily  . diltiazem  360 mg Oral Daily  . DULoxetine  120 mg Oral Daily  . ferrous sulfate  325 mg Oral BID WC  . furosemide  40 mg Oral Daily  . insulin aspart  0-9 Units Subcutaneous TID WC  . levalbuterol  0.63 mg Nebulization Q6H  . loratadine  10 mg Oral Daily  . methimazole  5 mg Oral Daily  . metoprolol tartrate  50 mg Oral BID  . mometasone-formoterol  2 puff Inhalation BID  . pantoprazole  40 mg Oral BID  . sodium chloride  3 mL Intravenous Q12H  . tiotropium  18 mcg Inhalation Daily  . trimethoprim  100 mg Oral QHS  . vancomycin  1,000 mg Intravenous Q12H  . Warfarin - Pharmacist Dosing Inpatient   Does not apply q1800    OBJECTIVE   Intake/Output Summary (Last 24 hours) at 02/16/14 0834 Last data filed at 02/16/14 0600  Gross per 24 hour  Intake    590 ml  Output   1300 ml  Net   -710 ml   Filed Weights   02/14/14 1618 02/15/14 1200 02/16/14 0342  Weight: 85.276 kg (188 lb) 85.1 kg (187 lb 9.8 oz) 85.1 kg (187 lb 9.8 oz)    PHYSICAL EXAM Filed Vitals:   02/16/14 0342 02/16/14 0712 02/16/14 0800 02/16/14 0824  BP: 109/97 139/104 124/84    Pulse: 127 140 131   Temp: 98.3 F (36.8 C) 98.6 F (37 C)    TempSrc: Oral Oral    Resp: 19 33 23   Height:      Weight: 85.1 kg (187 lb 9.8 oz)     SpO2: 93% 92% 94% 96%   General: Alert, oriented x3, no distress Head: no evidence of trauma, PERRL, EOMI, no exophtalmos or lid lag, no myxedema, no xanthelasma; normal ears, nose and oropharynx Neck: normal jugular venous pulsations and no hepatojugular reflux; brisk carotid pulses without delay and no carotid bruits Chest: emphysematous chest, clear to auscultation, no signs of consolidation by percussion or palpation, normal fremitus, symmetrical and full respiratory excursions Cardiovascular: normal position and quality of the apical impulse, irregular rhythm, normal first and second heart sounds, no rubs or gallops, no murmur Abdomen: no tenderness or distention, no masses by palpation, no abnormal pulsatility or arterial bruits, normal bowel sounds, no hepatosplenomegaly Extremities: no clubbing, cyanosis or edema; 2+ radial, ulnar and brachial pulses bilaterally; 2+ right femoral, posterior tibial and dorsalis pedis pulses; 2+ left femoral, posterior tibial and dorsalis pedis pulses; no subclavian or femoral bruits Neurological: grossly nonfocal  LABS  CBC  Recent Labs  02/14/14 1729 02/15/14 1340  WBC 7.6 9.0  NEUTROABS 4.9  --   HGB 11.2* 10.0*  HCT 39.5 35.0*  MCV 75.1* 74.8*  PLT 276 123456   Basic Metabolic Panel  Recent Labs  02/14/14 1729 02/15/14 1340 02/16/14 0118  NA 138 136* 139  K 3.4* 3.7 3.3*  CL 94* 95* 96  CO2 30 29 30   GLUCOSE 115* 132* 102*  BUN 18 13 12   CREATININE 0.88 0.76 0.76  CALCIUM 9.1 8.7 8.9  MG 1.7  --   --    Cardiac Enzymes  Recent Labs  02/15/14 1340 02/15/14 1934 02/16/14 0118  TROPONINI <0.30 <0.30 <0.30    Radiology Studies Imaging results have been reviewed and Ct Head Wo Contrast  02/14/2014   CLINICAL DATA:  Fall, atrial fibrillation  EXAM: CT HEAD WITHOUT  CONTRAST  TECHNIQUE: Contiguous axial images were obtained from the base of the skull through the vertex without intravenous contrast.  COMPARISON:  12/07/2013  FINDINGS: No skull fracture is noted. There is mucosal thickening right maxillary sinus. No intracranial hemorrhage, mass effect or midline shift. No acute cortical infarction. No mass lesion is noted on this unenhanced scan. Stable encephalomalacia from prior infarct in left occipital lobe. Mild cerebral atrophy again noted.  IMPRESSION: No acute intracranial abnormality. 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 Mild cerebral atrophy again noted.   Electronically Signed   By: Lahoma Crocker M.D.   On: 02/14/2014 17:39   Nm Myocar Multi W/spect W/wall Motion / Ef  02/15/2014   CLINICAL DATA:  Chest pain  EXAM: MYOCARDIAL IMAGING WITH SPECT (REST AND PHARMACOLOGIC-STRESS)  GATED LEFT VENTRICULAR WALL MOTION STUDY  LEFT VENTRICULAR EJECTION FRACTION  TECHNIQUE: Standard myocardial SPECT imaging was performed after resting intravenous injection of 10 mCi Tc-44m sestamibi. Subsequently, intravenous infusion of Lexiscan was performed under the supervision of the Cardiology staff. At peak effect of the drug, 30 mCi Tc-24m sestamibi was injected intravenously and standard myocardial SPECT imaging was performed. Quantitative gated imaging was also performed to evaluate left ventricular wall motion, and estimate left ventricular ejection fraction.  COMPARISON:  None.  FINDINGS: SPECT: No stress-induced perfusion defect. There is a fixed defect involving the apex extending into the anteroseptal region.  Wall motion:  Septal hypokinesis.  Ejection fraction: 51%. End-diastolic volume 91 cc. End systolic volume 44 cc.  IMPRESSION: No evidence of stress induced ischemia.  Fixed defect in the apex and anteroseptal region associated with septal hypokinesis.   Electronically Signed   By: Maryclare Bean M.D.   On: 02/15/2014 12:20   Dg Chest  Portable 1 View  02/14/2014   CLINICAL DATA:  Weakness.  EXAM: PORTABLE CHEST - 1 VIEW  COMPARISON:  12/07/2013  FINDINGS: The heart is upper limits of normal in size. There is mild tortuosity and calcification of the thoracic aorta. There is a right lower lobe infiltrate and effusion. The left lung is clear. The bony thorax is intact.  IMPRESSION: Right lower lobe infiltrate and effusion.   Electronically Signed   By: Kalman Jewels M.D.   On: 02/14/2014 16:55    TELE AF with RVR, no ventricular arrhythmia   ASSESSMENT AND PLAN Appreciate EP recommendations. Focus on rate control, rather than rhythm control strategy. Increase rate control meds. Target resting HR <100-110, allow a little higher in setting of pneumonia, especially while receiving beta agonists. Note small fixed defect on nuclear study - no ischemia. She may have CAD, but cardiac cath  not indicated in the absence of angina. Reported EF of 51% by scintigraphy (gated images during Afib) less reliable than echo report of normal EF.   Sanda Klein, MD, [facility] [phone] office [phone] pager 02/16/2014 8:34 AM

## 2014-02-16 NOTE — Progress Notes (Signed)
Progress Note Colleen [name]   Colleen [name] NGE:[id number] DOB: [birth year] DOA: 02/14/2014 PCP: [name], NP  Brief narrative: 63 y.o. female admitted on 02/14/2014 with history of Hypertension; Hiatal hernia; Adenomatous polyp of colon; Acute asthmatic bronchitis; GERD; COPD; IBS; Urinary incontinence; Esophagitis; Chronic gastritis; Duodenitis without hemorrhage (10/01/2001); Diverticulosis; Anemia; Hyperlipidemia; Decreased peripheral vision of right eye; Pneumonia (2011); Hyperthyroidism; Type II diabetes mellitus; Stroke; Arthritis; Depression; On home oxygen therapy; PAF and Atrial flutter who presented with passing out spells, and altered mental status. She stated that she passed out 3 times over the preceding week. She could not recall the events around the episode but did sustain a fall and a bruise on her left elbow. She denied head injury. In the ED, patient found to be in A. fib with RVR.  Subjective: Alert and pleasant but confused.  Unable to tell me where she is or why.  Denies any complaints.   Assessment/Plan:  Atrial fibrillation with RVR - failed DCCV 01/06/14 - EP has evaluated - Flecainide d/c'd and Cardizem being increased - medical management only due to comorbidities, w/ focus on rate control   Syncope - likely due to rapid A-fib / arrythmia  - CT head negative  - Myoview 2/17 revealed a small fixed defect w/ no ischemia - per Cards "cardiac cath not indicated in the absence of angina" - no driving for 6 mo  [facility]-associated pneumonia - urine strep and legionella negative  - continue current abx and follow closely   COPD - Chronic respiratory failure with hypoxia in current smoker - O2 requirements climbing - pt becoming more confused - check ABG to r/o hypercarbia   Toxic metabolic enceph / acute delirium  - check ABG to r/o hypercarbia - check M01 and folic acid - possible sundowning - trial of seroquel tonight if no other  etiology apparent   Diabetes mellitus - SSI -  Metformin on hold - CBG well controlled   Chronic anticoagulation - Coumadin per Pharmacy - INR therapeutic  HTN - stable  Hypokalemia - goal is to keep K+ 4.0 - replace again today - check Mg - follow   Chronic anemia - stable  Code Status: FULL Family Communication: no family present at time of exam  Disposition Plan: SDU  Consultants: Cardiology EP  Procedures: ETT with myoview 2/17  Antibiotics: Vanc 2/16 >> Cefepime 2/16 >>  DVT prophylaxis: Coumadin  Objective: Filed Weights   02/14/14 1618 02/15/14 1200 02/16/14 0342  Weight: 85.276 kg (188 lb) 85.1 kg (187 lb 9.8 oz) 85.1 kg (187 lb 9.8 oz)   Blood pressure 107/68, pulse 121, temperature 98.5 F (36.9 C), temperature source Oral, resp. rate 32, height 5\' 6"  (1.676 m), weight 85.1 kg (187 lb 9.8 oz), SpO2 95.00%.  Intake/Output Summary (Last 24 hours) at 02/16/14 1340 Last data filed at 02/16/14 0600  Gross per 24 hour  Intake    490 ml  Output   1300 ml  Net   -810 ml   Exam: General: No acute respiratory distress while at rest in bed  Lungs: crackles RLL - good air movement th/o other fields - no wheeze  Cardiovascular: Irregular rate and rhythm without murmur gallop or rub  Abdomen: Nontender, nondistended, soft, bowel sounds positive, no rebound, no ascites, no appreciable mass Extremities: No significant cyanosis, clubbing, or edema bilateral lower extremities  Data Reviewed: Basic Metabolic Panel:  Recent Labs Lab 02/14/14 1729 02/15/14 1340 02/16/14 0118  NA  138 136* 139  K 3.4* 3.7 3.3*  CL 94* 95* 96  CO2 30 29 30   GLUCOSE 115* 132* 102*  BUN 18 13 12   CREATININE 0.88 0.76 0.76  CALCIUM 9.1 8.7 8.9  MG 1.7  --   --    Liver Function Tests: No results found for this basename: AST, ALT, ALKPHOS, BILITOT, PROT, ALBUMIN,  in the last 168 hours  CBC:  Recent Labs Lab 02/14/14 1729 02/15/14 1340  WBC 7.6 9.0  NEUTROABS 4.9  --    HGB 11.2* 10.0*  HCT 39.5 35.0*  MCV 75.1* 74.8*  PLT 276 215   Cardiac Enzymes:  Recent Labs Lab 02/15/14 1340 02/15/14 1934 02/16/14 0118  TROPONINI <0.30 <0.30 <0.30   BNP (last 3 results)  Recent Labs  02/14/14 1729  PROBNP 6018.0*   CBG:  Recent Labs Lab 02/15/14 1217 02/15/14 1700 02/15/14 2154 02/16/14 0715 02/16/14 1221  GLUCAP 161* 85 175* 147* 87    Recent Results (from the past 240 hour(s))  MRSA PCR SCREENING     Status: None   Collection Time    02/15/14  1:39 PM      Result Value Ref Range Status   MRSA by PCR NEGATIVE  NEGATIVE Final   Comment:            The GeneXpert MRSA Assay (FDA     approved for NASAL specimens     only), is one component of a     comprehensive MRSA colonization     surveillance program. It is not     intended to diagnose MRSA     infection nor to guide or     monitor treatment for     MRSA infections.    Studies:  Recent x-ray studies have been reviewed in detail by the Attending Physician  Scheduled Meds:  Scheduled Meds: . ALPRAZolam  0.5 mg Oral QHS  . atorvastatin  20 mg Oral QPM  . ceFEPime (MAXIPIME) IV  1 g Intravenous Q8H  . darifenacin  15 mg Oral Daily  . digoxin  0.25 mg Oral Daily  . diltiazem  360 mg Oral Daily  . DULoxetine  120 mg Oral Daily  . ferrous sulfate  325 mg Oral BID WC  . furosemide  40 mg Oral Daily  . insulin aspart  0-9 Units Subcutaneous TID WC  . levalbuterol  0.63 mg Nebulization Q6H  . loratadine  10 mg Oral Daily  . methimazole  5 mg Oral Daily  . metoprolol tartrate  50 mg Oral BID  . mometasone-formoterol  2 puff Inhalation BID  . pantoprazole  40 mg Oral BID  . sodium chloride  3 mL Intravenous Q12H  . tiotropium  18 mcg Inhalation Daily  . trimethoprim  100 mg Oral QHS  . vancomycin  1,000 mg Intravenous Q12H  . [START ON 02/17/2014] warfarin  3 mg Oral Once per day on Sun Mon Tue Thu Fri Sat  . warfarin  4 mg Oral Q Wed-1800  . Warfarin - Pharmacist Dosing  Inpatient   Does not apply q1800   Time spent on care of this patient: 35 mins   [facility] T, MD  [facility] Office  [phone] Pager - Text Page per Amion as per below:  On-Call/Text Page:      [email or web address]      password [email or web address]  If 7PM-7AM, please contact night-coverage [email or web address] Password [email or web address] 02/16/2014, 1:40 PM   LOS: 2 days

## 2014-02-16 NOTE — Telephone Encounter (Signed)
Pt is currently admitted

## 2014-02-16 NOTE — Progress Notes (Signed)
ANTICOAGULATION CONSULT NOTE - Follow Up Consult  Pharmacy Consult for Coumadin Indication: atrial fibrillation  Allergies  Allergen Reactions  . Crestor [Rosuvastatin]   . Latex Hives  . Wellbutrin [Bupropion]     Patient Measurements: Height: 5\' 6"  (167.6 cm) Weight: 187 lb 9.8 oz (85.1 kg) IBW/kg (Calculated) : 59.3  Vital Signs: Temp: 98.5 F (36.9 C) (02/18 1135) Temp src: Oral (02/18 1135) BP: 107/68 mmHg (02/18 1135) Pulse Rate: 121 (02/18 1135)  Labs:  Recent Labs  02/14/14 1729 02/15/14 1340 02/15/14 1934 02/16/14 0118  HGB 11.2* 10.0*  --   --   HCT 39.5 35.0*  --   --   PLT 276 215  --   --   LABPROT 25.8* 23.5*  --  22.2*  INR 2.45* 2.17*  --  2.02*  CREATININE 0.88 0.76  --  0.76  TROPONINI  --  <0.30 <0.30 <0.30    Estimated Creatinine Clearance: 79.1 ml/min (by C-G formula based on Cr of 0.76).  Assessment: 63 year old female continuing home Coumadin for Afib. INR therapeutic. Hgb low but stable, plt ok.  Goal of Therapy:  INR 2-3 Monitor platelets by anticoagulation protocol: Yes   Plan:  1. Daily INR 2. Start home coumadin dose of 3mg  daily except for 4mg  on Wed  Sherlon Handing, PharmD, BCPS Clinical pharmacist, pager [phone] 02/16/2014,12:48 PM

## 2014-02-17 DIAGNOSIS — I359 Nonrheumatic aortic valve disorder, unspecified: Secondary | ICD-10-CM

## 2014-02-17 DIAGNOSIS — R0902 Hypoxemia: Secondary | ICD-10-CM

## 2014-02-17 DIAGNOSIS — Z7901 Long term (current) use of anticoagulants: Secondary | ICD-10-CM

## 2014-02-17 DIAGNOSIS — J9610 Chronic respiratory failure, unspecified whether with hypoxia or hypercapnia: Secondary | ICD-10-CM

## 2014-02-17 LAB — VITAMIN B12: Vitamin B-12: 627 pg/mL (ref 211–911)

## 2014-02-17 LAB — GLUCOSE, CAPILLARY
GLUCOSE-CAPILLARY: 101 mg/dL — AB (ref 70–99)
Glucose-Capillary: 148 mg/dL — ABNORMAL HIGH (ref 70–99)
Glucose-Capillary: 165 mg/dL — ABNORMAL HIGH (ref 70–99)
Glucose-Capillary: 100 mg/dL — ABNORMAL HIGH (ref 70–99)

## 2014-02-17 LAB — CBC
HCT: 38.6 % (ref 36.0–46.0)
Hemoglobin: 11.2 g/dL — ABNORMAL LOW (ref 12.0–15.0)
MCH: 21.4 pg — ABNORMAL LOW (ref 26.0–34.0)
MCHC: 29 g/dL — ABNORMAL LOW (ref 30.0–36.0)
MCV: 73.8 fL — ABNORMAL LOW (ref 78.0–100.0)
Platelets: 145 10*3/uL — ABNORMAL LOW (ref 150–400)
RBC: 5.23 MIL/uL — AB (ref 3.87–5.11)
RDW: 20.2 % — ABNORMAL HIGH (ref 11.5–15.5)
WBC: 9 10*3/uL (ref 4.0–10.5)

## 2014-02-17 LAB — PROTIME-INR
INR: 2.28 — ABNORMAL HIGH (ref 0.00–1.49)
Prothrombin Time: 24.4 seconds — ABNORMAL HIGH (ref 11.6–15.2)

## 2014-02-17 LAB — BASIC METABOLIC PANEL
BUN: 15 mg/dL (ref 6–23)
CALCIUM: 8.9 mg/dL (ref 8.4–10.5)
CHLORIDE: 93 meq/L — AB (ref 96–112)
CO2: 26 meq/L (ref 19–32)
CREATININE: 0.88 mg/dL (ref 0.50–1.10)
GFR calc Af Amer: 79 mL/min — ABNORMAL LOW (ref >90)
GFR calc non Af Amer: 68 mL/min — ABNORMAL LOW (ref >90)
GLUCOSE: 94 mg/dL (ref 70–99)
Potassium: 3.8 mEq/L (ref 3.7–5.3)
Sodium: 135 mEq/L — ABNORMAL LOW (ref 137–147)

## 2014-02-17 LAB — MAGNESIUM: Magnesium: 1.7 mg/dL (ref 1.5–2.5)

## 2014-02-17 LAB — FOLATE: Folate: 11.5 ng/mL

## 2014-02-17 MED ORDER — METOPROLOL TARTRATE 50 MG PO TABS
75.0000 mg | ORAL_TABLET | Freq: Two times a day (BID) | ORAL | Status: DC
  Administered 2014-02-17 – 2014-02-19 (×4): 75 mg via ORAL
  Filled 2014-02-17 (×5): qty 1

## 2014-02-17 NOTE — Progress Notes (Signed)
ANTICOAGULATION/ANTIBIOTIC CONSULT NOTE - Follow Up Consult  Pharmacy Consult for Coumadin and Vancomycin and Cefepime Indication: atrial fibrillation  Allergies  Allergen Reactions  . Crestor [Rosuvastatin]   . Latex Hives  . Wellbutrin [Bupropion]     Patient Measurements: Height: 5\' 6"  (167.6 cm) Weight: 190 lb 7.6 oz (86.4 kg) IBW/kg (Calculated) : 59.3  Vital Signs: Temp: 98.5 F (36.9 C) (02/19 1159) Temp src: Oral (02/19 1159) BP: 109/78 mmHg (02/19 1159) Pulse Rate: 111 (02/19 1159)  Labs:  Recent Labs  02/14/14 1729 02/15/14 1340 02/15/14 1934 02/16/14 0118 02/17/14 0305  HGB 11.2* 10.0*  --   --  11.2*  HCT 39.5 35.0*  --   --  38.6  PLT 276 215  --   --  145*  LABPROT 25.8* 23.5*  --  22.2* 24.4*  INR 2.45* 2.17*  --  2.02* 2.28*  CREATININE 0.88 0.76  --  0.76 0.88  TROPONINI  --  <0.30 <0.30 <0.30  --     Estimated Creatinine Clearance: 72.4 ml/min (by C-G formula based on Cr of 0.88).  Assessment: AC: 63 year old female continuing home Coumadin for Afib. INR therapeutic. CBC stable. No bleeding noted.  ID: Cefepime/vanc D#4 for PNA. Afeb. WBC WNL. PCN is listed as an allergy but no reaction documented, went to clarify with patient and she states she is not allergic to PCN. No cx.  Cefepime 2/16>> Vanc 2/16>>  Goal of Therapy:  Vancomycin trough 15-20 mcg/ml INR 2-3 Monitor platelets by anticoagulation protocol: Yes   Plan:  1. Cefepime 1gm IV Q8H 2. Vanc 1gm IV Q12H 3. F/u renal fxn, clinical status and trough tomorrow 4. Daily INR 5. Continue coumadin dose of 3mg  daily except for 4mg  on [place], PharmD, BCPS Clinical pharmacist, pager [phone] 02/17/2014,2:24 PM

## 2014-02-17 NOTE — Progress Notes (Signed)
Patient Name: Colleen [name] Date of Encounter: 02/17/2014  Principal Problem:   Atrial fibrillation with RVR Active Problems:   HYPERLIPIDEMIA   TOBACCO ABUSE   HYPERTENSION, BENIGN   COPD   GASTROESOPHAGEAL REFLUX DISEASE   Diabetes mellitus   Chronic anticoagulation- Coumadin   HCAP (healthcare-associated pneumonia)   Chronic respiratory failure with hypoxia   Syncope   Length of Stay: 3  SUBJECTIVE  Still coughing and very dyspneic. Persistent AF with RVR  CURRENT MEDS . ALPRAZolam  0.5 mg Oral QHS  . atorvastatin  20 mg Oral QPM  . ceFEPime (MAXIPIME) IV  1 g Intravenous Q8H  . darifenacin  15 mg Oral Daily  . digoxin  0.25 mg Oral Daily  . diltiazem  360 mg Oral Daily  . DULoxetine  120 mg Oral Daily  . ferrous sulfate  325 mg Oral BID WC  . furosemide  40 mg Oral Daily  . insulin aspart  0-9 Units Subcutaneous TID WC  . loratadine  10 mg Oral Daily  . methimazole  5 mg Oral Daily  . metoprolol tartrate  75 mg Oral BID  . mometasone-formoterol  2 puff Inhalation BID  . pantoprazole  40 mg Oral BID  . potassium chloride  40 mEq Oral Once  . QUEtiapine  12.5 mg Oral QHS  . sodium chloride  3 mL Intravenous Q12H  . tiotropium  18 mcg Inhalation Daily  . vancomycin  1,000 mg Intravenous Q12H  . warfarin  3 mg Oral Once per day on Sun Mon Tue Thu Fri Sat  . warfarin  4 mg Oral Q Wed-1800  . Warfarin - Pharmacist Dosing Inpatient   Does not apply q1800    OBJECTIVE   Intake/Output Summary (Last 24 hours) at 02/17/14 1336 Last data filed at 02/17/14 1240  Gross per 24 hour  Intake   1353 ml  Output   2650 ml  Net  -1297 ml   Filed Weights   02/15/14 1200 02/16/14 0342 02/17/14 0605  Weight: 85.1 kg (187 lb 9.8 oz) 85.1 kg (187 lb 9.8 oz) 86.4 kg (190 lb 7.6 oz)    PHYSICAL EXAM Filed Vitals:   02/17/14 0858 02/17/14 0937 02/17/14 0938 02/17/14 1159  BP:   115/80 109/78  Pulse:  123 123 111  Temp:    98.5 F (36.9 C)  TempSrc:    Oral  Resp:    23   Height:      Weight:      SpO2: 92%   93%   General: Alert, oriented x3, no distress Head: no evidence of trauma, PERRL, EOMI, no exophtalmos or lid lag, no myxedema, no xanthelasma; normal ears, nose and oropharynx Neck: normal jugular venous pulsations and no hepatojugular reflux; brisk carotid pulses without delay and no carotid bruits Chest: bilateral rales and wheezing, emphysematous chest Cardiovascular: normal position and quality of the apical impulse, irregular rhythm, normal first and second heart sounds, no rubs or gallops, no murmur Abdomen: no tenderness or distention, no masses by palpation, no abnormal pulsatility or arterial bruits, normal bowel sounds, no hepatosplenomegaly Extremities: no clubbing, cyanosis or edema; 2+ radial, ulnar and brachial pulses bilaterally; 2+ right femoral, posterior tibial and dorsalis pedis pulses; 2+ left femoral, posterior tibial and dorsalis pedis pulses; no subclavian or femoral bruits Neurological: grossly nonfocal  LABS  CBC  Recent Labs  02/14/14 1729 02/15/14 1340 02/17/14 0305  WBC 7.6 9.0 9.0  NEUTROABS 4.9  --   --   HGB  11.2* 10.0* 11.2*  HCT 39.5 35.0* 38.6  MCV 75.1* 74.8* 73.8*  PLT 276 215 433*   Basic Metabolic Panel  Recent Labs  02/14/14 1729  02/16/14 0118 02/17/14 0305  NA 138  < > 139 135*  K 3.4*  < > 3.3* 3.8  CL 94*  < > 96 93*  CO2 30  < > 30 26  GLUCOSE 115*  < > 102* 94  BUN 18  < > 12 15  CREATININE 0.88  < > 0.76 0.88  CALCIUM 9.1  < > 8.9 8.9  MG 1.7  --   --  1.7  < > = values in this interval not displayed.  Radiology Studies No results found.  TELE AF ventricular rate 100-130    ASSESSMENT AND PLAN Rate control remains elusive despite combined therapy with beta blocker, calcium channel blocker and digoxin. Will try to increase metoprolol dose. Will try this for 24 hours. If this is unsuccessful or if it causes side effects (worsening bronchospasm, hypotension) only good option  is amiodarone. This may lead to conversion to NSR, but she has been consistently therapeutically anticoagulated for > 2 months.   Sanda Klein, MD, [facility] [phone] office [phone] pager 02/17/2014 1:36 PM

## 2014-02-17 NOTE — Progress Notes (Signed)
Nutrition Brief Note  Patient identified on the Malnutrition Screening Tool (MST) Report for recent weight lost without trying (patient unsure of amount) and eating poorly because of a decreased appetite.  Per weight readings, patient's weight has trended up.  Wt Readings from Last 15 Encounters:  02/17/14 190 lb 7.6 oz (86.4 kg)  01/25/14 179 lb (81.194 kg)  01/17/14 177 lb (80.287 kg)  01/06/14 175 lb 0.7 oz (79.4 kg)  01/06/14 175 lb 0.7 oz (79.4 kg)  01/04/14 179 lb (81.194 kg)  12/08/13 184 lb 15.5 oz (83.9 kg)  10/03/10 201 lb (91.173 kg)  08/08/10 203 lb (92.08 kg)  06/21/08 213 lb 4 oz (96.73 kg)  04/19/08 211 lb 3 oz (95.794 kg)  04/05/08 208 lb 8 oz (94.575 kg)  02/05/08 214 lb 3 oz (97.155 kg)    Body mass index is 30.76 kg/(m^2). Patient meets criteria for Obesity Class I based on current BMI.   Current diet order is Carbohydrate Modified.  Reports a good appetite.  Labs and medications reviewed.   No nutrition interventions warranted at this time. If nutrition issues arise, please consult RD.   Arthur Holms, RD, LDN Pager #: [phone] After-Hours Pager #: [phone]

## 2014-02-17 NOTE — Progress Notes (Signed)
[facility] PROGRESS NOTE  Colleen [name] FGH:[id number] DOB: Jan 20, 1951 DOA: 02/14/2014 PCP: [name], NP  Brief narrative: 63 y.o. female admitted on 02/14/2014 with history of Hypertension; Hiatal hernia; Acute asthmatic bronchitis; GERD; COPD; IBS; Urinary incontinence; Esophagitis; Chronic gastritis; Duodenitis without hemorrhage (10/01/2001); Diverticulosis; Anemia; Hyperlipidemia; Decreased peripheral vision of right eye; Pneumonia (2011); Hyperthyroidism; Type II diabetes mellitus; Stroke; Arthritis; Depression; On home oxygen therapy; PAF and Atrial flutter who presented with passing out spells, and altered mental status. She stated that she passed out 3 times over the preceding week. She could not recall the events around the episode but did sustain a fall and a bruise on her left elbow. She denied head injury. In the ED, patient found to be in A. fib with RVR.  Assessment/Plan  Atrial fibrillation with RVR  - failed DCCV 01/06/14  - EP/Cardiology have evaluated.    - Flecainide d/c'd - Cardizem on board along with digoxin and metoprolol, increasing metoprolol today.   - HR continues to fluctuate from the 90s to the 110s.   Syncope  - likely due to rapid A-fib / arrythmia, no further events - CT head negative for acute issues  - Myoview 2/17 revealed a small fixed defect w/ no ischemia  - per Cards LHC not indicated at this time.  - no driving for 6 mo   [facility]-associated pneumonia  - urine strep and legionella negative  - Continue cefepime and vancomycin at this time, started on 2/16 - Oxygen needs increased, ABG only showed hypoxia - O2 as needed to keep pOx > 88   COPD - Chronic respiratory failure with hypoxia in current smoker  - O2 requirements climbing, stable at [facility] at this point.  - confusion improved today   Toxic metabolic enceph / acute delirium  - check C78 and folic acid, normal - possible sundowning  - Likely due to hypoxemia in the setting  of chronic COPD with pneumonia.  Will continue to monitor and wean O2 as possible.  She is improving today.   Diabetes mellitus  - SSI  - Metformin on hold  - CBG well controlled   Chronic anticoagulation  - Coumadin per Pharmacy  - INR therapeutic, 2.28 today   HTN  - stable   Hypokalemia  - goal is to keep K+ 4.0  - K 3.8 today, Mag 1.7  - follow daily  Chronic anemia  - stable  DVT PPX: Coumadin therapeutic  Consultants:  Cardiology  Pharmacy     Antibiotics:  Cefepime 2/16  Vancomycin 2/16  Code Status: Full Family Communication: Pt and daughter at bedside Disposition Plan: Home when medically stable  HPI/Subjective: No events overnight.  Confusion is improving.  She is alert and oriented today.   Objective: Filed Vitals:   02/17/14 0858 02/17/14 0937 02/17/14 0938 02/17/14 1159  BP:   115/80 109/78  Pulse:  123 123 111  Temp:    98.5 F (36.9 C)  TempSrc:    Oral  Resp:    23  Height:      Weight:      SpO2: 92%   93%    Intake/Output Summary (Last 24 hours) at 02/17/14 1508 Last data filed at 02/17/14 1240  Gross per 24 hour  Intake   1353 ml  Output   2650 ml  Net  -1297 ml    Exam:   General:  Pt is alert, follows commands appropriately, not in acute distress  Cardiovascular: Irreg Irreg, mildly tachycardia  to the 100s,  S1/S2  Respiratory: Decreased breath sounds bilaterally, no wheezing  Abdomen: Soft, non tender, non distended, bowel sounds present  Extremities: No edema, cyanosis  Neuro: Grossly nonfocal  Data Reviewed: Basic Metabolic Panel:  Recent Labs Lab 02/14/14 1729 02/15/14 1340 02/16/14 0118 02/17/14 0305  NA 138 136* 139 135*  K 3.4* 3.7 3.3* 3.8  CL 94* 95* 96 93*  CO2 30 29 30 26   GLUCOSE 115* 132* 102* 94  BUN 18 13 12 15   CREATININE 0.88 0.76 0.76 0.88  CALCIUM 9.1 8.7 8.9 8.9  MG 1.7  --   --  1.7   CBC:  Recent Labs Lab 02/14/14 1729 02/15/14 1340 02/17/14 0305  WBC 7.6 9.0 9.0   NEUTROABS 4.9  --   --   HGB 11.2* 10.0* 11.2*  HCT 39.5 35.0* 38.6  MCV 75.1* 74.8* 73.8*  PLT 276 215 145*   Cardiac Enzymes:  Recent Labs Lab 02/15/14 1340 02/15/14 1934 02/16/14 0118  TROPONINI <0.30 <0.30 <0.30   BNP: No components found with this basename: POCBNP,  CBG:  Recent Labs Lab 02/16/14 1221 02/16/14 1729 02/16/14 2108 02/17/14 0734 02/17/14 1157  GLUCAP 87 145* 130* 101* 148*    Recent Results (from the past 240 hour(s))  MRSA PCR SCREENING     Status: None   Collection Time    02/15/14  1:39 PM      Result Value Ref Range Status   MRSA by PCR NEGATIVE  NEGATIVE Final   Comment:            The GeneXpert MRSA Assay (FDA     approved for NASAL specimens     only), is one component of a     comprehensive MRSA colonization     surveillance program. It is not     intended to diagnose MRSA     infection nor to guide or     monitor treatment for     MRSA infections.     Scheduled Meds: . ALPRAZolam  0.5 mg Oral QHS  . atorvastatin  20 mg Oral QPM  . ceFEPime (MAXIPIME) IV  1 g Intravenous Q8H  . darifenacin  15 mg Oral Daily  . digoxin  0.25 mg Oral Daily  . diltiazem  360 mg Oral Daily  . DULoxetine  120 mg Oral Daily  . ferrous sulfate  325 mg Oral BID WC  . furosemide  40 mg Oral Daily  . insulin aspart  0-9 Units Subcutaneous TID WC  . loratadine  10 mg Oral Daily  . methimazole  5 mg Oral Daily  . metoprolol tartrate  75 mg Oral BID  . mometasone-formoterol  2 puff Inhalation BID  . pantoprazole  40 mg Oral BID  . potassium chloride  40 mEq Oral Once  . QUEtiapine  12.5 mg Oral QHS  . sodium chloride  3 mL Intravenous Q12H  . tiotropium  18 mcg Inhalation Daily  . vancomycin  1,000 mg Intravenous Q12H  . warfarin  3 mg Oral Once per day on Sun Mon Tue Thu Fri Sat  . warfarin  4 mg Oral Q Wed-1800  . Warfarin - Pharmacist Dosing Inpatient   Does not apply q1800   Continuous Infusions: . sodium chloride 20 mL/hr at 02/15/14 2039      Gilles Chiquito, MD  [facility] Pager [phone]  If 7PM-7AM, please contact night-coverage [email or web address] Password [email or web address] 02/17/2014, 3:08 PM   LOS: 3 days

## 2014-02-18 LAB — BASIC METABOLIC PANEL
BUN: 13 mg/dL (ref 6–23)
CALCIUM: 8.5 mg/dL (ref 8.4–10.5)
CO2: 28 meq/L (ref 19–32)
CREATININE: 0.8 mg/dL (ref 0.50–1.10)
Chloride: 98 mEq/L (ref 96–112)
GFR calc Af Amer: 89 mL/min — ABNORMAL LOW (ref >90)
GFR calc non Af Amer: 77 mL/min — ABNORMAL LOW (ref >90)
Glucose, Bld: 87 mg/dL (ref 70–99)
Potassium: 3.4 mEq/L — ABNORMAL LOW (ref 3.7–5.3)
Sodium: 139 mEq/L (ref 137–147)

## 2014-02-18 LAB — PROTIME-INR
INR: 2.45 — AB (ref 0.00–1.49)
Prothrombin Time: 25.8 seconds — ABNORMAL HIGH (ref 11.6–15.2)

## 2014-02-18 LAB — GLUCOSE, CAPILLARY
Glucose-Capillary: 90 mg/dL (ref 70–99)
Glucose-Capillary: 174 mg/dL — ABNORMAL HIGH (ref 70–99)
Glucose-Capillary: 94 mg/dL (ref 70–99)
Glucose-Capillary: 172 mg/dL — ABNORMAL HIGH (ref 70–99)

## 2014-02-18 LAB — VANCOMYCIN, TROUGH: Vancomycin Tr: 25 ug/mL — ABNORMAL HIGH (ref 10.0–20.0)

## 2014-02-18 MED ORDER — POTASSIUM CHLORIDE CRYS ER 20 MEQ PO TBCR
40.0000 meq | EXTENDED_RELEASE_TABLET | Freq: Once | ORAL | Status: AC
  Administered 2014-02-18: 40 meq via ORAL
  Filled 2014-02-18: qty 2

## 2014-02-18 MED ORDER — VANCOMYCIN HCL 10 G IV SOLR
1250.0000 mg | INTRAVENOUS | Status: DC
  Administered 2014-02-18: 1250 mg via INTRAVENOUS
  Filled 2014-02-18 (×2): qty 1250

## 2014-02-18 NOTE — Progress Notes (Signed)
Patient Name: Colleen [name] Date of Encounter: 02/18/2014  Principal Problem:   Atrial fibrillation with RVR Active Problems:   HYPERLIPIDEMIA   TOBACCO ABUSE   HYPERTENSION, BENIGN   COPD   GASTROESOPHAGEAL REFLUX DISEASE   Diabetes mellitus   Chronic anticoagulation- Coumadin   HCAP (healthcare-associated pneumonia)   Chronic respiratory failure with hypoxia   Syncope   Length of Stay: 4  SUBJECTIVE  Significant improvement in level of alertness, orientation, oxygenation and ventricular rate control.  CURRENT MEDS . ALPRAZolam  0.5 mg Oral QHS  . atorvastatin  20 mg Oral QPM  . ceFEPime (MAXIPIME) IV  1 g Intravenous Q8H  . darifenacin  15 mg Oral Daily  . digoxin  0.25 mg Oral Daily  . diltiazem  360 mg Oral Daily  . DULoxetine  120 mg Oral Daily  . ferrous sulfate  325 mg Oral BID WC  . furosemide  40 mg Oral Daily  . insulin aspart  0-9 Units Subcutaneous TID WC  . loratadine  10 mg Oral Daily  . methimazole  5 mg Oral Daily  . metoprolol tartrate  75 mg Oral BID  . mometasone-formoterol  2 puff Inhalation BID  . pantoprazole  40 mg Oral BID  . potassium chloride  40 mEq Oral Once  . QUEtiapine  12.5 mg Oral QHS  . sodium chloride  3 mL Intravenous Q12H  . tiotropium  18 mcg Inhalation Daily  . vancomycin  1,250 mg Intravenous Q24H  . warfarin  3 mg Oral Once per day on Sun Mon Tue Thu Fri Sat  . warfarin  4 mg Oral Q Wed-1800  . Warfarin - Pharmacist Dosing Inpatient   Does not apply q1800    OBJECTIVE   Intake/Output Summary (Last 24 hours) at 02/18/14 0927 Last data filed at 02/18/14 0916  Gross per 24 hour  Intake    600 ml  Output   2950 ml  Net  -2350 ml   Filed Weights   02/16/14 0342 02/17/14 0605 02/18/14 0700  Weight: 85.1 kg (187 lb 9.8 oz) 86.4 kg (190 lb 7.6 oz) 85.3 kg (188 lb 0.8 oz)    PHYSICAL EXAM Filed Vitals:   02/18/14 0400 02/18/14 0700 02/18/14 0826 02/18/14 0834  BP: 101/54  131/83   Pulse: 85  93   Temp: 97.8 F (36.6  C)  97.9 F (36.6 C)   TempSrc: Axillary  Oral   Resp: 21  23   Height:      Weight:  85.3 kg (188 lb 0.8 oz)    SpO2: 95%  96% 95%   General: Alert, oriented x3, no distress  Head: no evidence of trauma, PERRL, EOMI, no exophtalmos or lid lag, no myxedema, no xanthelasma; normal ears, nose and oropharynx  Neck: normal jugular venous pulsations and no hepatojugular reflux; brisk carotid pulses without delay and no carotid bruits  Chest: bilateral rales and wheezing, emphysematous chest  Cardiovascular: normal position and quality of the apical impulse, irregular rhythm, normal first and second heart sounds, no rubs or gallops, no murmur  Abdomen: no tenderness or distention, no masses by palpation, no abnormal pulsatility or arterial bruits, normal bowel sounds, no hepatosplenomegaly  Extremities: no clubbing, cyanosis or edema; 2+ radial, ulnar and brachial pulses bilaterally; 2+ right femoral, posterior tibial and dorsalis pedis pulses; 2+ left femoral, posterior tibial and dorsalis pedis pulses; no subclavian or femoral bruits  Neurological: grossly nonfocal  LABS  CBC  Recent Labs  02/15/14 1340 02/17/14  0305  WBC 9.0 9.0  HGB 10.0* 11.2*  HCT 35.0* 38.6  MCV 74.8* 73.8*  PLT 215 564*   Basic Metabolic Panel  Recent Labs  02/17/14 0305 02/18/14 0240  NA 135* 139  K 3.8 3.4*  CL 93* 98  CO2 26 28  GLUCOSE 94 87  BUN 15 13  CREATININE 0.88 0.80  CALCIUM 8.9 8.5  MG 1.7  --    Cardiac Enzymes  Recent Labs  02/15/14 1340 02/15/14 1934 02/16/14 0118  TROPONINI <0.30 <0.30 <0.30   Radiology Studies Imaging results have been reviewed and No results found.  TELE AF , ventricular rate 80-100   ASSESSMENT AND PLAN Appears to have acceptable rate control, using high doses of three AV nodal blockers. Will have to see how heart rate behaves with ambulation, but suspect will leave medications where they are currently. Follow up with Dr. Percival Spanish after  discharge.   Sanda Klein, MD, [facility] [phone] office [phone] pager 02/18/2014 9:27 AM

## 2014-02-18 NOTE — Care Management Note (Signed)
    Page 1 of 1   02/18/2014     3:00:14 PM   CARE MANAGEMENT NOTE 02/18/2014  Patient:  Colleen [name], Colleen [name]   Account Number:  [id number]  Date Initiated:  02/18/2014  Documentation initiated by:  [facility]  Subjective/Objective Assessment:   Admitted with Afib     Action/Plan:   Anticipated DC Date:  02/21/2014   Anticipated DC Plan:  SKILLED NURSING FACILITY  In-house referral  Clinical Social Worker      DC Planning Services  CM consult      Choice offered to / List presented to:             Status of service:  In process, will continue to follow Medicare Important Message given?   (If response is "NO", the following Medicare IM given date fields will be blank) Date Medicare IM given:   Date Additional Medicare IM given:    Discharge Disposition:    Per UR Regulation:  Reviewed for med. necessity/level of care/duration of stay  If discussed at [place] of Stay Meetings, dates discussed:    Comments:  02-18-14 2:50pm Luz Lex, [place] [phone] PT recommending [place] with 24 supervision.  From prior conversation states she does not have this.  SW consult placed.  02-17-14 11:30am [place] last night - nurse states very unsteady on feet. Suggested need for PT.  Nurse will call physician.  02-16-14 11:50am Luz Lex, RNBSN [phone] Talked with paitent.  Sitting up in bed - awake and alert. States lives at home with daughter.  Has aide in 6.5 hours per day.  States daughter is not home with her all the time.  States she feels living situtation prior to coming in was fine.  Denies needing anything else.

## 2014-02-18 NOTE — Progress Notes (Signed)
ANTICOAGULATION/ANTIBIOTIC CONSULT NOTE - Follow Up Consult  Pharmacy Consult for Coumadin and Vancomycin and Cefepime Indication: atrial fibrillation and HCAP  Allergies  Allergen Reactions  . Crestor [Rosuvastatin]   . Latex Hives  . Wellbutrin [Bupropion]     Patient Measurements: Height: 5\' 6"  (167.6 cm) Weight: 188 lb 0.8 oz (85.3 kg) IBW/kg (Calculated) : 59.3  Vital Signs: Temp: 97.9 F (36.6 C) (02/20 0826) Temp src: Oral (02/20 0826) BP: 131/83 mmHg (02/20 0826) Pulse Rate: 93 (02/20 0826)  Labs:  Recent Labs  02/15/14 1340 02/15/14 1934 02/16/14 0118 02/17/14 0305 02/18/14 0240  HGB 10.0*  --   --  11.2*  --   HCT 35.0*  --   --  38.6  --   PLT 215  --   --  145*  --   LABPROT 23.5*  --  22.2* 24.4* 25.8*  INR 2.17*  --  2.02* 2.28* 2.45*  CREATININE 0.76  --  0.76 0.88 0.80  TROPONINI <0.30 <0.30 <0.30  --   --     Estimated Creatinine Clearance: 79.2 ml/min (by C-G formula based on Cr of 0.8).  Assessment: AC: Pt on warfarin for afib. INR therapeutic on home dose, no bleeding noted. Home dose = 3 mg daily except 4 mg on Wednesdays  ID: Cefepime and vancomycin Day#5 for HCAP. Afeb. WBC WNL. No cx. Vancomycin trough 25 mcg/ml (supratherapeutic) on 1gm IV q12h.  Cefepime 2/16>> Vanc 2/16>>  Goal of Therapy:  Vancomycin trough 15-20 mcg/ml INR 2-3 Monitor platelets by anticoagulation protocol: Yes   Plan:  1. Continue Cefepime 1gm IV Q8H.  2. Change Vancomycin to 1250mg  IV q24h. 3. F/u renal fxn, clinical status and trough at SS 4. Daily INR - if stable tomorrow, consider change to M/W/F 5. Continue home coumadin dose of 3mg  daily except for 4mg  on [place], PharmD, BCPS Clinical pharmacist, pager [phone] 02/18/2014,8:48 AM

## 2014-02-18 NOTE — Evaluation (Signed)
Physical Therapy Evaluation Patient Details Name: Colleen [name] MRN: [id number] DOB: May 27, 1951 Today's Date: 02/18/2014 Time: 3419-3790 PT Time Calculation (min): 24 min  PT Assessment / Plan / Recommendation History of Present Illness  Pt admit with afib with RVR  Clinical Impression  Pt admitted with above. Pt currently with functional limitations due to the deficits listed below (see PT Problem List). Pt will benefit from skilled PT to increase their independence and safety with mobility to allow discharge to the venue listed below. Pt has 24 hour care and will need to use her RW at all times due to balance deficits as well as receive HHPT and HHAide.    PT Assessment  Patient needs continued PT services    Follow Up Recommendations  Home health PT;Supervision/Assistance - 24 hour    Does the patient have the potential to tolerate intense rehabilitation      Barriers to Discharge        Equipment Recommendations  None recommended by PT    Recommendations for Other Services     Frequency Min 3X/week    Precautions / Restrictions Precautions Precautions: Fall Restrictions Weight Bearing Restrictions: No   Pertinent Vitals/Pain Sats on 5LO2 at rest with >90%.  Ambulated with 4LO2 with sats >90%.  Replaced O2 as in room on departure at 5LO2.  HR 82-120 bpm with ambulation.  No pain.       Mobility  Bed Mobility Overal bed mobility: Needs Assistance Bed Mobility: Supine to Sit Supine to sit: Min assist General bed mobility comments: Needed incr time and assist for trunk Transfers Overall transfer level: Needs assistance Transfers: Sit to/from Stand Sit to Stand: Min guard;+2 safety/equipment General transfer comment: steadying assist needed initially upon standing.   Ambulation/Gait Ambulation/Gait assistance: Min guard;Min assist Ambulation Distance (Feet): 85 Feet Assistive device: None Gait Pattern/deviations: Step-to pattern;Decreased stride length;Decreased  step length - right;Decreased step length - left;Ataxic;Narrow base of support;Staggering left;Staggering right Gait velocity interpretation: <1.8 ft/sec, indicative of risk for recurrent falls General Gait Details: Pt staggering occasionally and reaching for furniture along the way.  Pt unsteady and needs steadying assist at times.  Daughter states she did that at home but was weaker at present.  Instructed pt and daughter that RW needs to be used at all times upon d/c.      Exercises General Exercises - Lower Extremity Ankle Circles/Pumps: AROM;Both;10 reps;Seated Long Arc Quad: AROM;Both;10 reps;Seated Hip Flexion/Marching: AROM;Both;10 reps;Seated   PT Diagnosis: Generalized weakness  PT Problem List: Decreased activity tolerance;Decreased balance;Decreased mobility;Decreased knowledge of use of DME;Decreased safety awareness;Decreased knowledge of precautions PT Treatment Interventions: DME instruction;Gait training;Functional mobility training;Therapeutic activities;Therapeutic exercise;Balance training;Patient/family education     PT Goals(Current goals can be found in the care plan section) Acute Rehab PT Goals Patient Stated Goal: to go home PT Goal Formulation: With patient Time For Goal Achievement: 02/25/14 Potential to Achieve Goals: Good  Visit Information  Last PT Received On: 02/18/14 Assistance Needed: +2 (lines only) History of Present Illness: Pt admit with afib with RVR       Prior [place] expects to be discharged to:: Private residence Living Arrangements: Children;Other relatives Available Help at Discharge: Family;Available 24 hours/day;Personal care attendant Type of Home: House Home Access: Stairs to enter [employer] of Steps: 4 Entrance Stairs-Rails: None Home Layout: One level Home Equipment: Walker - 4 wheels;Cane - single point;Shower seat - built in (2Lhome O2) Prior Function Level of Independence:  Independent Communication Communication: No difficulties  Dominant Hand: Right    Cognition  Cognition Arousal/Alertness: Awake/alert Behavior During Therapy: WFL for tasks assessed/performed Overall Cognitive Status: Impaired/Different from baseline Area of Impairment: Orientation;Safety/judgement;Problem solving Orientation Level: Disoriented to;Time Memory: Decreased short-term memory Safety/Judgement: Decreased awareness of safety Problem Solving: Requires verbal cues    Extremity/Trunk Assessment Upper Extremity Assessment Upper Extremity Assessment: Defer to OT evaluation Lower Extremity Assessment Lower Extremity Assessment: Generalized weakness Cervical / Trunk Assessment Cervical / Trunk Assessment: Normal   Balance Balance Overall balance assessment: Needs assistance;History of Falls Standing balance support: No upper extremity supported;During functional activity Standing balance-Leahy Scale: Fair Standing balance comment: Static stance can balance but needs at least one extremity supported.  Dynamically needs UE support bilaterally.   End of Session PT - End of Session Equipment Utilized During Treatment: Gait belt;Oxygen Activity Tolerance: Patient limited by fatigue Patient left: in chair;with call bell/phone within reach;with family/visitor present;with nursing/sitter in room Nurse Communication: Mobility status  GP     INGOLD,Shariah Assad 02/18/2014, 11:21 AM  [facility] [phone] (pager)

## 2014-02-18 NOTE — Progress Notes (Signed)
Progress Note [place] TEAM 1 - Stepdown/ICU TEAM   Colleen [name] ZOX:[id number] DOB: 1951-01-22 DOA: 02/14/2014 PCP: [name], NP  Brief narrative: 63 y.o. female admitted on 02/14/2014 with history of Hypertension; Hiatal hernia; Adenomatous polyp of colon; GERD; COPD; IBS; Urinary incontinence; Esophagitis; Chronic gastritis; Duodenitis without hemorrhage (10/01/2001); Diverticulosis; Anemia; Hyperlipidemia; Decreased peripheral vision of right eye; Pneumonia (2011); Hyperthyroidism; Type II diabetes mellitus; Stroke; Arthritis; Depression; On home oxygen therapy; PAF and Atrial flutter who presented with passing out spells, and altered mental status. She stated that she passed out 3 times over the preceding week. She could not recall the events around the episode but did sustain a fall and a bruise on her left elbow. She denied head injury. In the ED, patient found to be in A. fib with RVR.  Subjective: Much more alert today.  No new complaints.  Conversant and oriented.  Walked w/ PT and no SNF / Rehab needs identified.    Assessment/Plan:  Atrial fibrillation with RVR - failed DCCV 01/06/14 - EP has evaluated - Flecainide d/c'd and Cardizem adjusted - medical management only due to comorbidities, w/ focus on rate control  - rate control improved in general - follow w/ ambulation  - coumadin per Pharmacy   Syncope - likely due to rapid A-fib / arrythmia  - CT head negative  - Myoview 2/17 revealed a small fixed defect w/ no ischemia - per Cards "cardiac cath not indicated in the absence of angina" - no driving for 6 mo - no recurrence during hospital stay   [facility]-associated pneumonia - urine strep and legionella negative  - continue current abx - clinically improving steadily   COPD - Chronic respiratory failure with hypoxia in current smoker - appears stable at this time - on home O2 at 2LPM but admits to only wearing it off and on   Toxic metabolic enceph /  acute delirium  - ABG, W11, and folic acid not remarkable - possible sundowning v/s hypoxia related - much improved today   Diabetes mellitus - SSI -  Metformin on hold - CBG well controlled   Chronic anticoagulation - Coumadin per Pharmacy - INR therapeutic  HTN - stable  Hypokalemia - goal is to keep K+ 4.0 - replace - Mg ok at 1.7 - follow   Chronic anemia - stable  Code Status: FULL Family Communication: spoke w/ daughter who lives w her at bedside  Disposition Plan: SDU - possible d/c home w/ daughter in AM   Consultants: Cardiology EP  Procedures: ETT with myoview 2/17  Antibiotics: Vanc 2/16 >> Cefepime 2/16 >>  DVT prophylaxis: Coumadin  Objective: Filed Weights   02/16/14 0342 02/17/14 0605 02/18/14 0700  Weight: 85.1 kg (187 lb 9.8 oz) 86.4 kg (190 lb 7.6 oz) 85.3 kg (188 lb 0.8 oz)   Blood pressure 98/52, pulse 97, temperature 97.3 F (36.3 C), temperature source Oral, resp. rate 20, height 5\' 6"  (1.676 m), weight 85.3 kg (188 lb 0.8 oz), SpO2 95.00%.  Intake/Output Summary (Last 24 hours) at 02/18/14 1311 Last data filed at 02/18/14 1235  Gross per 24 hour  Intake    643 ml  Output   2450 ml  Net  -1807 ml   Exam: General: No acute respiratory distress in bedside chair  Lungs: crackles RLL - good air movement th/o other fields - no wheeze  Cardiovascular: Irregular rhythm - rate 80bpm - without murmur gallop or rub  Abdomen: Nontender, nondistended, soft, bowel sounds  positive, no rebound, no ascites, no appreciable mass Extremities: No significant cyanosis, clubbing, or edema bilateral lower extremities  Data Reviewed: Basic Metabolic Panel:  Recent Labs Lab 02/14/14 1729 02/15/14 1340 02/16/14 0118 02/17/14 0305 02/18/14 0240  NA 138 136* 139 135* 139  K 3.4* 3.7 3.3* 3.8 3.4*  CL 94* 95* 96 93* 98  CO2 30 29 30 26 28   GLUCOSE 115* 132* 102* 94 87  BUN 18 13 12 15 13   CREATININE 0.88 0.76 0.76 0.88 0.80  CALCIUM 9.1 8.7 8.9 8.9  8.5  MG 1.7  --   --  1.7  --    Liver Function Tests: No results found for this basename: AST, ALT, ALKPHOS, BILITOT, PROT, ALBUMIN,  in the last 168 hours  CBC:  Recent Labs Lab 02/14/14 1729 02/15/14 1340 02/17/14 0305  WBC 7.6 9.0 9.0  NEUTROABS 4.9  --   --   HGB 11.2* 10.0* 11.2*  HCT 39.5 35.0* 38.6  MCV 75.1* 74.8* 73.8*  PLT 276 215 145*   Cardiac Enzymes:  Recent Labs Lab 02/15/14 1340 02/15/14 1934 02/16/14 0118  TROPONINI <0.30 <0.30 <0.30   BNP (last 3 results)  Recent Labs  02/14/14 1729  PROBNP 6018.0*   CBG:  Recent Labs Lab 02/17/14 1157 02/17/14 1800 02/17/14 2127 02/18/14 0827 02/18/14 1157  GLUCAP 148* 165* 100* 90 174*    Recent Results (from the past 240 hour(s))  MRSA PCR SCREENING     Status: None   Collection Time    02/15/14  1:39 PM      Result Value Ref Range Status   MRSA by PCR NEGATIVE  NEGATIVE Final   Comment:            The GeneXpert MRSA Assay (FDA     approved for NASAL specimens     only), is one component of a     comprehensive MRSA colonization     surveillance program. It is not     intended to diagnose MRSA     infection nor to guide or     monitor treatment for     MRSA infections.    Studies:  Recent x-ray studies have been reviewed in detail by the Attending Physician  Scheduled Meds:  Scheduled Meds: . ALPRAZolam  0.5 mg Oral QHS  . atorvastatin  20 mg Oral QPM  . ceFEPime (MAXIPIME) IV  1 g Intravenous Q8H  . darifenacin  15 mg Oral Daily  . digoxin  0.25 mg Oral Daily  . diltiazem  360 mg Oral Daily  . DULoxetine  120 mg Oral Daily  . ferrous sulfate  325 mg Oral BID WC  . furosemide  40 mg Oral Daily  . insulin aspart  0-9 Units Subcutaneous TID WC  . loratadine  10 mg Oral Daily  . methimazole  5 mg Oral Daily  . metoprolol tartrate  75 mg Oral BID  . mometasone-formoterol  2 puff Inhalation BID  . pantoprazole  40 mg Oral BID  . potassium chloride  40 mEq Oral Once  . QUEtiapine   12.5 mg Oral QHS  . sodium chloride  3 mL Intravenous Q12H  . tiotropium  18 mcg Inhalation Daily  . vancomycin  1,250 mg Intravenous Q24H  . warfarin  3 mg Oral Once per day on Sun Mon Tue Thu Fri Sat  . warfarin  4 mg Oral Q Wed-1800  . Warfarin - Pharmacist Dosing Inpatient   Does not apply [phone]  Time spent on care of this patient: 35 mins   [facility] T, MD  [facility] Office  [phone] Pager - Text Page per Shea Evans as per below:  On-Call/Text Page:      [email or web address]      password [email or web address]  If 7PM-7AM, please contact night-coverage [email or web address] Password [email or web address] 02/18/2014, 1:11 PM   LOS: 4 days

## 2014-02-18 NOTE — Evaluation (Signed)
Occupational Therapy Evaluation Patient Details Name: Colleen [name] MRN: [id number] DOB: 30-Dec-1951 Today's Date: 02/18/2014 Time: 2725-3664 OT Time Calculation (min): 34 min  OT Assessment / Plan / Recommendation History of present illness Pt admit with afib with RVR   Clinical Impression   Pt admitted with above. She demonstrates the below listed deficits and will benefit from continued OT to maximize safety and independence with BADLs.  Pt currently requires min guard assist for BADLs.  She is demonstrates impaired balance, intermittent confusion, and decreased activity tolerance.  HR to 138 with activity and ambulation but returned to 89 quickly, once seated.      OT Assessment  Patient needs continued OT Services    Follow Up Recommendations  No OT follow up;Supervision/Assistance - 24 hour    Barriers to Discharge      Equipment Recommendations  None recommended by OT    Recommendations for Other Services    Frequency  Min 2X/week    Precautions / Restrictions Precautions Precautions: Fall Restrictions Weight Bearing Restrictions: No   Pertinent Vitals/Pain     ADL  Eating/Feeding: Independent Where Assessed - Eating/Feeding: Chair Grooming: Wash/dry hands;Wash/dry face;Teeth care;Brushing hair;Min guard Where Assessed - Grooming: Unsupported standing Upper Body Bathing: Min guard Where Assessed - Upper Body Bathing: Unsupported standing Lower Body Bathing: Min guard Where Assessed - Lower Body Bathing: Unsupported standing Upper Body Dressing: Set up Where Assessed - Upper Body Dressing: Unsupported sitting Lower Body Dressing: Min guard Where Assessed - Lower Body Dressing: Unsupported sit to stand Toilet Transfer: Min Psychiatric nurse Method: Sit to stand;Stand pivot Science writer: Comfort height toilet Toileting - Water quality scientist and Hygiene: Min guard Where Assessed - Toileting Clothing Manipulation and Hygiene:  Standing Transfers/Ambulation Related to ADLs: min guard assist ADL Comments: Pt able to simulate shower in standing.  Pt ambulated ~400' with min guard with HR up to 138, but quickly decreased to 89 at rest.  Dtr present and is very supportive.  Encouraged pt to perform ADLs with supervision - min guard    OT Diagnosis: Generalized weakness  OT Problem List: Decreased strength;Decreased activity tolerance;Impaired balance (sitting and/or standing);Decreased safety awareness;Decreased knowledge of use of DME or AE;Cardiopulmonary status limiting activity OT Treatment Interventions: Self-care/ADL training;DME and/or AE instruction;Therapeutic activities;Therapeutic exercise;Patient/family education;Balance training   OT Goals(Current goals can be found in the care plan section) Acute Rehab OT Goals Patient Stated Goal: to go home OT Goal Formulation: With patient Time For Goal Achievement: 02/25/14 Potential to Achieve Goals: Good ADL Goals Pt Will Perform Grooming: with supervision;standing Pt Will Perform Upper Body Bathing: with supervision;standing Pt Will Perform Lower Body Bathing: with supervision;sit to/from stand Pt Will Perform Upper Body Dressing: with supervision;sitting Pt Will Perform Lower Body Dressing: with supervision;sit to/from stand Pt Will Transfer to Toilet: with supervision;ambulating;regular height toilet;grab bars Pt Will Perform Toileting - Clothing Manipulation and hygiene: with supervision;sit to/from stand Pt/caregiver will Perform Home Exercise Program: Increased strength;Both right and left upper extremity;With theraband;With written HEP provided  Visit Information  Last OT Received On: 02/18/14 Assistance Needed: +1 History of Present Illness: Pt admit with afib with RVR       Prior [place] expects to be discharged to:: Private residence Living Arrangements: Children;Other relatives Available Help at Discharge:  Family;Available 24 hours/day;Personal care attendant Type of Home: House Home Access: Stairs to enter [employer] of Steps: 4 Entrance Stairs-Rails: None Home Layout: One level Home Equipment: Walker - 4  wheels;Cane - single point;Shower seat - built in (2Lhome O2) Prior Function Level of Independence: Independent Comments: uses 02 2L at home Communication Communication: No difficulties Dominant Hand: Right         Vision/Perception     Cognition  Cognition Arousal/Alertness: Awake/alert Behavior During Therapy: WFL for tasks assessed/performed Overall Cognitive Status: Impaired/Different from baseline General Comments: Pt with intermittent confusion     Extremity/Trunk Assessment Upper Extremity Assessment Upper Extremity Assessment: Generalized weakness Lower Extremity Assessment Lower Extremity Assessment: Defer to PT evaluation Cervical / Trunk Assessment Cervical / Trunk Assessment: Normal     Mobility Bed Mobility Overal bed mobility: Needs Assistance Bed Mobility: Supine to Sit Supine to sit: Supervision Transfers Overall transfer level: Needs assistance Equipment used: None Transfers: Sit to/from [facility] Sit to Stand: Min guard Stand pivot transfers: Min guard General transfer comment: mildly unsteady     Exercise     Balance Balance Overall balance assessment: Needs assistance Sitting-balance support: Feet supported Sitting balance-Leahy Scale: Good Standing balance support: No upper extremity supported Standing balance-Leahy Scale: Good Standing balance comment: simulated showering in standing    End of Session OT - End of Session Equipment Utilized During Treatment: Oxygen Activity Tolerance: Patient tolerated treatment well Patient left: in bed;with call bell/phone within reach;with family/visitor present Nurse Communication: Mobility status  GO     Sitlali Koerner M 02/18/2014, 5:30 PM

## 2014-02-19 LAB — PROTIME-INR
INR: 2.62 — AB (ref 0.00–1.49)
Prothrombin Time: 27.1 seconds — ABNORMAL HIGH (ref 11.6–15.2)

## 2014-02-19 LAB — BASIC METABOLIC PANEL
BUN: 13 mg/dL (ref 6–23)
CALCIUM: 8.5 mg/dL (ref 8.4–10.5)
CO2: 26 mEq/L (ref 19–32)
CREATININE: 0.86 mg/dL (ref 0.50–1.10)
Chloride: 97 mEq/L (ref 96–112)
GFR calc non Af Amer: 70 mL/min — ABNORMAL LOW (ref >90)
GFR, EST AFRICAN AMERICAN: 82 mL/min — AB (ref >90)
Glucose, Bld: 136 mg/dL — ABNORMAL HIGH (ref 70–99)
Potassium: 4.5 mEq/L (ref 3.7–5.3)
Sodium: 136 mEq/L — ABNORMAL LOW (ref 137–147)

## 2014-02-19 LAB — CBC
HCT: 34.4 % — ABNORMAL LOW (ref 36.0–46.0)
Hemoglobin: 9.5 g/dL — ABNORMAL LOW (ref 12.0–15.0)
MCH: 21.1 pg — ABNORMAL LOW (ref 26.0–34.0)
MCHC: 27.6 g/dL — AB (ref 30.0–36.0)
MCV: 76.3 fL — ABNORMAL LOW (ref 78.0–100.0)
Platelets: 222 10*3/uL (ref 150–400)
RBC: 4.51 MIL/uL (ref 3.87–5.11)
RDW: 20.4 % — AB (ref 11.5–15.5)
WBC: 8.5 10*3/uL (ref 4.0–10.5)

## 2014-02-19 LAB — GLUCOSE, CAPILLARY
GLUCOSE-CAPILLARY: 220 mg/dL — AB (ref 70–99)
Glucose-Capillary: 92 mg/dL (ref 70–99)

## 2014-02-19 MED ORDER — DIGOXIN 250 MCG PO TABS: 0.2500 mg | ORAL_TABLET | Freq: Every day | ORAL | Status: DC

## 2014-02-19 MED ORDER — DILTIAZEM HCL ER COATED BEADS 360 MG PO CP24: 360.0000 mg | ORAL_CAPSULE | Freq: Every day | ORAL | Status: DC

## 2014-02-19 MED ORDER — LEVALBUTEROL HCL 0.63 MG/3ML IN NEBU: 0.6300 mg | INHALATION_SOLUTION | RESPIRATORY_TRACT | Status: DC | PRN

## 2014-02-19 MED ORDER — METOPROLOL TARTRATE 25 MG PO TABS: 75.0000 mg | ORAL_TABLET | Freq: Two times a day (BID) | ORAL | Status: AC

## 2014-02-19 NOTE — Discharge Instructions (Signed)
Continue to wear your home oxygen at 2LPM at all times.   No driving for 6 months due to the fact that you passed out.   Make sure you schedule a blood check with your regular doctor in 2 days, and a full follow-up visit with exam in 3-5 days.   Atrial Fibrillation Atrial fibrillation is a type of irregular heart rhythm (arrhythmia). During atrial fibrillation, the upper chambers of the heart (atria) quiver continuously in a chaotic pattern. This causes an irregular and often rapid heart rate.  Atrial fibrillation is the result of the heart becoming overloaded with disorganized signals that tell it to beat. These signals are normally released one at a time by a part of the right atrium called the sinoatrial node. They then travel from the atria to the lower chambers of the heart (ventricles), causing the atria and ventricles to contract and pump blood as they pass. In atrial fibrillation, parts of the atria outside of the sinoatrial node also release these signals. This results in two problems. First, the atria receive so many signals that they do not have time to fully contract. Second, the ventricles, which can only receive one signal at a time, beat irregularly and out of rhythm with the atria.  There are three types of atrial fibrillation:   Paroxysmal Paroxysmal atrial fibrillation starts suddenly and stops on its own within a week.   Persistent Persistent atrial fibrillation lasts for more than a week. It may stop on its own or with treatment.   Permanent Permanent atrial fibrillation does not go away. Episodes of atrial fibrillation may lead to permanent atrial fibrillation.  Atrial fibrillation can prevent your heart from pumping blood normally. It increases your risk of stroke and can lead to heart failure.  CAUSES   Heart conditions, including a heart attack, heart failure, coronary artery disease, and heart valve conditions.   Inflammation of the sac that surrounds the heart  (pericarditis).   Blockage of an artery in the lungs (pulmonary embolism).   Pneumonia or other infections.   Chronic lung disease.   Thyroid problems, especially if the thyroid is overactive (hyperthyroidism).   Caffeine, excessive alcohol use, and use of some illegal drugs.   Use of some medications, including certain decongestants and diet pills.   Heart surgery.   Birth defects.  Sometimes, no cause can be found. When this happens, the atrial fibrillation is called lone atrial fibrillation. The risk of complications from atrial fibrillation increases if you have lone atrial fibrillation and you are age 66 years or older. RISK FACTORS  Heart failure.  Coronary artery disease  Diabetes mellitus.   High blood pressure (hypertension).   Obesity.   Other arrhythmias.   Increased age. SYMPTOMS   A feeling that your heart is beating rapidly or irregularly.   A feeling of discomfort or pain in your chest.   Shortness of breath.   Sudden lightheadedness or weakness.   Getting tired easily when exercising.   Urinating more often than normal (mainly when atrial fibrillation first begins).  In paroxysmal atrial fibrillation, symptoms may start and suddenly stop. DIAGNOSIS  Your caregiver may be able to detect atrial fibrillation when taking your pulse. Usually, testing is needed to diagnosis atrial fibrillation. Tests may include:   Electrocardiography. During this test, the electrical impulses of your heart are recorded while you are lying down.   Echocardiography. During echocardiography, sound waves are used to evaluate how blood flows through your heart.   Stress test.  There is more than one type of stress test. If a stress test is needed, ask your caregiver about which type is best for you.   Chest X-ray exam.   Blood tests.   Computed tomography (CT).  TREATMENT   Treating any underlying conditions. For example, if you have an  overactive thyroid, treating the condition may correct atrial fibrillation.   Medication. Medications may be given to control a rapid heart rate or to prevent blood clots, heart failure, or a stroke.   Procedure to correct the rhythm of the heart:  Electrical cardioversion. During electrical cardioversion, a controlled, low-energy shock is delivered to the heart through your skin. If you have chest pain, very low pressure blood pressure, or sudden heart failure, this procedure may need to be done as an emergency.  Catheter ablation. During this procedure, heart tissues that send the signals that cause atrial fibrillation are destroyed.  Maze or minimaze procedure. During this surgery, thin lines of heart tissue that carry the abnormal signals are destroyed. The maze procedure is an open-heart surgery. The minimaze procedure is a minimally invasive surgery. This means that small cuts are made to access the heart instead of a large opening.  Pulmonary venous isolation. During this surgery, tissue around the veins that carry blood from the lungs (pulmonary veins) is destroyed. This tissue is thought to carry the abnormal signals. HOME CARE INSTRUCTIONS   Take medications as directed by your caregiver.  Only take medications that your caregiver approves. Some medications can make atrial fibrillation worse or recur.  If blood thinners were prescribed by your caregiver, take them exactly as directed. Too much can cause bleeding. Too little and you will not have the needed protection against stroke and other problems.  Perform blood tests at home if directed by your caregiver.  Perform blood tests exactly as directed.   Quit smoking if you smoke.   Do not drink alcohol.   Do not drink caffeinated beverages such as coffee, soda, and some teas. You may drink decaffeinated coffee, soda, or tea.   Maintain a healthy weight. Do not use diet pills unless your caregiver approves. They may  make heart problems worse.   Follow diet instructions as directed by your caregiver.   Exercise regularly as directed by your caregiver.   Keep all follow-up appointments. PREVENTION  The following substances can cause atrial fibrillation to recur:   Caffeinated beverages.   Alcohol.   Certain medications, especially those used for breathing problems.   Certain herbs and herbal medications, such as those containing ephedra or ginseng.  Illegal drugs such as cocaine and amphetamines. Sometimes medications are given to prevent atrial fibrillation from recurring. Proper treatment of any underlying condition is also important in helping prevent recurrence.  SEEK MEDICAL CARE IF:  You notice a change in the rate, rhythm, or strength of your heartbeat.   You suddenly begin urinating more frequently.   You tire more easily when exerting yourself or exercising.  SEEK IMMEDIATE MEDICAL CARE IF:   You develop chest pain, abdominal pain, sweating, or weakness.  You feel sick to your stomach (nauseous).  You develop shortness of breath.  You suddenly develop swollen feet and ankles.  You feel dizzy.  You face or limbs feel numb or weak.  There is a change in your vision or speech. MAKE SURE YOU:   Understand these instructions.  Will watch your condition.  Will get help right away if you are not doing well or get  worse. Document Released: 12/16/2005 Document Revised: 04/12/2013 Document Reviewed: 01/26/2013 [facility] Patient Information 2014 [place].

## 2014-02-19 NOTE — Progress Notes (Signed)
ANTICOAGULATIONCONSULT NOTE - Follow Up Consult  Pharmacy Consult for Coumadin  Indication: atrial fibrillation   Allergies  Allergen Reactions  . Crestor [Rosuvastatin]   . Latex Hives  . Wellbutrin [Bupropion]     Patient Measurements: Height: 5\' 6"  (167.6 cm) Weight: 194 lb 7.1 oz (88.2 kg) IBW/kg (Calculated) : 59.3  Vital Signs: Temp: 97.9 F (36.6 C) (02/21 0800) Temp src: Oral (02/21 0800) BP: 103/72 mmHg (02/21 0800) Pulse Rate: 115 (02/21 0800)  Labs:  Recent Labs  02/17/14 0305 02/18/14 0240 02/19/14 0328  HGB 11.2*  --  9.5*  HCT 38.6  --  34.4*  PLT 145*  --  222  LABPROT 24.4* 25.8* 27.1*  INR 2.28* 2.45* 2.62*  CREATININE 0.88 0.80 0.86    Estimated Creatinine Clearance: 74.9 ml/min (by C-G formula based on Cr of 0.86).  Assessment:  Patient is on chronic coumadin anticoagulation for atrial fibrillation. INR remains therapeutic (2.62 today) on home dose of  3 mg daily except 4 mg on Wednesdays.  Hgb 9.5, pltc 222. No bleeding issues noted  Goal of Therapy:  INR 2-3 Monitor platelets by anticoagulation protocol: Yes   Plan:  1. Continue home coumadin dose of 3mg  daily except for 4mg  on Wed 2. Daily INR - if INR continues to be stable, consider change to INR M/W/F   Sharada Albornoz C. Tamecka Milham, PharmD Clinical Pharmacist-Resident Pager: [phone] Pharmacy: [phone] 02/19/2014 8:12 AM

## 2014-02-19 NOTE — Progress Notes (Signed)
Patient Name: Colleen [name] Date of Encounter: 02/19/2014  Principal Problem:   Atrial fibrillation with RVR Active Problems:   HYPERLIPIDEMIA   TOBACCO ABUSE   HYPERTENSION, BENIGN   COPD   GASTROESOPHAGEAL REFLUX DISEASE   Diabetes mellitus   Chronic anticoagulation- Coumadin   HCAP (healthcare-associated pneumonia)   Chronic respiratory failure with hypoxia   Syncope   Length of Stay: 5  SUBJECTIVE  Denies CP or dyspnea CURRENT MEDS . ALPRAZolam  0.5 mg Oral QHS  . atorvastatin  20 mg Oral QPM  . ceFEPime (MAXIPIME) IV  1 g Intravenous Q8H  . darifenacin  15 mg Oral Daily  . digoxin  0.25 mg Oral Daily  . diltiazem  360 mg Oral Daily  . DULoxetine  120 mg Oral Daily  . ferrous sulfate  325 mg Oral BID WC  . furosemide  40 mg Oral Daily  . insulin aspart  0-9 Units Subcutaneous TID WC  . loratadine  10 mg Oral Daily  . methimazole  5 mg Oral Daily  . metoprolol tartrate  75 mg Oral BID  . mometasone-formoterol  2 puff Inhalation BID  . pantoprazole  40 mg Oral BID  . QUEtiapine  12.5 mg Oral QHS  . sodium chloride  3 mL Intravenous Q12H  . tiotropium  18 mcg Inhalation Daily  . vancomycin  1,250 mg Intravenous Q24H  . warfarin  3 mg Oral Once per day on Sun Mon Tue Thu Fri Sat  . warfarin  4 mg Oral Q Wed-1800  . Warfarin - Pharmacist Dosing Inpatient   Does not apply q1800    OBJECTIVE   Intake/Output Summary (Last 24 hours) at 02/19/14 1016 Last data filed at 02/18/14 1750  Gross per 24 hour  Intake    720 ml  Output   1500 ml  Net   -780 ml   Filed Weights   02/17/14 0605 02/18/14 0700 02/19/14 0403  Weight: 190 lb 7.6 oz (86.4 kg) 188 lb 0.8 oz (85.3 kg) 194 lb 7.1 oz (88.2 kg)    PHYSICAL EXAM Filed Vitals:   02/19/14 0403 02/19/14 0800 02/19/14 0804 02/19/14 0855  BP: 105/60 103/72    Pulse: 82 115    Temp: 98 F (36.7 C) 97.9 F (36.6 C)    TempSrc: Oral Oral    Resp: 24 16    Height:      Weight: 194 lb 7.1 oz (88.2 kg)     SpO2:   89% 92% 94%   General: Alert, oriented x3, no distress  Head: normal Neck: supple Chest: diminished BS throughout Cardiovascular: irregular Abdomen: soft, no tenderness or distention, no masses Extremities: no edema Neurological: grossly nonfocal  LABS  CBC  Recent Labs  02/17/14 0305 02/19/14 0328  WBC 9.0 8.5  HGB 11.2* 9.5*  HCT 38.6 34.4*  MCV 73.8* 76.3*  PLT 145* 517   Basic Metabolic Panel  Recent Labs  02/17/14 0305 02/18/14 0240 02/19/14 0328  NA 135* 139 136*  K 3.8 3.4* 4.5  CL 93* 98 97  CO2 26 28 26   GLUCOSE 94 87 136*  BUN 15 13 13   CREATININE 0.88 0.80 0.86  CALCIUM 8.9 8.5 8.5  MG 1.7  --   --    TELE AF , ventricular rate 60-100   ASSESSMENT AND PLAN Atrial fibrillation-plan rate control and anticoagulation; continue present doses of dig, cardizem and lopressor; continue coumadin. Patient can be DCed from a cardiac standpoint and fu with me in  4 weeks. She follows with her primary care for INR checks Please call with questions. No driving for 6 months.  Kirk Ruths, MD  02/19/2014 10:16 AM

## 2014-02-19 NOTE — Progress Notes (Addendum)
   CARE MANAGEMENT NOTE 02/19/2014  Patient:  Colleen [name], Colleen [name]   Account Number:  [id number]  Date Initiated:  02/18/2014  Documentation initiated by:  [facility]  Subjective/Objective Assessment:   Admitted with Afib     Action/Plan:   discharge planning   Anticipated DC Date:  02/19/2014   Anticipated DC Plan:  [place] referral  Clinical Social Worker      DC Planning Services  CM consult      [facility] Choice  HOME HEALTH   Choice offered to / List presented to:  C-1 Patient        [place] arranged  HH-2 PT      [place].   Status of service:  In process, will continue to follow Medicare Important Message given?   (If response is "NO", the following Medicare IM given date fields will be blank) Date Medicare IM given:   Date Additional Medicare IM given:    Discharge Disposition:  [place]  Per UR Regulation:  Reviewed for med. necessity/level of care/duration of stay  If discussed at [place] of Stay Meetings, dates discussed:    Comments:  02/19/14 14:40 CM received call from [facility] stating pt's HHPT would not be covered by her insurance.  Pt does not want to pay out of pocket.  RN to notify physician.  No other CM needs were communicated.  Mariane Masters, BSN, [place] [facility] [phone]. 02/19/14 14:15 CM spoke with pt in room to offer choice for HHPT.  Pt chooses [facility] to render HHPT.  Address and contact number verified with pt.  Referral faxed to [facility] for HHPT. No other CM needs were communicated.  Mariane Masters, BSN, [place] [phone].  02-18-14 2:50pm Luz Lex, [place] [phone] PT recommending [place] with 24 supervision.  From prior conversation states she does not have this.  SW consult placed. Per SW,  daughter in room with pateint and confirmed will be with her 24/7.  Daughter and patient do not want SNF. CM will continue to follow.  02-17-14 11:30am [place] [facility] last night - nurse states  very unsteady on feet. Suggested need for PT.  Nurse will call physician.  02-16-14 11:50am Luz Lex, RNBSN [phone] Talked with paitent.  Sitting up in bed - awake and alert. States lives at home with daughter.  Has aide in 6.5 hours per day.  States daughter is not home with her all the time.  States she feels living situtation prior to coming in was fine.  Denies needing anything else.

## 2014-02-19 NOTE — Discharge Summary (Signed)
DISCHARGE SUMMARY  Colleen [name]  MR#: [id number]  DOB:[birth year]  Date of Admission: 02/14/2014 Date of Discharge: 02/19/2014  Attending Physician:Hillis Mcphatter T  Patient's JGG:EZMO,QHUTML F, NP  Consults: Cardiology  EP  Disposition: D/C home   Follow-up Appts:     Follow-up Information   Follow up with [name], NP. Schedule an appointment as soon as possible for a visit in 2 days. (make an appointment to have your INR (blood thinner) checked on Monday, February 23)    Contact information:   [place] [address] [phone]       Schedule an appointment as soon as possible for a visit with [name], NP. (make an appointment for a clinic vist in 3-5 days )    Contact information:   [place] [address] [phone]      Tests Needing Follow-up: -) INR check will be needed in 2-3 days  -) recheck of CBGs is suggested in f/u  -) recheck of K+ should be accomplished in 2-3 days as pt was hypokalemic during admit   Discharge Diagnoses: Atrial fibrillation with RVR  Syncope  [facility]-associated pneumonia  COPD - Chronic respiratory failure with hypoxia in current smoker  Toxic metabolic enceph / acute delirium  Diabetes mellitus  Chronic anticoagulation  HTN  Hypokalemia  Chronic anemia   Initial presentation: 63 y.o. female admitted on 02/14/2014 with history of Hypertension; Hiatal hernia; Adenomatous polyp of colon; GERD; COPD; IBS; Urinary incontinence; Esophagitis; Chronic gastritis; Duodenitis without hemorrhage (10/01/2001); Diverticulosis; Anemia; Hyperlipidemia; Decreased peripheral vision of right eye; Hyperthyroidism; Type II diabetes mellitus; Stroke; Arthritis; Depression; On home oxygen therapy; PAF and Atrial flutter who presented with passing out spells, and altered mental status. She stated that she passed out 3 times over the preceding week. She could not recall the events around the episode but  did sustain a fall and a bruise on her left elbow. She denied head injury. In the ED, patient found to be in A. fib with RVR.   Hospital Course:  Atrial fibrillation with RVR  - failed DCCV 01/06/14  - EP evaluated - Flecainide d/c'd and Cardizem adjusted - medical management only due to comorbidities, w/ focus on rate control  - rate control improved in general - follow w/ ambulation  - coumadin per Pharmacy   Syncope  - likely due to rapid A-fib / arrythmia  - CT head negative  - Myoview 2/17 revealed a small fixed defect w/ no ischemia - per Cards "cardiac cath not indicated in the absence of angina"  - no driving for 6 mo  - no recurrence during hospital stay  - going to live w/ daughter post d/c where she will have assistance   [facility]-associated pneumonia  - urine strep and legionella negative  - has completed a 5 day course of broad spectrum abx - will stop abx now  - clinically improving steadily   COPD - Chronic respiratory failure with hypoxia in current smoker  - appears stable at this time  - on home O2 at 2LPM but admits to only wearing it off and on - encouraged her to comply w/ 2lpm at all times   Toxic metabolic enceph / acute delirium  - ABG, F63, and folic acid not remarkable - possible sundowning v/s hypoxia related - much improved at time of d/c   Diabetes mellitus  - SSI - Metformin on hold during hospital stay - intake improving - resume metformin after  d/c   Chronic anticoagulation  - Coumadin per Pharmacy - INR therapeutic - continue usual home dosing and f/u regimen   HTN  - stable   Hypokalemia  - goal is to keep K+ 4.0 - replaced - Mg ok at 1.7   Chronic anemia  - stable     Medication List    STOP taking these medications       albuterol (2.5 MG/3ML) 0.083% nebulizer solution  Commonly known as:  PROVENTIL     flecainide 100 MG tablet  Commonly known as:  TAMBOCOR     metoprolol succinate 25 MG 24 hr tablet  Commonly known as:   TOPROL-XL      TAKE these medications       ADVAIR DISKUS 250-50 MCG/DOSE Aepb  Generic drug:  Fluticasone-Salmeterol  Inhale 1 puff into the lungs 2 (two) times daily.     ALPRAZolam 0.5 MG tablet  Commonly known as:  XANAX  Take 0.5 mg by mouth at bedtime.     atorvastatin 20 MG tablet  Commonly known as:  LIPITOR  Take 1 tablet (20 mg total) by mouth every evening.     cetirizine 10 MG tablet  Commonly known as:  ZYRTEC  Take 10 mg by mouth at bedtime.     digoxin 0.25 MG tablet  Commonly known as:  LANOXIN  Take 1 tablet (0.25 mg total) by mouth daily.     diltiazem 360 MG 24 hr capsule  Commonly known as:  CARDIZEM CD  Take 1 capsule (360 mg total) by mouth daily.     DULoxetine 60 MG capsule  Commonly known as:  CYMBALTA  Take 120 mg by mouth daily.     ferrous sulfate 325 (65 FE) MG tablet  Take 325 mg by mouth 2 (two) times daily with a meal.     furosemide 40 MG tablet  Commonly known as:  LASIX  Take 40 mg by mouth daily.     HYDROcodone-acetaminophen 7.5-325 MG per tablet  Commonly known as:  NORCO  Take 1 tablet by mouth every 6 (six) hours as needed for moderate pain.     levalbuterol 0.63 MG/3ML nebulizer solution  Commonly known as:  XOPENEX  Take 3 mLs (0.63 mg total) by nebulization every 3 (three) hours as needed for wheezing or shortness of breath.     levalbuterol 45 MCG/ACT inhaler  Commonly known as:  XOPENEX HFA  Inhale 2 puffs into the lungs every 4 (four) hours as needed for wheezing or shortness of breath.     metFORMIN 1000 MG (MOD) 24 hr tablet  Commonly known as:  GLUMETZA  Take 1,000 mg by mouth 2 (two) times daily with a meal.     methimazole 5 MG tablet  Commonly known as:  TAPAZOLE  Take 5 mg by mouth daily.     metoprolol tartrate 25 MG tablet  Commonly known as:  LOPRESSOR  Take 3 tablets (75 mg total) by mouth 2 (two) times daily.     omeprazole 40 MG capsule  Commonly known as:  PRILOSEC  Take 40 mg by mouth 2  (two) times daily.     solifenacin 10 MG tablet  Commonly known as:  VESICARE  Take 10 mg by mouth daily.     tiotropium 18 MCG inhalation capsule  Commonly known as:  SPIRIVA  Place 18 mcg into inhaler and inhale daily.     trimethoprim 100 MG tablet  Commonly known as:  TRIMPEX  Take 100 mg by mouth at bedtime.     warfarin 3 MG tablet  Commonly known as:  COUMADIN  Take 1 tablet (3 mg total) by mouth See admin instructions. No Coumadin today (01/06/14). Tomorrow, start 3mg  every day except Wednesdays. On Wednesdays, take 4mg .     warfarin 4 MG tablet  Commonly known as:  COUMADIN  Take 1 tablet (4 mg total) by mouth See admin instructions. No Coumadin today (01/06/14). Tomorrow, start 3mg  every day except Wednesdays. On Wednesdays, take 4mg .        Continue Home Oxygen at 2LPM at all times   Day of Discharge BP 103/72  Pulse 115  Temp(Src) 97.9 F (36.6 C) (Oral)  Resp 16  Ht 5\' 6"  (1.676 m)  Wt 88.2 kg (194 lb 7.1 oz)  BMI 31.40 kg/m2  SpO2 94%  Physical Exam: General: No acute respiratory distress in bed Lungs: crackles RLL - good air movement th/o other fields - no wheeze  Cardiovascular: Irregular rhythm - rate 90bpm - without murmur gallop or rub  Abdomen: Nontender, nondistended, soft, bowel sounds positive, no rebound, no ascites, no appreciable mass  Extremities: No significant cyanosis, clubbing, or edema bilateral lower extremities   PROTIME-INR     Status: Abnormal   Collection Time    02/19/14  3:28 AM      Result Value Ref Range   Prothrombin Time 27.1 (*) 11.6 - 15.2 seconds   INR 2.62 (*) 0.00 - 3.81  BASIC METABOLIC PANEL     Status: Abnormal   Collection Time    02/19/14  3:28 AM      Result Value Ref Range   Sodium 136 (*) 137 - 147 mEq/L   Potassium 4.5  3.7 - 5.3 mEq/L   Chloride 97  96 - 112 mEq/L   CO2 26  19 - 32 mEq/L   Glucose, Bld 136 (*) 70 - 99 mg/dL   BUN 13  6 - 23 mg/dL   Creatinine, Ser 0.86  0.50 - 1.10 mg/dL   Calcium 8.5   8.4 - 10.5 mg/dL   GFR calc non Af Amer 70 (*) >90 mL/min   GFR calc Af Amer 82 (*) >90 mL/min  CBC     Status: Abnormal   Collection Time    02/19/14  3:28 AM      Result Value Ref Range   WBC 8.5  4.0 - 10.5 K/uL   RBC 4.51  3.87 - 5.11 MIL/uL   Hemoglobin 9.5 (*) 12.0 - 15.0 g/dL   HCT 34.4 (*) 36.0 - 46.0 %   MCV 76.3 (*) 78.0 - 100.0 fL   MCH 21.1 (*) 26.0 - 34.0 pg   MCHC 27.6 (*) 30.0 - 36.0 g/dL   RDW 20.4 (*) 11.5 - 15.5 %   Platelets 222  150 - 400 K/uL   Time spent in discharge (includes decision making & examination of pt): >30 minutes  02/19/2014, 1:50 PM   Cherene Altes, MD [facility] Office  [phone] Pager [phone]  On-Call/Text Page:      [email or web address]      password [facility]

## 2014-02-22 ENCOUNTER — Ambulatory Visit: Payer: Medicaid Other | Admitting: Cardiology

## 2014-02-23 ENCOUNTER — Encounter: Payer: Medicaid Other | Admitting: Physician Assistant

## 2014-03-02 ENCOUNTER — Encounter: Payer: Medicaid Other | Admitting: Physician Assistant

## 2014-03-02 ENCOUNTER — Encounter: Payer: Self-pay | Admitting: Cardiology

## 2014-03-03 ENCOUNTER — Telehealth: Payer: Self-pay | Admitting: Cardiology

## 2014-03-03 ENCOUNTER — Other Ambulatory Visit: Payer: Self-pay | Admitting: *Deleted

## 2014-03-03 DIAGNOSIS — R7889 Finding of other specified substances, not normally found in blood: Secondary | ICD-10-CM

## 2014-03-03 MED ORDER — DIGOXIN 250 MCG PO TABS
0.1250 mg | ORAL_TABLET | Freq: Every day | ORAL | Status: DC
Start: 2014-03-03 — End: 2014-04-14

## 2014-03-03 NOTE — Telephone Encounter (Signed)
Spoke with randy, Aware of dr Jacalyn Lefevre recommendations. See results note on labs

## 2014-03-03 NOTE — Telephone Encounter (Signed)
PCP   Louie Casa MA called for [place], [place] @ [employer]. #[phone] would like a call back for a update on plan of care for this pt please.

## 2014-03-03 NOTE — Telephone Encounter (Signed)
Spoke with pt dtr, aware we have received the blood work from [facility]. Her digoxin level is high. dtr made aware holding the digoxin is the best thing to do. Aware will discuss with dr Stanford Breed when he gets to the office today and let them know what else we need to do. Pt dtr agreed with this plan.

## 2014-03-03 NOTE — Telephone Encounter (Signed)
New message ° ° ° ° ° ° ° ° °Pt has questions about her medications °

## 2014-03-10 ENCOUNTER — Other Ambulatory Visit: Payer: Self-pay | Admitting: Cardiovascular Disease

## 2014-03-10 NOTE — Telephone Encounter (Signed)
Colleen [name], I dont see where this patient was ever on potassium. Can you please look at this? Thanks, MI

## 2014-03-14 ENCOUNTER — Encounter: Payer: Medicaid Other | Admitting: Cardiology

## 2014-03-14 NOTE — Progress Notes (Signed)
HPI: followup atrial fibrillation. Echocardiogram in December of 2014 showed normal LV function, mild left ventricular hypertrophy and grade 2 diastolic dysfunction. Nuclear study in February of 2015 showed an ejection fraction of 51%. There was a fixed defect in the apex and anteroseptal region but no ischemia. TSH in January of 2015 normal. Patient has a history of paroxysmal atrial fibrillation. She was placed on flecainide and underwent cardioversion but atrial fibrillation recurred. Admitted in February of 2015 with syncope. Seen by Dr Rayann Heman. During that admission it was decided to treat her atrial fibrillation with rate control and anticoagulation. Flecainide was discontinued. Patient instructed not to drive for 6 months. Note she had a microcytic anemia during that admission. Since discharge,   Current Outpatient Prescriptions  Medication Sig Dispense Refill  . ALPRAZolam (XANAX) 0.5 MG tablet Take 0.5 mg by mouth at bedtime.       [name] atorvastatin (LIPITOR) 20 MG tablet Take 1 tablet (20 mg total) by mouth every evening.  30 tablet  6  . cetirizine (ZYRTEC) 10 MG tablet Take 10 mg by mouth at bedtime.       . digoxin (LANOXIN) 0.25 MG tablet Take 0.5 tablets (0.125 mg total) by mouth daily.  30 tablet  0  . diltiazem (CARDIZEM CD) 360 MG 24 hr capsule Take 1 capsule (360 mg total) by mouth daily.  30 capsule  0  . DULoxetine (CYMBALTA) 60 MG capsule Take 120 mg by mouth daily.      . ferrous sulfate 325 (65 FE) MG tablet Take 325 mg by mouth 2 (two) times daily with a meal.      . Fluticasone-Salmeterol (ADVAIR DISKUS) 250-50 MCG/DOSE AEPB Inhale 1 puff into the lungs 2 (two) times daily.       . furosemide (LASIX) 40 MG tablet Take 40 mg by mouth daily.       [name] HYDROcodone-acetaminophen (NORCO) 7.5-325 MG per tablet Take 1 tablet by mouth every 6 (six) hours as needed for moderate pain.      [name] levalbuterol (XOPENEX HFA) 45 MCG/ACT inhaler Inhale 2 puffs into the lungs every 4 (four)  hours as needed for wheezing or shortness of breath.      . levalbuterol (XOPENEX) 0.63 MG/3ML nebulizer solution Take 3 mLs (0.63 mg total) by nebulization every 3 (three) hours as needed for wheezing or shortness of breath.  3 mL  0  . metFORMIN (GLUMETZA) 1000 MG (MOD) 24 hr tablet Take 1,000 mg by mouth 2 (two) times daily with a meal.      . methimazole (TAPAZOLE) 5 MG tablet Take 5 mg by mouth daily.      . metoprolol tartrate (LOPRESSOR) 25 MG tablet Take 3 tablets (75 mg total) by mouth 2 (two) times daily.  360 tablet  0  . omeprazole (PRILOSEC) 40 MG capsule Take 40 mg by mouth 2 (two) times daily.       . solifenacin (VESICARE) 10 MG tablet Take 10 mg by mouth daily.      [name] tiotropium (SPIRIVA) 18 MCG inhalation capsule Place 18 mcg into inhaler and inhale daily.       [name] trimethoprim (TRIMPEX) 100 MG tablet Take 100 mg by mouth at bedtime.      [name] warfarin (COUMADIN) 3 MG tablet Take 1 tablet (3 mg total) by mouth See admin instructions. No Coumadin today (01/06/14). Tomorrow, start 3mg  every day except Wednesdays. On Wednesdays, take 4mg .      . warfarin (  COUMADIN) 4 MG tablet Take 1 tablet (4 mg total) by mouth See admin instructions. No Coumadin today (01/06/14). Tomorrow, start 3mg  every day except Wednesdays. On Wednesdays, take 4mg .       No current facility-administered medications for this visit.     Past Medical History  Diagnosis Date  . Hypertension   . Hiatal hernia   . Adenomatous polyp of colon   . Acute asthmatic bronchitis   . GERD (gastroesophageal reflux disease)     EGD neg 03/07/2008, but prev required dilation  . COPD (chronic obstructive pulmonary disease)   . IBS (irritable bowel syndrome)     chronic  . Urinary incontinence   . Esophagitis   . Chronic gastritis   . Duodenitis without hemorrhage 10/01/2001  . Diverticulosis   . Personal history of colonic adenomas 02/05/2008        . Anemia   . Hyperlipidemia   . Decreased peripheral vision of right eye     . Pneumonia 2011  . Hyperthyroidism   . Type II diabetes mellitus   . Stroke     a. Hx of stroke and ministrokes ~ 2009. b. TIA 11/2013, dx with AF at that time.  . Arthritis   . Depression   . On home oxygen therapy     "2L; suppose to use 24/7" (01/05/2014)  . PAF (paroxysmal atrial fibrillation)     a. Prior hx. b. Recurred 11/2013 in setting of possible TIA. Lifelong Coumadin planned. Spont converted to NSR. EF 60-65% by echo 11/2013, normal cors 2007, normal nuc 2011.   . Atrial flutter     a. Dx 12/2013. s/p DCCV. On anticoagulation.    Past Surgical History  Procedure Laterality Date  . Colonoscopy    . Upper gi endoscopy    . Cholecystectomy  1980's  . Vaginal hysterectomy  1992  . Breast biopsy Left 1987    "milk gland"  . Cardioversion N/A 01/06/2014    Procedure: CARDIOVERSION;  Surgeon: Lelon Perla, MD;  Location: [facility] OR;  Service: Cardiovascular;  Laterality: N/A;    History   Social History  . Marital Status: Married    Spouse Name: N/A    Number of Children: N/A  . Years of Education: N/A   Occupational History  . Not on file.   Social History Main Topics  . Smoking status: Current Every Day Smoker -- 1.00 packs/day for 51 years    Types: Cigarettes  . Smokeless tobacco: Never Used  . Alcohol Use: No  . Drug Use: No  . Sexual Activity: No   Other Topics Concern  . Not on file   Social History Narrative  . No narrative on file    ROS: no fevers or chills, productive cough, hemoptysis, dysphasia, odynophagia, melena, hematochezia, dysuria, hematuria, rash, seizure activity, orthopnea, PND, pedal edema, claudication. Remaining systems are negative.  Physical Exam: Well-developed well-nourished in no acute distress.  Skin is warm and dry.  HEENT is normal.  Neck is supple.  Chest is clear to auscultation with normal expansion.  Cardiovascular exam is regular rate and rhythm.  Abdominal exam nontender or distended. No masses  palpated. Extremities show no edema. neuro grossly intact  ECG     This encounter was created in error - please disregard.

## 2014-03-15 NOTE — Telephone Encounter (Signed)
Dr Stanford Breed, one of the d/c dx in hospital was hypokalemia but dont see it on discharge list or med profile, K 4.5 on 02/19/14, do you want me to refill script sent from pharmacy. Thanks, Lovett Sox, RN Patient Care Advocate

## 2014-03-16 ENCOUNTER — Encounter: Payer: Self-pay | Admitting: Cardiology

## 2014-03-17 ENCOUNTER — Other Ambulatory Visit: Payer: Medicaid Other

## 2014-03-18 ENCOUNTER — Emergency Department (HOSPITAL_COMMUNITY): Payer: Medicaid Other

## 2014-03-18 ENCOUNTER — Encounter (HOSPITAL_COMMUNITY): Payer: Self-pay | Admitting: Emergency Medicine

## 2014-03-18 ENCOUNTER — Inpatient Hospital Stay (HOSPITAL_COMMUNITY)
Admission: EM | Admit: 2014-03-18 | Discharge: 2014-03-29 | DRG: 064 | Disposition: A | Discharge status: Rehabilitation Facility | Payer: Medicaid Other | Attending: Internal Medicine | Admitting: Internal Medicine

## 2014-03-18 DIAGNOSIS — E079 Disorder of thyroid, unspecified: Secondary | ICD-10-CM

## 2014-03-18 DIAGNOSIS — R0609 Other forms of dyspnea: Secondary | ICD-10-CM | POA: Diagnosis not present

## 2014-03-18 DIAGNOSIS — J9620 Acute and chronic respiratory failure, unspecified whether with hypoxia or hypercapnia: Secondary | ICD-10-CM

## 2014-03-18 DIAGNOSIS — S0689AA Other specified intracranial injury with loss of consciousness status unknown, initial encounter: Secondary | ICD-10-CM | POA: Diagnosis present

## 2014-03-18 DIAGNOSIS — I639 Cerebral infarction, unspecified: Secondary | ICD-10-CM

## 2014-03-18 DIAGNOSIS — F17200 Nicotine dependence, unspecified, uncomplicated: Secondary | ICD-10-CM

## 2014-03-18 DIAGNOSIS — IMO0001 Reserved for inherently not codable concepts without codable children: Secondary | ICD-10-CM

## 2014-03-18 DIAGNOSIS — J9601 Acute respiratory failure with hypoxia: Secondary | ICD-10-CM

## 2014-03-18 DIAGNOSIS — Z9981 Dependence on supplemental oxygen: Secondary | ICD-10-CM

## 2014-03-18 DIAGNOSIS — E785 Hyperlipidemia, unspecified: Secondary | ICD-10-CM

## 2014-03-18 DIAGNOSIS — J449 Chronic obstructive pulmonary disease, unspecified: Secondary | ICD-10-CM

## 2014-03-18 DIAGNOSIS — I4891 Unspecified atrial fibrillation: Secondary | ICD-10-CM

## 2014-03-18 DIAGNOSIS — Z0389 Encounter for observation for other suspected diseases and conditions ruled out: Secondary | ICD-10-CM

## 2014-03-18 DIAGNOSIS — D649 Anemia, unspecified: Secondary | ICD-10-CM | POA: Diagnosis present

## 2014-03-18 DIAGNOSIS — R079 Chest pain, unspecified: Secondary | ICD-10-CM

## 2014-03-18 DIAGNOSIS — R4182 Altered mental status, unspecified: Secondary | ICD-10-CM | POA: Diagnosis present

## 2014-03-18 DIAGNOSIS — F3289 Other specified depressive episodes: Secondary | ICD-10-CM | POA: Diagnosis present

## 2014-03-18 DIAGNOSIS — R002 Palpitations: Secondary | ICD-10-CM

## 2014-03-18 DIAGNOSIS — E876 Hypokalemia: Secondary | ICD-10-CM | POA: Diagnosis present

## 2014-03-18 DIAGNOSIS — E119 Type 2 diabetes mellitus without complications: Secondary | ICD-10-CM

## 2014-03-18 DIAGNOSIS — E871 Hypo-osmolality and hyponatremia: Secondary | ICD-10-CM | POA: Diagnosis present

## 2014-03-18 DIAGNOSIS — J9611 Chronic respiratory failure with hypoxia: Secondary | ICD-10-CM

## 2014-03-18 DIAGNOSIS — R0989 Other specified symptoms and signs involving the circulatory and respiratory systems: Secondary | ICD-10-CM

## 2014-03-18 DIAGNOSIS — I6350 Cerebral infarction due to unspecified occlusion or stenosis of unspecified cerebral artery: Principal | ICD-10-CM | POA: Diagnosis present

## 2014-03-18 DIAGNOSIS — E669 Obesity, unspecified: Secondary | ICD-10-CM

## 2014-03-18 DIAGNOSIS — I519 Heart disease, unspecified: Secondary | ICD-10-CM | POA: Diagnosis present

## 2014-03-18 DIAGNOSIS — H53469 Homonymous bilateral field defects, unspecified side: Secondary | ICD-10-CM | POA: Diagnosis present

## 2014-03-18 DIAGNOSIS — K219 Gastro-esophageal reflux disease without esophagitis: Secondary | ICD-10-CM

## 2014-03-18 DIAGNOSIS — G40909 Epilepsy, unspecified, not intractable, without status epilepticus: Secondary | ICD-10-CM | POA: Diagnosis present

## 2014-03-18 DIAGNOSIS — S06369A Traumatic hemorrhage of cerebrum, unspecified, with loss of consciousness of unspecified duration, initial encounter: Secondary | ICD-10-CM

## 2014-03-18 DIAGNOSIS — Z7901 Long term (current) use of anticoagulants: Secondary | ICD-10-CM

## 2014-03-18 DIAGNOSIS — K449 Diaphragmatic hernia without obstruction or gangrene: Secondary | ICD-10-CM

## 2014-03-18 DIAGNOSIS — G40309 Generalized idiopathic epilepsy and epileptic syndromes, not intractable, without status epilepticus: Secondary | ICD-10-CM

## 2014-03-18 DIAGNOSIS — K589 Irritable bowel syndrome without diarrhea: Secondary | ICD-10-CM | POA: Diagnosis present

## 2014-03-18 DIAGNOSIS — I5189 Other ill-defined heart diseases: Secondary | ICD-10-CM

## 2014-03-18 DIAGNOSIS — R233 Spontaneous ecchymoses: Secondary | ICD-10-CM | POA: Diagnosis present

## 2014-03-18 DIAGNOSIS — E0590 Thyrotoxicosis, unspecified without thyrotoxic crisis or storm: Secondary | ICD-10-CM | POA: Diagnosis present

## 2014-03-18 DIAGNOSIS — I359 Nonrheumatic aortic valve disorder, unspecified: Secondary | ICD-10-CM

## 2014-03-18 DIAGNOSIS — Z8673 Personal history of transient ischemic attack (TIA), and cerebral infarction without residual deficits: Secondary | ICD-10-CM

## 2014-03-18 DIAGNOSIS — R0602 Shortness of breath: Secondary | ICD-10-CM

## 2014-03-18 DIAGNOSIS — Y95 Nosocomial condition: Secondary | ICD-10-CM

## 2014-03-18 DIAGNOSIS — G936 Cerebral edema: Secondary | ICD-10-CM | POA: Diagnosis present

## 2014-03-18 DIAGNOSIS — Z860100 Personal history of colon polyps, unspecified: Secondary | ICD-10-CM

## 2014-03-18 DIAGNOSIS — I4892 Unspecified atrial flutter: Secondary | ICD-10-CM

## 2014-03-18 DIAGNOSIS — F329 Major depressive disorder, single episode, unspecified: Secondary | ICD-10-CM | POA: Diagnosis present

## 2014-03-18 DIAGNOSIS — A419 Sepsis, unspecified organism: Secondary | ICD-10-CM | POA: Diagnosis not present

## 2014-03-18 DIAGNOSIS — G459 Transient cerebral ischemic attack, unspecified: Secondary | ICD-10-CM

## 2014-03-18 DIAGNOSIS — I6389 Other cerebral infarction: Secondary | ICD-10-CM

## 2014-03-18 DIAGNOSIS — J4489 Other specified chronic obstructive pulmonary disease: Secondary | ICD-10-CM

## 2014-03-18 DIAGNOSIS — I629 Nontraumatic intracranial hemorrhage, unspecified: Secondary | ICD-10-CM

## 2014-03-18 DIAGNOSIS — I619 Nontraumatic intracerebral hemorrhage, unspecified: Secondary | ICD-10-CM | POA: Diagnosis present

## 2014-03-18 DIAGNOSIS — J189 Pneumonia, unspecified organism: Secondary | ICD-10-CM

## 2014-03-18 DIAGNOSIS — Z8249 Family history of ischemic heart disease and other diseases of the circulatory system: Secondary | ICD-10-CM

## 2014-03-18 DIAGNOSIS — R1310 Dysphagia, unspecified: Secondary | ICD-10-CM

## 2014-03-18 DIAGNOSIS — R791 Abnormal coagulation profile: Secondary | ICD-10-CM | POA: Diagnosis present

## 2014-03-18 DIAGNOSIS — G8190 Hemiplegia, unspecified affecting unspecified side: Secondary | ICD-10-CM | POA: Diagnosis present

## 2014-03-18 DIAGNOSIS — M129 Arthropathy, unspecified: Secondary | ICD-10-CM | POA: Diagnosis present

## 2014-03-18 DIAGNOSIS — J209 Acute bronchitis, unspecified: Secondary | ICD-10-CM

## 2014-03-18 DIAGNOSIS — N390 Urinary tract infection, site not specified: Secondary | ICD-10-CM | POA: Diagnosis not present

## 2014-03-18 DIAGNOSIS — I10 Essential (primary) hypertension: Secondary | ICD-10-CM

## 2014-03-18 DIAGNOSIS — A4159 Other Gram-negative sepsis: Secondary | ICD-10-CM | POA: Diagnosis not present

## 2014-03-18 LAB — COMPREHENSIVE METABOLIC PANEL
ALT: 13 U/L (ref 0–35)
AST: 36 U/L (ref 0–37)
Albumin: 4 g/dL (ref 3.5–5.2)
Alkaline Phosphatase: 156 U/L — ABNORMAL HIGH (ref 39–117)
BUN: 13 mg/dL (ref 6–23)
CO2: 24 meq/L (ref 19–32)
CREATININE: 0.49 mg/dL — AB (ref 0.50–1.10)
Calcium: 10 mg/dL (ref 8.4–10.5)
Chloride: 87 mEq/L — ABNORMAL LOW (ref 96–112)
GLUCOSE: 207 mg/dL — AB (ref 70–99)
Potassium: 4.3 mEq/L (ref 3.7–5.3)
SODIUM: 133 meq/L — AB (ref 137–147)
Total Bilirubin: 0.8 mg/dL (ref 0.3–1.2)
Total Protein: 8.9 g/dL — ABNORMAL HIGH (ref 6.0–8.3)

## 2014-03-18 LAB — DIFFERENTIAL
Basophils Absolute: 0 10*3/uL (ref 0.0–0.1)
Basophils Relative: 0 % (ref 0–1)
EOS PCT: 0 % (ref 0–5)
Eosinophils Absolute: 0 10*3/uL (ref 0.0–0.7)
LYMPHS ABS: 1.2 10*3/uL (ref 0.7–4.0)
LYMPHS PCT: 5 % — AB (ref 12–46)
MONO ABS: 1 10*3/uL (ref 0.1–1.0)
Monocytes Relative: 5 % (ref 3–12)
Neutro Abs: 19.9 10*3/uL — ABNORMAL HIGH (ref 1.7–7.7)
Neutrophils Relative %: 90 % — ABNORMAL HIGH (ref 43–77)

## 2014-03-18 LAB — GLUCOSE, CAPILLARY
Glucose-Capillary: 161 mg/dL — ABNORMAL HIGH (ref 70–99)
Glucose-Capillary: 133 mg/dL — ABNORMAL HIGH (ref 70–99)

## 2014-03-18 LAB — CBC
HEMATOCRIT: 48.3 % — AB (ref 36.0–46.0)
Hemoglobin: 15 g/dL (ref 12.0–15.0)
MCH: 22.3 pg — ABNORMAL LOW (ref 26.0–34.0)
MCHC: 31.1 g/dL (ref 30.0–36.0)
MCV: 71.8 fL — ABNORMAL LOW (ref 78.0–100.0)
Platelets: 381 10*3/uL (ref 150–400)
RBC: 6.73 MIL/uL — AB (ref 3.87–5.11)
RDW: 20.3 % — ABNORMAL HIGH (ref 11.5–15.5)
WBC: 22.1 10*3/uL — ABNORMAL HIGH (ref 4.0–10.5)

## 2014-03-18 LAB — PROTIME-INR
INR: 2.61 — ABNORMAL HIGH (ref 0.00–1.49)
INR: 1.14 (ref 0.00–1.49)
INR: 1.02 (ref 0.00–1.49)
PROTHROMBIN TIME: 13.2 s (ref 11.6–15.2)
Prothrombin Time: 27 seconds — ABNORMAL HIGH (ref 11.6–15.2)
Prothrombin Time: 14.4 seconds (ref 11.6–15.2)

## 2014-03-18 LAB — I-STAT CHEM 8, ED
BUN: 15 mg/dL (ref 6–23)
CALCIUM ION: 1.08 mmol/L — AB (ref 1.13–1.30)
CHLORIDE: 94 meq/L — AB (ref 96–112)
Creatinine, Ser: 0.6 mg/dL (ref 0.50–1.10)
GLUCOSE: 200 mg/dL — AB (ref 70–99)
HCT: 54 % — ABNORMAL HIGH (ref 36.0–46.0)
Hemoglobin: 18.4 g/dL — ABNORMAL HIGH (ref 12.0–15.0)
Potassium: 4.1 mEq/L (ref 3.7–5.3)
Sodium: 133 mEq/L — ABNORMAL LOW (ref 137–147)
TCO2: 28 mmol/L (ref 0–100)

## 2014-03-18 LAB — I-STAT TROPONIN, ED: Troponin i, poc: 0 ng/mL (ref 0.00–0.08)

## 2014-03-18 LAB — CBG MONITORING, ED: Glucose-Capillary: 186 mg/dL — ABNORMAL HIGH (ref 70–99)

## 2014-03-18 LAB — APTT: aPTT: 48 seconds — ABNORMAL HIGH (ref 24–37)

## 2014-03-18 LAB — DIGOXIN LEVEL: Digoxin Level: 0.8 ng/mL (ref 0.8–2.0)

## 2014-03-18 LAB — MRSA PCR SCREENING: MRSA by PCR: NEGATIVE

## 2014-03-18 MED ORDER — TIOTROPIUM BROMIDE MONOHYDRATE 18 MCG IN CAPS
18.0000 ug | ORAL_CAPSULE | Freq: Every day | RESPIRATORY_TRACT | Status: DC
  Administered 2014-03-19 – 2014-03-20 (×2): 18 ug via RESPIRATORY_TRACT
  Filled 2014-03-18 (×2): qty 5

## 2014-03-18 MED ORDER — INSULIN ASPART 100 UNIT/ML ~~LOC~~ SOLN
2.0000 [IU] | SUBCUTANEOUS | Status: DC
  Administered 2014-03-18: 4 [IU] via SUBCUTANEOUS
  Administered 2014-03-18 – 2014-03-22 (×12): 2 [IU] via SUBCUTANEOUS
  Administered 2014-03-22: 4 [IU] via SUBCUTANEOUS
  Administered 2014-03-22 – 2014-03-23 (×2): 2 [IU] via SUBCUTANEOUS
  Administered 2014-03-23: 4 [IU] via SUBCUTANEOUS
  Administered 2014-03-23 – 2014-03-24 (×4): 2 [IU] via SUBCUTANEOUS

## 2014-03-18 MED ORDER — ACETAMINOPHEN 650 MG RE SUPP
650.0000 mg | RECTAL | Status: DC | PRN
  Administered 2014-03-23: 650 mg via RECTAL
  Filled 2014-03-18: qty 1

## 2014-03-18 MED ORDER — ACETAMINOPHEN 325 MG PO TABS
650.0000 mg | ORAL_TABLET | ORAL | Status: DC | PRN
  Filled 2014-03-18: qty 2

## 2014-03-18 MED ORDER — LABETALOL HCL 5 MG/ML IV SOLN
10.0000 mg | Freq: Once | INTRAVENOUS | Status: AC
  Administered 2014-03-18: 10 mg via INTRAVENOUS

## 2014-03-18 MED ORDER — MOMETASONE FURO-FORMOTEROL FUM 100-5 MCG/ACT IN AERO
2.0000 | INHALATION_SPRAY | Freq: Two times a day (BID) | RESPIRATORY_TRACT | Status: DC
  Administered 2014-03-19 – 2014-03-23 (×7): 2 via RESPIRATORY_TRACT
  Filled 2014-03-18 (×2): qty 8.8

## 2014-03-18 MED ORDER — PROTHROMBIN COMPLEX CONC HUMAN 500 UNITS IV KIT
1615.0000 [IU] | PACK | Status: AC
  Administered 2014-03-18: 1615 [IU] via INTRAVENOUS
  Filled 2014-03-18: qty 65

## 2014-03-18 MED ORDER — LABETALOL HCL 5 MG/ML IV SOLN
INTRAVENOUS | Status: AC
  Filled 2014-03-18: qty 4

## 2014-03-18 MED ORDER — PANTOPRAZOLE SODIUM 40 MG IV SOLR
40.0000 mg | Freq: Every day | INTRAVENOUS | Status: DC
  Administered 2014-03-18 – 2014-03-23 (×6): 40 mg via INTRAVENOUS
  Filled 2014-03-18 (×8): qty 40

## 2014-03-18 MED ORDER — DIGOXIN 125 MCG PO TABS
0.1250 mg | ORAL_TABLET | Freq: Every day | ORAL | Status: DC
  Administered 2014-03-20 – 2014-03-24 (×5): 0.125 mg via ORAL
  Filled 2014-03-18 (×7): qty 1

## 2014-03-18 MED ORDER — LABETALOL HCL 5 MG/ML IV SOLN
10.0000 mg | INTRAVENOUS | Status: DC | PRN
  Administered 2014-03-20: 40 mg via INTRAVENOUS
  Administered 2014-03-20: 20 mg via INTRAVENOUS
  Administered 2014-03-20: 40 mg via INTRAVENOUS
  Administered 2014-03-20: 10 mg via INTRAVENOUS
  Administered 2014-03-22 – 2014-03-23 (×4): 20 mg via INTRAVENOUS
  Administered 2014-03-24: 40 mg via INTRAVENOUS
  Administered 2014-03-24 (×3): 20 mg via INTRAVENOUS
  Administered 2014-03-25 (×2): 40 mg via INTRAVENOUS
  Administered 2014-03-25 – 2014-03-26 (×2): 10 mg via INTRAVENOUS
  Filled 2014-03-18: qty 8
  Filled 2014-03-18: qty 4
  Filled 2014-03-18 (×3): qty 8
  Filled 2014-03-18 (×2): qty 4
  Filled 2014-03-18 (×4): qty 8
  Filled 2014-03-18 (×4): qty 4
  Filled 2014-03-18 (×2): qty 8
  Filled 2014-03-18: qty 4

## 2014-03-18 MED ORDER — DULOXETINE HCL 60 MG PO CPEP
120.0000 mg | ORAL_CAPSULE | Freq: Every day | ORAL | Status: DC
  Administered 2014-03-22 – 2014-03-23 (×2): 120 mg via ORAL
  Filled 2014-03-18 (×7): qty 2

## 2014-03-18 MED ORDER — DEXTROSE 5 % IV SOLN
10.0000 mg | INTRAVENOUS | Status: AC
  Administered 2014-03-18: 10 mg via INTRAVENOUS
  Filled 2014-03-18: qty 1

## 2014-03-18 MED ORDER — METHIMAZOLE 5 MG PO TABS
5.0000 mg | ORAL_TABLET | Freq: Every day | ORAL | Status: DC
  Administered 2014-03-20 – 2014-03-24 (×5): 5 mg via ORAL
  Filled 2014-03-18 (×7): qty 1

## 2014-03-18 MED ORDER — LABETALOL HCL 5 MG/ML IV SOLN
0.5000 mg/min | INTRAVENOUS | Status: DC
  Administered 2014-03-18: 0.5 mg/min via INTRAVENOUS
  Administered 2014-03-18 – 2014-03-19 (×2): 1 mg/min via INTRAVENOUS
  Administered 2014-03-20: 2.5 mg/min via INTRAVENOUS
  Administered 2014-03-20 (×2): 2 mg/min via INTRAVENOUS
  Administered 2014-03-21: 2.5 mg/min via INTRAVENOUS
  Administered 2014-03-21: 1 mg/min via INTRAVENOUS
  Filled 2014-03-18 (×9): qty 100

## 2014-03-18 NOTE — H&P (Signed)
Neurology H&P  CC: worsening left sided weakness  History is obtained from:Patient  HPI: Colleen [name] is a 63 y.o. female with a history of recent stroke on Wednesday who was discharged from [facility] after having coumadin stopped for some pethechial hemorrhage associated with the hemorrhage.    LKW: 03/18  tpa given?: no, hemorrhage    ROS: A 14 point ROS was performed and is negative except as noted in the HPI.  Past Medical History  Diagnosis Date  . Hypertension   . Hiatal hernia   . Adenomatous polyp of colon   . Acute asthmatic bronchitis   . GERD (gastroesophageal reflux disease)     EGD neg 03/07/2008, but prev required dilation  . COPD (chronic obstructive pulmonary disease)   . IBS (irritable bowel syndrome)     chronic  . Urinary incontinence   . Esophagitis   . Chronic gastritis   . Duodenitis without hemorrhage 10/01/2001  . Diverticulosis   . Personal history of colonic adenomas 02/05/2008        . Anemia   . Hyperlipidemia   . Decreased peripheral vision of right eye   . Pneumonia 2011  . Hyperthyroidism   . Type II diabetes mellitus   . Stroke     a. Hx of stroke and ministrokes ~ 2009. b. TIA 11/2013, dx with AF at that time.  . Arthritis   . Depression   . On home oxygen therapy     "2L; suppose to use 24/7" (01/05/2014)  . PAF (paroxysmal atrial fibrillation)     a. Prior hx. b. Recurred 11/2013 in setting of possible TIA. Lifelong Coumadin planned. Spont converted to NSR. EF 60-65% by echo 11/2013, normal cors 2007, normal nuc 2011.   . Atrial flutter     a. Dx 12/2013. s/p DCCV. On anticoagulation.    Exam: Current vital signs: BP 168/99  Pulse 82  Temp(Src) 97.6 F (36.4 C) (Oral)  Resp 14  Wt 65.772 kg (145 lb)  SpO2 100% Vital signs in last 24 hours: Temp:  [97.6 F (36.4 C)] 97.6 F (36.4 C) (03/20 1125) Pulse Rate:  [43-96] 82 (03/20 1349) Resp:  [12-21] 14 (03/20 1349) BP: (133-213)/(51-121) 168/99 mmHg (03/20 1349) SpO2:   [95 %-100 %] 100 % (03/20 1349) Weight:  [65.772 kg (145 lb)] 65.772 kg (145 lb) (03/20 1138)  General: in bed Resp: non-laboured breathing CV: Irregular Abd: NT, ND Ext: no edema Mental Status: Patient is awake, alert, oriented to person,  month, year,  Patient is able to give a clear and coherent history. No signs of aphasia, but does have left neglect Cranial Nerves: II: L hemianopia. Pupils are equal, round, and reactive to light.  Discs are difficult to visualize. III,IV, VI: EOMI without ptosis or diploplia.  V: Facial sensation is symmetric to temperature VII: Facial movement is decreased on left VIII: hearing is intact to voice X: Uvula elevates symmetrically XI: Shoulder shrug is symmetric. XII: tongue is midline without atrophy or fasciculations.  Motor: Tone is normal. Bulk is normal. 5/5 strength was present on the right, mild 4/5 weakness on the left.  Sensory: Sensation is decreased on the left, does extinguish to DSS Deep Tendon Reflexes: 2+ and symmetric in the biceps and patellae.  Cerebellar: FNF and HKS are intact on right Gait: Not assessed due to acute nature of evaluation and multiple medical monitors in ED setting.   I have reviewed labs in epic and the results pertinent to this  consultation are: INR 2.6  I have reviewed the images obtained:CT head - left posterior hemorrhage.  MRI brain 3/18 - hermorrhagic infarct in the same area.   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 It is unclear to me how much workup for her infarct was done at [facility]. She will need strict BP control and reversal of her anticoagulation.   Recommendations: 1. HgbA1c, fasting lipid panel 3. Frequent neuro checks 4. Echocardiogram 5. Carotid dopplers 6. Prophylactic therapy-None 7. Risk factor modification 8. Telemetry monitoring 9. PT consult, OT consult, Speech consult 10. kcentra for INR 2.6  11. Labetalol drip for elevated BP.  12.  SSI for DM  This patient is critically ill and at significant risk of neurological worsening, death and care requires constant monitoring of vital signs, hemodynamics,respiratory and cardiac monitoring, neurological assessment, discussion with family, other specialists and medical decision making of high complexity. I spent 60 minutes of neurocritical care time  in the care of  this patient.  Roland Rack, MD [facility] [phone]  If 7pm- 7am, please page neurology on call at [phone].  03/18/2014  1:54 PM

## 2014-03-18 NOTE — ED Provider Notes (Signed)
CSN: [id number]     Arrival date & time 03/18/14  1055 History   First MD Initiated Contact with Patient 03/18/14 1103     Chief Complaint  Patient presents with  . Code Stroke   Level V caveat due to urgent need for intervention An emergency department physician performed an initial assessment on this suspected stroke patient at 52. (Consider location/radiation/quality/duration/timing/severity/associated sxs/prior Treatment) HPI Patient presented as a code stroke. Met initially by neurology and then by myself. Reportedly had had intracranial hemorrhage at [facility] 2 days ago and had her Coumadin stopped at that time. This morning she became worse with a headache and some confusion and difficulty moving her left side. Past Medical History  Diagnosis Date  . Hypertension   . Hiatal hernia   . Adenomatous polyp of colon   . Acute asthmatic bronchitis   . GERD (gastroesophageal reflux disease)     EGD neg 03/07/2008, but prev required dilation  . COPD (chronic obstructive pulmonary disease)   . IBS (irritable bowel syndrome)     chronic  . Urinary incontinence   . Esophagitis   . Chronic gastritis   . Duodenitis without hemorrhage 10/01/2001  . Diverticulosis   . Personal history of colonic adenomas 02/05/2008        . Anemia   . Hyperlipidemia   . Decreased peripheral vision of right eye   . Pneumonia 2011  . Hyperthyroidism   . Type II diabetes mellitus   . Stroke     a. Hx of stroke and ministrokes ~ 2009. b. TIA 11/2013, dx with AF at that time.  . Arthritis   . Depression   . On home oxygen therapy     "2L; suppose to use 24/7" (01/05/2014)  . PAF (paroxysmal atrial fibrillation)     a. Prior hx. b. Recurred 11/2013 in setting of possible TIA. Lifelong Coumadin planned. Spont converted to NSR. EF 60-65% by echo 11/2013, normal cors 2007, normal nuc 2011.   . Atrial flutter     a. Dx 12/2013. s/p DCCV. On anticoagulation.   Past Surgical History  Procedure  Laterality Date  . Colonoscopy    . Upper gi endoscopy    . Cholecystectomy  1980's  . Vaginal hysterectomy  1992  . Breast biopsy Left 1987    "milk gland"  . Cardioversion N/A 01/06/2014    Procedure: CARDIOVERSION;  Surgeon: Lelon Perla, MD;  Location: [facility] OR;  Service: Cardiovascular;  Laterality: N/A;   Family History  Problem Relation Age of Onset  . Heart disease Mother   . Asthma Father   . Lung cancer Father   . Asthma Sister   . Heart disease Sister   . Colon cancer Father    History  Substance Use Topics  . Smoking status: Current Every Day Smoker -- 1.00 packs/day for 51 years    Types: Cigarettes  . Smokeless tobacco: Never Used  . Alcohol Use: No   OB History   Grav Para Term Preterm Abortions TAB SAB Ect Mult Living                 Review of Systems  Unable to perform ROS     Allergies  Crestor; Latex; and Wellbutrin  Home Medications  No current outpatient prescriptions on file. BP 163/86  Pulse 81  Temp(Src) 97.8 F (36.6 C) (Oral)  Resp 15  Ht $R'5\' 5"'aX$  (1.651 m)  Wt 148 lb 9.4 oz (67.4 kg)  BMI 24.73 kg/m2  SpO2 99% Physical Exam  Constitutional: She appears well-developed and well-nourished.  Cardiovascular: Normal rate and regular rhythm.   Pulmonary/Chest: Effort normal and breath sounds normal.  Abdominal: Soft. There is no tenderness.  Neurological: She is alert.  Patient is able to follow commands. Weakness on left compared to right. Complete NIH score done by neurology.  Skin: Skin is warm.    ED Course  Procedures (including critical care time) Labs Review Labs Reviewed  PROTIME-INR - Abnormal; Notable for the following:    Prothrombin Time 27.0 (*)    INR 2.61 (*)    All other components within normal limits  APTT - Abnormal; Notable for the following:    aPTT 48 (*)    All other components within normal limits  CBC - Abnormal; Notable for the following:    WBC 22.1 (*)    RBC 6.73 (*)    HCT 48.3 (*)    MCV 71.8  (*)    MCH 22.3 (*)    RDW 20.3 (*)    All other components within normal limits  DIFFERENTIAL - Abnormal; Notable for the following:    Neutrophils Relative % 90 (*)    Neutro Abs 19.9 (*)    Lymphocytes Relative 5 (*)    All other components within normal limits  COMPREHENSIVE METABOLIC PANEL - Abnormal; Notable for the following:    Sodium 133 (*)    Chloride 87 (*)    Glucose, Bld 207 (*)    Creatinine, Ser 0.49 (*)    Total Protein 8.9 (*)    Alkaline Phosphatase 156 (*)    All other components within normal limits  CBG MONITORING, ED - Abnormal; Notable for the following:    Glucose-Capillary 186 (*)    All other components within normal limits  I-STAT CHEM 8, ED - Abnormal; Notable for the following:    Sodium 133 (*)    Chloride 94 (*)    Glucose, Bld 200 (*)    Calcium, Ion 1.08 (*)    Hemoglobin 18.4 (*)    HCT 54.0 (*)    All other components within normal limits  DIGOXIN LEVEL  PROTIME-INR  URINALYSIS, ROUTINE W REFLEX MICROSCOPIC  PROTIME-INR  PROTIME-INR  I-STAT TROPOININ, ED   Imaging Review Ct Head (brain) Wo Contrast  03/18/2014   CLINICAL DATA:  63 year old female with left side deficit. Code stroke. Initial encounter.  EXAM: CT HEAD WITHOUT CONTRAST  TECHNIQUE: Contiguous axial images were obtained from the base of the skull through the vertex without intravenous contrast.  COMPARISON:  [facility] brain MRI 03/16/2014.  FINDINGS: At the site of the recently seen right occipital lobe white matter infarct there is now all large intra-axial hemorrhage with secondary extension into the right lateral ventricle. The parenchymal component of hemorrhage encompasses about 64 x 37 mm (estimated volume 53 mL).  Motion artifact. Leftward midline shift of approximately 5 mm. Mild enlargement of the temporal horns compared to the recent MRI.  Loss of sulci in the right hemisphere consistent with more generalized cerebral edema. Basilar cisterns remain patent. Stable  gray-white matter differentiation elsewhere.  No acute osseous abnormality identified. Visualized orbit soft tissues are within normal limits. Visualized scalp soft tissues are within normal limits. Continued in mildly progressed paranasal sinus inflammatory changes.  IMPRESSION: 1. 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 Intra-axial hematoma with estimated volume of 53 mm, plus secondary extension of hemorrhage into the right lateral ventricle.  2. Generalized right hemisphere edema. Leftward midline shift of 5 mm. 3. Mild enlargement of the temporal horns. Salient critical findings on this exam reviewed in person with Dr. Mike Craze on 03/18/2014 at 11:08AM .   Electronically Signed   By: Lars Pinks M.D.   On: 03/18/2014 11:15   Dg Chest Port 1 View  03/18/2014   CLINICAL DATA:  Altered mental status  EXAM: PORTABLE CHEST - 1 VIEW  COMPARISON:  February 14, 2014  FINDINGS: Lungs are clear. Heart is upper normal in size with normal pulmonary vascularity. No adenopathy. No pneumothorax. No bone lesions.  IMPRESSION: No edema or consolidation.   Electronically Signed   By: Lowella Grip M.D.   On: 03/18/2014 11:25     EKG Interpretation None      MDM   Final diagnoses:  Intracranial hemorrhage  Chronic anticoagulation- Coumadin    Patient with intracranial hemorrhage on Coumadin. INR is not normal. Seen emerged by neurology And myself. Labetalol was given for blood pressure control and feiba was given to reverse her coagulopathy.  CRITICAL CARE Performed by: Mackie Pai Total critical care time: 30 Critical care time was exclusive of separately billable procedures and treating other patients. Critical care was necessary to treat or prevent imminent or life-threatening deterioration. Critical care was time spent personally by me on the following activities: development of treatment plan with patient and/or  surrogate as well as nursing, discussions with consultants, evaluation of patient's response to treatment, examination of patient, obtaining history from patient or surrogate, ordering and performing treatments and interventions, ordering and review of laboratory studies, ordering and review of radiographic studies, pulse oximetry and re-evaluation of patient's condition.    Jasper Riling. Alvino Chapel, MD 03/18/14 [phone]

## 2014-03-18 NOTE — ED Notes (Signed)
MD Leonel Ramsay at bedside to discuss plan of care of pt with spouse and daughter at bedside

## 2014-03-18 NOTE — Progress Notes (Signed)
*  PRELIMINARY RESULTS* Vascular Ultrasound Carotid Duplex (Doppler) has been completed.   Study was technically limited due to poor patient cooperation. Findings suggest 1-39% internal carotid artery stenosis bilaterally. Vertebral arteries are patent with antegrade flow.  03/18/2014 6:14 PM Maudry Mayhew, RVT, RDCS, RDMS

## 2014-03-18 NOTE — ED Notes (Signed)
Per EMS: Pts family helped pt in shower, at 0900 they noticed left facial droop and left sided weakness. Per family, Pt had MRI Wed at [place]. Dx with subacute stroke, dx coumadin Wed. BP >200 en route, HR A fib. irr 100-120. Pt awake, responds to questions but needs continual stimulation.

## 2014-03-18 NOTE — Consult Note (Signed)
Reason for Consult: ICH / IVH Referring Physician: Neurologist  Colleen [name] is an 63 y.o. female.   HPI:  63 year old female with a history of paroxysmal after for relation was treated with Coumadin who had a recent MRI showing a right occipital infarct. She was a home last night where she had sudden onset of severe headache with nausea and vomiting. She has had chronic headaches over the last few weeks. She is brought to the emergency department and admitted to the neurology service. Her neuropathy was reversed. CT scan showed a right occipital intracerebral hemorrhage with interventricular extension without hydrocephalus a neurosurgical evaluation was requested.  Past Medical History  Diagnosis Date  . Hypertension   . Hiatal hernia   . Adenomatous polyp of colon   . Acute asthmatic bronchitis   . GERD (gastroesophageal reflux disease)     EGD neg 03/07/2008, but prev required dilation  . COPD (chronic obstructive pulmonary disease)   . IBS (irritable bowel syndrome)     chronic  . Urinary incontinence   . Esophagitis   . Chronic gastritis   . Duodenitis without hemorrhage 10/01/2001  . Diverticulosis   . Personal history of colonic adenomas 02/05/2008        . Anemia   . Hyperlipidemia   . Decreased peripheral vision of right eye   . Pneumonia 2011  . Hyperthyroidism   . Type II diabetes mellitus   . Stroke     a. Hx of stroke and ministrokes ~ 2009. b. TIA 11/2013, dx with AF at that time.  . Arthritis   . Depression   . On home oxygen therapy     "2L; suppose to use 24/7" (01/05/2014)  . PAF (paroxysmal atrial fibrillation)     a. Prior hx. b. Recurred 11/2013 in setting of possible TIA. Lifelong Coumadin planned. Spont converted to NSR. EF 60-65% by echo 11/2013, normal cors 2007, normal nuc 2011.   . Atrial flutter     a. Dx 12/2013. s/p DCCV. On anticoagulation.    Past Surgical History  Procedure Laterality Date  . Colonoscopy    . Upper gi endoscopy    .  Cholecystectomy  1980's  . Vaginal hysterectomy  1992  . Breast biopsy Left 1987    "milk gland"  . Cardioversion N/A 01/06/2014    Procedure: CARDIOVERSION;  Surgeon: Lelon Perla, MD;  Location: [facility] OR;  Service: Cardiovascular;  Laterality: N/A;    Allergies  Allergen Reactions  . Crestor [Rosuvastatin]   . Latex Hives  . Wellbutrin [Bupropion]     History  Substance Use Topics  . Smoking status: Current Every Day Smoker -- 1.00 packs/day for 51 years    Types: Cigarettes  . Smokeless tobacco: Never Used  . Alcohol Use: No    Family History  Problem Relation Age of Onset  . Heart disease Mother   . Asthma Father   . Lung cancer Father   . Asthma Sister   . Heart disease Sister   . Colon cancer Father      Review of Systems  Positive ROS: Unable to obtain  All other systems have been reviewed and were otherwise negative with the exception of those mentioned in the HPI and as above.  Objective: Vital signs in last 24 hours: Temp:  [97.6 F (36.4 C)-97.8 F (36.6 C)] 97.8 F (36.6 C) (03/20 1558) Pulse Rate:  [43-98] 62 (03/20 1800) Resp:  [12-21] 18 (03/20 1800) BP: (131-213)/(51-121) 131/73 mmHg (03/20 1800)  SpO2:  [95 %-100 %] 99 % (03/20 1800) Weight:  [65.772 kg (145 lb)-67.4 kg (148 lb 9.4 oz)] 67.4 kg (148 lb 9.4 oz) (03/20 1520)  General Appearance: Lethargic but arousable and opens eyes to voice, follows commands Head: Normocephalic, without obvious abnormality, atraumatic Eyes: PERRL, conjunctiva/corneas clear, EOM's intact    Lungs:  respirations unlabored Heart: irreg rhythm   NEUROLOGIC:   Mental status: Lethargic but arousable, unable to test for aphasia she does have comprehension and answer yes no questions, poor attention span, unable to test Memory and fund of knowledge Motor Exam - moves her right side well but seems to neglected her left side, she does localize on the left the pain Sensory Exam - unable to test Reflexes: symmetric, no  pathologic reflexes, No Hoffman's, No clonus Coordination - unable to test Gait - unable to test Balance - unable to test Cranial Nerves: I: smell Not tested  II: visual acuity  OS: na    OD: na  II: visual fields  unable to adequately test   II: pupils Equal, round, reactive to light  III,VII: ptosis None  III,IV,VI: extraocular muscles  Full ROM  V: mastication Normal  V: facial light touch sensation  Normal  V,VII: corneal reflex  Present  VII: facial muscle function - upper  Normal  VII: facial muscle function - lower Normal  VIII: hearing Not tested  IX: soft palate elevation   not tested   IX,X: gag reflex  not tested   XI: trapezius strength   not tested   XI: sternocleidomastoid strength 5/5  XI: neck flexion strength   not tested   XII: tongue strength  Normal    Data Review Lab Results  Component Value Date   WBC 22.1* 03/18/2014   HGB 18.4* 03/18/2014   HCT 54.0* 03/18/2014   MCV 71.8* 03/18/2014   PLT 381 03/18/2014   Lab Results  Component Value Date   NA 133* 03/18/2014   K 4.1 03/18/2014   CL 94* 03/18/2014   CO2 24 03/18/2014   BUN 15 03/18/2014   CREATININE 0.60 03/18/2014   GLUCOSE 200* 03/18/2014   Lab Results  Component Value Date   INR 1.14 03/18/2014    Radiology: Ct Head (brain) Wo Contrast  03/18/2014   CLINICAL DATA:  63 year old female with left side deficit. Code stroke. Initial encounter.  EXAM: CT HEAD WITHOUT CONTRAST  TECHNIQUE: Contiguous axial images were obtained from the base of the skull through the vertex without intravenous contrast.  COMPARISON:  [facility] brain MRI 03/16/2014.  FINDINGS: At the site of the recently seen right occipital lobe white matter infarct there is now all large intra-axial hemorrhage with secondary extension into the right lateral ventricle. The parenchymal component of hemorrhage encompasses about 64 x 37 mm (estimated volume 53 mL).  Motion artifact. Leftward midline shift of approximately 5 mm. Mild  enlargement of the temporal horns compared to the recent MRI.  Loss of sulci in the right hemisphere consistent with more generalized cerebral edema. Basilar cisterns remain patent. Stable gray-white matter differentiation elsewhere.  No acute osseous abnormality identified. Visualized orbit soft tissues are within normal limits. Visualized scalp soft tissues are within normal limits. Continued in mildly progressed paranasal sinus inflammatory changes.  IMPRESSION: 1. 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 Intra-axial hematoma with estimated volume of 53 mm, plus secondary extension of hemorrhage into the right lateral ventricle. 2. Generalized right hemisphere edema. Leftward  midline shift of 5 mm. 3. Mild enlargement of the temporal horns. Salient critical findings on this exam reviewed in person with Dr. Mike Craze on 03/18/2014 at 11:08AM .   Electronically Signed   By: Lars Pinks M.D.   On: 03/18/2014 11:15   Dg Chest Port 1 View  03/18/2014   CLINICAL DATA:  Altered mental status  EXAM: PORTABLE CHEST - 1 VIEW  COMPARISON:  February 14, 2014  FINDINGS: Lungs are clear. Heart is upper normal in size with normal pulmonary vascularity. No adenopathy. No pneumothorax. No bone lesions.  IMPRESSION: No edema or consolidation.   Electronically Signed   By: Lowella Grip M.D.   On: 03/18/2014 11:25     Assessment/Plan: 63 year old female with a right occipital intracerebral hemorrhage with interventricular extension without hydrocephalus after everting a bland infarct or hemorrhagic infarct. Likely related to her coagulopathy. She has been reversed. At this point she is no hydrocephalus and there is no occasion for acute intervention. I do not believe that interventricular tPA would be useful at this point especially given the fact that this is likely an intracerebral hemorrhage that extended into the ventricle. No need for her  ventriculostomy and less she developed hydrocephalus. No need for surgical evaluation. I will follow while she is in the hospital. CT scan for tomorrow morning has been ordered. We'll obviously repeated her scan if she has a change in mental status.   Neeraj Housand S 03/18/2014 6:36 PM

## 2014-03-18 NOTE — Code Documentation (Signed)
(  Late entry for this pt d/t three sequential code strokes). 63 yo female who went to [facility] ED on Wed, 03/16/14 for an MRI. She is in chronic Afib and on coumadin, but she is unable to tell us why the MRI was ordered. She was told she had a hemorrhage and to stop taking her coumadin.  This morning she was able to walk into the shower with a family member around 0900, but when they were ready to take her out, she was unable to walk, and they noticed L facial droop. EMS was called and they activated code stroke at 1051. (see log for other times) On arrival, she was able to give the above history, c/o headache and BP was 199/120. She was taken to CT which showed "malignant hemorrhagic transformation of the right occipital lobe  infarct first identified by MRI on 03/16/2014." with  59mm shift. Pt returned to Trauma B. BP treated with labetalol (see ED note). Neglecting L side, but remains awake and answering questions appropriately. INR=2.6 on admission labs. Treated with vit K and Kcentra. Family in and updated.  Handoff done with ED RN, Magda Paganini.

## 2014-03-18 NOTE — ED Notes (Signed)
Pt is alert and responds to verbal stimuli, pt is diaphoretic, pt has no awareness to Left side of her body, Godfrey Pick RR RN called for further assessment on pt. This RN took report from [employer]

## 2014-03-18 NOTE — Progress Notes (Signed)
UR completed.  Shakeitha Umbaugh, RN BSN M[facility] CCM Trauma/Neuro ICU Case Manager [phone]  

## 2014-03-18 NOTE — Consult Note (Deleted)
Neurology H&P  CC: worsening left sided weakness  History is obtained from:Patient  HPI: Colleen [name] is a 63 y.o. female with a history of recent stroke on Wednesday who was discharged from [facility] after having coumadin stopped for some pethechial hemorrhage associated with the hemorrhage.    LKW: 03/18  tpa given?: no, hemorrhage    ROS: A 14 point ROS was performed and is negative except as noted in the HPI.  Past Medical History  Diagnosis Date  . Hypertension   . Hiatal hernia   . Adenomatous polyp of colon   . Acute asthmatic bronchitis   . GERD (gastroesophageal reflux disease)     EGD neg 03/07/2008, but prev required dilation  . COPD (chronic obstructive pulmonary disease)   . IBS (irritable bowel syndrome)     chronic  . Urinary incontinence   . Esophagitis   . Chronic gastritis   . Duodenitis without hemorrhage 10/01/2001  . Diverticulosis   . Personal history of colonic adenomas 02/05/2008        . Anemia   . Hyperlipidemia   . Decreased peripheral vision of right eye   . Pneumonia 2011  . Hyperthyroidism   . Type II diabetes mellitus   . Stroke     a. Hx of stroke and ministrokes ~ 2009. b. TIA 11/2013, dx with AF at that time.  . Arthritis   . Depression   . On home oxygen therapy     "2L; suppose to use 24/7" (01/05/2014)  . PAF (paroxysmal atrial fibrillation)     a. Prior hx. b. Recurred 11/2013 in setting of possible TIA. Lifelong Coumadin planned. Spont converted to NSR. EF 60-65% by echo 11/2013, normal cors 2007, normal nuc 2011.   . Atrial flutter     a. Dx 12/2013. s/p DCCV. On anticoagulation.    Exam: Current vital signs: BP 199/120  Pulse 96  Resp 20  SpO2 100% Vital signs in last 24 hours: Pulse Rate:  [96] 96 (03/20 1116) Resp:  [18-20] 20 (03/20 1116) BP: (199)/(120) 199/120 mmHg (03/20 1116) SpO2:  [99 %-100 %] 100 % (03/20 1116)  General: in bed Resp: non-laboured breathing CV: Irregular Abd: NT, ND Ext: no  edema Mental Status: Patient is awake, alert, oriented to person,  month, year,  Patient is able to give a clear and coherent history. No signs of aphasia, but does have left neglect Cranial Nerves: II: L hemianopia. Pupils are equal, round, and reactive to light.  Discs are difficult to visualize. III,IV, VI: EOMI without ptosis or diploplia.  V: Facial sensation is symmetric to temperature VII: Facial movement is decreased on left VIII: hearing is intact to voice X: Uvula elevates symmetrically XI: Shoulder shrug is symmetric. XII: tongue is midline without atrophy or fasciculations.  Motor: Tone is normal. Bulk is normal. 5/5 strength was present on the right, mild 4/5 weakness on the left.  Sensory: Sensation is decreased on the left, does extinguish to DSS Deep Tendon Reflexes: 2+ and symmetric in the biceps and patellae.  Cerebellar: FNF and HKS are intact on right Gait: Not assessed due to acute nature of evaluation and multiple medical monitors in ED setting.   I have reviewed labs in epic and the results pertinent to this consultation are: INR 2.6  I have reviewed the images obtained:CT head - left posterior hemorrhage.  MRI brain 3/18 - hermorrhagic infarct in the same area.   Impression: 63 yo F with hemorrhagic conversion of  an ischemic infarct. It is unclear to me how much workup for her infarct was done at [facility]. She will need strict BP control and reversal of her anticoagulation.   Recommendations: 1. HgbA1c, fasting lipid panel 3. Frequent neuro checks 4. Echocardiogram 5. Carotid dopplers 6. Prophylactic therapy-None 7. Risk factor modification 8. Telemetry monitoring 9. PT consult, OT consult, Speech consult 10. kcentra for INR 2.6  11. Labetalol drip for elevated BP.  12. SSI for DM  This patient is critically ill and at significant risk of neurological worsening, death and care requires constant monitoring of vital signs, hemodynamics,respiratory  and cardiac monitoring, neurological assessment, discussion with family, other specialists and medical decision making of high complexity. I spent 60 minutes of neurocritical care time  in the care of  this patient.  Roland Rack, MD [facility] [phone]  If 7pm- 7am, please page neurology on call at [phone].  03/18/2014  11:59 AM

## 2014-03-19 ENCOUNTER — Inpatient Hospital Stay (HOSPITAL_COMMUNITY): Payer: Medicaid Other

## 2014-03-19 DIAGNOSIS — I6789 Other cerebrovascular disease: Secondary | ICD-10-CM

## 2014-03-19 LAB — HEMOGLOBIN A1C
Hgb A1c MFr Bld: 6.1 % — ABNORMAL HIGH (ref <5.7)
Mean Plasma Glucose: 128 mg/dL — ABNORMAL HIGH (ref <117)

## 2014-03-19 LAB — URINE MICROSCOPIC-ADD ON

## 2014-03-19 LAB — GLUCOSE, CAPILLARY
GLUCOSE-CAPILLARY: 119 mg/dL — AB (ref 70–99)
GLUCOSE-CAPILLARY: 134 mg/dL — AB (ref 70–99)
GLUCOSE-CAPILLARY: 144 mg/dL — AB (ref 70–99)
Glucose-Capillary: 118 mg/dL — ABNORMAL HIGH (ref 70–99)
Glucose-Capillary: 141 mg/dL — ABNORMAL HIGH (ref 70–99)
Glucose-Capillary: 135 mg/dL — ABNORMAL HIGH (ref 70–99)

## 2014-03-19 LAB — LIPID PANEL
Cholesterol: 159 mg/dL (ref 0–200)
HDL: 58 mg/dL (ref >39)
LDL CALC: 74 mg/dL (ref 0–99)
TRIGLYCERIDES: 134 mg/dL (ref <150)
Total CHOL/HDL Ratio: 2.7 RATIO
VLDL: 27 mg/dL (ref 0–40)

## 2014-03-19 LAB — URINALYSIS, ROUTINE W REFLEX MICROSCOPIC
Glucose, UA: 100 mg/dL — AB
HGB URINE DIPSTICK: NEGATIVE
Ketones, ur: 15 mg/dL — AB
Leukocytes, UA: NEGATIVE
NITRITE: NEGATIVE
PH: 6.5 (ref 5.0–8.0)
Protein, ur: >300 mg/dL — AB
Specific Gravity, Urine: 1.018 (ref 1.005–1.030)
Urobilinogen, UA: 1 mg/dL (ref 0.0–1.0)

## 2014-03-19 LAB — PROTIME-INR
INR: 1.02 (ref 0.00–1.49)
INR: 1.03 (ref 0.00–1.49)
INR: 1.03 (ref 0.00–1.49)
PROTHROMBIN TIME: 13.2 s (ref 11.6–15.2)
PROTHROMBIN TIME: 13.3 s (ref 11.6–15.2)
Prothrombin Time: 13.3 seconds (ref 11.6–15.2)

## 2014-03-19 MED ORDER — POTASSIUM CHLORIDE IN NACL 20-0.9 MEQ/L-% IV SOLN
INTRAVENOUS | Status: DC
  Administered 2014-03-19: 15:00:00 via INTRAVENOUS
  Administered 2014-03-20: 100 mL/h via INTRAVENOUS
  Administered 2014-03-20 – 2014-03-22 (×5): via INTRAVENOUS
  Filled 2014-03-19 (×10): qty 1000

## 2014-03-19 NOTE — Progress Notes (Signed)
Patient ID: Colleen [name], female   DOB: 1951/01/25, 63 y.o.   MRN: [id number] CT scan was reviewed. There is no change in the right except for all infection or hemorrhage with interventricular extension. There is no new hydrocephalus. No new shift. Her neurologic exam is unchanged. She remains in what lethargic but arousable and follows commands. Likely neglects her left side.

## 2014-03-19 NOTE — Progress Notes (Signed)
Stroke Team Progress Note  HISTORY 63 y.o. female with a history of recent stroke on Wednesday who was discharged from [facility] after having coumadin stopped for some pethechial hemorrhage associated with the hemorrhage. Pt was found to have right temporal-occipital region with intraventricular extension  into the right lateral ventricle    SUBJECTIVE Mental status improved follows commands.    OBJECTIVE Most recent Vital Signs: Filed Vitals:   03/19/14 0800 03/19/14 0900 03/19/14 0915 03/19/14 0934  BP: 166/85 172/92 157/87   Pulse:  72 107   Temp:      TempSrc:      Resp: 19 18 16    Height:      Weight:      SpO2:  90% 97% 94%   CBG (last 3)   Recent Labs  03/19/14 0002 03/19/14 0311 03/19/14 0743  GLUCAP 134* 118* 144*    IV Fluid Intake:   . labetalol (NORMODYNE) infusion 1 mg/min (03/19/14 0817)    MEDICATIONS  . digoxin  0.125 mg Oral Daily  . DULoxetine  120 mg Oral Daily  . insulin aspart  2-6 Units Subcutaneous 6 times per day  . methimazole  5 mg Oral Daily  . mometasone-formoterol  2 puff Inhalation BID  . pantoprazole (PROTONIX) IV  40 mg Intravenous QHS  . tiotropium  18 mcg Inhalation Daily   PRN:  acetaminophen, acetaminophen, labetalol  Diet:  NPO  Activity:  Bedrest DVT Prophylaxis:  SCDs  CLINICALLY SIGNIFICANT STUDIES Basic Metabolic Panel:  Recent Labs Lab 03/18/14 1057 03/18/14 1109  NA 133* 133*  K 4.3 4.1  CL 87* 94*  CO2 24  --   GLUCOSE 207* 200*  BUN 13 15  CREATININE 0.49* 0.60  CALCIUM 10.0  --    Liver Function Tests:  Recent Labs Lab 03/18/14 1057  AST 36  ALT 13  ALKPHOS 156*  BILITOT 0.8  PROT 8.9*  ALBUMIN 4.0   CBC:  Recent Labs Lab 03/18/14 1057 03/18/14 1109  WBC 22.1*  --   NEUTROABS 19.9*  --   HGB 15.0 18.4*  HCT 48.3* 54.0*  MCV 71.8*  --   PLT 381  --    Coagulation:  Recent Labs Lab 03/18/14 1311 03/18/14 1820 03/19/14 0045 03/19/14 0550  LABPROT 14.4 13.2 13.3 13.3  INR  1.14 1.02 1.03 1.03   Cardiac Enzymes: No results found for this basename: CKTOTAL, CKMB, CKMBINDEX, TROPONINI,  in the last 168 hours Urinalysis:  Recent Labs Lab 03/19/14 0620  COLORURINE AMBER*  LABSPEC 1.018  PHURINE 6.5  GLUCOSEU 100*  HGBUR NEGATIVE  BILIRUBINUR SMALL*  KETONESUR 15*  PROTEINUR >300*  UROBILINOGEN 1.0  NITRITE NEGATIVE  LEUKOCYTESUR NEGATIVE   Lipid Panel    Component Value Date/Time   CHOL 159 03/19/2014 0550   TRIG 134 03/19/2014 0550   HDL 58 03/19/2014 0550   CHOLHDL 2.7 03/19/2014 0550   VLDL 27 03/19/2014 0550   LDLCALC 74 03/19/2014 0550   HgbA1C  Lab Results  Component Value Date   HGBA1C 7.0* 01/05/2014    Urine Drug Screen:     Component Value Date/Time   LABOPIA POSITIVE* 12/07/2013 2131   COCAINSCRNUR NONE DETECTED 12/07/2013 2131   LABBENZ NONE DETECTED 12/07/2013 2131   AMPHETMU NONE DETECTED 12/07/2013 2131   THCU NONE DETECTED 12/07/2013 2131   LABBARB NONE DETECTED 12/07/2013 2131    Alcohol Level: No results found for this basename: ETH,  in the last 168 hours  Ct Head Wo Contrast  03/19/2014   CLINICAL DATA:  Intra cerebral hemorrhage  EXAM: CT HEAD WITHOUT CONTRAST  TECHNIQUE: Contiguous axial images were obtained from the base of the skull through the vertex without intravenous contrast.  COMPARISON:  Prior CT from 03/18/2014.  FINDINGS: Intraparenchymal hematoma involving the right temporal occipital region is overall not significantly changed measuring 3.7 x 6.4 cm, previously 3.7 x 6.4 cm on 03/18/2014. Intraventricular extension into the right lateral ventricle is similar as well. There is partial effacement of the right lateral ventricle with persistent 5 mm right-to-left midline shift. Asymmetric enlargement of the temporal horns of the lateral ventricles is similar to prior, right greater than left. No hydrocephalus. Crowding of the right perimesencephalic cistern is stable. Basilar cisterns overall remain patent. Diffuse cortical  swelling with loss of sulcation again seen within the right cerebral hemisphere, compatible with generalized edema, not significantly changed.  No new intracranial hemorrhage. No new large vessel territory infarct. No extra-axial fluid collection. No mass lesion.  Calvarium remains intact. Orbits are within normal limits. Visualized scalp soft tissues unremarkable.  Paranasal sinus disease involving the maxillary sinuses is similar to prior. Small right mastoid effusion noted.  IMPRESSION: 1. No significant interval change in intra-axial hematoma involving the right temporal-occipital region with intraventricular extension into the right lateral ventricle. The intraparenchymal hematoma measures 6.4 x 3.7 cm on today's exam, previously 6.4 x 3.7 cm on 03/18/2014. 2. Similar appearance of diffuse edema involving the right cerebral hemisphere. Similar leftward midline shift of 5 mm. 3. Similar enlargement of the temporal horns of the lateral ventricles bilaterally. No hydrocephalus. Basilar cisterns remain patent. 4. No new intracranial hemorrhage or infarct identified.   Electronically Signed   By: Jeannine Boga M.D.   On: 03/19/2014 06:40   Ct Head (brain) Wo Contrast  03/18/2014   CLINICAL DATA:  63 year old female with left side deficit. Code stroke. Initial encounter.  EXAM: CT HEAD WITHOUT CONTRAST  TECHNIQUE: Contiguous axial images were obtained from the base of the skull through the vertex without intravenous contrast.  COMPARISON:  [facility] brain MRI 03/16/2014.  FINDINGS: At the site of the recently seen right occipital lobe white matter infarct there is now all large intra-axial hemorrhage with secondary extension into the right lateral ventricle. The parenchymal component of hemorrhage encompasses about 64 x 37 mm (estimated volume 53 mL).  Motion artifact. Leftward midline shift of approximately 5 mm. Mild enlargement of the temporal horns compared to the recent MRI.  Loss of sulci in  the right hemisphere consistent with more generalized cerebral edema. Basilar cisterns remain patent. Stable gray-white matter differentiation elsewhere.  No acute osseous abnormality identified. Visualized orbit soft tissues are within normal limits. Visualized scalp soft tissues are within normal limits. Continued in mildly progressed paranasal sinus inflammatory changes.  IMPRESSION: 1. 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 Intra-axial hematoma with estimated volume of 53 mm, plus secondary extension of hemorrhage into the right lateral ventricle. 2. Generalized right hemisphere edema. Leftward midline shift of 5 mm. 3. Mild enlargement of the temporal horns. Salient critical findings on this exam reviewed in person with Dr. Mike Craze on 03/18/2014 at 11:08AM .   Electronically Signed   By: Lars Pinks M.D.   On: 03/18/2014 11:15   Dg Chest Port 1 View  03/18/2014   CLINICAL DATA:  Altered mental status  EXAM: PORTABLE CHEST - 1 VIEW  COMPARISON:  February 14, 2014  FINDINGS: Lungs are clear.  Heart is upper normal in size with normal pulmonary vascularity. No adenopathy. No pneumothorax. No bone lesions.  IMPRESSION: No edema or consolidation.   Electronically Signed   By: Lowella Grip M.D.   On: 03/18/2014 11:25    CT of the brain  Malignant hemorrhagic transformation of the right occipital lobe  infarct first identified by MRI on 03/16/2014. Intra-axial hematoma  with estimated volume of 53 mm, plus secondary extension of  hemorrhage into the right lateral ventricle.   MRI of the brain    MRA of the brain    2D Echocardiogram    Carotid Doppler    CXR    EKG  A-fib.   Therapy Recommendations pendnig  Physical Exam  General: in bed  Resp: non-laboured breathing  CV: Irregular  Abd: NT, ND  Ext: no edema  Mental Status:  Patient is awake, alert, oriented to person Patient is able to give a clear and  coherent history.  No signs of aphasia, but does have left neglect  Cranial Nerves:  II: L hemianopia. Pupils are equal, round, and reactive to light. Discs are difficult to visualize.  III,IV, VI: EOMI without ptosis or diploplia.  V: Facial sensation is symmetric to temperature  VII: Facial movement is decreased on left  VIII: hearing is intact to voice  X: Uvula elevates symmetrically  XI: Shoulder shrug is symmetric.  XII: tongue is midline without atrophy or fasciculations.  Motor:  Tone is normal. Bulk is normal. 5/5 strength was present on the right, mild 4/5 weakness on the left.  Sensory:  Sensation is decreased on the left, does extinguish to DSS  Deep Tendon Reflexes:  2+ and symmetric in the biceps and patellae.  Cerebellar:  FNF and HKS are intact on right  Gait:  Not assessed g.   ASSESSMENT Colleen [name] is a 63 y.o. female with hemorrhagic conversion of an ischemic infarct. It is unclear to me how much workup for her infarct was done at [facility]. She will need strict BP control and reversal of her anticoagulation   Hemorrhagic stroke conversion s/p PPC  LDL 74  HbA1c pending  DM   Hospital day # 1  TREATMENT/PLAN  No antiplatelet due to hemorrahge  SCDs DVT prophylaxis.   S/p [facility] with INR 1.03  hbA1c  Echo  Carotid dopplers  Appreciate Neurosurgery eval. Midline shift but due to good mentation does not need eVD  Tele  SSI for DM  Pt/ot/speech.    03/19/2014 10:03 AM  I have personally obtained a history, examined the patient, evaluated imaging results, and formulated the assessment and plan of care. I agree with the above.    To contact Stroke Continuity provider, please refer to [email or web address] After hours, contact General Neurology

## 2014-03-19 NOTE — Evaluation (Signed)
Speech Language Pathology Evaluation Patient Details Name: Colleen [name] MRN: [id number] DOB: 12-31-1950 Today's Date: 03/19/2014 Time: 8315-1761 SLP Time Calculation (min): 24 min  Problem List:  Patient Active Problem List   Diagnosis Date Noted  . Intracranial hematoma 03/18/2014  . Stroke 03/18/2014  . Chronic respiratory failure with hypoxia 02/14/2014  . Chronic anticoagulation- Coumadin 01/06/2014  . H/O: CVA (cerebrovascular accident) 01/06/2014  . Normal coronary arteries- 2007 01/06/2014  . Diastolic dysfunction grade 2 by Echo 12/14 01/06/2014  . Atrial flutter 01/04/2014  . TIA (transient ischemic attack) 12/14 12/08/2013  . Diabetes mellitus 12/08/2013  . Atrial fibrillation with RVR 12/07/2013  . UNSPECIFIED DISORDER OF THYROID 10/09/2010  . HYPERLIPIDEMIA 08/08/2010  . TOBACCO ABUSE 08/08/2010  . AORTIC VALVE DISORDERS 08/08/2010  . CAROTID BRUIT 08/08/2010  . ATRIAL FIBRILLATION 06/20/2010  . PALPITATIONS 06/20/2010  . DYSPNEA 06/20/2010  . CHEST PAIN 06/20/2010  . DYSPHAGIA UNSPECIFIED 06/20/2010  . HYPERTENSION, BENIGN 06/21/2008  . OBESITY 02/24/2008  . Personal history of colonic adenomas 02/05/2008  . ASTHMATIC BRONCHITIS, ACUTE 02/05/2008  . COPD 02/05/2008  . GASTROESOPHAGEAL REFLUX DISEASE 02/05/2008  . HIATAL HERNIA 02/05/2008   Past Medical History:  Past Medical History  Diagnosis Date  . Hypertension   . Hiatal hernia   . Adenomatous polyp of colon   . Acute asthmatic bronchitis   . GERD (gastroesophageal reflux disease)     EGD neg 03/07/2008, but prev required dilation  . COPD (chronic obstructive pulmonary disease)   . IBS (irritable bowel syndrome)     chronic  . Urinary incontinence   . Esophagitis   . Chronic gastritis   . Duodenitis without hemorrhage 10/01/2001  . Diverticulosis   . Personal history of colonic adenomas 02/05/2008        . Anemia   . Hyperlipidemia   . Decreased peripheral vision of right eye   . Pneumonia 2011   . Hyperthyroidism   . Type II diabetes mellitus   . Stroke     a. Hx of stroke and ministrokes ~ 2009. b. TIA 11/2013, dx with AF at that time.  . Arthritis   . Depression   . On home oxygen therapy     "2L; suppose to use 24/7" (01/05/2014)  . PAF (paroxysmal atrial fibrillation)     a. Prior hx. b. Recurred 11/2013 in setting of possible TIA. Lifelong Coumadin planned. Spont converted to NSR. EF 60-65% by echo 11/2013, normal cors 2007, normal nuc 2011.   . Atrial flutter     a. Dx 12/2013. s/p DCCV. On anticoagulation.   Past Surgical History:  Past Surgical History  Procedure Laterality Date  . Colonoscopy    . Upper gi endoscopy    . Cholecystectomy  1980's  . Vaginal hysterectomy  1992  . Breast biopsy Left 1987    "milk gland"  . Cardioversion N/A 01/06/2014    Procedure: CARDIOVERSION;  Surgeon: Lelon Perla, MD;  Location: [place];  Service: Cardiovascular;  Laterality: N/A;   HPI:  63 y.o. female with a history of recent stroke on Wednesday who was discharged from [facility] after having coumadin stopped for some pethechial hemorrhage associated with the hemorrhage.  PMH:  HTN, hiatal hernia, GERD, COPD, esophagitis, chronic gastritis, pna, CVA 2009.  CXR 3/20 no edema or consolidation.   02/14/14 right lower lobe infiltrate and effusion.  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 Intra-axial hematoma with estimated  volume of 53 mm, plus secondary extension of hemorrhage into the right lateral ventricle.  Repeat CT 3/21 No new intracranial hemorrhage or infarct identified.   No edema or consolidation.    Assessment / Plan / Recommendation Clinical Impression  Pt. demonstrating severe left neglect and significant communicative-cognitvie deficits marked by poor intelligibility, decreased focused attention, and awareness to self and environment.  SLP will continue to treat for improvements in  communication and cognition.    SLP Assessment  Patient needs continued Speech Lanaguage Pathology Services    Follow Up Recommendations  Inpatient Rehab    Frequency and Duration min 2x/week      Pertinent Vitals/Pain WDL       SLP Evaluation Prior Functioning  Cognitive/Linguistic Baseline: Information not available   Cognition  Overall Cognitive Status: Impaired/Different from baseline Arousal/Alertness: Lethargic Orientation Level: Disoriented to situation;Disoriented to time;Disoriented to place;Disoriented to person Attention: Focused Focused Attention: Impaired Focused Attention Impairment: Verbal basic Memory: Impaired Awareness: Impaired Awareness Impairment: Intellectual impairment;Emergent impairment;Anticipatory impairment Problem Solving: Impaired Problem Solving Impairment: Verbal basic Executive Function: Self Correcting;Self Monitoring;Decision Making;Organizing Behaviors: Restless Safety/Judgment: Impaired    Comprehension  Auditory Comprehension Overall Auditory Comprehension: Impaired Yes/No Questions: Impaired Basic Biographical Questions: 0-25% accurate Commands: Impaired One Step Basic Commands: 0-24% accurate Interfering Components: Attention;Processing speed Visual Recognition/Discrimination Discrimination: Not tested Reading Comprehension Reading Status:  (TBA)    Expression Expression Primary Mode of Expression: Verbal Verbal Expression Overall Verbal Expression: Impaired Initiation: Impaired Level of Generative/Spontaneous Verbalization: Phrase Repetition:  (TBA) Naming:  (TBA) Pragmatics: Impairment Impairments: Eye contact Interfering Components: Attention Written Expression Dominant Hand: Right Written Expression:  (TBA)   Oral / Motor Oral Motor/Sensory Function Overall Oral Motor/Sensory Function:  (poor ability to cooperate, left weakness & decr ROM) Motor Speech Overall Motor Speech: Impaired Respiration:  Impaired Phonation: Low vocal intensity Resonance:  (TBA) Intelligibility: Intelligibility reduced Word: 0-24% accurate Phrase: 0-24% accurate Motor Planning:  (TBA)        Orbie Pyo Rutilio Yellowhair M.Ed [employer] [phone]  03/19/2014

## 2014-03-19 NOTE — Progress Notes (Signed)
Notified Dr Irish Elders of pt decreased UOP. Orders received. Susie Cassette RN

## 2014-03-19 NOTE — Progress Notes (Signed)
  Echocardiogram 2D Echocardiogram has been performed.  Terresa Marlett FRANCES 03/19/2014, 11:33 AM

## 2014-03-19 NOTE — Evaluation (Signed)
Clinical/Bedside Swallow Evaluation Patient Details  Name: Colleen [name] MRN: [id number] Date of Birth: [birth year]  Today's Date: 03/19/2014 Time: 4403-4742 SLP Time Calculation (min): 24 min  Past Medical History:  Past Medical History  Diagnosis Date  . Hypertension   . Hiatal hernia   . Adenomatous polyp of colon   . Acute asthmatic bronchitis   . GERD (gastroesophageal reflux disease)     EGD neg 03/07/2008, but prev required dilation  . COPD (chronic obstructive pulmonary disease)   . IBS (irritable bowel syndrome)     chronic  . Urinary incontinence   . Esophagitis   . Chronic gastritis   . Duodenitis without hemorrhage 10/01/2001  . Diverticulosis   . Personal history of colonic adenomas 02/05/2008        . Anemia   . Hyperlipidemia   . Decreased peripheral vision of right eye   . Pneumonia 2011  . Hyperthyroidism   . Type II diabetes mellitus   . Stroke     a. Hx of stroke and ministrokes ~ 2009. b. TIA 11/2013, dx with AF at that time.  . Arthritis   . Depression   . On home oxygen therapy     "2L; suppose to use 24/7" (01/05/2014)  . PAF (paroxysmal atrial fibrillation)     a. Prior hx. b. Recurred 11/2013 in setting of possible TIA. Lifelong Coumadin planned. Spont converted to NSR. EF 60-65% by echo 11/2013, normal cors 2007, normal nuc 2011.   . Atrial flutter     a. Dx 12/2013. s/p DCCV. On anticoagulation.   Past Surgical History:  Past Surgical History  Procedure Laterality Date  . Colonoscopy    . Upper gi endoscopy    . Cholecystectomy  1980's  . Vaginal hysterectomy  1992  . Breast biopsy Left 1987    "milk gland"  . Cardioversion N/A 01/06/2014    Procedure: CARDIOVERSION;  Surgeon: Lelon Perla, MD;  Location: [place];  Service: Cardiovascular;  Laterality: N/A;   HPI:  63 y.o. female with a history of recent stroke on Wednesday who was discharged from [facility] after having coumadin stopped for some pethechial hemorrhage associated with the  hemorrhage.  PMH:  HTN, hiatal hernia, GERD, COPD, esophagitis, chronic gastritis, pna, CVA 2009.  CXR 3/20 no edema or consolidation.   02/14/14 right lower lobe infiltrate and effusion.  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 Intra-axial hematoma with estimated volume of 53 mm, plus secondary extension of hemorrhage into the right lateral ventricle.  Repeat CT 3/21 No new intracranial hemorrhage or infarct identified.   No edema or consolidation.    Assessment / Plan / Recommendation Clinical Impression  Swallow assessment brief given pt.'s decreased sustained attention, lethargy, decreased awareness to environment/situation, and severe left neglect.  Oral care removed dried bloood tinged mucous from hard palate/tongue/lips.  Reduced oral manipulation, delayed transit, suspected swallow intiation delay and decreased laryngeal elevation with ice chip.  Recommend continue NPO with aggressive oral care and ST will follow up next date.    Aspiration Risk  Severe    Diet Recommendation NPO        Other  Recommendations Oral Care Recommendations: Oral care Q4 per protocol   Follow Up Recommendations  Inpatient Rehab    Frequency and Duration min 2x/week  2 weeks   Pertinent Vitals/Pain WDL         Swallow Study  Oral/Motor/Sensory Function Overall Oral Motor/Sensory Function:  (poor ability to cooperate, left weakness & decr ROM)   Ice Chips Ice chips: Impaired Presentation: Spoon Oral Phase Impairments: Reduced labial seal;Reduced lingual movement/coordination;Impaired anterior to posterior transit;Poor awareness of bolus Oral Phase Functional Implications: Prolonged oral transit Pharyngeal Phase Impairments: Suspected delayed Swallow;Decreased hyoid-laryngeal movement   Thin Liquid Thin Liquid: Not tested    Nectar Thick Nectar Thick Liquid: Not tested   Honey Thick Honey Thick Liquid: Not  tested   Puree Puree: Not tested   Solid   GO    Solid: Not tested       Houston Siren M.Ed [employer] [phone]  03/19/2014

## 2014-03-20 ENCOUNTER — Inpatient Hospital Stay (HOSPITAL_COMMUNITY): Payer: Medicaid Other

## 2014-03-20 LAB — CBC
HCT: 39.1 % (ref 36.0–46.0)
Hemoglobin: 11.6 g/dL — ABNORMAL LOW (ref 12.0–15.0)
MCH: 22.3 pg — AB (ref 26.0–34.0)
MCHC: 29.7 g/dL — ABNORMAL LOW (ref 30.0–36.0)
MCV: 75.2 fL — ABNORMAL LOW (ref 78.0–100.0)
PLATELETS: 228 10*3/uL (ref 150–400)
RBC: 5.2 MIL/uL — ABNORMAL HIGH (ref 3.87–5.11)
RDW: 20.9 % — ABNORMAL HIGH (ref 11.5–15.5)
WBC: 11.3 10*3/uL — ABNORMAL HIGH (ref 4.0–10.5)

## 2014-03-20 LAB — BASIC METABOLIC PANEL
BUN: 18 mg/dL (ref 6–23)
CO2: 21 meq/L (ref 19–32)
CREATININE: 0.55 mg/dL (ref 0.50–1.10)
Calcium: 9 mg/dL (ref 8.4–10.5)
Chloride: 99 mEq/L (ref 96–112)
GFR calc Af Amer: >90 mL/min (ref >90)
Glucose, Bld: 111 mg/dL — ABNORMAL HIGH (ref 70–99)
Potassium: 4.2 mEq/L (ref 3.7–5.3)
Sodium: 135 mEq/L — ABNORMAL LOW (ref 137–147)

## 2014-03-20 LAB — GLUCOSE, CAPILLARY
GLUCOSE-CAPILLARY: 126 mg/dL — AB (ref 70–99)
GLUCOSE-CAPILLARY: 120 mg/dL — AB (ref 70–99)
GLUCOSE-CAPILLARY: 95 mg/dL (ref 70–99)
Glucose-Capillary: 115 mg/dL — ABNORMAL HIGH (ref 70–99)
Glucose-Capillary: 114 mg/dL — ABNORMAL HIGH (ref 70–99)
Glucose-Capillary: 103 mg/dL — ABNORMAL HIGH (ref 70–99)

## 2014-03-20 LAB — PROTIME-INR
INR: 1.08 (ref 0.00–1.49)
Prothrombin Time: 13.8 seconds (ref 11.6–15.2)

## 2014-03-20 MED ORDER — VITAL AF 1.2 CAL PO LIQD
1000.0000 mL | ORAL | Status: DC
  Administered 2014-03-20: 1000 mL
  Filled 2014-03-20 (×2): qty 1000

## 2014-03-20 MED ORDER — CHLORHEXIDINE GLUCONATE 0.12 % MT SOLN
15.0000 mL | Freq: Two times a day (BID) | OROMUCOSAL | Status: DC
  Administered 2014-03-20 – 2014-03-22 (×5): 15 mL via OROMUCOSAL
  Filled 2014-03-20 (×6): qty 15

## 2014-03-20 MED ORDER — SODIUM CHLORIDE 0.9 % IV BOLUS (SEPSIS)
500.0000 mL | Freq: Once | INTRAVENOUS | Status: AC
  Administered 2014-03-20: 500 mL via INTRAVENOUS

## 2014-03-20 MED ORDER — BIOTENE DRY MOUTH MT LIQD
15.0000 mL | Freq: Two times a day (BID) | OROMUCOSAL | Status: DC
  Administered 2014-03-20 – 2014-03-22 (×6): 15 mL via OROMUCOSAL

## 2014-03-20 NOTE — Progress Notes (Signed)
Speech Language Pathology Treatment: Dysphagia  Patient Details Name: Colleen [name] MRN: [id number] DOB: 07-19-1951 Today's Date: 03/20/2014 Time: 8250-5397 SLP Time Calculation (min): 15 min  Assessment / Plan / Recommendation Clinical Impression  F/u from BSE to reassess swallow to determine PO readiness.  Oral care completed.  Patient unable to maintain LOA to administer trial PO's after several attempts.  Recommend to continue NPO status and proceed with temporary means of nutrition and hydration.  ST to follow on 02/21/14.     HPI HPI: 63 y.o. female with a history of recent stroke on Wednesday who was discharged from [facility] after having coumadin stopped for some pethechial hemorrhage associated with the hemorrhage.  PMH:  HTN, hiatal hernia, GERD, COPD, esophagitis, chronic gastritis, pna, CVA 2009.  CXR 3/20 no edema or consolidation.   02/14/14 right lower lobe infiltrate and effusion.  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 Intra-axial hematoma with estimated volume of 53 mm, plus secondary extension of hemorrhage into the right lateral ventricle.  Repeat CT 3/21 No new intracranial hemorrhage or infarct identified.   No edema or consolidation.       SLP Plan  Continue with current plan of care    Recommendations Diet recommendations: NPO Medication Administration: Via alternative means              Oral Care Recommendations: Oral care Q4 per protocol Follow up Recommendations: Inpatient Rehab Plan: Continue with current plan of care    [place], [facility] 03/20/2014, 6:34 PM

## 2014-03-20 NOTE — Progress Notes (Signed)
Panda placed and verified after dietary had left for day. TF ordered per Dr. Nicole Kindred Vital @ 12ml/hr. Dietary to adjust tomorrow as needed.

## 2014-03-20 NOTE — Progress Notes (Addendum)
Stroke Team Progress Note  HISTORY 63 y.o. female with a history of recent stroke on Wednesday who was discharged from [facility] after having coumadin stopped for some pethechial hemorrhage associated with the hemorrhage. Pt was found to have right temporal-occipital region with intraventricular extension  into the right lateral ventricle    SUBJECTIVE More somulent this AM. Hold TF for now and repeat stat CtH  OBJECTIVE Most recent Vital Signs: Filed Vitals:   03/20/14 0715 03/20/14 0736 03/20/14 0800 03/20/14 0828  BP: 132/68  150/70 139/69  Pulse: 85  73 77  Temp:  97.3 F (36.3 C)    TempSrc:  Axillary    Resp:   23 21  Height:      Weight:      SpO2: 93%  97% 96%   CBG (last 3)   Recent Labs  03/19/14 1953 03/20/14 0411 03/20/14 0734  GLUCAP 119* 120* 115*    IV Fluid Intake:   . 0.9 % NaCl with KCl 20 mEq / L 100 mL/hr (03/20/14 0123)  . labetalol (NORMODYNE) infusion Stopped (03/20/14 0730)    MEDICATIONS  . antiseptic oral rinse  15 mL Mouth Rinse q12n4p  . chlorhexidine  15 mL Mouth Rinse BID  . digoxin  0.125 mg Oral Daily  . DULoxetine  120 mg Oral Daily  . insulin aspart  2-6 Units Subcutaneous 6 times per day  . methimazole  5 mg Oral Daily  . mometasone-formoterol  2 puff Inhalation BID  . pantoprazole (PROTONIX) IV  40 mg Intravenous QHS  . tiotropium  18 mcg Inhalation Daily   PRN:  acetaminophen, acetaminophen, labetalol  Diet:  NPO  Activity:  Bedrest DVT Prophylaxis:  SCDs  CLINICALLY SIGNIFICANT STUDIES Basic Metabolic Panel:   Recent Labs Lab 03/18/14 1057 03/18/14 1109  NA 133* 133*  K 4.3 4.1  CL 87* 94*  CO2 24  --   GLUCOSE 207* 200*  BUN 13 15  CREATININE 0.49* 0.60  CALCIUM 10.0  --    Liver Function Tests:   Recent Labs Lab 03/18/14 1057  AST 36  ALT 13  ALKPHOS 156*  BILITOT 0.8  PROT 8.9*  ALBUMIN 4.0   CBC:   Recent Labs Lab 03/18/14 1057 03/18/14 1109  WBC 22.1*  --   NEUTROABS 19.9*  --    HGB 15.0 18.4*  HCT 48.3* 54.0*  MCV 71.8*  --   PLT 381  --    Coagulation:   Recent Labs Lab 03/19/14 0045 03/19/14 0550 03/19/14 1156 03/20/14 0248  LABPROT 13.3 13.3 13.2 13.8  INR 1.03 1.03 1.02 1.08   Cardiac Enzymes: No results found for this basename: CKTOTAL, CKMB, CKMBINDEX, TROPONINI,  in the last 168 hours Urinalysis:   Recent Labs Lab 03/19/14 0620  COLORURINE AMBER*  LABSPEC 1.018  PHURINE 6.5  GLUCOSEU 100*  HGBUR NEGATIVE  BILIRUBINUR SMALL*  KETONESUR 15*  PROTEINUR >300*  UROBILINOGEN 1.0  NITRITE NEGATIVE  LEUKOCYTESUR NEGATIVE   Lipid Panel    Component Value Date/Time   CHOL 159 03/19/2014 0550   TRIG 134 03/19/2014 0550   HDL 58 03/19/2014 0550   CHOLHDL 2.7 03/19/2014 0550   VLDL 27 03/19/2014 0550   LDLCALC 74 03/19/2014 0550   HgbA1C  Lab Results  Component Value Date   HGBA1C 6.1* 03/19/2014    Urine Drug Screen:     Component Value Date/Time   LABOPIA POSITIVE* 12/07/2013 2131   COCAINSCRNUR NONE DETECTED 12/07/2013 2131   LABBENZ NONE  DETECTED 12/07/2013 2131   AMPHETMU NONE DETECTED 12/07/2013 2131   THCU NONE DETECTED 12/07/2013 2131   LABBARB NONE DETECTED 12/07/2013 2131    Alcohol Level: No results found for this basename: ETH,  in the last 168 hours  Ct Head Wo Contrast  03/19/2014   CLINICAL DATA:  Intra cerebral hemorrhage  EXAM: CT HEAD WITHOUT CONTRAST  TECHNIQUE: Contiguous axial images were obtained from the base of the skull through the vertex without intravenous contrast.  COMPARISON:  Prior CT from 03/18/2014.  FINDINGS: Intraparenchymal hematoma involving the right temporal occipital region is overall not significantly changed measuring 3.7 x 6.4 cm, previously 3.7 x 6.4 cm on 03/18/2014. Intraventricular extension into the right lateral ventricle is similar as well. There is partial effacement of the right lateral ventricle with persistent 5 mm right-to-left midline shift. Asymmetric enlargement of the temporal horns of  the lateral ventricles is similar to prior, right greater than left. No hydrocephalus. Crowding of the right perimesencephalic cistern is stable. Basilar cisterns overall remain patent. Diffuse cortical swelling with loss of sulcation again seen within the right cerebral hemisphere, compatible with generalized edema, not significantly changed.  No new intracranial hemorrhage. No new large vessel territory infarct. No extra-axial fluid collection. No mass lesion.  Calvarium remains intact. Orbits are within normal limits. Visualized scalp soft tissues unremarkable.  Paranasal sinus disease involving the maxillary sinuses is similar to prior. Small right mastoid effusion noted.  IMPRESSION: 1. No significant interval change in intra-axial hematoma involving the right temporal-occipital region with intraventricular extension into the right lateral ventricle. The intraparenchymal hematoma measures 6.4 x 3.7 cm on today's exam, previously 6.4 x 3.7 cm on 03/18/2014. 2. Similar appearance of diffuse edema involving the right cerebral hemisphere. Similar leftward midline shift of 5 mm. 3. Similar enlargement of the temporal horns of the lateral ventricles bilaterally. No hydrocephalus. Basilar cisterns remain patent. 4. No new intracranial hemorrhage or infarct identified.   Electronically Signed   By: Jeannine Boga M.D.   On: 03/19/2014 06:40   Ct Head (brain) Wo Contrast  03/18/2014   CLINICAL DATA:  63 year old female with left side deficit. Code stroke. Initial encounter.  EXAM: CT HEAD WITHOUT CONTRAST  TECHNIQUE: Contiguous axial images were obtained from the base of the skull through the vertex without intravenous contrast.  COMPARISON:  [facility] brain MRI 03/16/2014.  FINDINGS: At the site of the recently seen right occipital lobe white matter infarct there is now all large intra-axial hemorrhage with secondary extension into the right lateral ventricle. The parenchymal component of hemorrhage  encompasses about 64 x 37 mm (estimated volume 53 mL).  Motion artifact. Leftward midline shift of approximately 5 mm. Mild enlargement of the temporal horns compared to the recent MRI.  Loss of sulci in the right hemisphere consistent with more generalized cerebral edema. Basilar cisterns remain patent. Stable gray-white matter differentiation elsewhere.  No acute osseous abnormality identified. Visualized orbit soft tissues are within normal limits. Visualized scalp soft tissues are within normal limits. Continued in mildly progressed paranasal sinus inflammatory changes.  IMPRESSION: 1. 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 Intra-axial hematoma with estimated volume of 53 mm, plus secondary extension of hemorrhage into the right lateral ventricle. 2. Generalized right hemisphere edema. Leftward midline shift of 5 mm. 3. Mild enlargement of the temporal horns. Salient critical findings on this exam reviewed in person with Dr. Mike Craze on 03/18/2014 at 11:08AM .   Electronically  Signed   By: Lars Pinks M.D.   On: 03/18/2014 11:15   Dg Chest Port 1 View  03/18/2014   CLINICAL DATA:  Altered mental status  EXAM: PORTABLE CHEST - 1 VIEW  COMPARISON:  February 14, 2014  FINDINGS: Lungs are clear. Heart is upper normal in size with normal pulmonary vascularity. No adenopathy. No pneumothorax. No bone lesions.  IMPRESSION: No edema or consolidation.   Electronically Signed   By: Lowella Grip M.D.   On: 03/18/2014 11:25    CT of the brain  Malignant hemorrhagic transformation of the right occipital lobe  infarct first identified by MRI on 03/16/2014. Intra-axial hematoma  with estimated volume of 53 mm, plus secondary extension of  hemorrhage into the right lateral ventricle.   MRI of the brain    MRA of the brain    2D Echocardiogram  Left ventricle: The cavity size was normal. Wall thickness was normal. Systolic function  was normal. The estimated ejection fraction was in the range of 55% to 60%. Wall motion was normal; there were no regional wall motion abnormalities.   Carotid Doppler    CXR    EKG  A-fib.   Therapy Recommendations pendnig  Physical Exam  General: in bed  Resp: non-laboured breathing  CV: Irregular  Abd: NT, ND  Ext: no edema  Mental Status:  Patient is awake, alert, oriented to person Patient is able to give a clear and coherent history.  No signs of aphasia, but does have left neglect  Cranial Nerves:  II: L hemianopia. Pupils are equal, round, and reactive to light. Discs are difficult to visualize.  III,IV, VI: EOMI without ptosis or diploplia.  V: Facial sensation is symmetric to temperature  VII: Facial movement is decreased on left  VIII: hearing is intact to voice  X: Uvula elevates symmetrically  XI: Shoulder shrug is symmetric.  XII: tongue is midline without atrophy or fasciculations.  Motor:  Tone is normal. Bulk is normal. 5/5 strength was present on the right, mild 4/5 weakness on the left.  Sensory:  Sensation is decreased on the left, does extinguish to DSS  Deep Tendon Reflexes:  2+ and symmetric in the biceps and patellae.  Cerebellar:  FNF and HKS are intact on right  Gait:  Not assessed g.   ASSESSMENT Colleen [name] is a 63 y.o. female with hemorrhagic conversion of an ischemic infarct. It is unclear to me how much workup for her infarct was done at [facility]. She will need strict BP control and reversal of her anticoagulation  Pt is more solmulent this AM, but does wake up with painful stimuli   Hemorrhagic stroke conversion s/p PPC  LDL 74  HbA1c pending  DM   Hospital day # 2  TREATMENT/PLAN  No antiplatelet due to hemorrahge  Con't IVF, had poor urinary output, started on hydration   Stat labs ordered to follow sodium which was low and creat.   Place NG tube  SCDs DVT prophylaxis.   S/p [facility] with INR  1.03  hbA1c  Echo  Carotid dopplers  Appreciate Neurosurgery eval. Midline shift but due to good mentation does not need eVD  Tele  SSI for DM  Hold PT today as worsening mental status  Speech  Possibly start TF later in the day  03/20/2014 8:49 AM  I have personally obtained a history, examined the patient, evaluated imaging results, and formulated the assessment and plan of care. I agree with the above.  Addendum: By late afternoon mental status has improved. CTH no change in midline shift. Will hold off hypertonic saline  To contact Stroke Continuity provider, please refer to [email or web address] After hours, contact General Neurology

## 2014-03-20 NOTE — Progress Notes (Signed)
PT Cancellation Note  Patient Details Name: Colleen [name] MRN: [id number] DOB: 1951-05-11   Cancelled Treatment:    Reason Eval/Treat Not Completed: Medical issues which prohibited therapy;Patient not medically ready. Pt continues to be on bedrest. Will hold on evaluation until neruology  updates activity orders, when medically ready.    Elie Confer [place], [place] 03/20/2014, 8:42 AM

## 2014-03-21 ENCOUNTER — Inpatient Hospital Stay (HOSPITAL_COMMUNITY): Payer: Medicaid Other

## 2014-03-21 LAB — GLUCOSE, CAPILLARY
GLUCOSE-CAPILLARY: 109 mg/dL — AB (ref 70–99)
GLUCOSE-CAPILLARY: 110 mg/dL — AB (ref 70–99)
GLUCOSE-CAPILLARY: 121 mg/dL — AB (ref 70–99)
Glucose-Capillary: 114 mg/dL — ABNORMAL HIGH (ref 70–99)
Glucose-Capillary: 123 mg/dL — ABNORMAL HIGH (ref 70–99)
Glucose-Capillary: 145 mg/dL — ABNORMAL HIGH (ref 70–99)

## 2014-03-21 LAB — PROTIME-INR
INR: 1.04 (ref 0.00–1.49)
Prothrombin Time: 13.4 seconds (ref 11.6–15.2)

## 2014-03-21 MED ORDER — METOPROLOL TARTRATE 50 MG PO TABS
75.0000 mg | ORAL_TABLET | Freq: Two times a day (BID) | ORAL | Status: DC
  Administered 2014-03-21 – 2014-03-23 (×6): 75 mg via ORAL
  Filled 2014-03-21 (×10): qty 1

## 2014-03-21 MED ORDER — VITAL AF 1.2 CAL PO LIQD
1000.0000 mL | ORAL | Status: DC
  Administered 2014-03-21 – 2014-03-27 (×9): 1000 mL
  Filled 2014-03-21 (×11): qty 1000

## 2014-03-21 MED ORDER — DILTIAZEM 12 MG/ML ORAL SUSPENSION
30.0000 mg | Freq: Four times a day (QID) | ORAL | Status: DC
  Administered 2014-03-21 – 2014-03-27 (×25): 30 mg
  Filled 2014-03-21 (×29): qty 3

## 2014-03-21 MED ORDER — ATORVASTATIN CALCIUM 20 MG PO TABS
20.0000 mg | ORAL_TABLET | Freq: Every evening | ORAL | Status: DC
  Administered 2014-03-21 – 2014-03-24 (×4): 20 mg via ORAL
  Filled 2014-03-21 (×5): qty 1

## 2014-03-21 MED ORDER — DEXTROSE 5 % IV SOLN
500.0000 mg | Freq: Three times a day (TID) | INTRAVENOUS | Status: DC
  Administered 2014-03-21 – 2014-03-29 (×23): 500 mg via INTRAVENOUS
  Filled 2014-03-21 (×27): qty 5

## 2014-03-21 MED ORDER — FUROSEMIDE 40 MG PO TABS
40.0000 mg | ORAL_TABLET | Freq: Every day | ORAL | Status: DC
  Administered 2014-03-21 – 2014-03-23 (×3): 40 mg via ORAL
  Filled 2014-03-21 (×7): qty 1

## 2014-03-21 MED ORDER — VALPROATE SODIUM 500 MG/5ML IV SOLN
1.0000 g | Freq: Once | INTRAVENOUS | Status: AC
  Administered 2014-03-21: 1000 mg via INTRAVENOUS
  Filled 2014-03-21: qty 10

## 2014-03-21 NOTE — Progress Notes (Signed)
EEG completed; results pending.    

## 2014-03-21 NOTE — Procedures (Signed)
History: 63 year old female with a history of right parietal hemorrhage and altered mental status.  Technique: This is a 17 channel routine scalp EEG performed at the bedside with bipolar and monopolar montages arranged in accordance to the international 10/20 system of electrode placement. One channel was dedicated to EKG recording.    Background: The EEG during wakefulness consists of generalized irregular delta and theta activities with frequent bifrontal predominant triphasic waves. There are periods of brief generalized attenuation. There is a run of rhythmic sharply contoured theta activity most prominent at P4. There is another discharge that is poorly localized in onset but faint predominance in the right hemisphere fairly early. This evolves to 1 Hz sharply contoured with slow waves. This discharge is concerning for seizure.  Photic stimulation: Physiologic driving is not performed.  EEG Abnormalities: 1) 2 likely electrographic seizures, one arising from the right parietal region, the other poorly localized in onset. 2) generalized irregular slow activity 3) triphasic waves  Clinical Interpretation: This recorded 2 likely epileptic seizures addition to a moderate to severe generalized nonspecific cerebral dysfunction. There was no seizure or seizure predisposition recorded on this study.   Roland Rack, MD [facility] [phone]  If 7pm- 7am, please page neurology on call as listed in [place].

## 2014-03-21 NOTE — Evaluation (Signed)
Physical Therapy Evaluation Patient Details Name: Colleen [name] MRN: [id number] DOB: [birth year] Today's Date: 03/21/2014 Time: 6195-0932 PT Time Calculation (min): 22 min  PT Assessment / Plan / Recommendation History of Present Illness  Colleen [name] is a 63 y.o. female with a history of recent stroke on Wednesday who was discharged from [facility] after having coumadin stopped for some pethechial hemorrhage associated with the hemorrhage. Pt with R temporo-occipital hemorrhage  Clinical Impression  Pt extremely lethargic with limited participation in PT session this date. Pt with inconsistent command follow, minimal attention, and minimal eye opening this date resulting in pt requiring maximal assist for all mobility. Recommend CIR for maximal functional recovery due to pt indep. PTA however if patient remains lethargic and unable to participate pt may need SNF upon d/c.    PT Assessment  Patient needs continued PT services    Follow Up Recommendations  CIR    Does the patient have the potential to tolerate intense rehabilitation      Barriers to Discharge        Equipment Recommendations   (TBD)    Recommendations for Other Services Rehab consult   Frequency Min 3X/week    Precautions / Restrictions Precautions Precautions: Fall Restrictions Weight Bearing Restrictions: No   Pertinent Vitals/Pain VSS, showed no signs of pain      Mobility  Bed Mobility Overal bed mobility: Needs Assistance;+2 for physical assistance Bed Mobility: Supine to Sit;Sit to Supine Supine to sit: Total assist;+2 for physical assistance Sit to supine: Total assist;+2 for physical assistance General bed mobility comments: pt with no command follow, dependent Transfers Overall transfer level:  (unsafe at this time) Modified Rankin (Stroke Patients Only) Pre-Morbid Rankin Score: No symptoms Modified Rankin: Severe disability    Exercises     PT Diagnosis: Difficulty walking;Hemiplegia  non-dominant side;Generalized weakness  PT Problem List: Decreased strength;Decreased activity tolerance;Impaired tone;Decreased safety awareness;Decreased cognition;Decreased mobility;Decreased balance PT Treatment Interventions: DME instruction;Gait training;Functional mobility training;Therapeutic activities;Therapeutic exercise;Balance training     PT Goals(Current goals can be found in the care plan section) Acute Rehab PT Goals Patient Stated Goal: didn't state PT Goal Formulation: Patient unable to participate in goal setting Time For Goal Achievement: 04/04/14 Potential to Achieve Goals: Fair  Visit Information  Last PT Received On: 03/21/14 Assistance Needed: +2 History of Present Illness: Colleen [name] is a 63 y.o. female with a history of recent stroke on Wednesday who was discharged from [facility] after having coumadin stopped for some pethechial hemorrhage associated with the hemorrhage. Pt with R temporo-occipital hemorrhage       Prior [place] expects to be discharged to:: Unsure Additional Comments: pt extremely poor historian, unable to answer questions. Per pt with spouse and children Prior Function Level of Independence: Independent Communication Communication: Expressive difficulties (limited by lethargy, garbled speech) Dominant Hand: Right    Cognition  Cognition Arousal/Alertness: Lethargic Behavior During Therapy: Flat affect Overall Cognitive Status: Impaired/Different from baseline Area of Impairment: Orientation;Attention;Following commands;Awareness Orientation Level: Disoriented to;Place;Time;Situation (responded to name but unable to report first or last name) Current Attention Level:  (approaching focus, unable to maintain eye opening) Following Commands: Follows one step commands inconsistently Awareness:  (emerging intellectual) General Comments: pt required freq stimulation to open eyes, pt extremely  lethargic    Extremity/Trunk Assessment Upper Extremity Assessment Upper Extremity Assessment: LUE deficits/detail LUE Deficits / Details: flaccid, responded to pain Lower Extremity Assessment Lower Extremity Assessment: LLE deficits/detail LLE Deficits / Details:  flaccid, responded to pain Cervical / Trunk Assessment Cervical / Trunk Assessment: Normal   Balance Balance Overall balance assessment: Needs assistance Sitting-balance support: Feet supported;Single extremity supported Sitting balance-Leahy Scale: Zero Sitting balance - Comments: pt required total assist posteriorly to maintain upright position. pt with episodes of retropulsion  End of Session PT - End of Session Activity Tolerance: Patient limited by lethargy Patient left: in bed;with call bell/phone within reach;with restraints reapplied (R mitten reapplied) Nurse Communication: Mobility status  GP     Kingsley Callander 03/21/2014, 4:47 PM  Kittie Plater, PT, DPT Pager #: [phone] Office #: [phone]

## 2014-03-21 NOTE — Clinical Documentation Improvement (Signed)
Possible Clinical Conditions?  Severe Malnutrition   Protein Calorie Malnutrition Severe Protein Calorie Malnutrition Other Condition Cannot clinically determine  Supporting Information:  Per 03/21/14 Nutritional Consult note: Pt meets criteria for severe MALNUTRITION in the context of chronic illness as evidenced by severe muscle mass loss and 15% wt loss x 2 months.  INTERVENTION:  Advance Vital AF 1.2 enteral nutrition formula via NGT by 10 ml q 4 hours to goal rate of 60 ml/hr. Goal regimen will provide: 1728 kcal, 108 grams protein, 1168 ml free water.  Diet advancement per SLP discretion.  RD to continue to follow nutrition care plan.     Thank You, Serena Colonel ,RN Clinical Documentation Specialist:  [place] Information Management

## 2014-03-21 NOTE — Progress Notes (Signed)
INITIAL NUTRITION ASSESSMENT  DOCUMENTATION CODES Per approved criteria  -Severe malnutrition in the context of chronic illness   INTERVENTION: Advance Vital AF 1.2 enteral nutrition formula via NGT by 10 ml q 4 hours to goal rate of 60 ml/hr. Goal regimen will provide: 1728 kcal, 108 grams protein, 1168 ml free water. Diet advancement per SLP discretion. RD to continue to follow nutrition care plan.  NUTRITION DIAGNOSIS: Inadequate oral intake related to inability to eat as evidenced by NPO status.   Goal: Intake to meet >90% of estimated nutrition needs.  Monitor:  weight trends, lab trends, I/O's, enteral nutrition tolerance  Reason for Assessment: Consult for TF Initiation/Management  63 y.o. female  Admitting Dx: hemorrhagic conversion of an ischemic infarct  ASSESSMENT: PMHx significant for recent CVA, GERD, IBS, HTN, diverticulosis. Admitted with worsening L sided weakness. Work-up reveals hemorrhagic conversion of an ischemic infarct.  Evaluated by neurosurgery on 3/20 with plans for no surgical interventions.  BSE completed by SLP on 3/21 with recommendations for NPO status. Panda tube placed 3/22 (tip overlying the expected region of the distal stomach per abdominal xray from 3/22) with initiation of Vital AF 1.2 at 15 ml/hr. RD to advance. RN reports that pt is tolerating well with no residuals.  Pt with 15% wt loss x 2 months, this is significant for this time frame. Pt unable to respond to my questions. No family at bedside.  Nutrition Focused Physical Exam:  Subcutaneous Fat:  Orbital Region: WNL Upper Arm Region: moderate depletion Thoracic and Lumbar Region: n/a  Muscle:  Temple Region: moderate depletion Clavicle Bone Region: moderate depletion Clavicle and Acromion Bone Region: n/a Scapular Bone Region: n/a Dorsal Hand: n/a Patellar Region: moderate depletion Anterior Thigh Region: severe depletion Posterior Calf Region: severe depletion  Edema:  none  Pt meets criteria for severe MALNUTRITION in the context of chronic illness as evidenced by severe muscle mass loss and 15% wt loss x 2 months.  Sodium is low at 135 Ordered for NS with KCl at 100 ml/hr CBG's: 110, 114, 109 HgbA1c is 6.1  Height: Ht Readings from Last 1 Encounters:  03/18/14 5\' 5"  (1.651 m)    Weight: Wt Readings from Last 1 Encounters:  03/18/14 148 lb 9.4 oz (67.4 kg)    Ideal Body Weight: 125 lb  % Ideal Body Weight: 118%  Wt Readings from Last 10 Encounters:  03/18/14 148 lb 9.4 oz (67.4 kg)  02/19/14 194 lb 7.1 oz (88.2 kg)  01/25/14 179 lb (81.194 kg)  01/17/14 177 lb (80.287 kg)  01/06/14 175 lb 0.7 oz (79.4 kg)  01/06/14 175 lb 0.7 oz (79.4 kg)  01/04/14 179 lb (81.194 kg)  12/08/13 184 lb 15.5 oz (83.9 kg)  10/03/10 201 lb (91.173 kg)  08/08/10 203 lb (92.08 kg)    Usual Body Weight: 175 lb  % Usual Body Weight: 85%  BMI:  Body mass index is 24.73 kg/(m^2). WNL  Estimated Nutritional Needs: Kcal: 1600 - 1900 Protein: 90 - 105 g Fluid: 1.6 - 1.9 liters daily  Skin: intact  Diet Order: NPO  EDUCATION NEEDS: -No education needs identified at this time   Intake/Output Summary (Last 24 hours) at 03/21/14 0914 Last data filed at 03/21/14 0800  Gross per 24 hour  Intake 2964.38 ml  Output    565 ml  Net 2399.38 ml    Last BM: 3/22  Labs:   Recent Labs Lab 03/18/14 1057 03/18/14 1109 03/20/14 1104  NA 133* 133* 135*  K 4.3 4.1 4.2  CL 87* 94* 99  CO2 24  --  21  BUN 13 15 18   CREATININE 0.49* 0.60 0.55  CALCIUM 10.0  --  9.0  GLUCOSE 207* 200* 111*    CBG (last 3)   Recent Labs  03/21/14 0014 03/21/14 0353 03/21/14 0735  GLUCAP 110* 114* 109*   Lab Results  Component Value Date   HGBA1C 6.1* 03/19/2014    Scheduled Meds: . antiseptic oral rinse  15 mL Mouth Rinse q12n4p  . chlorhexidine  15 mL Mouth Rinse BID  . digoxin  0.125 mg Oral Daily  . DULoxetine  120 mg Oral Daily  . feeding  supplement (VITAL AF 1.2 CAL)  1,000 mL Per Tube Q24H  . insulin aspart  2-6 Units Subcutaneous 6 times per day  . methimazole  5 mg Oral Daily  . mometasone-formoterol  2 puff Inhalation BID  . pantoprazole (PROTONIX) IV  40 mg Intravenous QHS  . tiotropium  18 mcg Inhalation Daily    Continuous Infusions: . 0.9 % NaCl with KCl 20 mEq / L 100 mL/hr at 03/21/14 0814  . labetalol (NORMODYNE) infusion 1.5 mg/min (03/21/14 0835)    Past Medical History  Diagnosis Date  . Hypertension   . Hiatal hernia   . Adenomatous polyp of colon   . Acute asthmatic bronchitis   . GERD (gastroesophageal reflux disease)     EGD neg 03/07/2008, but prev required dilation  . COPD (chronic obstructive pulmonary disease)   . IBS (irritable bowel syndrome)     chronic  . Urinary incontinence   . Esophagitis   . Chronic gastritis   . Duodenitis without hemorrhage 10/01/2001  . Diverticulosis   . Personal history of colonic adenomas 02/05/2008        . Anemia   . Hyperlipidemia   . Decreased peripheral vision of right eye   . Pneumonia 2011  . Hyperthyroidism   . Type II diabetes mellitus   . Stroke     a. Hx of stroke and ministrokes ~ 2009. b. TIA 11/2013, dx with AF at that time.  . Arthritis   . Depression   . On home oxygen therapy     "2L; suppose to use 24/7" (01/05/2014)  . PAF (paroxysmal atrial fibrillation)     a. Prior hx. b. Recurred 11/2013 in setting of possible TIA. Lifelong Coumadin planned. Spont converted to NSR. EF 60-65% by echo 11/2013, normal cors 2007, normal nuc 2011.   . Atrial flutter     a. Dx 12/2013. s/p DCCV. On anticoagulation.    Past Surgical History  Procedure Laterality Date  . Colonoscopy    . Upper gi endoscopy    . Cholecystectomy  1980's  . Vaginal hysterectomy  1992  . Breast biopsy Left 1987    "milk gland"  . Cardioversion N/A 01/06/2014    Procedure: CARDIOVERSION;  Surgeon: Lelon Perla, MD;  Location: [place];  Service: Cardiovascular;   Laterality: N/A;    Inda Coke MS, RD, LDN Inpatient Registered Dietitian Pager: [phone] After-hours pager: [phone]

## 2014-03-21 NOTE — Progress Notes (Signed)
Stroke Team Progress Note  HISTORY 63 y.o. 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 On 03/18/2014 she developed severe headache with nausea and vomiting and was brought to the ED where she had left-sided weakness and was found to have right temporal-occipital region with intraventricular extension into the right lateral ventricle. She was not a TPA candidate secondary to stroke with in the past 90 days and hemorrhage. Neurosurgeon consulted (Dr. Ronnald Ramp) felt no surgical intervention was necessary. She was admitted to the neuro ICU for further treatment and evaluation.  SUBJECTIVE   OBJECTIVE Most recent Vital Signs: Filed Vitals:   03/21/14 0915 03/21/14 0930 03/21/14 0941 03/21/14 0945  BP: 161/83 158/93  141/72  Pulse: 116 79 93 60  Temp:      TempSrc:      Resp: 28 21  27   Height:      Weight:      SpO2: 97% 96%  97%   CBG (last 3)   Recent Labs  03/21/14 0014 03/21/14 0353 03/21/14 0735  GLUCAP 110* 114* 109*    IV Fluid Intake:   . 0.9 % NaCl with KCl 20 mEq / L 100 mL/hr at 03/21/14 0814  . labetalol (NORMODYNE) infusion 1.5 mg/min (03/21/14 0900)    MEDICATIONS  . antiseptic oral rinse  15 mL Mouth Rinse q12n4p  . chlorhexidine  15 mL Mouth Rinse BID  . digoxin  0.125 mg Oral Daily  . DULoxetine  120 mg Oral Daily  . feeding supplement (VITAL AF 1.2 CAL)  1,000 mL Per Tube Q24H  . insulin aspart  2-6 Units Subcutaneous 6 times per day  . methimazole  5 mg Oral Daily  . mometasone-formoterol  2 puff Inhalation BID  . pantoprazole (PROTONIX) IV  40 mg Intravenous QHS  . tiotropium  18 mcg Inhalation Daily   PRN:  acetaminophen, acetaminophen, labetalol  Diet:  NPO  Activity:  Bedrest DVT Prophylaxis:  SCDs  CLINICALLY SIGNIFICANT STUDIES Basic  Metabolic Panel:   Recent Labs Lab 03/18/14 1057 03/18/14 1109 03/20/14 1104  NA 133* 133* 135*  K 4.3 4.1 4.2  CL 87* 94* 99  CO2 24  --  21  GLUCOSE 207* 200* 111*  BUN 13 15 18   CREATININE 0.49* 0.60 0.55  CALCIUM 10.0  --  9.0   Liver Function Tests:   Recent Labs Lab 03/18/14 1057  AST 36  ALT 13  ALKPHOS 156*  BILITOT 0.8  PROT 8.9*  ALBUMIN 4.0   CBC:   Recent Labs Lab 03/18/14 1057 03/18/14 1109 03/20/14 1104  WBC 22.1*  --  11.3*  NEUTROABS 19.9*  --   --   HGB 15.0 18.4* 11.6*  HCT 48.3* 54.0* 39.1  MCV 71.8*  --  75.2*  PLT 381  --  228   Coagulation:   Recent Labs Lab 03/19/14 0550 03/19/14 1156 03/20/14 0248 03/21/14 0215  LABPROT 13.3 13.2 13.8 13.4  INR 1.03 1.02 1.08 1.04   Cardiac Enzymes: No results found for this basename: CKTOTAL, CKMB, CKMBINDEX, TROPONINI,  in the last 168 hours Urinalysis:   Recent Labs Lab 03/19/14 0620  COLORURINE AMBER*  LABSPEC 1.018  PHURINE 6.5  GLUCOSEU 100*  HGBUR NEGATIVE  BILIRUBINUR SMALL*  KETONESUR 15*  PROTEINUR >300*  UROBILINOGEN 1.0  NITRITE NEGATIVE  LEUKOCYTESUR NEGATIVE   Lipid Panel    Component Value Date/Time   CHOL 159 03/19/2014 0550   TRIG 134 03/19/2014 0550   HDL 58 03/19/2014 0550   CHOLHDL 2.7 03/19/2014 0550   VLDL 27 03/19/2014 0550   LDLCALC 74 03/19/2014 0550   HgbA1C  Lab Results  Component Value Date   HGBA1C 6.1* 03/19/2014    Urine Drug Screen:     Component Value Date/Time   LABOPIA POSITIVE* 12/07/2013 2131   COCAINSCRNUR NONE DETECTED 12/07/2013 2131   LABBENZ NONE DETECTED 12/07/2013 2131   AMPHETMU NONE DETECTED 12/07/2013 2131   THCU NONE DETECTED 12/07/2013 2131   LABBARB NONE DETECTED 12/07/2013 2131    Alcohol Level: No results found for this basename: ETH,  in the last 168 hours   CT of the brain   03/20/2014    Stable parenchymal hematoma in the right temporo-occipital region, as described above.  Mild surrounding vasogenic edema, extension  into the left lateral ventricle, and 4 mm leftward midline shift, grossly unchanged.  Stable cortical effacement/loss of sulcation involving the right cerebral hemisphere, indicating cerebral edema.    03/19/2014    1. No significant interval change in intra-axial hematoma involving the right temporal-occipital region with intraventricular extension into the right lateral ventricle. The intraparenchymal hematoma measures 6.4 x 3.7 cm on today's exam, previously 6.4 x 3.7 cm on 03/18/2014. 2. Similar appearance of diffuse edema involving the right cerebral hemisphere. Similar leftward midline shift of 5 mm. 3. Similar enlargement of the temporal horns of the lateral ventricles bilaterally. No hydrocephalus. Basilar cisterns remain patent. 4. No new intracranial hemorrhage or infarct identified.    03/18/2014   1. 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 Intra-axial hematoma with estimated volume of 53 mm, plus secondary extension of hemorrhage into the right lateral ventricle. 2. Generalized right hemisphere edema. Leftward midline shift of 5 mm. 3. Mild enlargement of the temporal horns.   2D Echocardiogram  Left ventricle: The cavity size was normal. Wall thickness was normal. Systolic function was normal. The estimated ejection fraction was in the range of 55% to 60%. Wall motion was normal; there were no regional wall motion abnormalities.  Carotid Doppler  03/18/2014 1-39% internal carotid artery stenosis bilaterally. Vertebral arteries are patent with antegrade flow.  CXR  03/18/2014   No edema or consolidation.     EEG  EKG  A-fib.   Therapy Recommendations   Physical Exam  General: in bed  Resp: non-laboured breathing  CV: Irregular  Abd: NT, ND  Ext: no edema  Mental Status:  Patient is stuporous and can barely be aroused and follows only few midline commands   but does have left neglect  Cranial Nerves:  II: L  hemianopia. Pupils are equal, round, and reactive to light. Discs are difficult to visualize.  III,IV, VI: EOMI without ptosis or diploplia.  V: Facial sensation is symmetric to temperature  VII: Facial movement is decreased on left  VIII: hearing is intact to voice  X: Uvula elevates symmetrically  XI: Shoulder shrug is symmetric.  XII: tongue is midline without atrophy or fasciculations.  Motor:  Tone is normal. Bulk is normal. 5/5 strength was present on the right, significant weakness on the left.  Sensory:  Sensation is decreased on the left,    Deep Tendon Reflexes:  2+ and symmetric in the biceps and patellae.  Cerebellar:  FNF and HKS are intact on right  Gait:  Not assessed    ASSESSMENT Colleen [name] is a 63 y.o. female with hemorrhagic conversion of an ischemic infarct that occurred last week. Imaging confirms a right temporo-occipital region with intraventricular extension and cerebral edema with leftward midline shift. She had been on warfarin (now on hold), INR on arrival is 2.61. It was reversed with anticoagulation reversal protocol down to 1.04. Previous infarct felt to be embolic secondary to atrial fibrillation. Had been on coumadin in the past, now on hold d/t hemorrhage. Patient with resultant lethargy that seems out of proportion to size of hemorrhage.  atrial fibrillation - recurred in setting of TIA, placed on coumadin with plans for lifelong treatment Hypertension Hyperlipidemia, LDL 74, on lipitor 20 mg daily PTA, now on lipitor 20 md daily, goal LDL < 70 for diabetics Diabetes, HgbA1c 6.1, goal < 7.0 Decreased UOP, started on IVF Hyponatremia, 135 Hx stroke - 2009 stroke and TIA, 11/2013 TIA w/ afib  Hospital day # 3  TREATMENT/PLAN  Continue ICU level care  Check EEG for silent seizures  Follow up carotid dopplers  Wean labetolol. Goal SBP < 180  CT head in am  OOB. Therapy evals.  Burnetta Sabin, MSN, RN, ANVP-BC, ANP-BC, [facility] Pager: [phone] 03/21/2014 5:41 PM  I have personally obtained a history, examined the patient, evaluated imaging results, and formulated the assessment and plan of care. I agree with the above. Antony Contras, MD  To contact Stroke Continuity provider, please refer to [email or web address] After hours, contact General Neurology

## 2014-03-22 ENCOUNTER — Inpatient Hospital Stay (HOSPITAL_COMMUNITY): Payer: Medicaid Other

## 2014-03-22 DIAGNOSIS — G40309 Generalized idiopathic epilepsy and epileptic syndromes, not intractable, without status epilepticus: Secondary | ICD-10-CM

## 2014-03-22 DIAGNOSIS — I6330 Cerebral infarction due to thrombosis of unspecified cerebral artery: Secondary | ICD-10-CM

## 2014-03-22 LAB — GLUCOSE, CAPILLARY
GLUCOSE-CAPILLARY: 114 mg/dL — AB (ref 70–99)
GLUCOSE-CAPILLARY: 132 mg/dL — AB (ref 70–99)
Glucose-Capillary: 136 mg/dL — ABNORMAL HIGH (ref 70–99)
Glucose-Capillary: 134 mg/dL — ABNORMAL HIGH (ref 70–99)
Glucose-Capillary: 152 mg/dL — ABNORMAL HIGH (ref 70–99)
Glucose-Capillary: 130 mg/dL — ABNORMAL HIGH (ref 70–99)

## 2014-03-22 MED ORDER — LEVALBUTEROL HCL 0.63 MG/3ML IN NEBU
0.6300 mg | INHALATION_SOLUTION | RESPIRATORY_TRACT | Status: DC | PRN
  Administered 2014-03-22 – 2014-03-24 (×3): 0.63 mg via RESPIRATORY_TRACT
  Filled 2014-03-22 (×3): qty 3

## 2014-03-22 MED ORDER — LEVALBUTEROL HCL 0.63 MG/3ML IN NEBU
INHALATION_SOLUTION | RESPIRATORY_TRACT | Status: AC
  Administered 2014-03-22: 0.63 mg via RESPIRATORY_TRACT
  Filled 2014-03-22: qty 3

## 2014-03-22 MED ORDER — IPRATROPIUM BROMIDE 0.02 % IN SOLN
0.5000 mg | Freq: Four times a day (QID) | RESPIRATORY_TRACT | Status: DC | PRN
  Administered 2014-03-22 – 2014-03-24 (×5): 0.5 mg via RESPIRATORY_TRACT
  Filled 2014-03-22 (×5): qty 2.5

## 2014-03-22 MED ORDER — LEVALBUTEROL HCL 1.25 MG/0.5ML IN NEBU
1.2500 mg | INHALATION_SOLUTION | Freq: Four times a day (QID) | RESPIRATORY_TRACT | Status: DC | PRN
  Administered 2014-03-22: 1.25 mg via RESPIRATORY_TRACT
  Filled 2014-03-22: qty 0.5

## 2014-03-22 NOTE — Progress Notes (Addendum)
History: 63 year old female with altered mental status  Sedation: None  Technique: This is a 17 channel routine scalp EEG performed at the bedside with bipolar and monopolar montages arranged in accordance to the international 10/20 system of electrode placement. One channel was dedicated to EKG recording.    Background: There is movement artifact throughout most of the study affecting bilateral occipital leads, however the patient after repositioning is able to hold still and this artifact disappears. There continues to be delta and theta slowing, though markedly less than the previous day. There are no longer periods of generalized attenuation. Periodic triphasic waves have also markedly improved.  Photic stimulation: Physiologic driving is no performed  EEG Abnormalities: 1) generalized irregular slow activity  Clinical Interpretation: This EEG is most consistent with a generalized nonspecific cerebral dysfunction. This EEG represents a marked improvement compared to the EEG of yesterday. There was no seizure recorded on this study.   Roland Rack, MD [facility] [phone]  If 7pm- 7am, please page neurology on call as listed in [place].

## 2014-03-22 NOTE — Progress Notes (Signed)
EEG completed; results pending.    

## 2014-03-22 NOTE — Consult Note (Signed)
Physical Medicine and Rehabilitation Consult Reason for Consult: CVA Referring Physician: Dr. Leonie Man   HPI: Colleen [name] is a 63 y.o. right-handed female with history of COPD, hypertension and atrial fibrillation with Coumadin therapy. Patient with recent right occipital infarct on Wednesday he 03/16/2014 was seen at [facility] following a CT showed petechial hemorrhage. She was discharged to home after stopping her Coumadin. She presented to [facility] 03/18/2014 after developing severe headache with nausea vomiting and left-sided weakness. CT and imaging showed right temporal occipital region infarct with intraventricular extension into the right lateral ventricle with cerebral edema and left midline shift. She was not a TPA candidate. Noted INR on admission of 2.61 it was reversed. Followup cranial CT scan shows similar appearance of right temporoparietal 6.5 x 3.4 cm intraparenchymal hemorrhage no rebleed without hydrocephalus. 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 Followup neurology services maintained on valproate 500 mg every 8 hours. A Panda tube was placed for nutritional support. Physical therapy evaluation completed 03/21/2014 with recommendations for physical medicine rehabilitation consult.   Review of Systems  Unable to perform ROS: mental acuity   Past Medical History  Diagnosis Date  . Hypertension   . Hiatal hernia   . Adenomatous polyp of colon   . Acute asthmatic bronchitis   . GERD (gastroesophageal reflux disease)     EGD neg 03/07/2008, but prev required dilation  . COPD (chronic obstructive pulmonary disease)   . IBS (irritable bowel syndrome)     chronic  . Urinary incontinence   . Esophagitis   . Chronic gastritis   . Duodenitis without hemorrhage 10/01/2001  . Diverticulosis   . Personal history of colonic adenomas 02/05/2008        . Anemia   . Hyperlipidemia   .  Decreased peripheral vision of right eye   . Pneumonia 2011  . Hyperthyroidism   . Type II diabetes mellitus   . Stroke     a. Hx of stroke and ministrokes ~ 2009. b. TIA 11/2013, dx with AF at that time.  . Arthritis   . Depression   . On home oxygen therapy     "2L; suppose to use 24/7" (01/05/2014)  . PAF (paroxysmal atrial fibrillation)     a. Prior hx. b. Recurred 11/2013 in setting of possible TIA. Lifelong Coumadin planned. Spont converted to NSR. EF 60-65% by echo 11/2013, normal cors 2007, normal nuc 2011.   . Atrial flutter     a. Dx 12/2013. s/p DCCV. On anticoagulation.   Past Surgical History  Procedure Laterality Date  . Colonoscopy    . Upper gi endoscopy    . Cholecystectomy  1980's  . Vaginal hysterectomy  1992  . Breast biopsy Left 1987    "milk gland"  . Cardioversion N/A 01/06/2014    Procedure: CARDIOVERSION;  Surgeon: Lelon Perla, MD;  Location: [facility] OR;  Service: Cardiovascular;  Laterality: N/A;   Family History  Problem Relation Age of Onset  . Heart disease Mother   . Asthma Father   . Lung cancer Father   . Asthma Sister   . Heart disease Sister   . Colon cancer Father    Social History:  reports that she has been smoking Cigarettes.  She has a 51 pack-year smoking history. She has never used smokeless tobacco. She reports that she does not drink alcohol or use illicit drugs. Allergies:  Allergies  Allergen Reactions  .  Crestor [Rosuvastatin]   . Latex Hives  . Wellbutrin [Bupropion]    Medications Prior to Admission  Medication Sig Dispense Refill  . ALPRAZolam (XANAX) 0.5 MG tablet Take 0.5 mg by mouth at bedtime.       [name] atorvastatin (LIPITOR) 20 MG tablet Take 1 tablet (20 mg total) by mouth every evening.  30 tablet  6  . cetirizine (ZYRTEC) 10 MG tablet Take 10 mg by mouth at bedtime.       . digoxin (LANOXIN) 0.25 MG tablet Take 0.5 tablets (0.125 mg total) by mouth daily.  30 tablet  0  . diltiazem (CARDIZEM CD) 360 MG 24 hr capsule  Take 1 capsule (360 mg total) by mouth daily.  30 capsule  0  . DULoxetine (CYMBALTA) 60 MG capsule Take 120 mg by mouth daily.      . ferrous sulfate 325 (65 FE) MG tablet Take 325 mg by mouth 2 (two) times daily with a meal.      . Fluticasone-Salmeterol (ADVAIR DISKUS) 250-50 MCG/DOSE AEPB Inhale 1 puff into the lungs 2 (two) times daily.       . furosemide (LASIX) 40 MG tablet Take 40 mg by mouth daily.       [name] HYDROcodone-acetaminophen (NORCO) 7.5-325 MG per tablet Take 1 tablet by mouth every 6 (six) hours as needed for moderate pain.      [name] levalbuterol (XOPENEX HFA) 45 MCG/ACT inhaler Inhale 2 puffs into the lungs every 4 (four) hours as needed for wheezing or shortness of breath.      . levalbuterol (XOPENEX) 0.63 MG/3ML nebulizer solution Take 3 mLs (0.63 mg total) by nebulization every 3 (three) hours as needed for wheezing or shortness of breath.  3 mL  0  . metFORMIN (GLUMETZA) 1000 MG (MOD) 24 hr tablet Take 1,000 mg by mouth 2 (two) times daily with a meal.      . methimazole (TAPAZOLE) 5 MG tablet Take 5 mg by mouth daily.      . metoprolol tartrate (LOPRESSOR) 25 MG tablet Take 3 tablets (75 mg total) by mouth 2 (two) times daily.  360 tablet  0  . omeprazole (PRILOSEC) 40 MG capsule Take 40 mg by mouth 2 (two) times daily.       . solifenacin (VESICARE) 10 MG tablet Take 10 mg by mouth daily.      [name] tiotropium (SPIRIVA) 18 MCG inhalation capsule Place 18 mcg into inhaler and inhale daily.       [name] trimethoprim (TRIMPEX) 100 MG tablet Take 100 mg by mouth at bedtime.        Home: Home Living Family/patient expects to be discharged to:: Unsure Living Arrangements: Children;Spouse/significant other Additional Comments: pt extremely poor historian, unable to answer questions. Per pt with spouse and children  Functional History: Prior Function Level of Independence: Independent Functional Status:  Mobility: Bed Mobility Overal bed mobility: Needs Assistance;+2 for physical  assistance Bed Mobility: Supine to Sit;Sit to Supine Supine to sit: Total assist;+2 for physical assistance Sit to supine: Total assist;+2 for physical assistance General bed mobility comments: pt with no command follow, dependent Transfers Overall transfer level:  (unsafe at this time)      ADL:    Cognition: Cognition Overall Cognitive Status: Impaired/Different from baseline Arousal/Alertness: Lethargic Orientation Level: Oriented to person Attention: Focused Focused Attention: Impaired Focused Attention Impairment: Verbal basic Memory: Impaired Awareness: Impaired Awareness Impairment: Intellectual impairment;Emergent impairment;Anticipatory impairment Problem Solving: Impaired Problem Solving Impairment: Verbal basic Executive Function: Self  Correcting;Self Monitoring;Decision Making;Organizing Behaviors: Restless Safety/Judgment: Impaired Cognition Arousal/Alertness: Lethargic Behavior During Therapy: Flat affect Overall Cognitive Status: Impaired/Different from baseline Area of Impairment: Orientation;Attention;Following commands;Awareness Orientation Level: Disoriented to;Place;Time;Situation (responded to name but unable to report first or last name) Current Attention Level:  (approaching focus, unable to maintain eye opening) Following Commands: Follows one step commands inconsistently Awareness:  (emerging intellectual) General Comments: pt required freq stimulation to open eyes, pt extremely lethargic  Blood pressure 179/111, pulse 115, temperature 98.9 F (37.2 C), temperature source Oral, resp. rate 42, height 5\' 5"  (1.651 m), weight 67.8 kg (149 lb 7.6 oz), SpO2 95.00%. Physical Exam  Vitals reviewed. Constitutional:  Right lying in bed with mitten on right hand  HENT:  Head: Normocephalic.  ngt in place  Eyes:  Pupils sluggish to light  Neck: Normal range of motion. Neck supple. No thyromegaly present.  Cardiovascular:  Cardiac rate controlled    Respiratory: Breath sounds normal. No respiratory distress.  GI: Soft. Bowel sounds are normal. She exhibits no distension.  Neurological:  Patient is lethargic and difficult to keep awake during exam. Inconsistently follow commands with limited attention. She did grimace to deep palpation on the left arm and leg. Left inattention. Did see some slight spontaneous movement of the left arm and leg. Confused, thought she was near home, near the woods. Moves right side spontaneously  Skin: Skin is warm and dry.  Psychiatric:  Confused, a little restless    Results for orders placed during the hospital encounter of 03/18/14 (from the past 24 hour(s))  GLUCOSE, CAPILLARY     Status: Abnormal   Collection Time    03/21/14  7:35 AM      Result Value Ref Range   Glucose-Capillary 109 (*) 70 - 99 mg/dL  GLUCOSE, CAPILLARY     Status: Abnormal   Collection Time    03/21/14 11:42 AM      Result Value Ref Range   Glucose-Capillary 123 (*) 70 - 99 mg/dL  GLUCOSE, CAPILLARY     Status: Abnormal   Collection Time    03/21/14  4:07 PM      Result Value Ref Range   Glucose-Capillary 121 (*) 70 - 99 mg/dL  GLUCOSE, CAPILLARY     Status: Abnormal   Collection Time    03/21/14  8:42 PM      Result Value Ref Range   Glucose-Capillary 145 (*) 70 - 99 mg/dL   Comment 1 Notify RN    GLUCOSE, CAPILLARY     Status: Abnormal   Collection Time    03/21/14 11:54 PM      Result Value Ref Range   Glucose-Capillary 114 (*) 70 - 99 mg/dL  GLUCOSE, CAPILLARY     Status: Abnormal   Collection Time    03/22/14  3:22 AM      Result Value Ref Range   Glucose-Capillary 136 (*) 70 - 99 mg/dL   Ct Head Wo Contrast  03/22/2014   CLINICAL DATA:  Follow up stroke.  Confusion.  EXAM: CT HEAD WITHOUT CONTRAST  TECHNIQUE: Contiguous axial images were obtained from the base of the skull through the vertex without intravenous contrast.  COMPARISON:  CT HEAD W/O CM dated 03/20/2014  FINDINGS: Right temporal occipital 3.4 x  6.7 cm (transverse by AP) intraparenchymal hematoma is similar in size, morphology imaging characteristics. Surrounding low-density vasogenic edema is similar. Mild local mass effect with sulcal effacement. Similar intraventricular extension with degenerating intraventricular blood products. Similar mild prominence of left  lateral ventricle, with partial effacement of right lateral ventricle. 3 mm of right to left subfalcine herniation is relatively unchanged.  No acute large vascular territory infarct. No abnormal extra-axial fluid collections. Basal cisterns are patent though, there is mild right uncal herniation, similar. Mild calcific atherosclerosis. Nasogastric tube via right nares.  Similar paranasal sinus mucosal thickening with left maxillary sinus air-fluid level. Subcentimeter right frontal sinus osteoma. Trace soft tissue density in right mastoid tip. No skull fracture. Ocular globes and orbital contents are nonsuspicious.  IMPRESSION: Similar appearance of right temporal parietal 6.5 x 3.4 cm intraparenchymal hemorrhage with intraventricular extension, similar right left midline shift. No rebleed. No hydrocephalus.   Electronically Signed   By: Elon Alas   On: 03/22/2014 04:22   Ct Head Wo Contrast  03/20/2014   CLINICAL DATA:  Follow-up hemorrhagic CVA  EXAM: CT HEAD WITHOUT CONTRAST  TECHNIQUE: Contiguous axial images were obtained from the base of the skull through the vertex without intravenous contrast.  COMPARISON:  03/19/2014 at 0426 hr  FINDINGS: 6.5 x 3.4 cm parenchymal hematoma in the right temporo-occipital region (series 2/ image 15), previously 6.4 x 3.7 cm, unchanged. Surrounding mild vasogenic edema with stable intraventricular extension into the right lateral ventricle.  Partial effacement of the right lateral ventricle with 4 mm leftward midline shift (series 2/image 18), grossly unchanged. Crowding of the right perimesencephalic cistern. Basal cisterns remain patent.   Diffuse cortical effacement/loss of sulcation in the right cerebral hemisphere (series 2/image 23), indicating cerebral edema (series 2/image 21), grossly unchanged.  No evidence of extra-axial fluid collection.  Partial opacification of the bilateral maxillary sinuses. Partial opacification of the right mastoid air cells.  No evidence of calvarial fracture.  IMPRESSION: Stable parenchymal hematoma in the right temporo-occipital region, as described above.  Mild surrounding vasogenic edema, extension into the left lateral ventricle, and 4 mm leftward midline shift, grossly unchanged.  Stable cortical effacement/loss of sulcation involving the right cerebral hemisphere, indicating cerebral edema.   Electronically Signed   By: Julian Hy M.D.   On: 03/20/2014 09:49   Dg Abd Portable 1v  03/20/2014   CLINICAL DATA:  Feeding tube placement.  EXAM: PORTABLE ABDOMEN - 1 VIEW  COMPARISON:  DG ABD PORTABLE 1V dated 03/20/2014  FINDINGS: Weighted enteric tube has been advanced, with tip now overlying the expected region of the distal stomach. Right upper quadrant surgical clips are noted. There is no bowel dilatation. Visualized lung bases are grossly clear.  IMPRESSION: Interval advancement of feeding tube as above.   Electronically Signed   By: Logan Bores   On: 03/20/2014 17:12   Dg Abd Portable 1v  03/20/2014   CLINICAL DATA:  PANDA tube insertion.  EXAM: PORTABLE ABDOMEN - 1 VIEW  COMPARISON:  DG LUMBAR SPINE COMPLETE 4+V dated 12/12/2011; DG ABD PORTABLE 1V dated 03/20/2014  FINDINGS: A weighted feeding tube is present with tip in the region of the GE junction/gastric cardia. Right upper quadrant surgical clips are again seen. There is no evidence of bowel dilatation. Visualized lung bases are grossly clear. No acute osseous abnormality is identified.  IMPRESSION: Feeding tube tip near the GE junction.   Electronically Signed   By: Logan Bores   On: 03/20/2014 17:11    Assessment/Plan: Diagnosis: right  temporal -occipital infarct with confusion, profound left visual spatial deficits, left HP 1. Does the need for close, 24 hr/day medical supervision in concert with the patient's rehab needs make it unreasonable for this patient to be  served in a less intensive setting? Yes 2. Co-Morbidities requiring supervision/potential complications: copd, dm, htn 3. Due to bladder management, bowel management, safety, skin/wound care, disease management, medication administration, pain management and patient education, does the patient require 24 hr/day rehab nursing? Yes 4. Does the patient require coordinated care of a physician, rehab nurse, PT (1-2 hrs/day, 5 days/week), OT (1-2 hrs/day, 5 days/week) and SLP (1-2 hrs/day, 5 days/week) to address physical and functional deficits in the context of the above medical diagnosis(es)? Yes Addressing deficits in the following areas: balance, endurance, locomotion, strength, transferring, bowel/bladder control, bathing, dressing, feeding, grooming, toileting, cognition, speech, language, swallowing and psychosocial support 5. Can the patient actively participate in an intensive therapy program of at least 3 hrs of therapy per day at least 5 days per week? Yes and Potentially 6. The potential for patient to make measurable gains while on inpatient rehab is good 7. Anticipated functional outcomes upon discharge from inpatient rehab are supervision and min assist  with PT, min assist with OT, supervision and min assist with SLP. 8. Estimated rehab length of stay to reach the above functional goals is: 20-27 days 9. Does the patient have adequate social supports to accommodate these discharge functional goals? Yes 10. Anticipated D/C setting: Home 11. Anticipated post D/C treatments: HH therapy and Outpatient therapy 12. Overall Rehab/Functional Prognosis: good  RECOMMENDATIONS: This patient's condition is appropriate for continued rehabilitative care in the following  setting: CIR Patient has agreed to participate in recommended program. Yes Note that insurance prior authorization may be required for reimbursement for recommended care.  Comment: Rehab Admissions Coordinator to follow up.  Thanks,  Meredith Staggers, MD, Mellody Drown     03/22/2014

## 2014-03-22 NOTE — Clinical Documentation Improvement (Signed)
Possible Clinical Conditions? Please specify type, acuity , and any manifestations of diabetes.  _______Diabetes Type 1   or 2 _______Controlled or uncontrolled  Manifestations:  _______DM retinopathy  _______DM PVD _______DM neuropathy   _______DM nephropathy _______Other Condition _______Cannot Clinically determine   Supporting Information: Per 03/18/14 H&P, 03/19/14 & 03/20/14 MD progress notes: SSI for DM .   Thank You, Serena Colonel ,RN Clinical Documentation Specialist:  [place] [employer]

## 2014-03-22 NOTE — Progress Notes (Signed)
Patient ID: Colleen [name], female   DOB: 03/04/1951, 63 y.o.   MRN: [id number] New CT head shows no hydrocephalus, similar appearance of ICH/IVH. I will sign off, but please call if I can be of further assistance.

## 2014-03-22 NOTE — Progress Notes (Signed)
Stroke Team Progress Note  HISTORY 63 y.o. female with a history of recent right occipital infarct on Wednesday 03/16/2014 who was seen at [facility] following a fall where a CT showed pethechial hemorrhage, she was d/c home after stopping her coumadin which she takes for atrial fibrillation. On 03/18/2014 she developed severe headache with nausea and vomiting and was brought to the ED where she had left-sided weakness and was found to have right temporal-occipital region with intraventricular extension into the right lateral ventricle. She was not a TPA candidate secondary to stroke with in the past 90 days and hemorrhage. Neurosurgeon consulted (Dr. Ronnald [name]) felt no surgical intervention was necessary. She was admitted to the neuro ICU for further treatment and evaluation.  SUBJECTIVE EEG tech at the bedside. Repeating EEG. Pt much improved.  OBJECTIVE Most recent Vital Signs: Filed Vitals:   03/22/14 0500 03/22/14 0600 03/22/14 0700 03/22/14 0800  BP: 179/111 161/91 178/81 179/117  Pulse: 115 41 101 89  Temp:   99.6 F (37.6 C)   TempSrc:   Axillary   Resp: 42 40 39 40  Height:      Weight:      SpO2: 95% 99% 94% 93%   CBG (last 3)   Recent Labs  03/21/14 2354 03/22/14 0322 03/22/14 0743  GLUCAP 114* 136* 134*    IV Fluid Intake:   . 0.9 % NaCl with KCl 20 mEq / L 100 mL/hr at 03/22/14 0826  . labetalol (NORMODYNE) infusion Stopped (03/21/14 1103)    MEDICATIONS  . antiseptic oral rinse  15 mL Mouth Rinse q12n4p  . atorvastatin  20 mg Oral QPM  . chlorhexidine  15 mL Mouth Rinse BID  . digoxin  0.125 mg Oral Daily  . diltiazem  30 mg Per Tube 4 times per day  . DULoxetine  120 mg Oral Daily  . feeding supplement (VITAL AF 1.2 CAL)  1,000 mL Per Tube Q24H  . furosemide  40 mg Oral Daily  . insulin aspart  2-6 Units Subcutaneous 6 times per day  . methimazole  5 mg Oral Daily  . metoprolol tartrate  75 mg Oral BID  . mometasone-formoterol  2 puff Inhalation BID  .  pantoprazole (PROTONIX) IV  40 mg Intravenous QHS  . tiotropium  18 mcg Inhalation Daily  . valproate sodium  500 mg Intravenous 3 times per day   PRN:  acetaminophen, acetaminophen, labetalol  Diet:     Activity:  Up with assistance DVT Prophylaxis:  SCDs  CLINICALLY SIGNIFICANT STUDIES Basic Metabolic Panel:   Recent Labs Lab 03/18/14 1057 03/18/14 1109 03/20/14 1104  NA 133* 133* 135*  K 4.3 4.1 4.2  CL 87* 94* 99  CO2 24  --  21  GLUCOSE 207* 200* 111*  BUN 13 15 18   CREATININE 0.49* 0.60 0.55  CALCIUM 10.0  --  9.0   Liver Function Tests:   Recent Labs Lab 03/18/14 1057  AST 36  ALT 13  ALKPHOS 156*  BILITOT 0.8  PROT 8.9*  ALBUMIN 4.0   CBC:   Recent Labs Lab 03/18/14 1057 03/18/14 1109 03/20/14 1104  WBC 22.1*  --  11.3*  NEUTROABS 19.9*  --   --   HGB 15.0 18.4* 11.6*  HCT 48.3* 54.0* 39.1  MCV 71.8*  --  75.2*  PLT 381  --  228   Coagulation:   Recent Labs Lab 03/19/14 0550 03/19/14 1156 03/20/14 0248 03/21/14 0215  LABPROT 13.3 13.2 13.8 13.4  INR  1.03 1.02 1.08 1.04   Cardiac Enzymes: No results found for this basename: CKTOTAL, CKMB, CKMBINDEX, TROPONINI,  in the last 168 hours Urinalysis:   Recent Labs Lab 03/19/14 0620  COLORURINE AMBER*  LABSPEC 1.018  PHURINE 6.5  GLUCOSEU 100*  HGBUR NEGATIVE  BILIRUBINUR SMALL*  KETONESUR 15*  PROTEINUR >300*  UROBILINOGEN 1.0  NITRITE NEGATIVE  LEUKOCYTESUR NEGATIVE   Lipid Panel    Component Value Date/Time   CHOL 159 03/19/2014 0550   TRIG 134 03/19/2014 0550   HDL 58 03/19/2014 0550   CHOLHDL 2.7 03/19/2014 0550   VLDL 27 03/19/2014 0550   LDLCALC 74 03/19/2014 0550   HgbA1C  Lab Results  Component Value Date   HGBA1C 6.1* 03/19/2014    Urine Drug Screen:     Component Value Date/Time   LABOPIA POSITIVE* 12/07/2013 2131   COCAINSCRNUR NONE DETECTED 12/07/2013 2131   LABBENZ NONE DETECTED 12/07/2013 2131   AMPHETMU NONE DETECTED 12/07/2013 2131   THCU NONE DETECTED  12/07/2013 2131   LABBARB NONE DETECTED 12/07/2013 2131    Alcohol Level: No results found for this basename: ETH,  in the last 168 hours   CT of the brain   03/22/2014 Similar appearance of right temporal parietal 6.5 x 3.4 cm intraparenchymal hemorrhage with intraventricular extension, similar right left midline shift. No rebleed. No hydrocephalus.  03/20/2014    Stable parenchymal hematoma in the right temporo-occipital region, as described above.  Mild surrounding vasogenic edema, extension into the left lateral ventricle, and 4 mm leftward midline shift, grossly unchanged.  Stable cortical effacement/loss of sulcation involving the right cerebral hemisphere, indicating cerebral edema.    03/19/2014    1. No significant interval change in intra-axial hematoma involving the right temporal-occipital region with intraventricular extension into the right lateral ventricle. The intraparenchymal hematoma measures 6.4 x 3.7 cm on today's exam, previously 6.4 x 3.7 cm on 03/18/2014. 2. Similar appearance of diffuse edema involving the right cerebral hemisphere. Similar leftward midline shift of 5 mm. 3. Similar enlargement of the temporal horns of the lateral ventricles bilaterally. No hydrocephalus. Basilar cisterns remain patent. 4. No new intracranial hemorrhage or infarct identified.    03/18/2014   1. 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 Intra-axial hematoma with estimated volume of 53 mm, plus secondary extension of hemorrhage into the right lateral ventricle. 2. Generalized right hemisphere edema. Leftward midline shift of 5 mm. 3. Mild enlargement of the temporal horns.   2D Echocardiogram  Left ventricle: The cavity size was normal. Wall thickness was normal. Systolic function was normal. The estimated ejection fraction was in the range of 55% to 60%. Wall motion was normal; there were no regional wall motion  abnormalities.  Carotid Doppler  03/18/2014 1-39% internal carotid artery stenosis bilaterally. Vertebral arteries are patent with antegrade flow.  CXR  03/18/2014   No edema or consolidation.     EEG 03/22/2014 03/21/2014 This recorded 2 likely epileptic seizures addition to a moderate to severe generalized nonspecific cerebral dysfunction. There was no seizure or seizure predisposition recorded on this study.   EKG  A-fib.   Therapy Recommendations CIR  Physical Exam  General: in bed  Resp: non-laboured breathing  CV: Irregular  Abd: NT, ND  Ext: no edema  Mental Status:  Patient is drowsy and can easily be aroused and follows  Commands well   but does have left neglect  Cranial Nerves:  II: L hemianopia. Pupils are  equal, round, and reactive to light. Discs are difficult to visualize.  III,IV, VI: EOMI without ptosis or diploplia.  V: Facial sensation is symmetric to temperature  VII: Facial movement is decreased on left  VIII: hearing is intact to voice  X: Uvula elevates symmetrically  XI: Shoulder shrug is symmetric.  XII: tongue is midline without atrophy or fasciculations.  Motor:  Tone is normal. Bulk is normal. 5/5 strength was present on the right, significant weakness on the left.  Sensory:  Sensation is decreased on the left,    Deep Tendon Reflexes:  2+ and symmetric in the biceps and patellae.  Cerebellar:  FNF and HKS are intact on right  Gait:  Not assessed    ASSESSMENT Colleen [name] is a 63 y.o. female with hemorrhagic conversion of an ischemic infarct that occurred last week. Imaging confirms a right temporo-occipital region with intraventricular extension and cerebral edema with leftward midline shift. She had been on warfarin (now on hold), INR on arrival is 2.61. It was reversed with anticoagulation reversal protocol down to 1.04. Previous infarct felt to be embolic secondary to atrial fibrillation. Had been on coumadin in the past, now on hold d/t  hemorrhage. Patient with resultant lethargy that seemed out of proportion to size of hemorrhage - EEG revealed non-convulsive status improved on Depacon. Patient with resulting dysarthria, dysphagia and left hemiparesis.   atrial fibrillation - recurred in setting of TIA, placed on coumadin with plans for lifelong treatment Hypertension Hyperlipidemia, LDL 74, on lipitor 20 mg daily PTA, now on lipitor 20 md daily, goal LDL < 70 for diabetics Diabetes, HgbA1c 6.1, goal < 7.0 Decreased UOP, started on IVF Hyponatremia, 135 Hx stroke - 2009 stroke and TIA, 11/2013 TIA w/ afib tachycardia  Neuroconsult Colleen [name]) seen and signed off.  Hospital day # 4  TREATMENT/PLAN  Continue ICU level care  F/u EEG for resolution of seizures  Check valproic acid level and a.m.  Decrease IV fluids to [facility]  Keep Foley given history of urinary retention  Rehab consult  Colleen Sabin, MSN, RN, ANVP-BC, ANP-BC, GNP-BC [facility] Pager: [phone] 03/22/2014 9:40 AM This patient is critically ill and at significant risk of neurological worsening, death and care requires constant monitoring of vital signs, hemodynamics,respiratory and cardiac monitoring,review of multiple databases, neurological assessment, discussion with family, other specialists and medical decision making of high complexity. I spent 30 minutes of neurocritical care time  in the care of  this patient. I have personally obtained a history, examined the patient, evaluated imaging results, and formulated the assessment and plan of care. I agree with the above.  Antony Contras, MD  To contact Stroke Continuity provider, please refer to [email or web address] After hours, contact General Neurology

## 2014-03-22 NOTE — Progress Notes (Signed)
Speech Language Pathology Treatment: Cognitive-Linquistic;Dysphagia  Patient Details Name: Colleen [name] MRN: [id number] DOB: 1951-04-30 Today's Date: 03/22/2014 Time: 6812-7517 SLP Time Calculation (min): 13 min  Assessment / Plan / Recommendation Clinical Impression  Skilled dysphagia and cognitive treatment.  She continues to require max-total assist for all functional activities including spatial orientation (thinks she's on her "back porch"), problem solving, awareness and sustained attention (severe left neglect).  Pt. Did not follow simple one step commands for this SLP.  Trials puree and ice chips attempted with pt. demonstrating decreased ability to attend and attempts to refuse.  Pt. accepted puree with poor oral seal on spoon, prolonged transit and suspected delayed swallow initiation.  Currently has poor awareness and is unable to sustain attention for eating/drinking and is not appropriate for an MBS.  ST will continue to follow, however unable to determine prognosis and longer term needs at this time.   HPI HPI: 63 y.o. female with a history of recent stroke on Wednesday who was discharged from [facility] after having coumadin stopped for some pethechial hemorrhage associated with the hemorrhage.  PMH:  HTN, hiatal hernia, GERD, COPD, esophagitis, chronic gastritis, pna, CVA 2009.  CXR 3/20 no edema or consolidation.   02/14/14 right lower lobe infiltrate and effusion.  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 Intra-axial hematoma with estimated volume of 53 mm, plus secondary extension of hemorrhage into the right lateral ventricle.  Repeat CT 3/21 No new intracranial hemorrhage or infarct identified.   No edema or consolidation.    Pertinent Vitals WDL  SLP Plan  Continue with current plan of care    Recommendations Diet recommendations: NPO Medication Administration: Via alternative means             Oral Care Recommendations: Oral care Q4 per protocol Follow up Recommendations: Skilled Nursing facility Plan: Continue with current plan of care         Houston Siren M.Ed [employer] [phone]  03/22/2014

## 2014-03-22 NOTE — Progress Notes (Signed)
Occupational Therapy Evaluation Patient Details Name: Colleen [name] MRN: [id number] DOB: Sep 05, 1951 Today's Date: 03/22/2014    History of Present Illness Colleen [name] is a 63 y.o. female with a history of recent stroke on Wednesday who was discharged from [facility] after having coumadin stopped for some pethechial hemorrhage associated with the hemorrhage. Pt with R temporo-occipital hemorrhage   Clinical Impression   PTA, pt apparently independent with ADL and mobility. Pt with significant deficits as listed below. Eval somewhat limited this pm by lethargy and high RR. No family in room during eval to assess caregiver support. At this time, rec CIR to maximize independence with ADL and mobility and decreased burden of support. Pt will benefit from skilled OT services to facilitate D/C to next venue due to below deficits.     Follow Up Recommendations  CIR;Supervision/Assistance - 24 hour    Equipment Recommendations  3 in 1 bedside comode    Recommendations for Other Services Rehab consult     Precautions / Restrictions Precautions Precautions: Fall Restrictions Weight Bearing Restrictions: No      Mobility Bed Mobility Overal bed mobility: Needs Assistance;+2 for physical assistance Bed Mobility: Supine to Sit;Sit to Supine     Supine to sit: Total assist;+2 for physical assistance Sit to supine: Total assist;+2 for physical assistance   General bed mobility comments: pt with no command follow, dependent  Transfers Overall transfer level:  (unsafe at this time)                    Balance     Sitting balance-Leahy Scale: Zero Sitting balance - Comments: pt required total assist posteriorly to maintain upright position. pt with episodes of retropulsion                            ADL                     General ADL Comments: required total A for all ADL at this time     Vision                     Perception  Perception Perception Tested?: No   Praxis Praxis Praxis tested?: Not tested    Pertinent Vitals/Pain HR 92 RR 28-38 O2 2L     Hand Dominance Right   Extremity/Trunk Assessment Upper Extremity Assessment Upper Extremity Assessment: LUE deficits/detail LUE Deficits / Details: flaccid, responded to pain   Lower Extremity Assessment LLE Deficits / Details: flaccid, responded to pain       Communication Communication Communication: Expressive difficulties (limited by lethargy, garbled speech)   Cognition Arousal/Alertness: Lethargic Behavior During Therapy: Flat affect Overall Cognitive Status: Impaired/Different from baseline Area of Impairment: Orientation;Attention;Following commands;Awareness Orientation Level: Disoriented to;Place;Time;Situation (responded to name but unable to report first or last name) Current Attention Level: Focused (approaching focus, unable to maintain eye opening)   Following Commands: Follows one step commands inconsistently   Awareness: Intellectual (emerging intellectual)       General Comments       Exercises      Home Living Family/patient expects to be discharged to:: Unsure                                 Additional Comments: pt extremely poor historian, unable to answer questions. Per pt with spouse and children  Prior Functioning/Environment Level of Independence: Independent             OT Diagnosis:     OT Problem List: Decreased strength;Decreased range of motion;Decreased activity tolerance;Impaired balance (sitting and/or standing);Impaired vision/perception;Decreased coordination;Decreased safety awareness;Decreased cognition;Decreased knowledge of use of DME or AE;Cardiopulmonary status limiting activity;Impaired sensation;Impaired tone;Impaired UE functional use   OT Treatment/Interventions: Self-care/ADL training;Therapeutic exercise;Neuromuscular education;DME and/or AE instruction;Therapeutic  activities;Cognitive remediation/compensation;Visual/perceptual remediation/compensation;Patient/family education;Balance training    OT Goals(Current goals can be found in the care plan section) Acute Rehab OT Goals OT Goal Formulation: With patient Time For Goal Achievement: 04/05/14 Potential to Achieve Goals: Good  OT Frequency: Min 2X/week   Barriers to D/C:  (unsure of caregiver support)          End of Session:    Activity Tolerance: Patient limited by lethargy;Treatment limited secondary to medical complications (Comment) (limited eval due to RR 28-35 2L) Patient left: in bed;with call bell/phone within reach   Time: 9935-7017 OT Time Calculation (min): 23 min Charges:  OT General Charges $OT Visit: 1 Procedure OT Evaluation $Initial OT Evaluation Tier I: 1 Procedure OT Treatments $Self Care/Home Management : 8-22 mins G-Codes:    Yisroel Mullendore,HILLARY 03-27-14, 3:23 PM   [facility], OTR/L  [phone] 03-27-14

## 2014-03-23 ENCOUNTER — Inpatient Hospital Stay (HOSPITAL_COMMUNITY): Payer: Medicaid Other

## 2014-03-23 LAB — BLOOD GAS, ARTERIAL
Acid-Base Excess: 8.3 mmol/L — ABNORMAL HIGH (ref 0.0–2.0)
Bicarbonate: 31.9 mEq/L — ABNORMAL HIGH (ref 20.0–24.0)
Drawn by: 347621
FIO2: 0.36 %
O2 CONTENT: 3 L/min
O2 Saturation: 85.9 %
PCO2 ART: 43.1 mmHg (ref 35.0–45.0)
PH ART: 7.488 — AB (ref 7.350–7.450)
Patient temperature: 100.9
TCO2: 33.1 mmol/L (ref 0–100)
pO2, Arterial: 57.3 mmHg — ABNORMAL LOW (ref 80.0–100.0)

## 2014-03-23 LAB — GLUCOSE, CAPILLARY
GLUCOSE-CAPILLARY: 139 mg/dL — AB (ref 70–99)
Glucose-Capillary: 100 mg/dL — ABNORMAL HIGH (ref 70–99)
Glucose-Capillary: 111 mg/dL — ABNORMAL HIGH (ref 70–99)
Glucose-Capillary: 144 mg/dL — ABNORMAL HIGH (ref 70–99)
Glucose-Capillary: 150 mg/dL — ABNORMAL HIGH (ref 70–99)
Glucose-Capillary: 156 mg/dL — ABNORMAL HIGH (ref 70–99)

## 2014-03-23 LAB — VALPROIC ACID LEVEL: Valproic Acid Lvl: 70.5 ug/mL (ref 50.0–100.0)

## 2014-03-23 MED ORDER — CHLORHEXIDINE GLUCONATE 0.12 % MT SOLN
15.0000 mL | Freq: Two times a day (BID) | OROMUCOSAL | Status: DC
  Administered 2014-03-23 – 2014-03-29 (×11): 15 mL via OROMUCOSAL
  Filled 2014-03-23 (×16): qty 15

## 2014-03-23 MED ORDER — FUROSEMIDE 10 MG/ML IJ SOLN
20.0000 mg | Freq: Once | INTRAMUSCULAR | Status: AC
  Administered 2014-03-23: 20 mg via INTRAVENOUS

## 2014-03-23 MED ORDER — FUROSEMIDE 10 MG/ML IJ SOLN
INTRAMUSCULAR | Status: AC
  Filled 2014-03-23: qty 4

## 2014-03-23 MED ORDER — BIOTENE DRY MOUTH MT LIQD
15.0000 mL | Freq: Two times a day (BID) | OROMUCOSAL | Status: DC
  Administered 2014-03-23 – 2014-03-29 (×13): 15 mL via OROMUCOSAL

## 2014-03-23 MED ORDER — LEVALBUTEROL TARTRATE 45 MCG/ACT IN AERO: 2.0000 | INHALATION_SPRAY | RESPIRATORY_TRACT | Status: DC | PRN

## 2014-03-23 MED ORDER — STARCH (THICKENING) PO POWD
ORAL | Status: DC | PRN
  Filled 2014-03-23 (×2): qty 227

## 2014-03-23 NOTE — Progress Notes (Signed)
I met with pt and her stepdaughter, Colleen [name], at bedside.Patient was independent pta with COPD with home O2 at 2 liters. She has a spouse, daughter, Colleen [name], and Granddaughter that live in the home with her. She has a CNA 4 hrs per day that does light housekeeping. Pt independent without AD and does her own adls. I will follow up with Colleen [name] and her spouse. I will follow to assist with planing timing for inpt rehab admission. [phone]

## 2014-03-23 NOTE — Procedures (Signed)
Objective Swallowing Evaluation: Modified Barium Swallowing Study  Patient Details  Name: Colleen [name] MRN: [id number] Date of Birth: [birth year]  Today's Date: 03/23/2014 Time: 4627-0350 SLP Time Calculation (min): 20 min  Past Medical History:  Past Medical History  Diagnosis Date  . Hypertension   . Hiatal hernia   . Adenomatous polyp of colon   . Acute asthmatic bronchitis   . GERD (gastroesophageal reflux disease)     EGD neg 03/07/2008, but prev required dilation  . COPD (chronic obstructive pulmonary disease)   . IBS (irritable bowel syndrome)     chronic  . Urinary incontinence   . Esophagitis   . Chronic gastritis   . Duodenitis without hemorrhage 10/01/2001  . Diverticulosis   . Personal history of colonic adenomas 02/05/2008        . Anemia   . Hyperlipidemia   . Decreased peripheral vision of right eye   . Pneumonia 2011  . Hyperthyroidism   . Type II diabetes mellitus   . Stroke     a. Hx of stroke and ministrokes ~ 2009. b. TIA 11/2013, dx with AF at that time.  . Arthritis   . Depression   . On home oxygen therapy     "2L; suppose to use 24/7" (01/05/2014)  . PAF (paroxysmal atrial fibrillation)     a. Prior hx. b. Recurred 11/2013 in setting of possible TIA. Lifelong Coumadin planned. Spont converted to NSR. EF 60-65% by echo 11/2013, normal cors 2007, normal nuc 2011.   . Atrial flutter     a. Dx 12/2013. s/p DCCV. On anticoagulation.   Past Surgical History:  Past Surgical History  Procedure Laterality Date  . Colonoscopy    . Upper gi endoscopy    . Cholecystectomy  1980's  . Vaginal hysterectomy  1992  . Breast biopsy Left 1987    "milk gland"  . Cardioversion N/A 01/06/2014    Procedure: CARDIOVERSION;  Surgeon: Lelon Perla, MD;  Location: [place];  Service: Cardiovascular;  Laterality: N/A;   HPI:  63 y.o. female with a history of recent stroke on Wednesday who was discharged from [facility] after having coumadin stopped for some pethechial  hemorrhage associated with the hemorrhage.  PMH:  HTN, hiatal hernia, GERD, COPD, esophagitis, chronic gastritis, pna, CVA 2009.  CXR 3/20 no edema or consolidation.   02/14/14 right lower lobe infiltrate and effusion.  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 Intra-axial hematoma with estimated volume of 53 mm, plus secondary extension of hemorrhage into the right lateral ventricle.  Repeat CT 3/21 No new intracranial hemorrhage or infarct identified.   No edema or consolidation.      Assessment / Plan / Recommendation Clinical Impression  Dysphagia Diagnosis: Moderate pharyngeal phase dysphagia;Mild oral phase dysphagia;Moderate oral phase dysphagia Clinical impression: Pt. demonstrated mild-moderate oral and moderate sensorimotor phayrngeal dysphagia (sensory > motor).  Swallows of thin barium initated at pyriform sinuses resulting in aspiration with strong reflexive cough.  Cognitive deficits prohibits use of compensatory techniques at present.  Reduced tongue base retraction and laryngeal elevation led to minimal intermittent pharyngeal residue. Mildly prolonged oral prep and transit.  SLP recommends Dys 1 diet texture and nectar thick liquids, no straws, pills crushed and full supervion for cognitive impairments including impulsivity.  ST will follow.      Treatment Recommendation  Therapy as outlined in treatment plan below    Diet Recommendation Dysphagia 1 (Puree);Nectar-thick  liquid   Liquid Administration via: Cup;No straw Medication Administration: Crushed with puree Supervision: Patient able to self feed;Full supervision/cueing for compensatory strategies Compensations: Slow rate;Small sips/bites;Check for pocketing Postural Changes and/or Swallow Maneuvers: Seated upright 90 degrees;Upright 30-60 min after meal    Other  Recommendations Oral Care Recommendations: Oral care BID   Follow Up Recommendations   Inpatient Rehab    Frequency and Duration min 2x/week  2 weeks   Pertinent Vitals/Pain WDL                     Colleen [name] M.Ed [employer] [phone]  03/23/2014

## 2014-03-23 NOTE — Progress Notes (Signed)
Physical Therapy Treatment Patient Details Name: Colleen [name] MRN: [id number] DOB: 1951/01/19 Today's Date: 03/23/2014    History of Present Illness Colleen [name] is a 63 y.o. female with a history of recent stroke on Wednesday who was discharged from [facility] after having coumadin stopped for some pethechial hemorrhage associated with the hemorrhage. Pt with R temporo-occipital hemorrhage    PT Comments    Pt adm with the above dx. Pt demonstrated improvement in cognition and following commands. Pt continues to be lethargic throughout tx req frequent verbal and tactile cues. Pt able to tolerate two trials of standing with 2 person total A with special attention to blocking Bilat ft and knees. Pt to benefit from skilled acute PT to address deficits listed below.  Follow Up Recommendations  CIR     Equipment Recommendations       Recommendations for Other Services Rehab consult     Precautions / Restrictions Precautions Precautions: Fall Restrictions Weight Bearing Restrictions: No    Mobility  Bed Mobility Overal bed mobility: Needs Assistance;+2 for physical assistance Bed Mobility: Supine to Sit;Sit to Supine     Supine to sit: +2 for physical assistance;Total assist Sit to supine: Total assist;+2 for physical assistance   General bed mobility comments: pt req verbal and tactile cues for sequencing and instruction. pt able to initiate L LE movement to EOB following command. pt total A to maintain seated position. pt demonstrates posterior lean   Transfers Overall transfer level: Needs assistance Equipment used: 2 person hand held assist (2 person in frontal total A) Transfers: Sit to/from Stand Sit to Stand: Total assist;+2 physical assistance         General transfer comment: pt req blocking of Bilat ft and knees to prevent buckling. pt req verbal and tactile cues. pt req tactile cue for hip extension. Pt able to tolerate 2 trials of standing and 5 reps of  weightshifting in standing  Ambulation/Gait                 Stairs            Wheelchair Mobility    Modified Rankin (Stroke Patients Only) Modified Rankin (Stroke Patients Only) Pre-Morbid Rankin Score: No symptoms Modified Rankin: Severe disability     Balance Overall balance assessment: Needs assistance Sitting-balance support: Feet supported;Single extremity supported (pt req 2nd person behind pt for support and mainatin sitting) Sitting balance-Leahy Scale: Zero Sitting balance - Comments: pt required total assist posteriorly to maintain upright position. pt with episodes of retropulsion Postural control: Posterior lean Standing balance support: Bilateral upper extremity supported Standing balance-Leahy Scale: Zero Standing balance comment: pt req 2 person total A blocking Bilat knee and ft.pt tolerate 2 trials of standing and 5 reps of weightshifting                    Cognition Arousal/Alertness: Lethargic Behavior During Therapy: Flat affect Overall Cognitive Status: Impaired/Different from baseline Area of Impairment: Orientation;Attention;Following commands;Safety/judgement;Awareness Orientation Level: Disoriented to;Place;Time;Situation Current Attention Level: Focused   Following Commands: Follows one step commands inconsistently;Follows one step commands with increased time   Awareness: Intellectual   General Comments: pt req frequent verbal and tactile cues to follow commands. pt demonstrates improvement with commands.    Exercises      General Comments        Pertinent Vitals/Pain Pt reports no pain    Home Living  Prior Function            PT Goals (current goals can now be found in the care plan section) Acute Rehab PT Goals Patient Stated Goal: didn't state PT Goal Formulation: With patient Time For Goal Achievement: 04/04/14 Potential to Achieve Goals: Fair    Frequency  Min  3X/week    PT Plan Current plan remains appropriate    End of Session Equipment Utilized During Treatment: Gait belt;Oxygen Activity Tolerance: Patient limited by lethargy Patient left: in bed;with call bell/phone within reach     Time: 6734-1937 PT Time Calculation (min): 35 min  Charges:                       G Codes:      Masiyah Engen SPT 03/25/2014, 10:14 AM

## 2014-03-23 NOTE — Progress Notes (Signed)
Occupational Therapy Treatment Patient Details Name: Colleen [name] MRN: [id number] DOB: 26-May-1951 Today's Date: 03/23/2014    History of present illness Colleen [name] is a 63 y.o. female with a history of recent stroke on Wednesday who was discharged from [facility] after having coumadin stopped for some pethechial hemorrhage associated with the hemorrhage. Pt with R temporo-occipital hemorrhage   OT comments  Pt more alert this pm. R gaze preference. L neglect. Sat EOB with max A at tmies. Significatn posterior bias. Educated family on increasing attention to L. Rec CIR for rehab. Pt will benefit from skilled OT services to facilitate D/C to next venue due to below deficits.  Follow Up Recommendations  CIR;Supervision/Assistance - 24 hour    Equipment Recommendations  3 in 1 bedside comode    Recommendations for Other Services Rehab consult    Precautions / Restrictions Precautions Precautions: Fall Precaution Comments: pt with noted left sided inattention, inabiltiy to track to the Left       Mobility Bed Mobility Overal bed mobility: Needs Assistance Bed Mobility: Supine to Sit;Sit to Supine     Supine to sit: Max assist Sit to supine: Max assist      Transfers                      Balance Overall balance assessment: Needs assistance Sitting-balance support: Feet supported;Bilateral upper extremity supported Sitting balance-Leahy Scale: Zero   Postural control: Posterior lean                         ADL                     General ADL Comments: sat EOB. Significant posterior bias. unable to sustain attention to complete simple ADL at this time      Vision                     Perception     Praxis      Cognition   Behavior During Therapy: Flat affect;Restless Overall Cognitive Status: Impaired/Different from baseline Area of Impairment: Orientation;Attention;Memory;Following  commands;Safety/judgement;Awareness;Problem solving Orientation Level: Disoriented to;Place;Time;Situation Current Attention Level: Focused Memory: Decreased recall of precautions;Decreased short-term memory  Following Commands: Follows one step commands inconsistently Safety/Judgement: Decreased awareness of safety;Decreased awareness of deficits Awareness: Intellectual Problem Solving: Slow processing;Decreased initiation;Difficulty sequencing;Requires verbal cues;Requires tactile cues General Comments: confabulating    Extremity/Trunk Assessment               Exercises Other Exercises Other Exercises: activities to facilitate ant weight shift     General Comments      Pertinent Vitals/ Pain       no apparent distress   Home Living                                          Prior Functioning/Environment              Frequency Min 2X/week     Progress Toward Goals  OT Goals(current goals can now be found in the care plan section)  Progress towards OT goals: Progressing toward goals  Acute Rehab OT Goals Patient Stated Goal: didn't state OT Goal Formulation: Patient unable to participate in goal setting Potential to Achieve Goals: Good ADL Goals Pt Will Perform Grooming: with set-up;with supervision;sitting Pt Will  Perform Upper Body Bathing: with set-up;with supervision;sitting Additional ADL Goal #1: Maintain sitting balance EOB x 10 min minguard in preparation for ADL Additional ADL Goal #2: Pt will visually locate 5/5 objects in L visual field with min vc in non-distracting environmentt  Plan Discharge plan remains appropriate    End of Session    Activity Tolerance Patient limited by lethargy   Patient Left in bed;with call bell/phone within reach;with bed alarm set;with family/visitor present; restraints reapplied   Nurse Communication Mobility status        Time: 1445-1516 OT Time Calculation (min): 31 min  Charges: OT  General Charges $OT Visit: 1 Procedure OT Treatments $Self Care/Home Management : 23-37 mins  Amanpreet Delmont,HILLARY 03/23/2014, 3:20 PM  [facility], OTR/L  [phone] 03/23/2014

## 2014-03-23 NOTE — Progress Notes (Signed)
MD on call Reynolds notified of pt being in a-fib hr up to 140s pt's VS were bp 157/107. New orders to give 20mg  of  PRN labetalol IV. Will continue to monitor the pt & inform the MD of any changes in vitals. Hoover Brunette, RN

## 2014-03-23 NOTE — Progress Notes (Signed)
NUTRITION FOLLOW-UP  DOCUMENTATION CODES Per approved criteria  -Severe malnutrition in the context of chronic illness   Pt meets criteria for severe MALNUTRITION in the context of chronic illness as evidenced by severe muscle mass loss and 15% wt loss x 2 months.  INTERVENTION: Continue Vital AF 1.2 enteral nutrition formula via NGT at 60 ml/hr. Goal regimen will provide: 1728 kcal, 108 grams protein, 1168 ml free water. If IVF discontinued, recommend addition of 150 ml QID via tube. Diet advancement per SLP discretion. RD to continue to follow nutrition care plan.  NUTRITION DIAGNOSIS: Inadequate oral intake related to inability to eat as evidenced by NPO status.   Goal: Intake to meet >90% of estimated nutrition needs.  Monitor:  weight trends, lab trends, I/O's, enteral nutrition tolerance  ASSESSMENT: PMHx significant for recent CVA, GERD, IBS, HTN, diverticulosis. Admitted with worsening L sided weakness. Work-up reveals hemorrhagic conversion of an ischemic infarct.  Evaluated by neurosurgery on 3/20 with plans for no surgical interventions.  BSE completed by SLP on 3/21 with recommendations for NPO status. ST continues to follow, however pt remains inappropriate for MBS, per most recent SLP note. Panda tube placed 3/22 (tip overlying the expected region of the distal stomach per abdominal xray from 3/22) with initiation of Vital AF 1.2 on 3/22. Pt is currently receiving Vital AF 1.2 at 60 ml/hr (this is the goal rate.) RN confirms that pt is tolerating well at this time.  Evaluated by CIR.  Sodium is low at 135 Ordered for NS with KCl at 20 ml/hr CBG's: 150, 144, 132 HgbA1c is 6.1  Height: Ht Readings from Last 1 Encounters:  03/18/14 5\' 5"  (1.651 m)    Weight: Wt Readings from Last 1 Encounters:  03/23/14 150 lb 2.1 oz (68.1 kg)  Admit wt 145 lb  BMI:  Body mass index is 24.98 kg/(m^2). WNL  Estimated Nutritional Needs: Kcal: 1600 - 1900 Protein: 90 - 105  g Fluid: 1.6 - 1.9 liters daily  Skin: intact  Diet Order:   NPO  EDUCATION NEEDS: -No education needs identified at this time   Intake/Output Summary (Last 24 hours) at 03/23/14 0900 Last data filed at 03/23/14 0800  Gross per 24 hour  Intake   1953 ml  Output   1480 ml  Net    473 ml    Last BM: 3/23  Labs:   Recent Labs Lab 03/18/14 1057 03/18/14 1109 03/20/14 1104  NA 133* 133* 135*  K 4.3 4.1 4.2  CL 87* 94* 99  CO2 24  --  21  BUN 13 15 18   CREATININE 0.49* 0.60 0.55  CALCIUM 10.0  --  9.0  GLUCOSE 207* 200* 111*    CBG (last 3)   Recent Labs  03/22/14 2025 03/23/14 0006 03/23/14 0352  GLUCAP 132* 144* 150*   Lab Results  Component Value Date   HGBA1C 6.1* 03/19/2014    Scheduled Meds: . antiseptic oral rinse  15 mL Mouth Rinse q12n4p  . atorvastatin  20 mg Oral QPM  . chlorhexidine  15 mL Mouth Rinse BID  . digoxin  0.125 mg Oral Daily  . diltiazem  30 mg Per Tube 4 times per day  . DULoxetine  120 mg Oral Daily  . feeding supplement (VITAL AF 1.2 CAL)  1,000 mL Per Tube Q24H  . furosemide  40 mg Oral Daily  . insulin aspart  2-6 Units Subcutaneous 6 times per day  . methimazole  5 mg Oral Daily  .  metoprolol tartrate  75 mg Oral BID  . mometasone-formoterol  2 puff Inhalation BID  . pantoprazole (PROTONIX) IV  40 mg Intravenous QHS  . tiotropium  18 mcg Inhalation Daily  . valproate sodium  500 mg Intravenous 3 times per day    Continuous Infusions: . 0.9 % NaCl with KCl 20 mEq / L 20 mL/hr at 03/23/14 0800  . labetalol (NORMODYNE) infusion Stopped (03/21/14 1103)    Inda Coke MS, RD, LDN Inpatient Registered Dietitian Pager: [phone] After-hours pager: [phone]

## 2014-03-23 NOTE — Progress Notes (Signed)
Spoke to Dr.Stewart about pt breathing. Pt has had increased work of breathing through the night, increased rhonchi, wheezing and fine crackles. Pt has a weak cough and unable to be productive. Orders received for lasix and will keep a close monitoring of the patient to see if improvement after the dose of lasix.

## 2014-03-23 NOTE — Progress Notes (Signed)
Stroke Team Progress Note  HISTORY 63 y.o. female with a history of recent right occipital infarct on Wednesday 03/16/2014 who was seen at [facility] following a fall where a CT showed pethechial hemorrhage, she was d/c home after stopping her coumadin which she takes for atrial fibrillation. On 03/18/2014 she developed severe headache with nausea and vomiting and was brought to the ED where she had left-sided weakness and was found to have right temporal-occipital region with intraventricular extension into the right lateral ventricle. She was not a TPA candidate secondary to stroke with in the past 90 days and hemorrhage. Neurosurgeon consulted (Dr. Ronnald Ramp) felt no surgical intervention was necessary. She was admitted to the neuro ICU for further treatment and evaluation.  SUBJECTIVE   OBJECTIVE Most recent Vital Signs: Filed Vitals:   03/23/14 0500 03/23/14 0600 03/23/14 0700 03/23/14 0749  BP: 166/103 136/85 141/76   Pulse: 114 100 100   Temp:      TempSrc:      Resp: 24 22 23    Height:      Weight:      SpO2: 95% 95% 93% 92%   CBG (last 3)   Recent Labs  03/22/14 2025 03/23/14 0006 03/23/14 0352  GLUCAP 132* 144* 150*    IV Fluid Intake:   . 0.9 % NaCl with KCl 20 mEq / L 20 mL/hr at 03/23/14 0800  . labetalol (NORMODYNE) infusion Stopped (03/21/14 1103)    MEDICATIONS  . antiseptic oral rinse  15 mL Mouth Rinse q12n4p  . atorvastatin  20 mg Oral QPM  . digoxin  0.125 mg Oral Daily  . diltiazem  30 mg Per Tube 4 times per day  . DULoxetine  120 mg Oral Daily  . feeding supplement (VITAL AF 1.2 CAL)  1,000 mL Per Tube Q24H  . furosemide  40 mg Oral Daily  . insulin aspart  2-6 Units Subcutaneous 6 times per day  . methimazole  5 mg Oral Daily  . metoprolol tartrate  75 mg Oral BID  . mometasone-formoterol  2 puff Inhalation BID  . pantoprazole (PROTONIX) IV  40 mg Intravenous QHS  . tiotropium  18 mcg Inhalation Daily  . valproate sodium  500 mg Intravenous 3  times per day   PRN:  acetaminophen, acetaminophen, ipratropium, labetalol, levalbuterol  Diet:     Activity:  Up with assistance DVT Prophylaxis:  SCDs  CLINICALLY SIGNIFICANT STUDIES Basic Metabolic Panel:   Recent Labs Lab 03/18/14 1057 03/18/14 1109 03/20/14 1104  NA 133* 133* 135*  K 4.3 4.1 4.2  CL 87* 94* 99  CO2 24  --  21  GLUCOSE 207* 200* 111*  BUN 13 15 18   CREATININE 0.49* 0.60 0.55  CALCIUM 10.0  --  9.0   Liver Function Tests:   Recent Labs Lab 03/18/14 1057  AST 36  ALT 13  ALKPHOS 156*  BILITOT 0.8  PROT 8.9*  ALBUMIN 4.0   CBC:   Recent Labs Lab 03/18/14 1057 03/18/14 1109 03/20/14 1104  WBC 22.1*  --  11.3*  NEUTROABS 19.9*  --   --   HGB 15.0 18.4* 11.6*  HCT 48.3* 54.0* 39.1  MCV 71.8*  --  75.2*  PLT 381  --  228   Coagulation:   Recent Labs Lab 03/19/14 0550 03/19/14 1156 03/20/14 0248 03/21/14 0215  LABPROT 13.3 13.2 13.8 13.4  INR 1.03 1.02 1.08 1.04   Cardiac Enzymes: No results found for this basename: CKTOTAL, CKMB, CKMBINDEX, TROPONINI,  in the last 168 hours Urinalysis:   Recent Labs Lab 03/19/14 0620  COLORURINE AMBER*  LABSPEC 1.018  PHURINE 6.5  GLUCOSEU 100*  HGBUR NEGATIVE  BILIRUBINUR SMALL*  KETONESUR 15*  PROTEINUR >300*  UROBILINOGEN 1.0  NITRITE NEGATIVE  LEUKOCYTESUR NEGATIVE   Lipid Panel    Component Value Date/Time   CHOL 159 03/19/2014 0550   TRIG 134 03/19/2014 0550   HDL 58 03/19/2014 0550   CHOLHDL 2.7 03/19/2014 0550   VLDL 27 03/19/2014 0550   LDLCALC 74 03/19/2014 0550   HgbA1C  Lab Results  Component Value Date   HGBA1C 6.1* 03/19/2014    Urine Drug Screen:     Component Value Date/Time   LABOPIA POSITIVE* 12/07/2013 2131   COCAINSCRNUR NONE DETECTED 12/07/2013 2131   LABBENZ NONE DETECTED 12/07/2013 2131   AMPHETMU NONE DETECTED 12/07/2013 2131   THCU NONE DETECTED 12/07/2013 2131   LABBARB NONE DETECTED 12/07/2013 2131    Alcohol Level: No results found for this basename:  ETH,  in the last 168 hours   CT of the brain   03/22/2014 Similar appearance of right temporal parietal 6.5 x 3.4 cm intraparenchymal hemorrhage with intraventricular extension, similar right left midline shift. No rebleed. No hydrocephalus.  03/20/2014    Stable parenchymal hematoma in the right temporo-occipital region, as described above.  Mild surrounding vasogenic edema, extension into the left lateral ventricle, and 4 mm leftward midline shift, grossly unchanged.  Stable cortical effacement/loss of sulcation involving the right cerebral hemisphere, indicating cerebral edema.    03/19/2014    1. No significant interval change in intra-axial hematoma involving the right temporal-occipital region with intraventricular extension into the right lateral ventricle. The intraparenchymal hematoma measures 6.4 x 3.7 cm on today's exam, previously 6.4 x 3.7 cm on 03/18/2014. 2. Similar appearance of diffuse edema involving the right cerebral hemisphere. Similar leftward midline shift of 5 mm. 3. Similar enlargement of the temporal horns of the lateral ventricles bilaterally. No hydrocephalus. Basilar cisterns remain patent. 4. No new intracranial hemorrhage or infarct identified.    03/18/2014   1. 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 Intra-axial hematoma with estimated volume of 53 mm, plus secondary extension of hemorrhage into the right lateral ventricle. 2. Generalized right hemisphere edema. Leftward midline shift of 5 mm. 3. Mild enlargement of the temporal horns.   2D Echocardiogram  Left ventricle: The cavity size was normal. Wall thickness was normal. Systolic function was normal. The estimated ejection fraction was in the range of 55% to 60%. Wall motion was normal; there were no regional wall motion abnormalities.  Carotid Doppler  03/18/2014 1-39% internal carotid artery stenosis bilaterally. Vertebral arteries are patent with  antegrade flow.  CXR  03/18/2014   No edema or consolidation.     EEG 03/22/2014 This EEG is most consistent with a generalized nonspecific cerebral dysfunction. This EEG represents a marked improvement compared to the EEG of yesterday. There was no seizure recorded on this study.  03/21/2014 This recorded 2 likely epileptic seizures addition to a moderate to severe generalized nonspecific cerebral dysfunction. There was no seizure or seizure predisposition recorded on this study.   EKG  A-fib.   Therapy Recommendations CIR  Physical Exam  General: in bed  Resp: non-laboured breathing  CV: Irregular  Abd: NT, ND  Ext: no edema  Mental Status:  Patient is drowsy and can easily be aroused and follows  Commands well   but  does have mild  left neglect  Cranial Nerves:  II: L hemianopia. Pupils are equal, round, and reactive to light. Discs are difficult to visualize.  III,IV, VI: EOMI without ptosis or diploplia.  V: Facial sensation is symmetric to temperature  VII: Facial movement is decreased on left  VIII: hearing is intact to voice  X: Uvula elevates symmetrically  XI: Shoulder shrug is symmetric.  XII: tongue is midline without atrophy or fasciculations.  Motor:  Tone is normal. Bulk is normal. 5/5 strength was present on the right, significant weakness on the left.  Sensory:  Sensation is decreased on the left,    Deep Tendon Reflexes:  2+ and symmetric in the biceps and patellae.  Cerebellar:  FNF and HKS are intact on right  Gait:  Not assessed    ASSESSMENT Ms. Colleen [name] is a 63 y.o. female with hemorrhagic conversion of an ischemic infarct that occurred last week. Imaging confirms a right temporo-occipital region with intraventricular extension and cerebral edema with leftward midline shift. She had been on warfarin (now on hold), INR on arrival is 2.61. It was reversed with anticoagulation reversal protocol down to 1.04. Previous infarct felt to be embolic secondary  to atrial fibrillation. Had been on coumadin in the past, now on hold d/t hemorrhage. Patient with resultant lethargy that seemed out of proportion to size of hemorrhage - EEG revealed non-convulsive status improved on Depacon, level 70.5 acceptable. Patient with resulting dysarthria, dysphagia and left hemiparesis.   RN noted labored respirations during the night - increased rhonchi, wheezing and crackles, unproductive cough, given lasix. This was found to be stable during rounds by Dr. Tilden Dome.[name]  atrial fibrillation - recurred in setting of TIA, placed on coumadin with plans for lifelong treatment Hypertension Hyperlipidemia, LDL 74, on lipitor 20 mg daily PTA, now on lipitor 20 md daily, goal LDL < 70 for diabetics Diabetes Type 2, HgbA1c 6.1, goal < 7.0 Decreased UOP, started on IVF, improved Hyponatremia, 135 Hx stroke - 2009 stroke and TIA, 11/2013 TIA w/ afib tachycardia  Neuroconsult Ronnald Ramp) seen and signed off.  Hospital day # 5  TREATMENT/PLAN  Transfer to the floor  ST to follow swallow  D/c Foley   Disposition: IP Rehab, they are following  Burnetta Sabin, MSN, RN, ANVP-BC, ANP-BC, GNP-BC [facility] Pager: [phone] 03/23/2014 8:08 AM  I have personally obtained a history, examined the patient, evaluated imaging results, and formulated the assessment and plan of care. I agree with the above.  Antony Contras, MD  To contact Stroke Continuity provider, please refer to [email or web address] After hours, contact General Neurology

## 2014-03-23 NOTE — Progress Notes (Signed)
OT Cancellation Note  Patient Details Name: Colleen [name] MRN: [id number] DOB: 1951-07-24   Cancelled Treatment:    Reason Eval/Treat Not Completed: Patient at procedure or test/ unavailable (at xray. will attempt later)  [place], OTR/L  [phone] 03/23/2014 03/23/2014, 2:06 PM

## 2014-03-23 NOTE — Progress Notes (Signed)
Speech Language Pathology   Patient Details Name: Colleen [name] MRN: [id number] DOB: [birth year] Today's Date: 03/23/2014 Time:  -    Pt. More alert today with improved ability to sustain attention to speaker.  Recommend MBS which is scheduled at 1330 today.  Orbie Pyo [place].[employer] [phone]  03/23/2014

## 2014-03-23 NOTE — Progress Notes (Signed)
Physical Therapy Treatment Patient Details Name: Colleen [name] MRN: [id number] DOB: October 22, 1951 Today's Date: 03/23/2014    History of Present Illness Colleen [name] is a 63 y.o. female with a history of recent stroke on Wednesday who was discharged from [facility] after having coumadin stopped for some pethechial hemorrhage associated with the hemorrhage. Pt with R temporo-occipital hemorrhage    PT Comments    Pt adm with the above dx. Pt demonstrated improvement in cognition and following commands. Pt continues to be lethargic throughout tx req frequent verbal and tactile cues. Pt able to tolerate two trials of standing with 2 person total A with special attention to blocking Bilat ft and knees. Pt to benefit from skilled acute PT to address deficits listed below.  Follow Up Recommendations  CIR     Equipment Recommendations       Recommendations for Other Services Rehab consult     Precautions / Restrictions Precautions Precautions: Fall Restrictions Weight Bearing Restrictions: No    Mobility  Bed Mobility Overal bed mobility: Needs Assistance;+2 for physical assistance Bed Mobility: Supine to Sit;Sit to Supine     Supine to sit: +2 for physical assistance;Total assist Sit to supine: Total assist;+2 for physical assistance   General bed mobility comments: pt req verbal and tactile cues for sequencing and instruction. pt able to initiate L LE movement to EOB following command. pt total A to maintain seated position. pt demonstrates posterior lean   Transfers Overall transfer level: Needs assistance Equipment used: 2 person hand held assist (2 person in frontal total A) Transfers: Sit to/from Stand Sit to Stand: Total assist;+2 physical assistance         General transfer comment: pt req blocking of Bilat ft and knees to prevent buckling. pt req verbal and tactile cues. pt req tactile cue for hip extension. Pt able to tolerate 2 trials of standing and 5 reps of  weightshifting in standing  Ambulation/Gait                 Stairs            Wheelchair Mobility    Modified Rankin (Stroke Patients Only) Modified Rankin (Stroke Patients Only) Pre-Morbid Rankin Score: No symptoms Modified Rankin: Severe disability     Balance Overall balance assessment: Needs assistance Sitting-balance support: Feet supported;Single extremity supported (pt req 2nd person behind pt for support and mainatin sitting) Sitting balance-Leahy Scale: Zero Sitting balance - Comments: pt required total assist posteriorly to maintain upright position. pt with episodes of retropulsion Postural control: Posterior lean Standing balance support: Bilateral upper extremity supported Standing balance-Leahy Scale: Zero Standing balance comment: pt req 2 person total A blocking Bilat knee and ft.pt tolerate 2 trials of standing and 5 reps of weightshifting                    Cognition Arousal/Alertness: Lethargic Behavior During Therapy: Flat affect Overall Cognitive Status: Impaired/Different from baseline Area of Impairment: Orientation;Attention;Following commands;Safety/judgement;Awareness Orientation Level: Disoriented to;Place;Time;Situation Current Attention Level: Focused   Following Commands: Follows one step commands inconsistently;Follows one step commands with increased time   Awareness: Intellectual   General Comments: pt req frequent verbal and tactile cues to follow commands. pt demonstrates improvement with commands.    Exercises      General Comments  pt with left sided neglect and inability to track to the left      Pertinent Vitals/Pain Pt reports no pain    Home Living  Prior Function            PT Goals (current goals can now be found in the care plan section) Acute Rehab PT Goals Patient Stated Goal: didn't state PT Goal Formulation: With patient Time For Goal Achievement:  04/04/14 Potential to Achieve Goals: Fair    Frequency  Min 3X/week    PT Plan Current plan remains appropriate    End of Session Equipment Utilized During Treatment: Gait belt;Oxygen Activity Tolerance: Patient limited by lethargy Patient left: in bed;with call bell/phone within reach     Time: 4944-9675 PT Time Calculation (min): 35 min  Charges:                       G Codes:      Colleen [name] April 08, 2014, 10:14 AM  Agree with above assessment.  Colleen [name], PT, DPT Pager #: [phone] Office #: [phone]

## 2014-03-24 ENCOUNTER — Inpatient Hospital Stay (HOSPITAL_COMMUNITY): Payer: Medicaid Other

## 2014-03-24 DIAGNOSIS — J9601 Acute respiratory failure with hypoxia: Secondary | ICD-10-CM

## 2014-03-24 DIAGNOSIS — J189 Pneumonia, unspecified organism: Secondary | ICD-10-CM

## 2014-03-24 DIAGNOSIS — Y95 Nosocomial condition: Secondary | ICD-10-CM

## 2014-03-24 DIAGNOSIS — I4891 Unspecified atrial fibrillation: Secondary | ICD-10-CM

## 2014-03-24 LAB — PROCALCITONIN

## 2014-03-24 LAB — BLOOD GAS, ARTERIAL
Acid-Base Excess: 7.5 mmol/L — ABNORMAL HIGH (ref 0.0–2.0)
BICARBONATE: 31.2 meq/L — AB (ref 20.0–24.0)
O2 Content: 3 L/min
O2 SAT: 95 %
Patient temperature: 99.5
TCO2: 32.5 mmol/L (ref 0–100)
pCO2 arterial: 42.7 mmHg (ref 35.0–45.0)
pH, Arterial: 7.479 — ABNORMAL HIGH (ref 7.350–7.450)
pO2, Arterial: 77.8 mmHg — ABNORMAL LOW (ref 80.0–100.0)

## 2014-03-24 LAB — MRSA PCR SCREENING: MRSA by PCR: NEGATIVE

## 2014-03-24 LAB — URINE MICROSCOPIC-ADD ON

## 2014-03-24 LAB — GLUCOSE, CAPILLARY
GLUCOSE-CAPILLARY: 124 mg/dL — AB (ref 70–99)
GLUCOSE-CAPILLARY: 100 mg/dL — AB (ref 70–99)
Glucose-Capillary: 104 mg/dL — ABNORMAL HIGH (ref 70–99)
Glucose-Capillary: 151 mg/dL — ABNORMAL HIGH (ref 70–99)
Glucose-Capillary: 141 mg/dL — ABNORMAL HIGH (ref 70–99)
Glucose-Capillary: 135 mg/dL — ABNORMAL HIGH (ref 70–99)

## 2014-03-24 LAB — URINALYSIS, ROUTINE W REFLEX MICROSCOPIC
Glucose, UA: NEGATIVE mg/dL
KETONES UR: 40 mg/dL — AB
Nitrite: POSITIVE — AB
Protein, ur: 100 mg/dL — AB
Specific Gravity, Urine: 1.023 (ref 1.005–1.030)
UROBILINOGEN UA: 4 mg/dL — AB (ref 0.0–1.0)
pH: 7 (ref 5.0–8.0)

## 2014-03-24 MED ORDER — METOPROLOL TARTRATE 50 MG PO TABS
75.0000 mg | ORAL_TABLET | Freq: Two times a day (BID) | ORAL | Status: DC
  Administered 2014-03-24 – 2014-03-25 (×2): 75 mg
  Filled 2014-03-24 (×3): qty 1

## 2014-03-24 MED ORDER — VANCOMYCIN HCL IN DEXTROSE 750-5 MG/150ML-% IV SOLN
750.0000 mg | Freq: Two times a day (BID) | INTRAVENOUS | Status: DC
  Administered 2014-03-25: 750 mg via INTRAVENOUS
  Filled 2014-03-24 (×2): qty 150

## 2014-03-24 MED ORDER — DIGOXIN 125 MCG PO TABS
0.1250 mg | ORAL_TABLET | Freq: Every day | ORAL | Status: DC
  Administered 2014-03-25 – 2014-03-28 (×4): 0.125 mg via NASOGASTRIC
  Filled 2014-03-24 (×4): qty 1

## 2014-03-24 MED ORDER — PIPERACILLIN-TAZOBACTAM 3.375 G IVPB
3.3750 g | Freq: Three times a day (TID) | INTRAVENOUS | Status: DC
  Administered 2014-03-24 – 2014-03-29 (×16): 3.375 g via INTRAVENOUS
  Filled 2014-03-24 (×19): qty 50

## 2014-03-24 MED ORDER — SODIUM CHLORIDE 0.9 % IV SOLN
INTRAVENOUS | Status: DC
  Administered 2014-03-24: 15:00:00 via INTRAVENOUS
  Administered 2014-03-26: 250 mL via INTRAVENOUS

## 2014-03-24 MED ORDER — FUROSEMIDE 40 MG PO TABS
40.0000 mg | ORAL_TABLET | Freq: Every day | ORAL | Status: DC
  Administered 2014-03-25: 40 mg
  Filled 2014-03-24: qty 1

## 2014-03-24 MED ORDER — ATORVASTATIN CALCIUM 20 MG PO TABS
20.0000 mg | ORAL_TABLET | Freq: Every evening | ORAL | Status: DC
  Administered 2014-03-25 – 2014-03-27 (×3): 20 mg
  Filled 2014-03-24 (×4): qty 1

## 2014-03-24 MED ORDER — VANCOMYCIN HCL 10 G IV SOLR
1250.0000 mg | Freq: Once | INTRAVENOUS | Status: AC
  Administered 2014-03-24: 1250 mg via INTRAVENOUS
  Filled 2014-03-24: qty 1250

## 2014-03-24 MED ORDER — FAMOTIDINE IN NACL 20-0.9 MG/50ML-% IV SOLN
20.0000 mg | Freq: Two times a day (BID) | INTRAVENOUS | Status: DC
  Administered 2014-03-24 (×2): 20 mg via INTRAVENOUS
  Filled 2014-03-24 (×4): qty 50

## 2014-03-24 MED ORDER — METHIMAZOLE 5 MG PO TABS
5.0000 mg | ORAL_TABLET | Freq: Every day | ORAL | Status: DC
  Administered 2014-03-25 – 2014-03-28 (×4): 5 mg
  Filled 2014-03-24 (×4): qty 1

## 2014-03-24 MED ORDER — NICARDIPINE HCL IN NACL 20-0.86 MG/200ML-% IV SOLN
3.0000 mg/h | INTRAVENOUS | Status: DC
  Administered 2014-03-24: 5 mg/h via INTRAVENOUS
  Administered 2014-03-24: 3.5 mg/h via INTRAVENOUS
  Filled 2014-03-24 (×2): qty 200

## 2014-03-24 NOTE — Progress Notes (Signed)
ANTIBIOTIC CONSULT NOTE - INITIAL  Pharmacy Consult:  Vancomycin / Zosyn Indication:  HCAP  Allergies  Allergen Reactions  . Crestor [Rosuvastatin]   . Latex Hives  . Wellbutrin [Bupropion]     Patient Measurements: Height: 5\' 5"  (165.1 cm) Weight: 151 lb 3.8 oz (68.6 kg) IBW/kg (Calculated) : 57  Vital Signs: Temp: 98.3 F (36.8 C) (03/26 0732) Temp src: Axillary (03/26 0400) BP: 141/86 mmHg (03/26 0915) Pulse Rate: 102 (03/26 0915) Intake/Output from previous day: 03/25 0701 - 03/26 0700 In: 473.3 [I.V.:118.3; NG/GT:355] Out: 130 [Urine:130] Intake/Output from this shift: Total I/O In: 20 [I.V.:20] Out: -   Labs: No results found for this basename: WBC, HGB, PLT, LABCREA, CREATININE,  in the last 72 hours Estimated Creatinine Clearance: 70 ml/min (by C-G formula based on Cr of 0.55). No results found for this basename: VANCOTROUGH, Corlis Leak, VANCORANDOM, [place], GENTPEAK, GENTRANDOM, TOBRATROUGH, TOBRAPEAK, TOBRARND, AMIKACINPEAK, AMIKACINTROU, AMIKACIN,  in the last 72 hours   Microbiology: Recent Results (from the past 720 hour(s))  MRSA PCR SCREENING     Status: None   Collection Time    03/18/14  5:38 PM      Result Value Ref Range Status   MRSA by PCR NEGATIVE  NEGATIVE Final   Comment:            The GeneXpert MRSA Assay (FDA     approved for NASAL specimens     only), is one component of a     comprehensive MRSA colonization     surveillance program. It is not     intended to diagnose MRSA     infection nor to guide or     monitor treatment for     MRSA infections.  MRSA PCR SCREENING     Status: None   Collection Time    03/24/14  9:06 AM      Result Value Ref Range Status   MRSA by PCR NEGATIVE  NEGATIVE Final   Comment:            The GeneXpert MRSA Assay (FDA     approved for NASAL specimens     only), is one component of a     comprehensive MRSA colonization     surveillance program. It is not     intended to diagnose MRSA   infection nor to guide or     monitor treatment for     MRSA infections.    Medical History: Past Medical History  Diagnosis Date  . Hypertension   . Hiatal hernia   . Adenomatous polyp of colon   . Acute asthmatic bronchitis   . GERD (gastroesophageal reflux disease)     EGD neg 03/07/2008, but prev required dilation  . COPD (chronic obstructive pulmonary disease)   . IBS (irritable bowel syndrome)     chronic  . Urinary incontinence   . Esophagitis   . Chronic gastritis   . Duodenitis without hemorrhage 10/01/2001  . Diverticulosis   . Personal history of colonic adenomas 02/05/2008        . Anemia   . Hyperlipidemia   . Decreased peripheral vision of right eye   . Pneumonia 2011  . Hyperthyroidism   . Type II diabetes mellitus   . Stroke     a. Hx of stroke and ministrokes ~ 2009. b. TIA 11/2013, dx with AF at that time.  . Arthritis   . Depression   . On home oxygen therapy     "2L;  suppose to use 24/7" (01/05/2014)  . PAF (paroxysmal atrial fibrillation)     a. Prior hx. b. Recurred 11/2013 in setting of possible TIA. Lifelong Coumadin planned. Spont converted to NSR. EF 60-65% by echo 11/2013, normal cors 2007, normal nuc 2011.   . Atrial flutter     a. Dx 12/2013. s/p DCCV. On anticoagulation.      Assessment: Colleen [name] admitted on 03/18/14 with left-sided weakness, found to have hemorrhagic conversion of ischemic stroke.  Patient transferred to the ICU today 03/24/14 with respiratory distress, elevated blood pressure and Afib with RVR.  Pharmacy consulted to manage vancomycin and Zosyn for HCAP.  Patient's renal function has been stable.   Goal of Therapy:  Vancomycin trough level 15-20 mcg/ml   Plan:  - Vanc 1250mg  IV x 1, then 750mg  IV Q12H - Zosyn 3.375gm IV Q8H, 4 hr infusion - Monitor renal fxn, clinical course, vanc trough as indicated - BMET in AM    Kaidence Sant D. Mina Marble, PharmD, BCPS Pager:  [phone] 03/24/2014, 11:40 AM

## 2014-03-24 NOTE — Progress Notes (Signed)
During shift change pt's hr noted to be in the 160s. Upon arrival to the room pt appeared to be going into respiratory distress. Pt was diaphoretic, respirations 28, cbg 151, bp 201/107. Rapid Response RN was called. Pt had rales in the bases & expiratory wheezing.  MD was notified & respiratory was called. Pt was given xopenex along with atrovent. Pt was given 20 mg of IV labatelol for her HR. CXR, abg, & transfer orders were placed along with a CT when the pt is stable. Report was called to RN on 2100. Pt's husband Marcello Moores was notified of pt being transferred to ICU. Hoover Brunette

## 2014-03-24 NOTE — Progress Notes (Signed)
MD on call notified around 2000 of the pt having an increase in SOB as well as pt's hr being up to the 120s. Pt given 20 mg of IV labetalol for a bp of 180/91. Pt also had a temp of 100.6. New orders for an ABG were given. Will continue to monitor the pt. Hoover Brunette

## 2014-03-24 NOTE — Progress Notes (Signed)
RN concerned about scheduled medications.   Oral meds changed to per tube.  Hold cymbalta as can not crush.     Noe Gens, NP-C [facility] Pgr: [phone] or [phone]

## 2014-03-24 NOTE — Consult Note (Signed)
PULMONARY / CRITICAL CARE MEDICINE   Name: Colleen [name] MRN: [id number] DOB: 1951-09-19    ADMISSION DATE:  03/18/2014 CONSULTATION DATE:  03/24/2014  REFERRING MD :  Leonie Man PRIMARY SERVICE: PCCM  CHIEF COMPLAINT:  Respiratory Distress following hemorrhagic conversion CVA  BRIEF PATIENT DESCRIPTION: 63 y.o. 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  Transferred to MICU for respiratory distress 3/26.  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 3/26 - transferred to MICU for respiratory distress  LINES / TUBES: PIV  CULTURES: Blood 2/26 >>> Urine 2/26 >>> Sputum 2/26 >>>  ANTIBIOTICS: Vanc 2/26 >>> Zosyn 2/26 >>>   HISTORY OF PRESENT ILLNESS:  Colleen [name] is a 63 y.o. 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  She was discharged home and coumadin which she was taking for atrial fibrillation was stopped.  On 03/18/2014 she developed severe headache with nausea and vomiting and was brought to the [facility] where she had left-sided weakness.  Workup revealed right temporal-occipital region hemorrhagic conversion with intraventricular extension into the right lateral ventricle. She was not a TPA candidate secondary to stroke with in the past 90 days and hemorrhage.  Neurosurgeon consulted (Dr. Ronnald Ramp) felt no surgical intervention was necessary. She was admitted to the neuro ICU for further treatment and evaluation. On 3/26, RRT called for respiratory distress.  On exam, pt was tachycardic to 140-160, BP 201/107, RR 26, diaphoretic and restless.   Breathing Rx given and PCXR ordered.  Pt was transferred to MICU for further eval and treatment.   PAST MEDICAL HISTORY :  Past Medical  History  Diagnosis Date  . Hypertension   . Hiatal hernia   . Adenomatous polyp of colon   . Acute asthmatic bronchitis   . GERD (gastroesophageal reflux disease)     EGD neg 03/07/2008, but prev required dilation  . COPD (chronic obstructive pulmonary disease)   . IBS (irritable bowel syndrome)     chronic  . Urinary incontinence   . Esophagitis   . Chronic gastritis   . Duodenitis without hemorrhage 10/01/2001  . Diverticulosis   . Personal history of colonic adenomas 02/05/2008        . Anemia   . Hyperlipidemia   . Decreased peripheral vision of right eye   . Pneumonia 2011  . Hyperthyroidism   . Type II diabetes mellitus   . Stroke     a. Hx of stroke and ministrokes ~ 2009. b. TIA 11/2013, dx with AF at that time.  . Arthritis   . Depression   . On home oxygen therapy     "2L; suppose to use 24/7" (01/05/2014)  . PAF (paroxysmal atrial fibrillation)     a. Prior hx. b. Recurred 11/2013 in setting of possible TIA. Lifelong Coumadin planned. Spont converted to NSR. EF 60-65% by echo 11/2013, normal cors 2007, normal nuc 2011.   . Atrial flutter     a. Dx 12/2013. s/p DCCV. On anticoagulation.   Past Surgical History  Procedure Laterality Date  . Colonoscopy    . Upper gi endoscopy    . Cholecystectomy  1980's  . Vaginal hysterectomy  1992  . Breast biopsy Left 1987    "milk gland"  . Cardioversion  N/A 01/06/2014    Procedure: CARDIOVERSION;  Surgeon: Lelon Perla, MD;  Location: [facility] OR;  Service: Cardiovascular;  Laterality: N/A;   Prior to Admission medications   Medication Sig Start Date End Date Taking? Authorizing Provider  ALPRAZolam Duanne Moron) 0.5 MG tablet Take 0.5 mg by mouth at bedtime.    Yes Historical Provider, MD  atorvastatin (LIPITOR) 20 MG tablet Take 1 tablet (20 mg total) by mouth every evening. 01/06/14  Yes Dayna N Dunn, PA-C  cetirizine (ZYRTEC) 10 MG tablet Take 10 mg by mouth at bedtime.    Yes Historical Provider, MD  digoxin (LANOXIN) 0.25 MG tablet  Take 0.5 tablets (0.125 mg total) by mouth daily. 03/03/14  Yes Lelon Perla, MD  diltiazem (CARDIZEM CD) 360 MG 24 hr capsule Take 1 capsule (360 mg total) by mouth daily. 02/19/14  Yes Cherene Altes, MD  DULoxetine (CYMBALTA) 60 MG capsule Take 120 mg by mouth daily.   Yes Historical Provider, MD  ferrous sulfate 325 (65 FE) MG tablet Take 325 mg by mouth 2 (two) times daily with a meal.   Yes Historical Provider, MD  Fluticasone-Salmeterol (ADVAIR DISKUS) 250-50 MCG/DOSE AEPB Inhale 1 puff into the lungs 2 (two) times daily.    Yes Historical Provider, MD  furosemide (LASIX) 40 MG tablet Take 40 mg by mouth daily.    Yes Historical Provider, MD  HYDROcodone-acetaminophen (NORCO) 7.5-325 MG per tablet Take 1 tablet by mouth every 6 (six) hours as needed for moderate pain.   Yes Historical Provider, MD  levalbuterol [facility] HFA) 45 MCG/ACT inhaler Inhale 2 puffs into the lungs every 4 (four) hours as needed for wheezing or shortness of breath.   Yes Historical Provider, MD  levalbuterol Penne Lash) 0.63 MG/3ML nebulizer solution Take 3 mLs (0.63 mg total) by nebulization every 3 (three) hours as needed for wheezing or shortness of breath. 02/19/14  Yes Cherene Altes, MD  metFORMIN (GLUMETZA) 1000 MG (MOD) 24 hr tablet Take 1,000 mg by mouth 2 (two) times daily with a meal.   Yes Historical Provider, MD  methimazole (TAPAZOLE) 5 MG tablet Take 5 mg by mouth daily.   Yes Historical Provider, MD  metoprolol tartrate (LOPRESSOR) 25 MG tablet Take 3 tablets (75 mg total) by mouth 2 (two) times daily. 02/19/14  Yes Cherene Altes, MD  omeprazole (PRILOSEC) 40 MG capsule Take 40 mg by mouth 2 (two) times daily.    Yes Historical Provider, MD  solifenacin (VESICARE) 10 MG tablet Take 10 mg by mouth daily.   Yes Historical Provider, MD  tiotropium (SPIRIVA) 18 MCG inhalation capsule Place 18 mcg into inhaler and inhale daily.    Yes Historical Provider, MD  trimethoprim (TRIMPEX) 100 MG tablet Take  100 mg by mouth at bedtime.   Yes Historical Provider, MD   Allergies  Allergen Reactions  . Crestor [Rosuvastatin]   . Latex Hives  . Wellbutrin [Bupropion]     FAMILY HISTORY:  Family History  Problem Relation Age of Onset  . Heart disease Mother   . Asthma Father   . Lung cancer Father   . Asthma Sister   . Heart disease Sister   . Colon cancer Father    SOCIAL HISTORY:  reports that she has been smoking Cigarettes.  She has a 51 pack-year smoking history. She has never used smokeless tobacco. She reports that she does not drink alcohol or use illicit drugs.  REVIEW OF SYSTEMS: Review of Systems  Constitutional: Positive for  diaphoresis. Negative for fever, chills, weight loss and malaise/fatigue.  HENT: Positive for congestion.   Eyes: Negative.   Respiratory: Positive for sputum production, shortness of breath and wheezing. Negative for cough and hemoptysis.   Cardiovascular: Positive for palpitations. Negative for orthopnea, claudication, leg swelling and PND.  Gastrointestinal: Negative.   Genitourinary: Negative.   Musculoskeletal: Positive for falls.  Skin: Negative for itching and rash.  Neurological: Positive for weakness and headaches.  Endo/Heme/Allergies: Negative.   Psychiatric/Behavioral: Negative.     SUBJECTIVE:  Very restless in bed, moving legs constantly.  Has movement in all 4 extremities.  Hypertensive, tachypneic, tachycardic.  VITAL SIGNS: Temp:  [97.4 F (36.3 C)-100.6 F (38.1 C)] 98.3 F (36.8 C) (03/26 0732) Pulse Rate:  [84-140] 102 (03/26 0915) Resp:  [17-39] 26 (03/26 0915) BP: (141-201)/(76-107) 141/86 mmHg (03/26 0915) SpO2:  [93 %-100 %] 93 % (03/26 0915) Weight:  [151 lb 3.8 oz (68.6 kg)] 151 lb 3.8 oz (68.6 kg) (03/26 0500) HEMODYNAMICS:   VENTILATOR SETTINGS:   INTAKE / OUTPUT: Intake/Output     03/25 0701 - 03/26 0700 03/26 0701 - 03/27 0700   I.V. (mL/kg) 118.3 (1.7) 20 (0.3)   NG/GT 355    IV Piggyback     Total  Intake(mL/kg) 473.3 (6.9) 20 (0.3)   Urine (mL/kg/hr) 130 (0.1)    Total Output 130     Net +343.3 +20        Urine Occurrence 2 x 1 x   Stool Occurrence 2 x      PHYSICAL EXAMINATION: General: Ill appearing female, restless in bed but in NAD. Neuro: LUE weakness but is waving it around.  Unable to assess mental status as does not answer questions.  Is mumbling random words. HEENT: [place]/AT. Pupils dilated but equal and responsive., sclerae anicteric. Cardiovascular: RRR, no M/R/G.  Lungs: Respirations even and unlabored.  CTA bilaterally, No W/R/R. Abdomen: BS x 4, soft, NT/ND.  Musculoskeletal: No gross deformities, no edema.  Skin: Intact, warm, no rashes.   LABS:  CBC  Recent Labs Lab 03/18/14 1057 03/18/14 1109 03/20/14 1104  WBC 22.1*  --  11.3*  HGB 15.0 18.4* 11.6*  HCT 48.3* 54.0* 39.1  PLT 381  --  228   Coag's  Recent Labs Lab 03/18/14 1057  03/19/14 1156 03/20/14 0248 03/21/14 0215  APTT 48*  --   --   --   --   INR 2.61*  < > 1.02 1.08 1.04  < > = values in this interval not displayed. BMET  Recent Labs Lab 03/18/14 1057 03/18/14 1109 03/20/14 1104  NA 133* 133* 135*  K 4.3 4.1 4.2  CL 87* 94* 99  CO2 24  --  21  BUN 13 15 18   CREATININE 0.49* 0.60 0.55  GLUCOSE 207* 200* 111*   Electrolytes  Recent Labs Lab 03/18/14 1057 03/20/14 1104  CALCIUM 10.0 9.0   Sepsis Markers No results found for this basename: LATICACIDVEN, PROCALCITON, O2SATVEN,  in the last 168 hours ABG  Recent Labs Lab 03/23/14 2001 03/24/14 0810  PHART 7.488* 7.479*  PCO2ART 43.1 42.7  PO2ART 57.3* 77.8*   Liver Enzymes  Recent Labs Lab 03/18/14 1057  AST 36  ALT 13  ALKPHOS 156*  BILITOT 0.8  ALBUMIN 4.0   Cardiac Enzymes No results found for this basename: TROPONINI, PROBNP,  in the last 168 hours Glucose  Recent Labs Lab 03/23/14 1151 03/23/14 1708 03/23/14 2002 03/24/14 0400 03/24/14 0731 03/24/14 0903  GLUCAP 100*  111* 139* 104* 151*  141*    Imaging Dg Chest Port 1 View  03/24/2014   CLINICAL DATA:  SOB/ wheeze  EXAM: PORTABLE CHEST - 1 VIEW  COMPARISON:  DG CHEST 1V PORT dated 03/18/2014  FINDINGS: The cardiac silhouette is mild-to-moderately enlarged. Low lung volumes. Diffuse increased density projects within the right lung base. Increased density projects in the left retrocardiac region. Atherosclerotic calcifications projecting in the aortic arch. Osseous structures unremarkable. Feeding tube identified with tip in the fundal region of the stomach.  IMPRESSION: Basilar infiltrates versus atelectasis. Further evaluation with PA and lateral chest radiograph recommended all.  Feeding tube appreciated tip projecting in the fundal region of the stomach.   Electronically Signed   By: Margaree Mackintosh M.D.   On: 03/24/2014 08:11   Dg Swallowing Func-speech Pathology  03/23/2014   Orbie Pyo Haskins, CCC-SLP     03/23/2014  3:07 PM Objective Swallowing Evaluation: Modified Barium Swallowing Study   Patient Details  Name: NOUR RODRIGUES MRN: [id number] Date of Birth: [birth year]  Today's Date: 03/23/2014 Time: 7341-9379 SLP Time Calculation (min): 20 min  Past Medical History:  Past Medical History  Diagnosis Date  . Hypertension   . Hiatal hernia   . Adenomatous polyp of colon   . Acute asthmatic bronchitis   . GERD (gastroesophageal reflux disease)     EGD neg 03/07/2008, but prev required dilation  . COPD (chronic obstructive pulmonary disease)   . IBS (irritable bowel syndrome)     chronic  . Urinary incontinence   . Esophagitis   . Chronic gastritis   . Duodenitis without hemorrhage 10/01/2001  . Diverticulosis   . Personal history of colonic adenomas 02/05/2008        . Anemia   . Hyperlipidemia   . Decreased peripheral vision of right eye   . Pneumonia 2011  . Hyperthyroidism   . Type II diabetes mellitus   . Stroke     a. Hx of stroke and ministrokes ~ 2009. b. TIA 11/2013, dx with  AF at that time.  . Arthritis   . Depression   . On home oxygen  therapy     "2L; suppose to use 24/7" (01/05/2014)  . PAF (paroxysmal atrial fibrillation)     a. Prior hx. b. Recurred 11/2013 in setting of possible TIA.  Lifelong Coumadin planned. Spont converted to NSR. EF 60-65% by  echo 11/2013, normal cors 2007, normal nuc 2011.   . Atrial flutter     a. Dx 12/2013. s/p DCCV. On anticoagulation.   Past Surgical History:  Past Surgical History  Procedure Laterality Date  . Colonoscopy    . Upper gi endoscopy    . Cholecystectomy  1980's  . Vaginal hysterectomy  1992  . Breast biopsy Left 1987    "milk gland"  . Cardioversion N/A 01/06/2014    Procedure: CARDIOVERSION;  Surgeon: Lelon Perla, MD;   Location: [place];  Service: Cardiovascular;  Laterality: N/A;   HPI:  63 y.o. female with a history of recent stroke on Wednesday who  was discharged from [facility] after having coumadin stopped  for some pethechial hemorrhage associated with the hemorrhage.   PMH:  HTN, hiatal hernia, GERD, COPD, esophagitis, chronic  gastritis, pna, CVA 2009.  CXR 3/20 no edema or consolidation.    02/14/14 right lower lobe infiltrate and effusion.  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 Intra-axial  hematoma with estimated volume of 53 mm, plus secondary extension  of hemorrhage into the right lateral ventricle.  Repeat CT 3/21  No new intracranial hemorrhage or infarct identified.   No edema  or consolidation.      Assessment / Plan / Recommendation Clinical Impression  Dysphagia Diagnosis: Moderate pharyngeal phase dysphagia;Mild  oral phase dysphagia;Moderate oral phase dysphagia Clinical impression: Pt. demonstrated mild-moderate oral and  moderate sensorimotor phayrngeal dysphagia (sensory > motor).   Swallows of thin barium initated at pyriform sinuses resulting in  aspiration with strong reflexive cough.  Cognitive deficits  prohibits use of compensatory techniques at present.  Reduced  tongue base  retraction and laryngeal elevation led to minimal  intermittent pharyngeal residue. Mildly prolonged oral prep and  transit.  SLP recommends Dys 1 diet texture and nectar thick  liquids, no straws, pills crushed and full supervion for  cognitive impairments including impulsivity.  ST will follow.      Treatment Recommendation  Therapy as outlined in treatment plan below    Diet Recommendation Dysphagia 1 (Puree);Nectar-thick liquid   Liquid Administration via: Cup;No straw Medication Administration: Crushed with puree Supervision: Patient able to self feed;Full supervision/cueing  for compensatory strategies Compensations: Slow rate;Small sips/bites;Check for pocketing Postural Changes and/or Swallow Maneuvers: Seated upright 90  degrees;Upright 30-60 min after meal    Other  Recommendations Oral Care Recommendations: Oral care BID   Follow Up Recommendations  Inpatient Rehab    Frequency and Duration min 2x/week  2 weeks   Pertinent Vitals/Pain WDL                     Orbie Pyo Litaker M.Ed CCC-SLP Pager [phone]  03/23/2014      CXR: Bibasilar infiltrates/atx, R>L.  ASSESSMENT / PLAN:  PULMONARY A: Tachypnea - Concern in the setting of possible early HCAP/aspiration PNA Protecting airway though low threshold for intubation if respiratory status declines. P:   - Supplemental O2 to maintain SpO2 > 93. - Blood, Sputum, Urine cultures. - Vanc/Zosyn, narrow as cultures result. - PCT algorithm. - BD's PRN.  CARDIOVASCULAR A:  Afib RVR Hypertension HLD P:  - Strict BP control, goal BP < 160/100. - Continue Diltiazem, Lopressor, Digoxin. - Cardene gtt & Labetalol PRN.  RENAL A:  No acute issues. P:   - Continue Lasix. - BMP in AM.  GASTROINTESTINAL A:  NPO SUP Prophylaxis P:   - Keep NPO for now given AMS and concern for possible aspiration. - Continue Famotidine. - Can start TF's in AM if remains NPO status.  HEMATOLOGIC A:   VTE Prophylaxis P:  - SCD's, hold anticoags  given ICH.  INFECTIOUS A:  Concern for possible early PNA Leukocytosis P:   - Pan culture as above. - Vanc/Zosyn, narrow as cultures result. - PCT algorithm. - Monitor WBC's/fever curve.  ENDOCRINE A:   ?Thyroid disorder DM P:   - Continue methimazole. - SSI.  NEUROLOGIC A:   CVA with hemorrhagic conversion AMS P:   - Manager per neuro.   Montey Hora, PA - C [place] [facility] Pgr: [phone]  or [phone]    I have personally obtained a history, examined the patient, evaluated laboratory and imaging results, formulated the assessment and plan and placed orders.   Merton Border, MD ; [facility] [phone].  After 5:30 PM or weekends, call [phone]  Pulmonary and [place] Pager: [phone]  03/24/2014, 11:21 AM

## 2014-03-24 NOTE — Progress Notes (Signed)
Stroke Team Progress Note  HISTORY 63 y.o. female with a history of recent right occipital infarct on Wednesday 03/16/2014 who was seen at [facility] following a fall where a CT showed pethechial hemorrhage, she was d/c home after stopping her coumadin which she takes for atrial fibrillation. On 03/18/2014 she developed severe headache with nausea and vomiting and was brought to the ED where she had left-sided weakness and was found to have right temporal-occipital region with intraventricular extension into the right lateral ventricle. She was not a TPA candidate secondary to stroke with in the past 90 days and hemorrhage. Neurosurgeon consulted (Dr. Ronnald Ramp) felt no surgical intervention was necessary. She was admitted to the neuro ICU for further treatment and evaluation.  SUBJECTIVE Family at bedside. Has sudden onset dyspnea this am. Transferred to the ICU for management.  OBJECTIVE Most recent Vital Signs: Filed Vitals:   03/24/14 0732 03/24/14 0752 03/24/14 0900 03/24/14 0915  BP: 201/107  175/98 141/86  Pulse:   87 102  Temp: 98.3 F (36.8 C)     TempSrc:      Resp: 28  39 26  Height:      Weight:      SpO2: 93% 97% 93% 93%   CBG (last 3)   Recent Labs  03/24/14 0400 03/24/14 0731 03/24/14 0903  GLUCAP 104* 151* 141*    IV Fluid Intake:   . 0.9 % NaCl with KCl 20 mEq / L 20 mL/hr at 03/24/14 0900  . labetalol (NORMODYNE) infusion Stopped (03/21/14 1103)    MEDICATIONS  . antiseptic oral rinse  15 mL Mouth Rinse q12n4p  . atorvastatin  20 mg Oral QPM  . chlorhexidine  15 mL Mouth Rinse BID  . digoxin  0.125 mg Oral Daily  . diltiazem  30 mg Per Tube 4 times per day  . DULoxetine  120 mg Oral Daily  . famotidine (PEPCID) IV  20 mg Intravenous Q12H  . feeding supplement (VITAL AF 1.2 CAL)  1,000 mL Per Tube Q24H  . furosemide  40 mg Oral Daily  . insulin aspart  2-6 Units Subcutaneous 6 times per day  . methimazole  5 mg Oral Daily  . metoprolol tartrate  75 mg  Oral BID  . mometasone-formoterol  2 puff Inhalation BID  . tiotropium  18 mcg Inhalation Daily  . valproate sodium  500 mg Intravenous 3 times per day   PRN:  acetaminophen, acetaminophen, food thickener, ipratropium, labetalol, levalbuterol  Diet:  Dysphagia 1 nectar thick Activity:  Up with assistance DVT Prophylaxis:  SCDs  CLINICALLY SIGNIFICANT STUDIES Basic Metabolic Panel:   Recent Labs Lab 03/18/14 1057 03/18/14 1109 03/20/14 1104  NA 133* 133* 135*  K 4.3 4.1 4.2  CL 87* 94* 99  CO2 24  --  21  GLUCOSE 207* 200* 111*  BUN 13 15 18   CREATININE 0.49* 0.60 0.55  CALCIUM 10.0  --  9.0   Liver Function Tests:   Recent Labs Lab 03/18/14 1057  AST 36  ALT 13  ALKPHOS 156*  BILITOT 0.8  PROT 8.9*  ALBUMIN 4.0   CBC:   Recent Labs Lab 03/18/14 1057 03/18/14 1109 03/20/14 1104  WBC 22.1*  --  11.3*  NEUTROABS 19.9*  --   --   HGB 15.0 18.4* 11.6*  HCT 48.3* 54.0* 39.1  MCV 71.8*  --  75.2*  PLT 381  --  228   Coagulation:   Recent Labs Lab 03/19/14 0550 03/19/14 1156 03/20/14 0248  03/21/14 0215  LABPROT 13.3 13.2 13.8 13.4  INR 1.03 1.02 1.08 1.04   Cardiac Enzymes: No results found for this basename: CKTOTAL, CKMB, CKMBINDEX, TROPONINI,  in the last 168 hours Urinalysis:   Recent Labs Lab 03/19/14 0620  COLORURINE AMBER*  LABSPEC 1.018  PHURINE 6.5  GLUCOSEU 100*  HGBUR NEGATIVE  BILIRUBINUR SMALL*  KETONESUR 15*  PROTEINUR >300*  UROBILINOGEN 1.0  NITRITE NEGATIVE  LEUKOCYTESUR NEGATIVE   Lipid Panel    Component Value Date/Time   CHOL 159 03/19/2014 0550   TRIG 134 03/19/2014 0550   HDL 58 03/19/2014 0550   CHOLHDL 2.7 03/19/2014 0550   VLDL 27 03/19/2014 0550   LDLCALC 74 03/19/2014 0550   HgbA1C  Lab Results  Component Value Date   HGBA1C 6.1* 03/19/2014    Urine Drug Screen:     Component Value Date/Time   LABOPIA POSITIVE* 12/07/2013 2131   COCAINSCRNUR NONE DETECTED 12/07/2013 2131   LABBENZ NONE DETECTED 12/07/2013  2131   AMPHETMU NONE DETECTED 12/07/2013 2131   THCU NONE DETECTED 12/07/2013 2131   LABBARB NONE DETECTED 12/07/2013 2131    Alcohol Level: No results found for this basename: ETH,  in the last 168 hours   CT of the brain   03/22/2014 Similar appearance of right temporal parietal 6.5 x 3.4 cm intraparenchymal hemorrhage with intraventricular extension, similar right left midline shift. No rebleed. No hydrocephalus.  03/20/2014    Stable parenchymal hematoma in the right temporo-occipital region, as described above.  Mild surrounding vasogenic edema, extension into the left lateral ventricle, and 4 mm leftward midline shift, grossly unchanged.  Stable cortical effacement/loss of sulcation involving the right cerebral hemisphere, indicating cerebral edema.    03/19/2014    1. No significant interval change in intra-axial hematoma involving the right temporal-occipital region with intraventricular extension into the right lateral ventricle. The intraparenchymal hematoma measures 6.4 x 3.7 cm on today's exam, previously 6.4 x 3.7 cm on 03/18/2014. 2. Similar appearance of diffuse edema involving the right cerebral hemisphere. Similar leftward midline shift of 5 mm. 3. Similar enlargement of the temporal horns of the lateral ventricles bilaterally. No hydrocephalus. Basilar cisterns remain patent. 4. No new intracranial hemorrhage or infarct identified.    03/18/2014   1. 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 Intra-axial hematoma with estimated volume of 53 mm, plus secondary extension of hemorrhage into the right lateral ventricle. 2. Generalized right hemisphere edema. Leftward midline shift of 5 mm. 3. Mild enlargement of the temporal horns.   2D Echocardiogram  Left ventricle: The cavity size was normal. Wall thickness was normal. Systolic function was normal. The estimated ejection fraction was in the range of 55% to 60%. Wall motion  was normal; there were no regional wall motion abnormalities.  Carotid Doppler  03/18/2014 1-39% internal carotid artery stenosis bilaterally. Vertebral arteries are patent with antegrade flow.  CXR   03/24/2014 Basilar infiltrates versus atelectasis. Further evaluation with PA  and lateral chest radiograph recommended all. Feeding tube appreciated tip projecting in the fundal region of the stomach.  03/18/2014   No edema or consolidation.     EEG 03/22/2014 This EEG is most consistent with a generalized nonspecific cerebral dysfunction. This EEG represents a marked improvement compared to the EEG of yesterday. There was no seizure recorded on this study.  03/21/2014 This recorded 2 likely epileptic seizures addition to a moderate to severe generalized nonspecific cerebral dysfunction. There was no seizure  or seizure predisposition recorded on this study.   EKG  A-fib.   Therapy Recommendations CIR  Physical Exam  General: in bed  Resp: non-laboured breathing  CV: Irregular  Abd: NT, ND  Ext: no edema  Mental Status:  Patient is drowsy and can easily be aroused and follows  Commands well   but does have mild  left neglect  Cranial Nerves:  II: L hemianopia. Pupils are equal, round, and reactive to light. Discs are difficult to visualize.  III,IV, VI: EOMI without ptosis or diploplia.  V: Facial sensation is symmetric to temperature  VII: Facial movement is decreased on left  VIII: hearing is intact to voice  X: Uvula elevates symmetrically  XI: Shoulder shrug is symmetric.  XII: tongue is midline without atrophy or fasciculations.  Motor:  Tone is normal. Bulk is normal. 5/5 strength was present on the right, significant weakness on the left.  Sensory:  Sensation is decreased on the left,    Deep Tendon Reflexes:  2+ and symmetric in the biceps and patellae.  Cerebellar:  FNF and HKS are intact on right  Gait:  Not assessed    ASSESSMENT Colleen [name] is a 63 y.o. female  with hemorrhagic conversion of an ischemic infarct that occurred last week. Imaging confirms a right temporo-occipital region with intraventricular extension and cerebral edema with leftward midline shift. She had been on warfarin (now on hold), INR on arrival is 2.61. It was reversed with anticoagulation reversal protocol down to 1.04. Previous infarct felt to be embolic secondary to atrial fibrillation. Had been on coumadin in the past, now on hold d/t hemorrhage. Patient with resultant lethargy that seemed out of proportion to size of hemorrhage - EEG revealed non-convulsive status improved on Depacon, level 70.5 acceptable. Patient with resulting dysarthria, dysphagia and left hemiparesis. No change in neuro status this am.  RN noted labored respirations during the night 3/25 - increased rhonchi, wheezing and crackles, unproductive cough, given lasix. This was found to be stable during rounds by Dr. Tilden Dome. Again with respiratory distress during the night 3/26. Seen by Aram Beecham;  transferred back to the ICU. Dr. Leonie Man discussed with Dr. Alva Garnet who feels pt with developing pneumonia. Not in respiratory distress currently.  atrial fibrillation - recurred in setting of TIA, placed on coumadin with plans for lifelong treatment Hypertension Hyperlipidemia, LDL 74, on lipitor 20 mg daily PTA, now on lipitor 20 md daily, goal LDL < 70 for diabetics Diabetes Type 2, HgbA1c 6.1, goal < 7.0 Decreased UOP, started on IVF, improved Hyponatremia, 135 Hx stroke - 2009 stroke and TIA, 11/2013 TIA w/ afib tachycardia  Neuroconsult Ronnald Ramp) seen and signed off.  Hospital day # 6  TREATMENT/PLAN  Watch in ICU for now  CCM to manage respiratory status  No indication for CT or other neuro test. Neuro stable.  Disposition: IP Rehab, they are following  Burnetta Sabin, MSN, RN, ANVP-BC, ANP-BC, GNP-BC [facility] Pager: [phone] 03/24/2014 11:13 AM  I have personally obtained a history,  examined the patient, evaluated imaging results, and formulated the assessment and plan of care. I agree with the above. Antony Contras, MD  To contact Stroke Continuity provider, please refer to [email or web address] After hours, contact General Neurology

## 2014-03-24 NOTE — Progress Notes (Signed)
Noted medical issues this morning. I will follow. [phone]

## 2014-03-24 NOTE — Significant Event (Signed)
Rapid Response Event Note  Overview:  Called for respiratory distress Time Called: 0728 Arrival Time: 0730 Event Type: Respiratory;Cardiac  Initial Focused Assessment:  Upon arrival to patients room, Rn at bedside.  As per Rn, suddenly developed Respiratory distress, became diaphoretic, HR afib 140-160's, BP 201/107.  Patient very restless, diaphoretic, RR 26, Spo2 95% on nasal cannula 3 lpm.  BR sounds Rales at bases and expiratory wheezes.     Interventions:  AS per protocol, PCXR ordered and breathing treatment given with xopenex and atrovent. Dr. Aram Beecham paged and updated.   20 mg IV labatelol given as per MD order.  Now at bedside.  ABG ordered, orders to transfer to ICU.  CT scan when stable.  Patient still with SOB, RR 24-26, BP 143/91, HR 120-130's, Spo2 98%.     Event Summary:   at      at          [facility]

## 2014-03-25 ENCOUNTER — Inpatient Hospital Stay (HOSPITAL_COMMUNITY): Payer: Medicaid Other

## 2014-03-25 DIAGNOSIS — I359 Nonrheumatic aortic valve disorder, unspecified: Secondary | ICD-10-CM

## 2014-03-25 LAB — BASIC METABOLIC PANEL
BUN: 16 mg/dL (ref 6–23)
CO2: 31 mEq/L (ref 19–32)
Calcium: 9.5 mg/dL (ref 8.4–10.5)
Chloride: 93 mEq/L — ABNORMAL LOW (ref 96–112)
Creatinine, Ser: 0.51 mg/dL (ref 0.50–1.10)
Glucose, Bld: 114 mg/dL — ABNORMAL HIGH (ref 70–99)
POTASSIUM: 3.3 meq/L — AB (ref 3.7–5.3)
SODIUM: 140 meq/L (ref 137–147)

## 2014-03-25 LAB — GLUCOSE, CAPILLARY
GLUCOSE-CAPILLARY: 112 mg/dL — AB (ref 70–99)
GLUCOSE-CAPILLARY: 114 mg/dL — AB (ref 70–99)
Glucose-Capillary: 114 mg/dL — ABNORMAL HIGH (ref 70–99)
Glucose-Capillary: 106 mg/dL — ABNORMAL HIGH (ref 70–99)
Glucose-Capillary: 94 mg/dL (ref 70–99)
Glucose-Capillary: 111 mg/dL — ABNORMAL HIGH (ref 70–99)
Glucose-Capillary: 125 mg/dL — ABNORMAL HIGH (ref 70–99)

## 2014-03-25 LAB — PROCALCITONIN

## 2014-03-25 MED ORDER — ACETAMINOPHEN 325 MG PO TABS
650.0000 mg | ORAL_TABLET | ORAL | Status: DC | PRN
  Administered 2014-03-26 – 2014-03-27 (×2): 650 mg
  Filled 2014-03-25 (×2): qty 2

## 2014-03-25 MED ORDER — METOPROLOL TARTRATE 50 MG PO TABS
50.0000 mg | ORAL_TABLET | Freq: Three times a day (TID) | ORAL | Status: DC
  Administered 2014-03-25 – 2014-03-28 (×9): 50 mg
  Filled 2014-03-25 (×12): qty 1

## 2014-03-25 MED ORDER — BUDESONIDE 0.25 MG/2ML IN SUSP
0.2500 mg | Freq: Four times a day (QID) | RESPIRATORY_TRACT | Status: DC
Start: 2014-03-25 — End: 2014-03-28
  Administered 2014-03-25 – 2014-03-28 (×14): 0.25 mg via RESPIRATORY_TRACT
  Filled 2014-03-25 (×17): qty 2

## 2014-03-25 MED ORDER — PANTOPRAZOLE SODIUM 40 MG PO PACK
40.0000 mg | PACK | Freq: Every day | ORAL | Status: DC
  Administered 2014-03-25 – 2014-03-28 (×4): 40 mg
  Filled 2014-03-25 (×4): qty 20

## 2014-03-25 MED ORDER — ALBUTEROL SULFATE (2.5 MG/3ML) 0.083% IN NEBU
2.5000 mg | INHALATION_SOLUTION | RESPIRATORY_TRACT | Status: DC | PRN
  Administered 2014-03-25: 2.5 mg via RESPIRATORY_TRACT
  Filled 2014-03-25: qty 3

## 2014-03-25 MED ORDER — INSULIN ASPART 100 UNIT/ML ~~LOC~~ SOLN
0.0000 [IU] | SUBCUTANEOUS | Status: DC
  Administered 2014-03-25 – 2014-03-26 (×5): 1 [IU] via SUBCUTANEOUS
  Administered 2014-03-26: 2 [IU] via SUBCUTANEOUS
  Administered 2014-03-26 – 2014-03-27 (×7): 1 [IU] via SUBCUTANEOUS

## 2014-03-25 MED ORDER — ALBUTEROL SULFATE (2.5 MG/3ML) 0.083% IN NEBU
2.5000 mg | INHALATION_SOLUTION | Freq: Four times a day (QID) | RESPIRATORY_TRACT | Status: DC
  Administered 2014-03-25 – 2014-03-28 (×13): 2.5 mg via RESPIRATORY_TRACT
  Filled 2014-03-25 (×14): qty 3

## 2014-03-25 MED ORDER — BUDESONIDE 0.25 MG/2ML IN SUSP
0.2500 mg | Freq: Four times a day (QID) | RESPIRATORY_TRACT | Status: DC
  Filled 2014-03-25 (×4): qty 2

## 2014-03-25 MED ORDER — ACETAMINOPHEN 650 MG RE SUPP: 650.0000 mg | RECTAL | Status: DC | PRN

## 2014-03-25 MED ORDER — POTASSIUM CHLORIDE 20 MEQ/15ML (10%) PO LIQD
40.0000 meq | Freq: Once | ORAL | Status: AC
  Administered 2014-03-25: 40 meq
  Filled 2014-03-25: qty 30

## 2014-03-25 NOTE — Progress Notes (Signed)
Called and spoke with daughter Sherlynn Carbon and informed her of new room number so she can inform rest of the family, Hazle Nordmann RN

## 2014-03-25 NOTE — Progress Notes (Signed)
SLP Cancellation Note  Patient Details Name: KARRISA DIDIO MRN: [id number] DOB: 10-11-1951   Cancelled treatment:       Reason Eval/Treat Not Completed: Fatigue/lethargy limiting ability to participate.  Pt now NPO with NG.     Juan Quam Laurice 03/25/2014, 2:49 PM

## 2014-03-25 NOTE — Progress Notes (Signed)
Physical Therapy Treatment Patient Details Name: Colleen [name] MRN: [id number] DOB: 1951-08-06 Today's Date: 03/25/2014    History of Present Illness TAALIYAH DELPRIORE is a 63 y.o. female with a history of recent stroke on Wednesday who was discharged from [facility] after having coumadin stopped for some pethechial hemorrhage associated with the hemorrhage. Pt with R temporo-occipital hemorrhage    PT Comments    Pt was somewhat difficult to arouse, but once she was aroused her eyes were open 90% of time.  Gaze preference still to R with inability today to track past midline toward Left.  Worked on sitting balance at EOB for 6-7 min to sit upright,  Balance, work on tracking L, weight bearing through both arms.  Stood at [employer] for >4 min working on balance w/shift with more truncal support than LE support.  Pt acknowledged therapist on several occasions from the right field and vocalized on 2 separate occasions.    Follow Up Recommendations  CIR     Equipment Recommendations   (TBA)    Recommendations for Other Services Rehab consult     Precautions / Restrictions Precautions Precautions: Fall Precaution Comments: inattension to L continues    Mobility  Bed Mobility Overal bed mobility: Needs Assistance Bed Mobility: Rolling;Sidelying to Sit;Sit to Supine Rolling: Total assist;+2 for physical assistance Sidelying to sit: Total assist;+2 for physical assistance   Sit to supine: Max assist;+2 for physical assistance   General bed mobility comments: tactile cues needed in addition to verbal to get any initiation  Transfers Overall transfer level: Needs assistance Equipment used: 2 person hand held assist Transfers: Sit to/from Stand Sit to Stand: Mod assist;+2 physical assistance;Max assist (Max initially then mod)         General transfer comment: Required truncal assist for stability, but no knee or LE blocking or support needed.  Pt stood for 4-5 min total while working  on w/shift and balance.  Had some difficulty getting pt to sit  Ambulation/Gait                 Stairs            Wheelchair Mobility    Modified Rankin (Stroke Patients Only) Modified Rankin (Stroke Patients Only) Pre-Morbid Rankin Score: No symptoms Modified Rankin: Severe disability     Balance Overall balance assessment: Needs assistance Sitting-balance support: Single extremity supported;Feet supported Sitting balance-Leahy Scale: Zero Sitting balance - Comments: For brief periods pt could maintain balance with certain positions, but generally need maximal assist Postural control: Posterior lean Standing balance support: Bilateral upper extremity supported Standing balance-Leahy Scale: Zero Standing balance comment: Again at times pt able to balance without significant assist, but generally listed or thrusted posteriorly to stand upright.  For over 4 min pt stood erect with knees extended but not locked into hyperextension.                    Cognition Arousal/Alertness: Lethargic Behavior During Therapy: Flat affect;Restless Overall Cognitive Status: Impaired/Different from baseline Area of Impairment: Orientation;Attention;Memory;Following commands;Safety/judgement;Awareness;Problem solving   Current Attention Level: Focused   Following Commands:  (not following commands  without tactile cues) Safety/Judgement: Decreased awareness of safety;Decreased awareness of deficits   Problem Solving: Slow processing;Decreased initiation;Difficulty sequencing;Requires verbal cues;Requires tactile cues      Exercises Other Exercises Other Exercises: Warm up hip knee flexion/ext rom    General Comments        Pertinent Vitals/Pain VSS    Home Living  Prior Function            PT Goals (current goals can now be found in the care plan section) Acute Rehab PT Goals Patient Stated Goal: didn't state PT Goal  Formulation: With patient Time For Goal Achievement: 04/04/14 Potential to Achieve Goals: Fair Progress towards PT goals: Progressing toward goals    Frequency  Min 3X/week    PT Plan Current plan remains appropriate    End of Session Equipment Utilized During Treatment: Oxygen Activity Tolerance: Patient limited by fatigue Patient left: in bed;with call bell/phone within reach     Time: 1520-1546 PT Time Calculation (min): 26 min  Charges:  $Therapeutic Activity: 23-37 mins                    G Codes:      Khaza Blansett, Tessie Fass 03/25/2014, 4:56 PM 03/25/2014  Donnella Sham, [place] [phone]  (pager)

## 2014-03-25 NOTE — Progress Notes (Signed)
Report given to [place] on [place], to be transferred to 2C-14 via bed with monitor, family spokesperson notified via telephone of location of new room, Esmeralda Links RN

## 2014-03-25 NOTE — Progress Notes (Signed)
Stroke Team Progress Note  HISTORY 63 y.o. 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 On 03/18/2014 she developed severe headache with nausea and vomiting and was brought to the ED where she had left-sided weakness and was found to have right temporal-occipital region with intraventricular extension into the right lateral ventricle. She was not a TPA candidate secondary to stroke with in the past 90 days and hemorrhage. Neurosurgeon consulted (Dr. Ronnald Ramp) felt no surgical intervention was necessary. She was admitted to the neuro ICU for further treatment and evaluation.  SUBJECTIVE Family members at the bedside this morning. Apparently the patient had a restless night last night. The patient is not following commands.  OBJECTIVE Most recent Vital Signs: Filed Vitals:   03/25/14 0505 03/25/14 0530 03/25/14 0535 03/25/14 0600  BP: 167/82 141/105 147/79 125/113  Pulse: 89 88 93 90  Temp:      TempSrc:      Resp: 26 28 31 30   Height:      Weight:      SpO2: 97% 95% 95% 94%   CBG (last 3)   Recent Labs  03/24/14 1913 03/25/14 0058 03/25/14 0349  GLUCAP 100* 112* 114*    IV Fluid Intake:   . sodium chloride 10 mL/hr at 03/24/14 1434  . niCARDipine Stopped (03/25/14 0038)    MEDICATIONS  . antiseptic oral rinse  15 mL Mouth Rinse q12n4p  . atorvastatin  20 mg Per Tube QPM  . chlorhexidine  15 mL Mouth Rinse BID  . digoxin  0.125 mg Per NG tube Daily  . diltiazem  30 mg Per Tube 4 times per day  . famotidine (PEPCID) IV  20 mg Intravenous Q12H  . feeding supplement (VITAL AF 1.2 CAL)  1,000 mL Per Tube Q24H  . furosemide  40 mg Per Tube Daily  . insulin aspart  2-6 Units Subcutaneous 6 times per day  . methimazole  5 mg Per Tube Daily  . metoprolol tartrate  75 mg Per  Tube BID  . mometasone-formoterol  2 puff Inhalation BID  . piperacillin-tazobactam (ZOSYN)  IV  3.375 g Intravenous Q8H  . valproate sodium  500 mg Intravenous 3 times per day  . vancomycin  750 mg Intravenous Q12H   PRN:  acetaminophen, acetaminophen, food thickener, ipratropium, labetalol, levalbuterol  Diet:  Dysphagia 1 nectar thick Activity:  Up with assistance DVT Prophylaxis:  SCDs  CLINICALLY SIGNIFICANT STUDIES Basic Metabolic Panel:   Recent Labs Lab 03/20/14 1104 03/25/14 0440  NA 135* 140  K 4.2 3.3*  CL 99 93*  CO2 21 31  GLUCOSE 111* 114*  BUN 18 16  CREATININE 0.55 0.51  CALCIUM 9.0 9.5   Liver Function Tests:   Recent Labs Lab 03/18/14 1057  AST 36  ALT 13  ALKPHOS 156*  BILITOT 0.8  PROT 8.9*  ALBUMIN 4.0   CBC:   Recent Labs Lab 03/18/14 1057 03/18/14 1109 03/20/14 1104  WBC 22.1*  --  11.3*  NEUTROABS 19.9*  --   --   HGB 15.0 18.4* 11.6*  HCT 48.3* 54.0* 39.1  MCV 71.8*  --  75.2*  PLT 381  --  228   Coagulation:   Recent Labs Lab 03/19/14 0550 03/19/14 1156 03/20/14 0248 03/21/14 0215  LABPROT 13.3 13.2 13.8 13.4  INR  1.03 1.02 1.08 1.04   Cardiac Enzymes: No results found for this basename: CKTOTAL, CKMB, CKMBINDEX, TROPONINI,  in the last 168 hours Urinalysis:   Recent Labs Lab 03/19/14 0620 03/24/14 1350  COLORURINE AMBER* AMBER*  LABSPEC 1.018 1.023  PHURINE 6.5 7.0  GLUCOSEU 100* NEGATIVE  HGBUR NEGATIVE SMALL*  BILIRUBINUR SMALL* SMALL*  KETONESUR 15* 40*  PROTEINUR >300* 100*  UROBILINOGEN 1.0 4.0*  NITRITE NEGATIVE POSITIVE*  LEUKOCYTESUR NEGATIVE LARGE*   Lipid Panel    Component Value Date/Time   CHOL 159 03/19/2014 0550   TRIG 134 03/19/2014 0550   HDL 58 03/19/2014 0550   CHOLHDL 2.7 03/19/2014 0550   VLDL 27 03/19/2014 0550   LDLCALC 74 03/19/2014 0550   HgbA1C  Lab Results  Component Value Date   HGBA1C 6.1* 03/19/2014    Urine Drug Screen:     Component Value Date/Time   LABOPIA  POSITIVE* 12/07/2013 2131   COCAINSCRNUR NONE DETECTED 12/07/2013 2131   LABBENZ NONE DETECTED 12/07/2013 2131   AMPHETMU NONE DETECTED 12/07/2013 2131   THCU NONE DETECTED 12/07/2013 2131   LABBARB NONE DETECTED 12/07/2013 2131    Alcohol Level: No results found for this basename: ETH,  in the last 168 hours   CT of the brain   03/22/2014 Similar appearance of right temporal parietal 6.5 x 3.4 cm intraparenchymal hemorrhage with intraventricular extension, similar right left midline shift. No rebleed. No hydrocephalus.  03/20/2014    Stable parenchymal hematoma in the right temporo-occipital region, as described above.  Mild surrounding vasogenic edema, extension into the left lateral ventricle, and 4 mm leftward midline shift, grossly unchanged.  Stable cortical effacement/loss of sulcation involving the right cerebral hemisphere, indicating cerebral edema.    03/19/2014    1. No significant interval change in intra-axial hematoma involving the right temporal-occipital region with intraventricular extension into the right lateral ventricle. The intraparenchymal hematoma measures 6.4 x 3.7 cm on today's exam, previously 6.4 x 3.7 cm on 03/18/2014. 2. Similar appearance of diffuse edema involving the right cerebral hemisphere. Similar leftward midline shift of 5 mm. 3. Similar enlargement of the temporal horns of the lateral ventricles bilaterally. No hydrocephalus. Basilar cisterns remain patent. 4. No new intracranial hemorrhage or infarct identified.    03/18/2014   1. 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 Intra-axial hematoma with estimated volume of 53 mm, plus secondary extension of hemorrhage into the right lateral ventricle. 2. Generalized right hemisphere edema. Leftward midline shift of 5 mm. 3. Mild enlargement of the temporal horns.   2D Echocardiogram  Left ventricle: The cavity size was normal. Wall thickness was normal.  Systolic function was normal. The estimated ejection fraction was in the range of 55% to 60%. Wall motion was normal; there were no regional wall motion abnormalities.  Carotid Doppler  03/18/2014 1-39% internal carotid artery stenosis bilaterally. Vertebral arteries are patent with antegrade flow.  CXR   03/24/2014 Basilar infiltrates versus atelectasis. Further evaluation with PA  and lateral chest radiograph recommended all. Feeding tube appreciated tip projecting in the fundal region of the stomach.  03/18/2014   No edema or consolidation.     EEG 03/22/2014 This EEG is most consistent with a generalized nonspecific cerebral dysfunction. This EEG represents a marked improvement compared to the EEG of yesterday. There was no seizure recorded on this study.  03/21/2014 This recorded 2 likely epileptic seizures addition to a moderate to severe generalized nonspecific cerebral dysfunction. There  was no seizure or seizure predisposition recorded on this study.   EKG  A-fib.   Therapy Recommendations CIR  Physical Exam  General: in bed  Resp: non-laboured breathing  CV: Irregular  Abd: NT, ND  Ext: no edema  Mental Status:  Patient is drowsy and can easily be aroused and follows  Commands well   but does have mild  left neglect  Cranial Nerves:  II: L hemianopia. Pupils are equal, round, and reactive to light. Discs are difficult to visualize.  III,IV, VI: EOMI without ptosis or diploplia.  V: Facial sensation is symmetric to temperature  VII: Facial movement is decreased on left  VIII: hearing is intact to voice  X: Uvula elevates symmetrically  XI: Shoulder shrug is symmetric.  XII: tongue is midline without atrophy or fasciculations.  Motor:  Tone is normal. Bulk is normal. 5/5 strength was present on the right, significant weakness on the left.  Sensory:  Sensation is decreased on the left,    Deep Tendon Reflexes:  2+ and symmetric in the biceps and patellae.  Cerebellar:   FNF and HKS are intact on right  Gait:  Not assessed    ASSESSMENT Colleen [name] is a 64 y.o. female with hemorrhagic conversion of an ischemic infarct that occurred 03/16/2014. Imaging confirms a right temporo-occipital region with intraventricular extension and cerebral edema with leftward midline shift. She had been on warfarin (now on hold), INR on arrival is 2.61. It was reversed with anticoagulation reversal protocol down to 1.04. Previous infarct felt to be embolic secondary to atrial fibrillation. Had been on coumadin in the past, now on hold d/t hemorrhage. Patient with resultant lethargy that seemed out of proportion to size of hemorrhage - EEG revealed non-convulsive status improved on Depacon, level 70.5 acceptable. Patient with resulting dysarthria, dysphagia and left hemiparesis. No change in neuro status.  RN noted labored respirations during the night 3/25 - increased rhonchi, wheezing and crackles, unproductive cough, given lasix. This was found to be stable during rounds by Dr. Tilden Dome. Again with respiratory distress during the night 3/26. Seen by Aram Beecham;  transferred back to the ICU. Dr. Leonie Man discussed with Dr. Alva Garnet who feels pt with developing pneumonia. Not in respiratory distress currently.  Atrial fibrillation - recurred in setting of TIA, placed on coumadin with plans for lifelong treatment Hypertension Hyperlipidemia, LDL 74, on lipitor 20 mg daily PTA, now on lipitor 20 md daily, goal LDL < 70 for diabetics Diabetes Type 2, HgbA1c 6.1, goal < 7.0 Decreased UOP, started on IVF, improved Hyponatremia, 135 Hx stroke - 2009 stroke and TIA, 11/2013 TIA w/ afib tachycardia  Neurosurgery consult Ronnald Ramp) seen and signed off.  Hospital day # 7  TREATMENT/PLAN  May be able transfer to step down today.  CCM to manage respiratory status  No indication for CT or other neuro test. Neuro stable.  Disposition: IP Rehab, they are following.  UA on 03/24/2014  consistent with urinary tract infection - currently on Zosyn and vancomycin.  Mikey Bussing PA-C [facility] Pager [phone] 03/25/2014, 7:46 AM   I have personally obtained a history, examined the patient, evaluated imaging results, and formulated the assessment and plan of care. I agree with the above.  Antony Contras, MD  To contact Stroke Continuity provider, please refer to [email or web address] After hours, contact General Neurology

## 2014-03-26 DIAGNOSIS — I629 Nontraumatic intracranial hemorrhage, unspecified: Secondary | ICD-10-CM

## 2014-03-26 DIAGNOSIS — J189 Pneumonia, unspecified organism: Secondary | ICD-10-CM

## 2014-03-26 DIAGNOSIS — I4891 Unspecified atrial fibrillation: Secondary | ICD-10-CM

## 2014-03-26 DIAGNOSIS — J9620 Acute and chronic respiratory failure, unspecified whether with hypoxia or hypercapnia: Secondary | ICD-10-CM | POA: Diagnosis present

## 2014-03-26 DIAGNOSIS — I6389 Other cerebral infarction: Secondary | ICD-10-CM | POA: Diagnosis present

## 2014-03-26 DIAGNOSIS — I6350 Cerebral infarction due to unspecified occlusion or stenosis of unspecified cerebral artery: Secondary | ICD-10-CM

## 2014-03-26 LAB — CBC
HCT: 39.5 % (ref 36.0–46.0)
Hemoglobin: 11.9 g/dL — ABNORMAL LOW (ref 12.0–15.0)
MCH: 22.5 pg — AB (ref 26.0–34.0)
MCHC: 30.1 g/dL (ref 30.0–36.0)
MCV: 74.5 fL — AB (ref 78.0–100.0)
Platelets: 242 10*3/uL (ref 150–400)
RBC: 5.3 MIL/uL — AB (ref 3.87–5.11)
RDW: 20 % — ABNORMAL HIGH (ref 11.5–15.5)
WBC: 16.6 10*3/uL — ABNORMAL HIGH (ref 4.0–10.5)

## 2014-03-26 LAB — BASIC METABOLIC PANEL
BUN: 19 mg/dL (ref 6–23)
CO2: 31 meq/L (ref 19–32)
Calcium: 9.2 mg/dL (ref 8.4–10.5)
Chloride: 93 mEq/L — ABNORMAL LOW (ref 96–112)
Creatinine, Ser: 0.52 mg/dL (ref 0.50–1.10)
GFR calc Af Amer: >90 mL/min (ref >90)
GLUCOSE: 120 mg/dL — AB (ref 70–99)
Potassium: 3.1 mEq/L — ABNORMAL LOW (ref 3.7–5.3)
SODIUM: 138 meq/L (ref 137–147)

## 2014-03-26 LAB — GLUCOSE, CAPILLARY
GLUCOSE-CAPILLARY: 127 mg/dL — AB (ref 70–99)
GLUCOSE-CAPILLARY: 137 mg/dL — AB (ref 70–99)
GLUCOSE-CAPILLARY: 151 mg/dL — AB (ref 70–99)
Glucose-Capillary: 137 mg/dL — ABNORMAL HIGH (ref 70–99)
Glucose-Capillary: 125 mg/dL — ABNORMAL HIGH (ref 70–99)
Glucose-Capillary: 138 mg/dL — ABNORMAL HIGH (ref 70–99)

## 2014-03-26 LAB — PROCALCITONIN: Procalcitonin: <0.1 ng/mL

## 2014-03-26 MED ORDER — POTASSIUM CHLORIDE 10 MEQ/100ML IV SOLN
10.0000 meq | INTRAVENOUS | Status: AC
  Administered 2014-03-26 (×2): 10 meq via INTRAVENOUS
  Filled 2014-03-26 (×2): qty 100

## 2014-03-26 MED ORDER — METOPROLOL TARTRATE 1 MG/ML IV SOLN
5.0000 mg | INTRAVENOUS | Status: DC
  Administered 2014-03-26 – 2014-03-28 (×13): 5 mg via INTRAVENOUS
  Filled 2014-03-26 (×18): qty 5

## 2014-03-26 MED ORDER — METOPROLOL TARTRATE 1 MG/ML IV SOLN: 5.0000 mg | INTRAVENOUS | Status: DC | PRN

## 2014-03-26 NOTE — Progress Notes (Signed)
Stroke Team Progress Note  HISTORY 63 y.o. female with a history of recent right occipital infarct on Wednesday 03/16/2014 who was seen at [facility] following a fall where a CT showed pethechial hemorrhage, she was d/c home after stopping her coumadin which she takes for atrial fibrillation. On 03/18/2014 she developed severe headache with nausea and vomiting and was brought to the ED where she had left-sided weakness and was found to have right temporal-occipital region with intraventricular extension into the right lateral ventricle. She was not a TPA candidate secondary to stroke with in the past 90 days and hemorrhage. Neurosurgeon consulted (Dr. Ronnald [name]) felt no surgical intervention was necessary. She was admitted to the neuro ICU for further treatment and evaluation.  SUBJECTIVE Family present, states she is doing well, they feel she is interacting more with them. Patient unable to provide any history.   OBJECTIVE Most recent Vital Signs: Filed Vitals:   03/26/14 0055 03/26/14 0525 03/26/14 0752 03/26/14 0845  BP:    116/99  Pulse:  213  100  Temp:  98.9 F (37.2 C)  99.2 F (37.3 C)  TempSrc:  Oral  Oral  Resp:  42  26  Height:      Weight:  154 lb 1.6 oz (69.9 kg)    SpO2: 99% 96% 97% 96%   CBG (last 3)   Recent Labs  03/26/14 0027 03/26/14 0520 03/26/14 0852  GLUCAP 127* 137* 125*    IV Fluid Intake:   . sodium chloride 10 mL/hr at 03/24/14 1434    MEDICATIONS  . albuterol  2.5 mg Nebulization Q6H  . antiseptic oral rinse  15 mL Mouth Rinse q12n4p  . atorvastatin  20 mg Per Tube QPM  . budesonide  0.25 mg Nebulization Q6H  . chlorhexidine  15 mL Mouth Rinse BID  . digoxin  0.125 mg Per NG tube Daily  . diltiazem  30 mg Per Tube 4 times per day  . feeding supplement (VITAL AF 1.2 CAL)  1,000 mL Per Tube Q24H  . insulin aspart  0-9 Units Subcutaneous 6 times per day  . methimazole  5 mg Per Tube Daily  . metoprolol  5 mg Intravenous Q4H  . metoprolol  tartrate  50 mg Per Tube TID  . pantoprazole sodium  40 mg Per Tube Q1200  . piperacillin-tazobactam (ZOSYN)  IV  3.375 g Intravenous Q8H  . valproate sodium  500 mg Intravenous 3 times per day   PRN:  acetaminophen, acetaminophen, albuterol, food thickener, ipratropium  Diet:    1 nectar thick Activity:  Up with assistance DVT Prophylaxis:  SCDs  CLINICALLY SIGNIFICANT STUDIES Basic Metabolic Panel:   Recent Labs Lab 03/25/14 0440 03/26/14 0320  NA 140 138  K 3.3* 3.1*  CL 93* 93*  CO2 31 31  GLUCOSE 114* 120*  BUN 16 19  CREATININE 0.51 0.52  CALCIUM 9.5 9.2   Liver Function Tests:  No results found for this basename: AST, ALT, ALKPHOS, BILITOT, PROT, ALBUMIN,  in the last 168 hours CBC:   Recent Labs Lab 03/20/14 1104 03/26/14 0320  WBC 11.3* 16.6*  HGB 11.6* 11.9*  HCT 39.1 39.5  MCV 75.2* 74.5*  PLT 228 242   Coagulation:   Recent Labs Lab 03/19/14 1156 03/20/14 0248 03/21/14 0215  LABPROT 13.2 13.8 13.4  INR 1.02 1.08 1.04   Cardiac Enzymes: No results found for this basename: CKTOTAL, CKMB, CKMBINDEX, TROPONINI,  in the last 168 hours Urinalysis:   Recent Labs Lab  03/24/14 1350  COLORURINE AMBER*  LABSPEC 1.023  PHURINE 7.0  GLUCOSEU NEGATIVE  HGBUR SMALL*  BILIRUBINUR SMALL*  KETONESUR 40*  PROTEINUR 100*  UROBILINOGEN 4.0*  NITRITE POSITIVE*  LEUKOCYTESUR LARGE*   Lipid Panel    Component Value Date/Time   CHOL 159 03/19/2014 0550   TRIG 134 03/19/2014 0550   HDL 58 03/19/2014 0550   CHOLHDL 2.7 03/19/2014 0550   VLDL 27 03/19/2014 0550   LDLCALC 74 03/19/2014 0550   HgbA1C  Lab Results  Component Value Date   HGBA1C 6.1* 03/19/2014    Urine Drug Screen:     Component Value Date/Time   LABOPIA POSITIVE* 12/07/2013 2131   COCAINSCRNUR NONE DETECTED 12/07/2013 2131   LABBENZ NONE DETECTED 12/07/2013 2131   AMPHETMU NONE DETECTED 12/07/2013 2131   THCU NONE DETECTED 12/07/2013 2131   LABBARB NONE DETECTED 12/07/2013 2131    Alcohol  Level: No results found for this basename: ETH,  in the last 168 hours   CT of the brain   03/22/2014 Similar appearance of right temporal parietal 6.5 x 3.4 cm intraparenchymal hemorrhage with intraventricular extension, similar right left midline shift. No rebleed. No hydrocephalus.  03/20/2014    Stable parenchymal hematoma in the right temporo-occipital region, as described above.  Mild surrounding vasogenic edema, extension into the left lateral ventricle, and 4 mm leftward midline shift, grossly unchanged.  Stable cortical effacement/loss of sulcation involving the right cerebral hemisphere, indicating cerebral edema.    03/19/2014    1. No significant interval change in intra-axial hematoma involving the right temporal-occipital region with intraventricular extension into the right lateral ventricle. The intraparenchymal hematoma measures 6.4 x 3.7 cm on today's exam, previously 6.4 x 3.7 cm on 03/18/2014. 2. Similar appearance of diffuse edema involving the right cerebral hemisphere. Similar leftward midline shift of 5 mm. 3. Similar enlargement of the temporal horns of the lateral ventricles bilaterally. No hydrocephalus. Basilar cisterns remain patent. 4. No new intracranial hemorrhage or infarct identified.    03/18/2014   1. 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 Intra-axial hematoma with estimated volume of 53 mm, plus secondary extension of hemorrhage into the right lateral ventricle. 2. Generalized right hemisphere edema. Leftward midline shift of 5 mm. 3. Mild enlargement of the temporal horns.   2D Echocardiogram  Left ventricle: The cavity size was normal. Wall thickness was normal. Systolic function was normal. The estimated ejection fraction was in the range of 55% to 60%. Wall motion was normal; there were no regional wall motion abnormalities.  Carotid Doppler  03/18/2014 1-39% internal carotid artery stenosis  bilaterally. Vertebral arteries are patent with antegrade flow.  CXR   03/24/2014 Basilar infiltrates versus atelectasis. Further evaluation with PA  and lateral chest radiograph recommended all. Feeding tube appreciated tip projecting in the fundal region of the stomach.  03/18/2014   No edema or consolidation.     EEG 03/22/2014 This EEG is most consistent with a generalized nonspecific cerebral dysfunction. This EEG represents a marked improvement compared to the EEG of yesterday. There was no seizure recorded on this study.  03/21/2014 This recorded 2 likely epileptic seizures addition to a moderate to severe generalized nonspecific cerebral dysfunction. There was no seizure or seizure predisposition recorded on this study.   EKG  A-fib.   Therapy Recommendations CIR  Physical Exam  General: in bed  Resp: non-laboured breathing  CV: Irregular  Abd: NT, ND  Ext: no edema  Mental Status:  Patient is drowsy and can easily be aroused but not following commands well. Will blink eyes and occasionally move right side to command   Cranial Nerves:  II: L hemianopia. Pupils are equal, round, and reactive to light. III,IV, VI: right gaze deviation VII: Facial movement is decreased on left  VIII: hearing is intact to voice  X: Uvula elevates symmetrically  XII: tongue is midline without atrophy or fasciculations.  Motor:  Tone is normal. Bulk is normal. Moves right side against gravity and light resistance, not noted to move left side against gravity but exam limited due to mental status Sensory:  Decreased withdrawal to noxious stimuli on the left Deep Tendon Reflexes:  2+ and symmetric in the biceps and patellae.  Cerebellar:  UTO Gait:  Not assessed    ASSESSMENT Colleen [name] is a 63 y.o. female with hemorrhagic conversion of an ischemic infarct that occurred 03/16/2014. Imaging confirms a right temporo-occipital region with intraventricular extension and cerebral edema with  leftward midline shift. She had been on warfarin (now on hold), INR on arrival is 2.61. It was reversed with anticoagulation reversal protocol down to 1.04. Previous infarct felt to be embolic secondary to atrial fibrillation. Had been on coumadin in the past, now on hold d/t hemorrhage. Patient with resultant lethargy that seemed out of proportion to size of hemorrhage - EEG revealed non-convulsive status improved on Depacon, level 70.5 acceptable. Patient with resulting dysarthria, dysphagia and left hemiparesis. No change in neuro status.  RN noted labored respirations during the night 3/25 - increased rhonchi, wheezing and crackles, unproductive cough, given lasix. This was found to be stable during rounds by Dr. Tilden Dome. Again with respiratory distress during the night 3/26. Seen by Aram Beecham;  transferred back to the ICU. Dr. Leonie Man discussed with Dr. Alva Garnet who feels pt with developing pneumonia. Not in respiratory distress currently.  Atrial fibrillation - recurred in setting of TIA, placed on coumadin with plans for lifelong treatment Hypertension Hyperlipidemia, LDL 74, on lipitor 20 mg daily PTA, now on lipitor 20 md daily, goal LDL < 70 for diabetics Diabetes Type 2, HgbA1c 6.1, goal < 7.0 Decreased UOP, started on IVF, improved Hyponatremia, 135 Hx stroke - 2009 stroke and TIA, 11/2013 TIA w/ afib tachycardia  Neurosurgery consult Colleen [name]) seen and signed off.  Hospital day # 8  TREATMENT/PLAN  No indication for CT or other neuro test. Neuro stable.  Minimally following commands, family reports increased interaction, no indication for repeat EEG at this time  Continue VPA 500mg  TID  Disposition: IP Rehab, they are following.  UA on 03/24/2014 consistent with urinary tract infection - currently on Zosyn and vancomycin.  Jim Like, DO Neurology-Stroke  To contact Stroke Continuity provider, please refer to [email or web address] After hours, contact General Neurology

## 2014-03-26 NOTE — Progress Notes (Signed)
[facility] Progress Note [place] TEAM 1 - Stepdown/ICU TEAM   Colleen [name] XLK:[id number] DOB: February 28, 1951 DOA: 03/18/2014 PCP: [name], NP  Brief narrative: Colleen [name] is a 63 y.o. female presenting on 03/18/2014 with  has a past medical history of Hypertension; Hiatal hernia; Adenomatous polyp of colon; Acute asthmatic bronchitis; GERD; COPD; IBS  Esophagitis; Chronic gastritis; Duodenitis without hemorrhage (10/01/2001); Diverticulosis; colonic adenomas (02/05/2008); Anemia; Hyperlipidemia; Decreased peripheral vision of right eye;  Hyperthyroidism; Type II diabetes mellitus; Stroke; Arthritis; Depression; On home oxygen therapy; PAF (paroxysmal atrial fibrillation); and Atrial flutter who presents with increasing left sided weakness. She was admitted to [facility] for a right occipital infarct on 3/66 felt to be embolic from A-fib. CT also showed a petechial hemorrhage and her coumadin was discontinued. She is found here to have hemorrhagic conversion of her ischemic infarct. INR was 2.61 despite stopping coumadin. This was reversed with Vit K. RN noted labored respirations during the night 3/25 - increased rhonchi, wheezing and crackles, unproductive cough, given lasix. This was found to be stable during rounds by Dr. Tilden Dome. Again with respiratory distress during the night 3/26. Seen by Aram Beecham; transferred back to the ICU. Suspect by ICU team that pt was developing pneumonia. She was transferred out of the  ICU again and onto the Hospitalist service on 3/28 as she now has other acute medical issues in addition to her CVA.     Subjective: Not answering questions for me.   Assessment/Plan: Principal Problem:   Acute hemorrhagic infarction of brain - per Neuro - CIR consulted - may need a PEG  Active Problems:    Atrial fibrillation - continues to have HR in 110-120 - on Cardizem and Lopressor- Add IV Lopressor to be given as long as BP can tolerate    COPD-- HAP  (hospital-acquired pneumonia) - on 2 L O2 at baseline- stable    Diabetes mellitus - stable on sensitive sliding scale   HAP (hospital-acquired pneumonia) - RLL atelectasis vs infiltrate on cxr on 3/27 - on Vanc and Zosyn- still with low grade fevers  Sepsis with Enterobacter UTI - per UA on 3/26 - cont Zosyn- will need a 7 day course of antibiotics for this as it may be foley related    Diastolic dysfunction grade 2 by Echo 12/14 - compensated  Hypokalemia - replace     OBESITY  HLP - statin   Code Status: Full code Family Communication: none Disposition Plan: follow in SDU- evaluate for CIR  Consultants: Neuro  Procedures: 2D Echocardiogram Left ventricle: The cavity size was normal. Wall thickness was normal. Systolic function was normal. The estimated ejection fraction was in the range of 55% to 60%. Wall motion was normal; there were no regional wall motion abnormalities.  Carotid Doppler 03/18/2014 1-39% internal carotid artery stenosis bilaterally. Vertebral arteries are patent with antegrade flow. EEG  03/22/2014 This EEG is most consistent with a generalized nonspecific cerebral dysfunction. This EEG represents a marked improvement compared to the EEG of yesterday. There was no seizure recorded on this study.  03/21/2014 This recorded 2 likely epileptic seizures addition to a moderate to severe generalized nonspecific cerebral dysfunction. There was no seizure or seizure predisposition recorded on this study.   Antibiotics: Antibiotics Given (last 72 hours)   Date/Time Action Medication Dose Rate   03/24/14 1430 Given   vancomycin (VANCOCIN) 1,250 mg in sodium chloride 0.9 % 250 mL IVPB 1,250 mg 166.7 mL/hr   03/24/14 1653 Given  [limited  access]   piperacillin-tazobactam (ZOSYN) IVPB 3.375 g 3.375 g 12.5 mL/hr   03/25/14 0038 Given   piperacillin-tazobactam (ZOSYN) IVPB 3.375 g 3.375 g 12.5 mL/hr   03/25/14 0144 Given   vancomycin (VANCOCIN) IVPB 750 mg/150  ml premix 750 mg 150 mL/hr   03/25/14 0935 Given   piperacillin-tazobactam (ZOSYN) IVPB 3.375 g 3.375 g 12.5 mL/hr   03/25/14 1634 Given   piperacillin-tazobactam (ZOSYN) IVPB 3.375 g 3.375 g 12.5 mL/hr   03/26/14 0155 Given   piperacillin-tazobactam (ZOSYN) IVPB 3.375 g 3.375 g 12.5 mL/hr       DVT prophylaxis: SCD  Objective: Filed Weights   03/25/14 0500 03/25/14 1655 03/26/14 0525  Weight: 67.4 kg (148 lb 9.4 oz) 71.1 kg (156 lb 12 oz) 69.9 kg (154 lb 1.6 oz)   Blood pressure 156/76, pulse 213, temperature 98.9 F (37.2 C), temperature source Oral, resp. rate 42, height 5\' 5"  (1.651 m), weight 69.9 kg (154 lb 1.6 oz), SpO2 97.00%.  Intake/Output Summary (Last 24 hours) at 03/26/14 0847 Last data filed at 03/26/14 0600  Gross per 24 hour  Intake 1326.67 ml  Output    875 ml  Net 451.67 ml     Exam: General: non communicative, confused, restless  No acute respiratory distress Lungs: Clear to auscultation bilaterally without wheezes or crackles- RR fluctuating from 20-30s Cardiovascular: Regular rate and rhythm without murmur gallop or rub normal S1 and S2 Abdomen: Nontender, nondistended, soft, bowel sounds positive, no rebound, no ascites, no appreciable mass- NG tube present Extremities: No significant cyanosis, clubbing, or edema bilateral lower extremities  Data Reviewed: Basic Metabolic Panel:  Recent Labs Lab 03/20/14 1104 03/25/14 0440 03/26/14 0320  NA 135* 140 138  K 4.2 3.3* 3.1*  CL 99 93* 93*  CO2 21 31 31   GLUCOSE 111* 114* 120*  BUN 18 16 19   CREATININE 0.55 0.51 0.52  CALCIUM 9.0 9.5 9.2   Liver Function Tests: No results found for this basename: AST, ALT, ALKPHOS, BILITOT, PROT, ALBUMIN,  in the last 168 hours No results found for this basename: LIPASE, AMYLASE,  in the last 168 hours No results found for this basename: AMMONIA,  in the last 168 hours CBC:  Recent Labs Lab 03/20/14 1104 03/26/14 0320  WBC 11.3* 16.6*  HGB 11.6* 11.9*   HCT 39.1 39.5  MCV 75.2* 74.5*  PLT 228 242   Cardiac Enzymes: No results found for this basename: CKTOTAL, CKMB, CKMBINDEX, TROPONINI,  in the last 168 hours BNP (last 3 results)  Recent Labs  02/14/14 1729  PROBNP 6018.0*   CBG:  Recent Labs Lab 03/25/14 1617 03/25/14 1703 03/25/14 2005 03/26/14 0027 03/26/14 0520  GLUCAP 94 111* 125* 127* 137*    Recent Results (from the past 240 hour(s))  MRSA PCR SCREENING     Status: None   Collection Time    03/18/14  5:38 PM      Result Value Ref Range Status   MRSA by PCR NEGATIVE  NEGATIVE Final   Comment:            The GeneXpert MRSA Assay (FDA     approved for NASAL specimens     only), is one component of a     comprehensive MRSA colonization     surveillance program. It is not     intended to diagnose MRSA     infection nor to guide or     monitor treatment for     MRSA infections.  MRSA  PCR SCREENING     Status: None   Collection Time    03/24/14  9:06 AM      Result Value Ref Range Status   MRSA by PCR NEGATIVE  NEGATIVE Final   Comment:            The GeneXpert MRSA Assay (FDA     approved for NASAL specimens     only), is one component of a     comprehensive MRSA colonization     surveillance program. It is not     intended to diagnose MRSA     infection nor to guide or     monitor treatment for     MRSA infections.  CULTURE, BLOOD (ROUTINE X 2)     Status: None   Collection Time    03/24/14  1:00 PM      Result Value Ref Range Status   Specimen Description BLOOD RIGHT HAND   Final   Special Requests BOTTLES DRAWN AEROBIC ONLY 3CC   Final   Culture  Setup Time     Final   Value: 03/24/2014 17:01     Performed at [facility]   Culture     Final   Value:        BLOOD CULTURE RECEIVED NO GROWTH TO DATE CULTURE WILL BE HELD FOR 5 DAYS BEFORE ISSUING A FINAL NEGATIVE REPORT     Performed at [facility]   Report Status PENDING   Incomplete  CULTURE, BLOOD (ROUTINE X 2)     Status:  None   Collection Time    03/24/14  1:15 PM      Result Value Ref Range Status   Specimen Description BLOOD RIGHT ARM   Final   Special Requests BOTTLES DRAWN AEROBIC ONLY 5CC   Final   Culture  Setup Time     Final   Value: 03/24/2014 17:01     Performed at [facility]   Culture     Final   Value:        BLOOD CULTURE RECEIVED NO GROWTH TO DATE CULTURE WILL BE HELD FOR 5 DAYS BEFORE ISSUING A FINAL NEGATIVE REPORT     Performed at [facility]   Report Status PENDING   Incomplete  URINE CULTURE     Status: None   Collection Time    03/24/14  1:50 PM      Result Value Ref Range Status   Specimen Description URINE, CATHETERIZED   Final   Special Requests NONE   Final   Culture  Setup Time     Final   Value: 03/24/2014 17:03     Performed at [facility] Count     Final   Value: >=100,000 COLONIES/ML     Performed at [facility]   Culture     Final   Value: ENTEROBACTER CLOACAE     GRAM NEGATIVE RODS     Performed at [facility]   Report Status PENDING   Incomplete   Organism ID, Bacteria ENTEROBACTER CLOACAE   Final     Studies:  Recent x-ray studies have been reviewed in detail by the Attending Physician  Scheduled Meds:  Scheduled Meds: . albuterol  2.5 mg Nebulization Q6H  . antiseptic oral rinse  15 mL Mouth Rinse q12n4p  . atorvastatin  20 mg Per Tube QPM  . budesonide  0.25 mg Nebulization Q6H  . chlorhexidine  15 mL Mouth  Rinse BID  . digoxin  0.125 mg Per NG tube Daily  . diltiazem  30 mg Per Tube 4 times per day  . feeding supplement (VITAL AF 1.2 CAL)  1,000 mL Per Tube Q24H  . insulin aspart  0-9 Units Subcutaneous 6 times per day  . methimazole  5 mg Per Tube Daily  . metoprolol tartrate  50 mg Per Tube TID  . pantoprazole sodium  40 mg Per Tube Q1200  . piperacillin-tazobactam (ZOSYN)  IV  3.375 g Intravenous Q8H  . potassium chloride  10 mEq Intravenous Q1 Hr x 2  . valproate sodium  500 mg  Intravenous 3 times per day   Continuous Infusions: . sodium chloride 10 mL/hr at 03/24/14 1434    Time spent on care of this patient: >35   Debbe Odea, MD  [facility] Office  [phone] Pager - Text Page per Shea Evans as per below:  On-Call/Text Page:      [email or web address]  If 7PM-7AM, please contact night-coverage [email or web address] 03/26/2014, 8:47 AM   LOS: 8 days

## 2014-03-27 ENCOUNTER — Inpatient Hospital Stay (HOSPITAL_COMMUNITY): Payer: Medicaid Other

## 2014-03-27 DIAGNOSIS — J9620 Acute and chronic respiratory failure, unspecified whether with hypoxia or hypercapnia: Secondary | ICD-10-CM

## 2014-03-27 LAB — BASIC METABOLIC PANEL
BUN: 19 mg/dL (ref 6–23)
CALCIUM: 9 mg/dL (ref 8.4–10.5)
CO2: 36 mEq/L — ABNORMAL HIGH (ref 19–32)
CREATININE: 0.44 mg/dL — AB (ref 0.50–1.10)
Chloride: 89 mEq/L — ABNORMAL LOW (ref 96–112)
GFR calc non Af Amer: >90 mL/min (ref >90)
Glucose, Bld: 150 mg/dL — ABNORMAL HIGH (ref 70–99)
Potassium: 3.2 mEq/L — ABNORMAL LOW (ref 3.7–5.3)
Sodium: 135 mEq/L — ABNORMAL LOW (ref 137–147)

## 2014-03-27 LAB — URINE CULTURE: Colony Count: >=100000

## 2014-03-27 LAB — GLUCOSE, CAPILLARY
GLUCOSE-CAPILLARY: 128 mg/dL — AB (ref 70–99)
GLUCOSE-CAPILLARY: 134 mg/dL — AB (ref 70–99)
GLUCOSE-CAPILLARY: 139 mg/dL — AB (ref 70–99)
Glucose-Capillary: 138 mg/dL — ABNORMAL HIGH (ref 70–99)
Glucose-Capillary: 130 mg/dL — ABNORMAL HIGH (ref 70–99)
Glucose-Capillary: 131 mg/dL — ABNORMAL HIGH (ref 70–99)

## 2014-03-27 MED ORDER — POTASSIUM CHLORIDE 20 MEQ/15ML (10%) PO LIQD
40.0000 meq | Freq: Once | ORAL | Status: AC
  Administered 2014-03-27: 40 meq via ORAL
  Filled 2014-03-27: qty 30

## 2014-03-27 MED ORDER — DILTIAZEM 12 MG/ML ORAL SUSPENSION
60.0000 mg | Freq: Four times a day (QID) | ORAL | Status: DC
  Administered 2014-03-28 (×3): 60 mg
  Filled 2014-03-27 (×6): qty 6

## 2014-03-27 NOTE — Progress Notes (Signed)
Stroke Team Progress Note  HISTORY 63 y.o. female with a history of recent right occipital infarct on Wednesday 03/16/2014 who was seen at [facility] following a fall where a CT showed pethechial hemorrhage, she was d/c home after stopping her coumadin which she takes for atrial fibrillation. On 03/18/2014 she developed severe headache with nausea and vomiting and was brought to the ED where she had left-sided weakness and was found to have right temporal-occipital region with intraventricular extension into the right lateral ventricle. She was not a TPA candidate secondary to stroke with in the past 90 days and hemorrhage. Neurosurgeon consulted (Dr. Ronnald Ramp) felt no surgical intervention was necessary. She was admitted to the neuro ICU for further treatment and evaluation.  SUBJECTIVE No family present. No overnight events. Patient unable to provide any history.   OBJECTIVE Most recent Vital Signs: Filed Vitals:   03/27/14 0530 03/27/14 0600 03/27/14 0800 03/27/14 0910  BP:  155/93 155/68   Pulse: 100 100 88   Temp:   97.9 F (36.6 C)   TempSrc:   Oral   Resp: 22 21 20    Height:      Weight:      SpO2: 96% 98% 96% 97%   CBG (last 3)   Recent Labs  03/27/14 0015 03/27/14 0408 03/27/14 0843  GLUCAP 128* 134* 139*    IV Fluid Intake:   . sodium chloride 250 mL (03/26/14 2023)    MEDICATIONS  . albuterol  2.5 mg Nebulization Q6H  . antiseptic oral rinse  15 mL Mouth Rinse q12n4p  . atorvastatin  20 mg Per Tube QPM  . budesonide  0.25 mg Nebulization Q6H  . chlorhexidine  15 mL Mouth Rinse BID  . digoxin  0.125 mg Per NG tube Daily  . diltiazem  30 mg Per Tube 4 times per day  . feeding supplement (VITAL AF 1.2 CAL)  1,000 mL Per Tube Q24H  . insulin aspart  0-9 Units Subcutaneous 6 times per day  . methimazole  5 mg Per Tube Daily  . metoprolol  5 mg Intravenous Q4H  . metoprolol tartrate  50 mg Per Tube TID  . pantoprazole sodium  40 mg Per Tube Q1200  .  piperacillin-tazobactam (ZOSYN)  IV  3.375 g Intravenous Q8H  . valproate sodium  500 mg Intravenous 3 times per day   PRN:  acetaminophen, acetaminophen, albuterol, food thickener, ipratropium  Diet:    1 nectar thick Activity:  Up with assistance DVT Prophylaxis:  SCDs  CLINICALLY SIGNIFICANT STUDIES Basic Metabolic Panel:   Recent Labs Lab 03/25/14 0440 03/26/14 0320  NA 140 138  K 3.3* 3.1*  CL 93* 93*  CO2 31 31  GLUCOSE 114* 120*  BUN 16 19  CREATININE 0.51 0.52  CALCIUM 9.5 9.2   Liver Function Tests:  No results found for this basename: AST, ALT, ALKPHOS, BILITOT, PROT, ALBUMIN,  in the last 168 hours CBC:   Recent Labs Lab 03/20/14 1104 03/26/14 0320  WBC 11.3* 16.6*  HGB 11.6* 11.9*  HCT 39.1 39.5  MCV 75.2* 74.5*  PLT 228 242   Coagulation:   Recent Labs Lab 03/21/14 0215  LABPROT 13.4  INR 1.04   Cardiac Enzymes: No results found for this basename: CKTOTAL, CKMB, CKMBINDEX, TROPONINI,  in the last 168 hours Urinalysis:   Recent Labs Lab 03/24/14 1350  COLORURINE AMBER*  LABSPEC 1.023  PHURINE 7.0  GLUCOSEU NEGATIVE  HGBUR SMALL*  BILIRUBINUR SMALL*  KETONESUR 40*  PROTEINUR 100*  UROBILINOGEN 4.0*  NITRITE POSITIVE*  LEUKOCYTESUR LARGE*   Lipid Panel    Component Value Date/Time   CHOL 159 03/19/2014 0550   TRIG 134 03/19/2014 0550   HDL 58 03/19/2014 0550   CHOLHDL 2.7 03/19/2014 0550   VLDL 27 03/19/2014 0550   LDLCALC 74 03/19/2014 0550   HgbA1C  Lab Results  Component Value Date   HGBA1C 6.1* 03/19/2014    Urine Drug Screen:     Component Value Date/Time   LABOPIA POSITIVE* 12/07/2013 2131   COCAINSCRNUR NONE DETECTED 12/07/2013 2131   LABBENZ NONE DETECTED 12/07/2013 2131   AMPHETMU NONE DETECTED 12/07/2013 2131   THCU NONE DETECTED 12/07/2013 2131   LABBARB NONE DETECTED 12/07/2013 2131    Alcohol Level: No results found for this basename: ETH,  in the last 168 hours   CT of the brain   03/22/2014 Similar appearance of  right temporal parietal 6.5 x 3.4 cm intraparenchymal hemorrhage with intraventricular extension, similar right left midline shift. No rebleed. No hydrocephalus.  03/20/2014    Stable parenchymal hematoma in the right temporo-occipital region, as described above.  Mild surrounding vasogenic edema, extension into the left lateral ventricle, and 4 mm leftward midline shift, grossly unchanged.  Stable cortical effacement/loss of sulcation involving the right cerebral hemisphere, indicating cerebral edema.    03/19/2014    1. No significant interval change in intra-axial hematoma involving the right temporal-occipital region with intraventricular extension into the right lateral ventricle. The intraparenchymal hematoma measures 6.4 x 3.7 cm on today's exam, previously 6.4 x 3.7 cm on 03/18/2014. 2. Similar appearance of diffuse edema involving the right cerebral hemisphere. Similar leftward midline shift of 5 mm. 3. Similar enlargement of the temporal horns of the lateral ventricles bilaterally. No hydrocephalus. Basilar cisterns remain patent. 4. No new intracranial hemorrhage or infarct identified.    03/18/2014   1. 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 Intra-axial hematoma with estimated volume of 53 mm, plus secondary extension of hemorrhage into the right lateral ventricle. 2. Generalized right hemisphere edema. Leftward midline shift of 5 mm. 3. Mild enlargement of the temporal horns.   2D Echocardiogram  Left ventricle: The cavity size was normal. Wall thickness was normal. Systolic function was normal. The estimated ejection fraction was in the range of 55% to 60%. Wall motion was normal; there were no regional wall motion abnormalities.  Carotid Doppler  03/18/2014 1-39% internal carotid artery stenosis bilaterally. Vertebral arteries are patent with antegrade flow.  CXR   03/24/2014 Basilar infiltrates versus atelectasis. Further  evaluation with PA  and lateral chest radiograph recommended all. Feeding tube appreciated tip projecting in the fundal region of the stomach.  03/18/2014   No edema or consolidation.     EEG 03/22/2014 This EEG is most consistent with a generalized nonspecific cerebral dysfunction. This EEG represents a marked improvement compared to the EEG of yesterday. There was no seizure recorded on this study.  03/21/2014 This recorded 2 likely epileptic seizures addition to a moderate to severe generalized nonspecific cerebral dysfunction. There was no seizure or seizure predisposition recorded on this study.   EKG  A-fib.   Therapy Recommendations CIR  Physical Exam  General: in bed  Resp: non-laboured breathing  CV: Irregular  Abd: NT, ND  Ext: no edema  Mental Status:  Patient is drowsy and can easily be aroused but not following commands well. Will blink eyes and occasionally move right side to command  Cranial Nerves:  II: L hemianopia. Pupils are equal, round, and reactive to light. III,IV, VI: right gaze deviation VII: Facial movement is decreased on left  VIII: hearing is intact to voice  X: Uvula elevates symmetrically  XII: tongue is midline without atrophy or fasciculations.  Motor:  Tone is normal. Bulk is normal. Moves right side against gravity and light resistance, not noted to move left side against gravity but exam limited due to mental status Sensory:  Decreased withdrawal to noxious stimuli on the left Deep Tendon Reflexes:  2+ and symmetric in the biceps and patellae.  Cerebellar:  UTO Gait:  Not assessed    ASSESSMENT Colleen [name] is a 63 y.o. female with hemorrhagic conversion of an ischemic infarct that occurred 03/16/2014. Imaging confirms a right temporo-occipital region with intraventricular extension and cerebral edema with leftward midline shift. She had been on warfarin (now on hold), INR on arrival is 2.61. It was reversed with anticoagulation reversal  protocol down to 1.04. Previous infarct felt to be embolic secondary to atrial fibrillation. Had been on coumadin in the past, now on hold d/t hemorrhage. Patient with resultant lethargy that seemed out of proportion to size of hemorrhage - EEG revealed non-convulsive status improved on Depacon, level 70.5 acceptable. Patient with resulting dysarthria, dysphagia and left hemiparesis. No change in neuro status.  RN noted labored respirations during the night 3/25 - increased rhonchi, wheezing and crackles, unproductive cough, given lasix. This was found to be stable during rounds by Dr. Tilden Dome. Again with respiratory distress during the night 3/26. Seen by Aram Beecham;  transferred back to the ICU. Dr. Leonie Man discussed with Dr. Alva Garnet who feels pt with developing pneumonia. Not in respiratory distress currently.  Atrial fibrillation - recurred in setting of TIA, placed on coumadin with plans for lifelong treatment, currently held 2/2 ICH Hypertension Hyperlipidemia, LDL 74, on lipitor 20 mg daily PTA, now on lipitor 20 md daily, goal LDL < 70 for diabetics Diabetes Type 2, HgbA1c 6.1, goal < 7.0 Decreased UOP, started on IVF, improved Hyponatremia, 135 Hx stroke - 2009 stroke and TIA, 11/2013 TIA w/ afib tachycardia  Neurosurgery consult Ronnald Ramp) seen and signed off.  Hospital day # 9  TREATMENT/PLAN  Will check repeat head CT to evaluate ICH evolution in light of continued lethargy  Minimally following commands, family reports increased interaction, no indication for repeat EEG at this time  Continue VPA 500mg  TID  Disposition: IP Rehab, they are following. May need PEG, discussed with hospitalist  UA on 03/24/2014 consistent with urinary tract infection - currently on Zosyn and vancomycin.  Jim Like, DO Neurology-Stroke  To contact Stroke Continuity provider, please refer to [email or web address] After hours, contact General Neurology

## 2014-03-27 NOTE — Progress Notes (Addendum)
ANTIBIOTIC CONSULT NOTE - INITIAL  Pharmacy Consult:  Zosyn Indication:  HCAP  Allergies  Allergen Reactions  . Crestor [Rosuvastatin]   . Latex Hives  . Wellbutrin [Bupropion]     Patient Measurements: Height: 5\' 5"  (165.1 cm) Weight: 154 lb 8.7 oz (70.1 kg) IBW/kg (Calculated) : 57  Vital Signs: Temp: 97.9 F (36.6 C) (03/29 0800) Temp src: Oral (03/29 0800) BP: 155/68 mmHg (03/29 0800) Pulse Rate: 88 (03/29 0800) Intake/Output from previous day: 03/28 0701 - 03/29 0700 In: 2401.2 [I.V.:356.2; NG/GT:1730; IV Piggyback:315] Out: 4081 [Urine:1225] Intake/Output from this shift:    Labs:  Recent Labs  03/25/14 0440 03/26/14 0320 03/27/14 0933  WBC  --  16.6*  --   HGB  --  11.9*  --   PLT  --  242  --   CREATININE 0.51 0.52 0.44*   Estimated Creatinine Clearance: 70.7 ml/min (by C-G formula based on Cr of 0.44). No results found for this basename: VANCOTROUGH, Corlis Leak, VANCORANDOM, [place], GENTPEAK, GENTRANDOM, TOBRATROUGH, TOBRAPEAK, TOBRARND, AMIKACINPEAK, AMIKACINTROU, AMIKACIN,  in the last 72 hours   Microbiology: Recent Results (from the past 720 hour(s))  MRSA PCR SCREENING     Status: None   Collection Time    03/18/14  5:38 PM      Result Value Ref Range Status   MRSA by PCR NEGATIVE  NEGATIVE Final   Comment:            The GeneXpert MRSA Assay (FDA     approved for NASAL specimens     only), is one component of a     comprehensive MRSA colonization     surveillance program. It is not     intended to diagnose MRSA     infection nor to guide or     monitor treatment for     MRSA infections.  MRSA PCR SCREENING     Status: None   Collection Time    03/24/14  9:06 AM      Result Value Ref Range Status   MRSA by PCR NEGATIVE  NEGATIVE Final   Comment:            The GeneXpert MRSA Assay (FDA     approved for NASAL specimens     only), is one component of a     comprehensive MRSA colonization     surveillance program. It is not   intended to diagnose MRSA     infection nor to guide or     monitor treatment for     MRSA infections.  CULTURE, BLOOD (ROUTINE X 2)     Status: None   Collection Time    03/24/14  1:00 PM      Result Value Ref Range Status   Specimen Description BLOOD RIGHT HAND   Final   Special Requests BOTTLES DRAWN AEROBIC ONLY 3CC   Final   Culture  Setup Time     Final   Value: 03/24/2014 17:01     Performed at [facility]   Culture     Final   Value:        BLOOD CULTURE RECEIVED NO GROWTH TO DATE CULTURE WILL BE HELD FOR 5 DAYS BEFORE ISSUING A FINAL NEGATIVE REPORT     Performed at [facility]   Report Status PENDING   Incomplete  CULTURE, BLOOD (ROUTINE X 2)     Status: None   Collection Time    03/24/14  1:15 PM  Result Value Ref Range Status   Specimen Description BLOOD RIGHT ARM   Final   Special Requests BOTTLES DRAWN AEROBIC ONLY 5CC   Final   Culture  Setup Time     Final   Value: 03/24/2014 17:01     Performed at [facility]   Culture     Final   Value:        BLOOD CULTURE RECEIVED NO GROWTH TO DATE CULTURE WILL BE HELD FOR 5 DAYS BEFORE ISSUING A FINAL NEGATIVE REPORT     Performed at [facility]   Report Status PENDING   Incomplete  URINE CULTURE     Status: None   Collection Time    03/24/14  1:50 PM      Result Value Ref Range Status   Specimen Description URINE, CATHETERIZED   Final   Special Requests NONE   Final   Culture  Setup Time     Final   Value: 03/24/2014 17:03     Performed at [place]     Final   Value: >=100,000 COLONIES/ML     Performed at [facility]   Culture     Final   Value: [place]     Performed at [facility]   Report Status PENDING   Incomplete   Organism ID, Bacteria ENTEROBACTER CLOACAE   Final    Medical History: Past Medical History  Diagnosis Date  . Hypertension   . Hiatal hernia   . Adenomatous polyp of colon   . Acute asthmatic  bronchitis   . GERD (gastroesophageal reflux disease)     EGD neg 03/07/2008, but prev required dilation  . COPD (chronic obstructive pulmonary disease)   . IBS (irritable bowel syndrome)     chronic  . Urinary incontinence   . Esophagitis   . Chronic gastritis   . Duodenitis without hemorrhage 10/01/2001  . Diverticulosis   . Personal history of colonic adenomas 02/05/2008        . Anemia   . Hyperlipidemia   . Decreased peripheral vision of right eye   . Pneumonia 2011  . Hyperthyroidism   . Type II diabetes mellitus   . Stroke     a. Hx of stroke and ministrokes ~ 2009. b. TIA 11/2013, dx with AF at that time.  . Arthritis   . Depression   . On home oxygen therapy     "2L; suppose to use 24/7" (01/05/2014)  . PAF (paroxysmal atrial fibrillation)     a. Prior hx. b. Recurred 11/2013 in setting of possible TIA. Lifelong Coumadin planned. Spont converted to NSR. EF 60-65% by echo 11/2013, normal cors 2007, normal nuc 2011.   . Atrial flutter     a. Dx 12/2013. s/p DCCV. On anticoagulation.      Assessment: 64 YOF admitted on 03/18/14 with left-sided weakness, found to have hemorrhagic conversion of ischemic stroke.  Pt has been on vanc/zosyn. Ucx has grown out enterobacter that is sens to cipro. Consider de-escalating to cipro to complete 7 days   Plan:   Cont zosyn 3.375g IV q8 Consider de-escalating zosyn to cipro

## 2014-03-27 NOTE — Progress Notes (Signed)
[facility] Progress Note [place] TEAM 1 - Stepdown/ICU TEAM   Colleen [name] JOI:[id number] DOB: 1951/03/05 DOA: 03/18/2014 PCP: [name], NP  Brief narrative: Colleen [name] is a 63 y.o. female presenting on 03/18/2014 with  has a past medical history of Hypertension; Hiatal hernia; Adenomatous polyp of colon; Acute asthmatic bronchitis; GERD; COPD; IBS  Esophagitis; Chronic gastritis; Duodenitis without hemorrhage (10/01/2001); Diverticulosis; colonic adenomas (02/05/2008); Anemia; Hyperlipidemia; Decreased peripheral vision of right eye;  Hyperthyroidism; Type II diabetes mellitus; Stroke; Arthritis; Depression; On home oxygen therapy; PAF (paroxysmal atrial fibrillation); and Atrial flutter who presents with increasing left sided weakness. She was admitted to [facility] for a right occipital infarct on 4/70 felt to be embolic from A-fib. CT also showed a petechial hemorrhage and her coumadin was discontinued. She is found here to have hemorrhagic conversion of her ischemic infarct. INR was 2.61 despite stopping coumadin. This was reversed with Vit K. RN noted labored respirations during the night 3/25 - increased rhonchi, wheezing and crackles, unproductive cough, given lasix. This was found to be stable during rounds by Dr. Tilden Dome. Again with respiratory distress during the night 3/26. Seen by Aram Beecham; transferred back to the ICU. Suspect by ICU team that pt was developing pneumonia. She was transferred out of the  ICU again and onto the Hospitalist service on 3/28 as she now has other acute medical issues in addition to her CVA.     Subjective: Quite sleepy but answers some questions- thinks she is home  Assessment/Plan: Principal Problem:   Acute hemorrhagic infarction of brain - per Neuro- repeated CT today- still showing vasogenic edema - CIR consulted per PT recommendations - still has NG and tube feeds and too sleepy for slp eval- may need a PEG  Active Problems:    Atrial  fibrillation - continues to have HR in 110-120 - on Cardizem and Lopressor- Add IV Lopressor but HR still rapid- will increase (oral) Cardizem and cont to follow - BP is high enough to tolerate this   HAP (hospital-acquired pneumonia) - RLL atelectasis vs infiltrate on cxr on 3/27 - on Vanc and Zosyn- still with low grade fevers  Sepsis with Enterobacter UTI - per UA on 3/26 - cont Zosyn- will need a 7 day course of antibiotics for this as it may be foley related    COPD- - on 2 L O2 at baseline- stable    Diabetes mellitus - stable on sensitive sliding scale    Diastolic dysfunction grade 2 by Echo 12/14 - compensated  Hypokalemia - replace     OBESITY  HLP - statin   Code Status: Full code Family Communication: none Disposition Plan: follow in SDU- evaluate for CIR  Consultants: Neuro  Procedures: 2D Echocardiogram Left ventricle: The cavity size was normal. Wall thickness was normal. Systolic function was normal. The estimated ejection fraction was in the range of 55% to 60%. Wall motion was normal; there were no regional wall motion abnormalities.  Carotid Doppler 03/18/2014 1-39% internal carotid artery stenosis bilaterally. Vertebral arteries are patent with antegrade flow. EEG  03/22/2014 This EEG is most consistent with a generalized nonspecific cerebral dysfunction. This EEG represents a marked improvement compared to the EEG of yesterday. There was no seizure recorded on this study.  03/21/2014 This recorded 2 likely epileptic seizures addition to a moderate to severe generalized nonspecific cerebral dysfunction. There was no seizure or seizure predisposition recorded on this study.   Antibiotics: Antibiotics Given (last 72 hours)   Date/Time  Action Medication Dose Rate   03/25/14 0038 Given   piperacillin-tazobactam (ZOSYN) IVPB 3.375 g 3.375 g 12.5 mL/hr   03/25/14 0144 Given   vancomycin (VANCOCIN) IVPB 750 mg/150 ml premix 750 mg 150 mL/hr   03/25/14  0935 Given   piperacillin-tazobactam (ZOSYN) IVPB 3.375 g 3.375 g 12.5 mL/hr   03/25/14 1634 Given   piperacillin-tazobactam (ZOSYN) IVPB 3.375 g 3.375 g 12.5 mL/hr   03/26/14 0155 Given   piperacillin-tazobactam (ZOSYN) IVPB 3.375 g 3.375 g 12.5 mL/hr   03/26/14 0901 Given   piperacillin-tazobactam (ZOSYN) IVPB 3.375 g 3.375 g 12.5 mL/hr   03/26/14 1728 Given   piperacillin-tazobactam (ZOSYN) IVPB 3.375 g 3.375 g 12.5 mL/hr   03/27/14 0100 Given   piperacillin-tazobactam (ZOSYN) IVPB 3.375 g 3.375 g 12.5 mL/hr   03/27/14 0852 Given   piperacillin-tazobactam (ZOSYN) IVPB 3.375 g 3.375 g 12.5 mL/hr   03/27/14 1732 Given   piperacillin-tazobactam (ZOSYN) IVPB 3.375 g 3.375 g 12.5 mL/hr       DVT prophylaxis: SCD  Objective: Filed Weights   03/25/14 1655 03/26/14 0525 03/27/14 0410  Weight: 71.1 kg (156 lb 12 oz) 69.9 kg (154 lb 1.6 oz) 70.1 kg (154 lb 8.7 oz)   Blood pressure 151/127, pulse 94, temperature 98.4 F (36.9 C), temperature source Axillary, resp. rate 33, height 5\' 5"  (1.651 m), weight 70.1 kg (154 lb 8.7 oz), SpO2 93.00%.  Intake/Output Summary (Last 24 hours) at 03/27/14 1857 Last data filed at 03/27/14 1800  Gross per 24 hour  Intake 2881.17 ml  Output    525 ml  Net 2356.17 ml     Exam: General: poorly communicative, confused, very somnolent  - No acute respiratory distress Lungs: Clear to auscultation bilaterally without wheezes or crackles- RR fluctuating from 110- 120 Cardiovascular: Regular rate and rhythm without murmur gallop or rub normal S1 and S2 Abdomen: Nontender, nondistended, soft, bowel sounds positive, no rebound, no ascites, no appreciable mass- NG tube present Extremities: No significant cyanosis, clubbing, or edema bilateral lower extremities  Data Reviewed: Basic Metabolic Panel:  Recent Labs Lab 03/25/14 0440 03/26/14 0320 03/27/14 0933  NA 140 138 135*  K 3.3* 3.1* 3.2*  CL 93* 93* 89*  CO2 31 31 36*  GLUCOSE 114* 120* 150*   BUN 16 19 19   CREATININE 0.51 0.52 0.44*  CALCIUM 9.5 9.2 9.0   Liver Function Tests: No results found for this basename: AST, ALT, ALKPHOS, BILITOT, PROT, ALBUMIN,  in the last 168 hours No results found for this basename: LIPASE, AMYLASE,  in the last 168 hours No results found for this basename: AMMONIA,  in the last 168 hours CBC:  Recent Labs Lab 03/26/14 0320  WBC 16.6*  HGB 11.9*  HCT 39.5  MCV 74.5*  PLT 242   Cardiac Enzymes: No results found for this basename: CKTOTAL, CKMB, CKMBINDEX, TROPONINI,  in the last 168 hours BNP (last 3 results)  Recent Labs  02/14/14 1729  PROBNP 6018.0*   CBG:  Recent Labs Lab 03/26/14 2059 03/27/14 0015 03/27/14 0408 03/27/14 0843 03/27/14 1722  GLUCAP 151* 128* 134* 139* 130*    Recent Results (from the past 240 hour(s))  MRSA PCR SCREENING     Status: None   Collection Time    03/18/14  5:38 PM      Result Value Ref Range Status   MRSA by PCR NEGATIVE  NEGATIVE Final   Comment:            The GeneXpert MRSA  Assay (FDA     approved for NASAL specimens     only), is one component of a     comprehensive MRSA colonization     surveillance program. It is not     intended to diagnose MRSA     infection nor to guide or     monitor treatment for     MRSA infections.  MRSA PCR SCREENING     Status: None   Collection Time    03/24/14  9:06 AM      Result Value Ref Range Status   MRSA by PCR NEGATIVE  NEGATIVE Final   Comment:            The GeneXpert MRSA Assay (FDA     approved for NASAL specimens     only), is one component of a     comprehensive MRSA colonization     surveillance program. It is not     intended to diagnose MRSA     infection nor to guide or     monitor treatment for     MRSA infections.  CULTURE, BLOOD (ROUTINE X 2)     Status: None   Collection Time    03/24/14  1:00 PM      Result Value Ref Range Status   Specimen Description BLOOD RIGHT HAND   Final   Special Requests BOTTLES DRAWN  AEROBIC ONLY 3CC   Final   Culture  Setup Time     Final   Value: 03/24/2014 17:01     Performed at [facility]   Culture     Final   Value:        BLOOD CULTURE RECEIVED NO GROWTH TO DATE CULTURE WILL BE HELD FOR 5 DAYS BEFORE ISSUING A FINAL NEGATIVE REPORT     Performed at [facility]   Report Status PENDING   Incomplete  CULTURE, BLOOD (ROUTINE X 2)     Status: None   Collection Time    03/24/14  1:15 PM      Result Value Ref Range Status   Specimen Description BLOOD RIGHT ARM   Final   Special Requests BOTTLES DRAWN AEROBIC ONLY 5CC   Final   Culture  Setup Time     Final   Value: 03/24/2014 17:01     Performed at [facility]   Culture     Final   Value:        BLOOD CULTURE RECEIVED NO GROWTH TO DATE CULTURE WILL BE HELD FOR 5 DAYS BEFORE ISSUING A FINAL NEGATIVE REPORT     Performed at [facility]   Report Status PENDING   Incomplete  URINE CULTURE     Status: None   Collection Time    03/24/14  1:50 PM      Result Value Ref Range Status   Specimen Description URINE, CATHETERIZED   Final   Special Requests NONE   Final   Culture  Setup Time     Final   Value: 03/24/2014 17:03     Performed at [place]     Final   Value: >=100,000 COLONIES/ML     Performed at [facility]   Culture     Final   Value: ENTEROBACTER CLOACAE     Performed at [facility]   Report Status 03/27/2014 FINAL   Final   Organism ID, Bacteria ENTEROBACTER CLOACAE   Final  Studies:  Recent x-ray studies have been reviewed in detail by the Attending Physician  Scheduled Meds:  Scheduled Meds: . albuterol  2.5 mg Nebulization Q6H  . antiseptic oral rinse  15 mL Mouth Rinse q12n4p  . atorvastatin  20 mg Per Tube QPM  . budesonide  0.25 mg Nebulization Q6H  . chlorhexidine  15 mL Mouth Rinse BID  . digoxin  0.125 mg Per NG tube Daily  . diltiazem  30 mg Per Tube 4 times per day  . feeding supplement (VITAL AF  1.2 CAL)  1,000 mL Per Tube Q24H  . insulin aspart  0-9 Units Subcutaneous 6 times per day  . methimazole  5 mg Per Tube Daily  . metoprolol  5 mg Intravenous Q4H  . metoprolol tartrate  50 mg Per Tube TID  . pantoprazole sodium  40 mg Per Tube Q1200  . piperacillin-tazobactam (ZOSYN)  IV  3.375 g Intravenous Q8H  . valproate sodium  500 mg Intravenous 3 times per day   Continuous Infusions: . sodium chloride 250 mL (03/26/14 2023)    Time spent on care of this patient: >35   Debbe Odea, MD  [facility] Office  [phone] Pager - Text Page per Shea Evans as per below:  On-Call/Text Page:      [email or web address]  If 7PM-7AM, please contact night-coverage [email or web address] 03/27/2014, 6:57 PM   LOS: 9 days

## 2014-03-28 ENCOUNTER — Inpatient Hospital Stay (HOSPITAL_COMMUNITY): Payer: Medicaid Other

## 2014-03-28 LAB — BASIC METABOLIC PANEL
BUN: 15 mg/dL (ref 6–23)
CHLORIDE: 92 meq/L — AB (ref 96–112)
CO2: 36 mEq/L — ABNORMAL HIGH (ref 19–32)
Calcium: 9.3 mg/dL (ref 8.4–10.5)
Creatinine, Ser: 0.4 mg/dL — ABNORMAL LOW (ref 0.50–1.10)
GFR calc Af Amer: >90 mL/min (ref >90)
GLUCOSE: 109 mg/dL — AB (ref 70–99)
Potassium: 3.9 mEq/L (ref 3.7–5.3)
Sodium: 140 mEq/L (ref 137–147)

## 2014-03-28 LAB — GLUCOSE, CAPILLARY
GLUCOSE-CAPILLARY: 107 mg/dL — AB (ref 70–99)
GLUCOSE-CAPILLARY: 148 mg/dL — AB (ref 70–99)
GLUCOSE-CAPILLARY: 146 mg/dL — AB (ref 70–99)
Glucose-Capillary: 102 mg/dL — ABNORMAL HIGH (ref 70–99)
Glucose-Capillary: 124 mg/dL — ABNORMAL HIGH (ref 70–99)
Glucose-Capillary: 133 mg/dL — ABNORMAL HIGH (ref 70–99)
Glucose-Capillary: 138 mg/dL — ABNORMAL HIGH (ref 70–99)

## 2014-03-28 MED ORDER — LEVALBUTEROL HCL 0.63 MG/3ML IN NEBU: 0.6300 mg | INHALATION_SOLUTION | RESPIRATORY_TRACT | Status: DC | PRN

## 2014-03-28 MED ORDER — METHIMAZOLE 5 MG PO TABS
5.0000 mg | ORAL_TABLET | Freq: Every day | ORAL | Status: DC
  Administered 2014-03-29: 5 mg via ORAL
  Filled 2014-03-28 (×2): qty 1

## 2014-03-28 MED ORDER — DIGOXIN 125 MCG PO TABS
0.1250 mg | ORAL_TABLET | Freq: Every day | ORAL | Status: DC
  Administered 2014-03-28 – 2014-03-29 (×2): 0.125 mg via ORAL
  Filled 2014-03-28 (×2): qty 1

## 2014-03-28 MED ORDER — DILTIAZEM 12 MG/ML ORAL SUSPENSION
90.0000 mg | Freq: Four times a day (QID) | ORAL | Status: DC
  Administered 2014-03-28 – 2014-03-29 (×4): 90 mg via ORAL
  Filled 2014-03-28 (×7): qty 9

## 2014-03-28 MED ORDER — ALPRAZOLAM 0.25 MG PO TABS
0.2500 mg | ORAL_TABLET | Freq: Every day | ORAL | Status: DC
  Administered 2014-03-28: 0.25 mg via ORAL
  Filled 2014-03-28: qty 1

## 2014-03-28 MED ORDER — INSULIN ASPART 100 UNIT/ML ~~LOC~~ SOLN: 0.0000 [IU] | Freq: Three times a day (TID) | SUBCUTANEOUS | Status: DC

## 2014-03-28 MED ORDER — ATORVASTATIN CALCIUM 20 MG PO TABS
20.0000 mg | ORAL_TABLET | Freq: Every evening | ORAL | Status: DC
  Administered 2014-03-28 – 2014-03-29 (×2): 20 mg via ORAL
  Filled 2014-03-28 (×2): qty 1

## 2014-03-28 MED ORDER — METOPROLOL TARTRATE 1 MG/ML IV SOLN: 5.0000 mg | INTRAVENOUS | Status: DC | PRN

## 2014-03-28 MED ORDER — METOPROLOL TARTRATE 100 MG PO TABS
100.0000 mg | ORAL_TABLET | Freq: Three times a day (TID) | ORAL | Status: DC
  Administered 2014-03-28 – 2014-03-29 (×3): 100 mg via ORAL
  Filled 2014-03-28 (×4): qty 1

## 2014-03-28 MED ORDER — ACETAMINOPHEN 650 MG RE SUPP: 650.0000 mg | RECTAL | Status: DC | PRN

## 2014-03-28 MED ORDER — ACETAMINOPHEN 325 MG PO TABS: 650.0000 mg | ORAL_TABLET | ORAL | Status: DC | PRN

## 2014-03-28 MED ORDER — BUDESONIDE 0.25 MG/2ML IN SUSP
0.2500 mg | Freq: Two times a day (BID) | RESPIRATORY_TRACT | Status: DC
  Administered 2014-03-29: 0.25 mg via RESPIRATORY_TRACT
  Filled 2014-03-28 (×3): qty 2

## 2014-03-28 NOTE — Progress Notes (Signed)
Clinical Social Work Department BRIEF PSYCHOSOCIAL ASSESSMENT 03/28/2014  Patient:  Colleen [name], Colleen [name]     Account Number:  [id number]     Admit date:  03/18/2014  Clinical Social Worker:  Freeman Caldron  Date/Time:  03/28/2014 11:35 AM  Referred by:  Physician  Date Referred:  03/28/2014 Referred for  SNF Placement   Other Referral:   Interview type:  Patient Other interview type:   Spoke with pt's daughter and granddaughter as well.    PSYCHOSOCIAL DATA Living Status:  FAMILY Admitted from facility:   Level of care:   Primary support name:  Sherlynn Carbon ([phone]) Primary support relationship to patient:  CHILD, ADULT Degree of support available:   Good--pt's daughter and granddaughter Sharyn Lull at bedside during assessment.    CURRENT CONCERNS Current Concerns  Post-Acute Placement   Other Concerns:    SOCIAL WORK ASSESSMENT / PLAN Pt hospitalized following stroke. Went to bedside to complete assessment, and pt has restraints on both hands. Daughter and granddaughter in room. Spoke with daughter and granddaughter while RN cleaned pt's bedsheets. Explained my role in care plan, and pt's daughter states pt lives in [place] which is in [place]. Provided daughter with list of SNFs and wrote my name and phone number at the top. I have made referrals to all SNFs in [place] and [facility], sending message to [place] that their facility is first choice. Pt is Medicaid only, and I mentioned to daughter that placement will depend on which facilities can take her and which have Medicaid bed available. [facility] with CIR, as they are also looking at pt. CIR pursuing placement for pt, but recommended I continue seeking SNF as back-up. Pt lives with husband and daughter and has ample family support.   Assessment/plan status:  Psychosocial Support/Ongoing Assessment of Needs Other assessment/ plan:   Information/referral to community resources:   SNF?  CIR?    PATIENT'S/FAMILY'S RESPONSE TO PLAN OF CARE: Good--pt spoke with me briefly before RN came to room to clean bedsheets. I asked where pt was living before she came to the hospital, and she stated an incorrect county. Pt's daughter provided information for assessment. Daughter thanked me for assistance, and family will be informed when facilities respond to bed request.       Ky Barban, MSW, [place] Social Worker [phone]

## 2014-03-28 NOTE — Progress Notes (Signed)
Pt just left floor to go have a barrium swallow test. Notified transporter that pt was not to be left alone. Notified central telemetry that pt leaving floor to have test.

## 2014-03-28 NOTE — PMR Pre-admission (Addendum)
PMR Admission Coordinator Pre-Admission Assessment  Patient: Colleen [name] is an 62 y.o., female MRN: [id number] DOB: [birth year] Height: 5\' 5"  (165.1 cm) Weight: 67.6 kg (149 lb 0.5 oz)              Insurance Information HMO:     PPO:      PCP:      IPA:      80/20:      OTHER:  PRIMARY: medicaid [place] Access      Policy#: [id number]      Subscriber: pt  Emergency Contact Information Contact Information   Name Relation Home Work Mobile   Galiano,Shannon Daughter [phone]     Taylor,Shelby Daughter [phone]  [phone]     Current Medical History  Patient Admitting Diagnosis: right temporal -occipital infarct with confusion, profound left visual spatial deficits, left HP  History of Present Illness: Colleen [name] is a 63 y.o. right-handed female with history of COPD, hypertension and atrial fibrillation with Coumadin therapy. Patient with recent right occipital infarct on Wednesday he 03/16/2014 was seen at [facility] following a CT showed petechial hemorrhage. She was discharged to home after stopping her Coumadin.   She presented to [facility] 03/18/2014 after developing severe headache with nausea vomiting and left-sided weakness. CT and imaging showed right temporal occipital region infarct with intraventricular extension into the right lateral ventricle with cerebral edema and left midline shift. She was not a TPA candidate. Noted INR on admission of 2.61 it was reversed. Followup cranial CT scan shows similar appearance of right temporoparietal 6.5 x 3.4 cm intraparenchymal hemorrhage no rebleed without hydrocephalus. 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 Followup neurology services maintained on valproate 500 mg every 8 hours. A Panda tube was placed for nutritional support.   RN noted labored respirations during the night 3/25 - increased rhonchi, wheezing and crackles, unproductive  cough, given lasix. This was found to be stable during rounds by Dr. Leonie Man. Again with respiratory distress during the night 3/26. Seen by Aram Beecham; transferred back to the ICU. Dr. Leonie Man discussed with Dr. Alva Garnet who feels pt with developing pneumonia. Per. Dr. Thereasa Solo on 03/29/14, HR better controlled and ready for CIR.   Total: 13 NIH    Past Medical History  Past Medical History  Diagnosis Date  . Hypertension   . Hiatal hernia   . Adenomatous polyp of colon   . Acute asthmatic bronchitis   . GERD (gastroesophageal reflux disease)     EGD neg 03/07/2008, but prev required dilation  . COPD (chronic obstructive pulmonary disease)   . IBS (irritable bowel syndrome)     chronic  . Urinary incontinence   . Esophagitis   . Chronic gastritis   . Duodenitis without hemorrhage 10/01/2001  . Diverticulosis   . Personal history of colonic adenomas 02/05/2008        . Anemia   . Hyperlipidemia   . Decreased peripheral vision of right eye   . Pneumonia 2011  . Hyperthyroidism   . Type II diabetes mellitus   . Stroke     a. Hx of stroke and ministrokes ~ 2009. b. TIA 11/2013, dx with AF at that time.  . Arthritis   . Depression   . On home oxygen therapy     "2L; suppose to use 24/7" (01/05/2014)  . PAF (paroxysmal atrial fibrillation)     a. Prior hx. b. Recurred 11/2013 in setting of possible TIA. Lifelong  Coumadin planned. Spont converted to NSR. EF 60-65% by echo 11/2013, normal cors 2007, normal nuc 2011.   . Atrial flutter     a. Dx 12/2013. s/p DCCV. On anticoagulation.    Family History  family history includes Asthma in her father and sister; Colon cancer in her father; Heart disease in her mother and sister; Lung cancer in her father.  Prior Rehab/Hospitalizations: home health only   Current Medications  Current facility-administered medications:acetaminophen (TYLENOL) suppository 650 mg, 650 mg, Rectal, Q4H PRN, Cherene Altes, MD;  acetaminophen (TYLENOL) tablet 650 mg,  650 mg, Oral, Q4H PRN, Cherene Altes, MD;  ALPRAZolam Duanne Moron) tablet 0.25 mg, 0.25 mg, Oral, QHS, Cherene Altes, MD, 0.25 mg at 03/28/14 2342;  antiseptic oral rinse (BIOTENE) solution 15 mL, 15 mL, Mouth Rinse, q12n4p, Garvin Fila, MD, 15 mL at 03/29/14 1200 atorvastatin (LIPITOR) tablet 20 mg, 20 mg, Oral, QPM, Cherene Altes, MD, 20 mg at 03/28/14 1836;  budesonide (PULMICORT) nebulizer solution 0.25 mg, 0.25 mg, Nebulization, BID, Cherene Altes, MD, 0.25 mg at 03/29/14 0919;  chlorhexidine (PERIDEX) 0.12 % solution 15 mL, 15 mL, Mouth Rinse, BID, Garvin Fila, MD, 15 mL at 03/29/14 5621 digoxin (LANOXIN) tablet 0.125 mg, 0.125 mg, Oral, Daily, Cherene Altes, MD, 0.125 mg at 03/29/14 1059;  diltiazem (CARDIZEM) 10 mg/ml oral suspension 90 mg, 90 mg, Oral, 4 times per day, Cherene Altes, MD, 90 mg at 03/29/14 3086;  feeding supplement (ENSURE) (ENSURE) pudding 1 Container, 1 Container, Oral, TID BM, Rogue Bussing, RD, 1 Container at 03/29/14 1400;  food thickener (THICK IT) powder, , Oral, PRN, Donzetta Starch, NP insulin aspart (novoLOG) injection 0-9 Units, 0-9 Units, Subcutaneous, TID WC, Cherene Altes, MD;  levalbuterol Penne Lash) nebulizer solution 0.63 mg, 0.63 mg, Nebulization, Q3H PRN, Cherene Altes, MD;  methimazole (TAPAZOLE) tablet 5 mg, 5 mg, Oral, Daily, Cherene Altes, MD, 5 mg at 03/29/14 1101;  metoprolol (LOPRESSOR) injection 5 mg, 5 mg, Intravenous, Q4H PRN, Cherene Altes, MD metoprolol (LOPRESSOR) tablet 100 mg, 100 mg, Oral, TID, Cherene Altes, MD, 100 mg at 03/29/14 1059;  piperacillin-tazobactam (ZOSYN) IVPB 3.375 g, 3.375 g, Intravenous, Q8H, Thuy Dien Dang, RPH, 3.375 g at 03/29/14 0915;  potassium chloride SA (K-DUR,KLOR-CON) CR tablet 40 mEq, 40 mEq, Oral, BID, Cherene Altes, MD, 40 mEq at 03/29/14 1330;  Valproic Acid (DEPAKENE) 250 MG/5ML syrup SYRP 500 mg, 500 mg, Oral, TID, Cherene Altes, MD  Patients Current Diet:  Dysphagia 1  Diet with honey thick liquids 03/28/14  Precautions / Restrictions Precautions Precautions: Fall Precautions/Special Needs: Swallowing Precaution Comments: inattention to left. Restrictions Weight Bearing Restrictions: No   Prior Activity Level Limited Community (1-2x/wk): daughter reports pt. out of home mostly to MD appointments; she does not enjoy shopping or being away from home  [place] / [place] Devices/Equipment: None  Prior Functional Level Prior Function Level of Independence: Independent Comments: per. daughter, 4 Lo2 intermittently at home  Current Functional Level Cognition  Arousal/Alertness: Lethargic Overall Cognitive Status: Impaired/Different from baseline Current Attention Level: Focused Orientation Level: Disoriented X4 Following Commands: Follows one step commands inconsistently Safety/Judgement: Decreased awareness of safety;Decreased awareness of deficits General Comments: confabulating Attention: Focused Focused Attention: Impaired Focused Attention Impairment: Verbal basic Memory: Impaired Awareness: Impaired Awareness Impairment: Intellectual impairment;Emergent impairment;Anticipatory impairment Problem Solving: Impaired Problem Solving Impairment: Verbal basic Executive Function: Self Correcting;Self Monitoring;Decision Making;Organizing Behaviors: Restless Safety/Judgment: Impaired  Extremity Assessment (includes Sensation/Coordination)          ADLs  Overall ADL's : Needs assistance/impaired General ADL Comments: The intent was to have pt sit up on EOB and work on grooming and/or visual tasks. However upon sitting pt up her balance was too varied to attempt either without +2 A (which I did not currently have). So, I just had her work on sitting balance with me and midline posture including head. See balance section for more details    Mobility  Overal bed mobility: Needs Assistance Bed  Mobility: Rolling;Sidelying to Sit;Sit to Supine Rolling: Total assist Sidelying to sit: Total assist Supine to sit: Max assist;+2 for physical assistance Sit to supine: Total assist General bed mobility comments: tactile cues needed in addition to verbal to get any initiation.  Needed assist for LEs and trunk elevation.      Transfers  Overall transfer level: Needs assistance Equipment used: 2 person hand held assist Transfers: Sit to/from [facility] Sit to Stand: Mod assist;+2 physical assistance;Max assist Stand pivot transfers: Mod assist;Max assist;+2 physical assistance General transfer comment: Did not attempt due to being unsafe with +1 A    Ambulation / Gait / Stairs / Office manager / Balance Dynamic Sitting Balance Sitting balance - Comments: Pt's EOB balance varied from min guard A to total A with tendency to lean right and posteriorly. With verbal and tactile cues she did not attempt to correct, however would self correct at times (but not maintain). Only got her to have her head at midline with eyes at midline x1 (never saw her eyes go past midline to the left)    Special needs/care consideration Continuous Drip IV yes, 10-20 ml/hr KVO Oxygen yes, currently on 2L/min nasal cannula Bowel mgmt:  incontinent Bladder mgmt: incontinent Diabetic mgmt Hgb A1c 6.1   Previous Home Environment Living Arrangements: Children;Spouse/significant other  Lives With: Spouse;Daughter;Other (Comment) (spouse , daughter, Wilburn Cornelia, and 23 yo grand daughter) Available Help at Discharge: Family;Available 24 hours/day Type of Home: House Home Layout: One level Home Access: Stairs to enter Entrance Stairs-Rails: None Entrance Stairs-Number of Steps: 4 Bathroom Shower/Tub: Multimedia programmer: Handicapped height Home Care Services: No Additional Comments: pt extremely poor historian, unable to answer questions. Per pt with spouse and  children  Discharge Living Setting Plans for Discharge Living Setting: Patient's home;Lives with (comment);Other (Comment) (spouse, daughter, and grand daughter) Type of Home at Discharge: House Discharge Home Layout: One level Discharge Home Access: Stairs to enter Entrance Stairs-Rails: None Entrance Stairs-Number of Steps: 4 Discharge Bathroom Shower/Tub: Walk-in shower Discharge Bathroom Toilet: Handicapped height Discharge Bathroom Accessibility: Yes How Accessible: Accessible via walker Does the patient have any problems obtaining your medications?: No  Social/Family/Support Systems Patient Roles: Spouse;Parent Contact Information: [place], daughter, and spouse, Marcello Moores Anticipated Caregiver: Margot Chimes, and grand daughter Anticipated Caregiver's Contact Information: see above Ability/Limitations of Caregiver: [facility] with back issues Caregiver Availability: 24/7  Wilburn Cornelia reports pt. Receives CAP[facility] worker M-F 8am to 2:30 pm. Discharge Plan Discussed with Primary Caregiver: Yes Is Caregiver In Agreement with Plan?: Yes Does Caregiver/Family have Issues with Lodging/Transportation while Pt is in Rehab?: No  Goals/Additional Needs Patient/Family Goal for Rehab: supervision to min assist with PT, OT, and SLP Expected length of stay: ELOS 20-27 days Pt/Family Agrees to Admission and willing to participate: Yes Program Orientation Provided & Reviewed with Pt/Caregiver Including Roles  & Responsibilities: Yes   Decrease burden of Care through  IP rehab admission: no  Possible need for SNF placement upon discharge:not anticipated  Patient Condition: This patient's medical and functional status has changed since the consult dated: 03/22/14 in which the Rehabilitation Physician determined and documented that the patient's condition is appropriate for intensive rehabilitative care in an inpatient rehabilitation facility. See "History of Present Illness" (above) for medical update.  Functional changes are:  With OT, pt. At min guard to total assist for sitting balance (fluctuating) ,  +2 mod to max for transfers with PT, SLP with D-1 diet and honey thick liquids. Patient's medical and functional status update has been discussed with the Rehabilitation physician and patient remains appropriate for inpatient rehabilitation. Will admit to inpatient rehab today.  Preadmission Screen Completed By:  Tressie Stalker , 03/29/2014 2:51 PM ______________________________________________________________________   Discussed status with Dr. Naaman Plummer on 03/29/14 at  1500  and received telephone approval for admission today.  Admission Coordinator:  Gerlean Ren, PT time 1500 Sudie Grumbling 03/29/14

## 2014-03-28 NOTE — Progress Notes (Signed)
Stroke Team Progress Note  HISTORY 63 y.o. female with a history of recent right occipital infarct on Wednesday 03/16/2014 who was seen at [facility] following a fall where a CT showed pethechial hemorrhage, she was d/c home after stopping her coumadin which she takes for atrial fibrillation. On 03/18/2014 she developed severe headache with nausea and vomiting and was brought to the ED where she had left-sided weakness and was found to have right temporal-occipital region with intraventricular extension into the right lateral ventricle. She was not a TPA candidate secondary to stroke with in the past 90 days and hemorrhage. Neurosurgeon consulted (Dr. Ronnald Ramp) felt no surgical intervention was necessary. She was admitted to the neuro ICU for further treatment and evaluation.  SUBJECTIVE Pulled out panda during the night  OBJECTIVE Most recent Vital Signs: Filed Vitals:   03/28/14 0700 03/28/14 0800 03/28/14 0825 03/28/14 0911  BP:  176/90  189/79  Pulse: 99 90  99  Temp:    98.6 F (37 C)  TempSrc:    Axillary  Resp: 34 31  28  Height:      Weight:      SpO2: 92% 93% 94% 91%   CBG (last 3)   Recent Labs  03/28/14 0020 03/28/14 0421 03/28/14 0755  GLUCAP 102* 107* 124*    IV Fluid Intake:   . sodium chloride 250 mL (03/26/14 2023)    MEDICATIONS  . albuterol  2.5 mg Nebulization Q6H  . antiseptic oral rinse  15 mL Mouth Rinse q12n4p  . atorvastatin  20 mg Per Tube QPM  . budesonide  0.25 mg Nebulization Q6H  . chlorhexidine  15 mL Mouth Rinse BID  . digoxin  0.125 mg Per NG tube Daily  . diltiazem  60 mg Per Tube 4 times per day  . feeding supplement (VITAL AF 1.2 CAL)  1,000 mL Per Tube Q24H  . insulin aspart  0-9 Units Subcutaneous 6 times per day  . methimazole  5 mg Per Tube Daily  . metoprolol  5 mg Intravenous Q4H  . metoprolol tartrate  50 mg Per Tube TID  . pantoprazole sodium  40 mg Per Tube Q1200  . piperacillin-tazobactam (ZOSYN)  IV  3.375 g Intravenous  Q8H  . valproate sodium  500 mg Intravenous 3 times per day   PRN:  acetaminophen, acetaminophen, albuterol, food thickener, ipratropium  Diet:     Activity:  Up with assistance DVT Prophylaxis:  SCDs  CLINICALLY SIGNIFICANT STUDIES Basic Metabolic Panel:   Recent Labs Lab 03/27/14 0933 03/28/14 0334  NA 135* 140  K 3.2* 3.9  CL 89* 92*  CO2 36* 36*  GLUCOSE 150* 109*  BUN 19 15  CREATININE 0.44* 0.40*  CALCIUM 9.0 9.3   Liver Function Tests:  No results found for this basename: AST, ALT, ALKPHOS, BILITOT, PROT, ALBUMIN,  in the last 168 hours CBC:   Recent Labs Lab 03/26/14 0320  WBC 16.6*  HGB 11.9*  HCT 39.5  MCV 74.5*  PLT 242   Coagulation:  No results found for this basename: LABPROT, INR,  in the last 168 hours Cardiac Enzymes: No results found for this basename: CKTOTAL, CKMB, CKMBINDEX, TROPONINI,  in the last 168 hours Urinalysis:   Recent Labs Lab 03/24/14 1350  COLORURINE AMBER*  LABSPEC 1.023  PHURINE 7.0  GLUCOSEU NEGATIVE  HGBUR SMALL*  BILIRUBINUR SMALL*  KETONESUR 40*  PROTEINUR 100*  UROBILINOGEN 4.0*  NITRITE POSITIVE*  LEUKOCYTESUR LARGE*   Lipid Panel  Component Value Date/Time   CHOL 159 03/19/2014 0550   TRIG 134 03/19/2014 0550   HDL 58 03/19/2014 0550   CHOLHDL 2.7 03/19/2014 0550   VLDL 27 03/19/2014 0550   LDLCALC 74 03/19/2014 0550   HgbA1C  Lab Results  Component Value Date   HGBA1C 6.1* 03/19/2014    Urine Drug Screen:     Component Value Date/Time   LABOPIA POSITIVE* 12/07/2013 2131   COCAINSCRNUR NONE DETECTED 12/07/2013 2131   LABBENZ NONE DETECTED 12/07/2013 2131   AMPHETMU NONE DETECTED 12/07/2013 2131   THCU NONE DETECTED 12/07/2013 2131   LABBARB NONE DETECTED 12/07/2013 2131    Alcohol Level: No results found for this basename: ETH,  in the last 168 hours   CT of the brain   03/22/2014 Similar appearance of right temporal parietal 6.5 x 3.4 cm intraparenchymal hemorrhage with intraventricular extension,  similar right left midline shift. No rebleed. No hydrocephalus.  03/20/2014    Stable parenchymal hematoma in the right temporo-occipital region, as described above.  Mild surrounding vasogenic edema, extension into the left lateral ventricle, and 4 mm leftward midline shift, grossly unchanged.  Stable cortical effacement/loss of sulcation involving the right cerebral hemisphere, indicating cerebral edema.    03/19/2014    1. No significant interval change in intra-axial hematoma involving the right temporal-occipital region with intraventricular extension into the right lateral ventricle. The intraparenchymal hematoma measures 6.4 x 3.7 cm on today's exam, previously 6.4 x 3.7 cm on 03/18/2014. 2. Similar appearance of diffuse edema involving the right cerebral hemisphere. Similar leftward midline shift of 5 mm. 3. Similar enlargement of the temporal horns of the lateral ventricles bilaterally. No hydrocephalus. Basilar cisterns remain patent. 4. No new intracranial hemorrhage or infarct identified.    03/18/2014   1. 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 Intra-axial hematoma with estimated volume of 53 mm, plus secondary extension of hemorrhage into the right lateral ventricle. 2. Generalized right hemisphere edema. Leftward midline shift of 5 mm. 3. Mild enlargement of the temporal horns.   2D Echocardiogram  Left ventricle: The cavity size was normal. Wall thickness was normal. Systolic function was normal. The estimated ejection fraction was in the range of 55% to 60%. Wall motion was normal; there were no regional wall motion abnormalities.  Carotid Doppler  03/18/2014 1-39% internal carotid artery stenosis bilaterally. Vertebral arteries are patent with antegrade flow.  CXR   03/24/2014 Basilar infiltrates versus atelectasis. Further evaluation with PA  and lateral chest radiograph recommended all. Feeding tube appreciated tip  projecting in the fundal region of the stomach.  03/18/2014   No edema or consolidation.     EEG 03/22/2014 This EEG is most consistent with a generalized nonspecific cerebral dysfunction. This EEG represents a marked improvement compared to the EEG of yesterday. There was no seizure recorded on this study.  03/21/2014 This recorded 2 likely epileptic seizures addition to a moderate to severe generalized nonspecific cerebral dysfunction. There was no seizure or seizure predisposition recorded on this study.   EKG  A-fib.   Therapy Recommendations CIR  Physical Exam  General: in bed  Resp: non-laboured breathing  CV: Irregular  Abd: NT, ND  Ext: no edema  Mental Status:  Patient is drowsy and can easily be aroused but not following commands well. Will blink eyes and occasionally move right side to command   Cranial Nerves:  II: L hemianopia. Pupils are equal, round, and reactive to light.  III,IV, VI: right gaze deviation VII: Facial movement is decreased on left  VIII: hearing is intact to voice  X: Uvula elevates symmetrically  XII: tongue is midline without atrophy or fasciculations.  Motor:  Tone is normal. Bulk is normal. Moves right side against gravity and light resistance, not noted to move left side against gravity but exam limited due to mental status Sensory:  Decreased withdrawal to noxious stimuli on the left Deep Tendon Reflexes:  2+ and symmetric in the biceps and patellae.  Cerebellar:  UTO Gait:  Not assessed    ASSESSMENT Colleen [name] is a 63 y.o. female with hemorrhagic conversion of an ischemic infarct that occurred 03/16/2014. Imaging confirms a right temporo-occipital region with intraventricular extension and cerebral edema with leftward midline shift. She had been on warfarin (now on hold), INR on arrival is 2.61. It was reversed with anticoagulation reversal protocol down to 1.04. Previous infarct felt to be embolic secondary to atrial fibrillation. Had  been on coumadin in the past, now on hold d/t hemorrhage. Patient with resultant lethargy that seemed out of proportion to size of hemorrhage - EEG revealed non-convulsive status improved on Depacon, level 70.5 acceptable. Patient with resulting dysarthria, dysphagia and left hemiparesis. No change in neuro status.  RN noted labored respirations during the night 3/25 - increased rhonchi, wheezing and crackles, unproductive cough, given lasix. This was found to be stable during rounds by Dr. Tilden Dome. Again with respiratory distress during the night 3/26. Seen by Aram Beecham;  transferred back to the ICU. Dr. Leonie Man discussed with Dr. Alva Garnet who feels pt with developing pneumonia. Not in respiratory distress currently.  Atrial fibrillation - recurred in setting of TIA, placed on coumadin with plans for lifelong treatment, currently held 2/2 d/t ICH. On cardizem and lopressort Hypertension Hyperlipidemia, LDL 74, on lipitor 20 mg daily PTA, now on lipitor 20 md daily, goal LDL < 70 for diabetics Diabetes Type 2, HgbA1c 6.1, goal < 7.0 Decreased UOP, started on IVF, improved Hyponatremia, 135 Hx stroke - 2009 stroke and TIA, 11/2013 [facility] w/ [facility]ib [facility]  Sepsis with enterobacter UTI - txd w/ Zosyn and vancomycin.  Neurosurgery consult Ronnald Ramp) seen and signed off.  Hospital day # 10  TREATMENT/PLAN  ST to reassess swallow prior to reinserting panda, tube feeds  Continue VPA 500mg  TID  Disposition: IP Rehab, they are following.   Burnetta Sabin, MSN, RN, ANVP-BC, ANP-BC, GNP-BC [facility] Pager: [phone] 03/28/2014 3:00 PM  I have personally obtained a history, examined the patient, evaluated imaging results, and formulated the assessment and plan of care. I agree with the above.  Antony Contras, MD  To contact Stroke Continuity provider, please refer to [email or web address] After hours, contact General Neurology

## 2014-03-28 NOTE — Progress Notes (Signed)
Progress Note [place] TEAM 1 - Stepdown/ICU TEAM   Colleen [name] BMW:[id number] DOB: 04-Jul-1951 DOA: 03/18/2014 PCP: [name], NP  Brief narrative: 63 y.o. female who presented on 03/18/2014 with a history of Hypertension; Hiatal hernia; Adenomatous polyp of colon; GERD; COPD; IBS  Esophagitis; Chronic gastritis; Duodenitis without hemorrhage; Diverticulosis; Anemia; Hyperlipidemia;  Hyperthyroidism; Type II diabetes mellitus; prior Stroke; Arthritis; Depression; PAF and Atrial flutter who c/o with increasing left sided weakness. She was admitted to [facility] for a right occipital infarct on 2/72 felt to be embolic from A-fib. CT also showed a petechial hemorrhage and her coumadin was discontinued. She was found to have hemorrhagic conversion of her recent ischemic infarct. INR was 2.61 despite stopping coumadin. This was reversed with Vit K.  RN noted labored respirations during the night 3/25 - increased rhonchi, wheezing and crackles, unproductive cough, given lasix. This was found to be stable during rounds by Dr. Leonie Man. Again with respiratory distress during the night 3/26. Seen by Aram Beecham; transferred back to the ICU. Suspect by ICU Team that pt was developing pneumonia.  She was transferred out of the ICU again and onto the Hospitalist service on 3/28.   SIGNIFICANT EVENTS / STUDIES:  3/18 - ischemic right occipital CVA (seen at [facility])  3/20 - admitted to [facility] w/ hemorrhagic conversion at the right temporal-occipital region with intraventricular extension into the right lateral ventricle complicated by questionable seizures 3/26 - transferred to MICU for respiratory distress  Subjective: Lethargic.  Will not awaken to interact w/ MD.    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 - per Neuro - CIR consulted per PT recommendations - slow to improve globally   Dysphagia due to CVA - now cleared for dysphagia diet per SLP - begin and follow intake     Atrial fibrillation - rate control remains difficult - adjust medical tx and follow   HCAP (hospital-acquired pneumonia) - RLL atelectasis vs infiltrate on cxr on 3/27 - on Zosyn empirically   Enterobacter UTI w/ sepsis  - per UA on 3/26 - cont Zosyn - will need a 7 day course of antibiotics for this as it may be foley related - sepsis physiology resolved   COPD - on 2 L O2 at baseline - stable  Diabetes mellitus - stable on sensitive sliding scale  Diastolic dysfunction grade 2 by Echo 12/14 - compensated  Hypokalemia - replaced   HLP - statin  Hyponatremia - resolved   Code Status: FULL Family Communication: spoke w/ husband at bedside  Disposition Plan: follow in SDU until HR better controlled - evaluate for CIR  Consultants: Neuro PCCM >> TRH  Procedures: 2D Echocardiogram Left ventricle: The cavity size was normal. Wall thickness was normal. Systolic function was normal. The estimated ejection fraction was in the range of 55% to 60%. Wall motion was normal; there were no regional wall motion abnormalities.  Carotid Doppler 03/18/2014 1-39% internal carotid artery stenosis bilaterally. Vertebral arteries are patent with antegrade flow. EEG 03/22/2014 - most consistent with a generalized nonspecific cerebral dysfunction. There was no seizure recorded on this study.  EEG 03/21/2014 This recorded 2 likely epileptic seizures addition to a moderate to severe generalized nonspecific cerebral dysfunction. There was no seizure or seizure predisposition recorded on this study.   Antibiotics: Vanc 3/26 Zosyn 3/26 >>>  DVT prophylaxis: SCD  Objective: Filed Weights   03/26/14 0525 03/27/14 0410 03/28/14 0419  Weight: 69.9 kg (154 lb 1.6 oz) 70.1 kg (154 lb  8.7 oz) 67.6 kg (149 lb 0.5 oz)   Blood pressure 190/98, pulse 122, temperature 98.6 F (37 C), temperature source Axillary, resp. rate 28, height 5\' 5"  (1.651 m), weight 67.6 kg (149 lb 0.5 oz), SpO2  97.00%.  Intake/Output Summary (Last 24 hours) at 03/28/14 1538 Last data filed at 03/28/14 0500  Gross per 24 hour  Intake   1650 ml  Output      0 ml  Net   1650 ml   Exam: General: poorly communicative - very somnolent  - No acute respiratory distress Lungs: Clear to auscultation bilaterally without wheezes or crackles Cardiovascular: tachycardic without murmur gallop or rub  Abdomen: Nontender, nondistended, soft, bowel sounds positive, no rebound, no ascites, no appreciable mass Extremities: No significant cyanosis, clubbing, or edema bilateral lower extremities  Data Reviewed: Basic Metabolic Panel:  Recent Labs Lab 03/25/14 0440 03/26/14 0320 03/27/14 0933 03/28/14 0334  NA 140 138 135* 140  K 3.3* 3.1* 3.2* 3.9  CL 93* 93* 89* 92*  CO2 31 31 36* 36*  GLUCOSE 114* 120* 150* 109*  BUN 16 19 19 15   CREATININE 0.51 0.52 0.44* 0.40*  CALCIUM 9.5 9.2 9.0 9.3   Liver Function Tests: No results found for this basename: AST, ALT, ALKPHOS, BILITOT, PROT, ALBUMIN,  in the last 168 hours  CBC:  Recent Labs Lab 03/26/14 0320  WBC 16.6*  HGB 11.9*  HCT 39.5  MCV 74.5*  PLT 242   CBG:  Recent Labs Lab 03/27/14 2054 03/28/14 0020 03/28/14 0421 03/28/14 0755 03/28/14 1226  GLUCAP 131* 102* 107* 124* 148*    Recent Results (from the past 240 hour(s))  MRSA PCR SCREENING     Status: None   Collection Time    03/18/14  5:38 PM      Result Value Ref Range Status   MRSA by PCR NEGATIVE  NEGATIVE Final   Comment:            The GeneXpert MRSA Assay (FDA     approved for NASAL specimens     only), is one component of a     comprehensive MRSA colonization     surveillance program. It is not     intended to diagnose MRSA     infection nor to guide or     monitor treatment for     MRSA infections.  MRSA PCR SCREENING     Status: None   Collection Time    03/24/14  9:06 AM      Result Value Ref Range Status   MRSA by PCR NEGATIVE  NEGATIVE Final   Comment:             The GeneXpert MRSA Assay (FDA     approved for NASAL specimens     only), is one component of a     comprehensive MRSA colonization     surveillance program. It is not     intended to diagnose MRSA     infection nor to guide or     monitor treatment for     MRSA infections.  CULTURE, BLOOD (ROUTINE X 2)     Status: None   Collection Time    03/24/14  1:00 PM      Result Value Ref Range Status   Specimen Description BLOOD RIGHT HAND   Final   Special Requests BOTTLES DRAWN AEROBIC ONLY 3CC   Final   Culture  Setup Time     Final   Value:  03/24/2014 17:01     Performed at [facility]   Culture     Final   Value:        BLOOD CULTURE RECEIVED NO GROWTH TO DATE CULTURE WILL BE HELD FOR 5 DAYS BEFORE ISSUING A FINAL NEGATIVE REPORT     Performed at [facility]   Report Status PENDING   Incomplete  CULTURE, BLOOD (ROUTINE X 2)     Status: None   Collection Time    03/24/14  1:15 PM      Result Value Ref Range Status   Specimen Description BLOOD RIGHT ARM   Final   Special Requests BOTTLES DRAWN AEROBIC ONLY 5CC   Final   Culture  Setup Time     Final   Value: 03/24/2014 17:01     Performed at [facility]   Culture     Final   Value:        BLOOD CULTURE RECEIVED NO GROWTH TO DATE CULTURE WILL BE HELD FOR 5 DAYS BEFORE ISSUING A FINAL NEGATIVE REPORT     Performed at [facility]   Report Status PENDING   Incomplete  URINE CULTURE     Status: None   Collection Time    03/24/14  1:50 PM      Result Value Ref Range Status   Specimen Description URINE, CATHETERIZED   Final   Special Requests NONE   Final   Culture  Setup Time     Final   Value: 03/24/2014 17:03     Performed at [place]     Final   Value: >=100,000 COLONIES/ML     Performed at [facility]   Culture     Final   Value: ENTEROBACTER CLOACAE     Performed at [facility]   Report Status 03/27/2014 FINAL   Final   Organism  ID, Bacteria ENTEROBACTER CLOACAE   Final     Studies:  Recent x-ray studies have been reviewed in detail by the Attending Physician  Scheduled Meds:  Scheduled Meds: . albuterol  2.5 mg Nebulization Q6H  . antiseptic oral rinse  15 mL Mouth Rinse q12n4p  . atorvastatin  20 mg Per Tube QPM  . budesonide  0.25 mg Nebulization Q6H  . chlorhexidine  15 mL Mouth Rinse BID  . digoxin  0.125 mg Per NG tube Daily  . diltiazem  60 mg Per Tube 4 times per day  . feeding supplement (VITAL AF 1.2 CAL)  1,000 mL Per Tube Q24H  . insulin aspart  0-9 Units Subcutaneous 6 times per day  . methimazole  5 mg Per Tube Daily  . metoprolol  5 mg Intravenous Q4H  . metoprolol tartrate  50 mg Per Tube TID  . pantoprazole sodium  40 mg Per Tube Q1200  . piperacillin-tazobactam (ZOSYN)  IV  3.375 g Intravenous Q8H  . valproate sodium  500 mg Intravenous 3 times per day   Time spent on care of this patient: 35 mins   Lowella Kindley T, MD  [facility] Office  [phone] Pager - Text Page per Shea Evans as per below:  On-Call/Text Page:      [email or web address]  If 7PM-7AM, please contact night-coverage [email or web address] 03/28/2014, 3:38 PM   LOS: 10 days

## 2014-03-28 NOTE — Progress Notes (Signed)
Physical Therapy Treatment Patient Details Name: Colleen [name] MRN: [id number] DOB: [birth year] Today's Date: 03/28/2014    History of Present Illness Colleen [name] is a 63 y.o. female with a history of recent stroke on Wednesday who was discharged from [facility] after having coumadin stopped for some pethechial hemorrhage associated with the hemorrhage. Pt with R temporo-occipital hemorrhage    PT Comments    Pt admitted with above. Pt currently with functional limitations due to balance and endurance deficits as well as cognitive deficits.  Pt will benefit from skilled PT to increase their independence and safety with mobility to allow discharge to the venue listed below.   Follow Up Recommendations  CIR     Equipment Recommendations  Other (comment) (TBA)    Recommendations for Other Services Rehab consult     Precautions / Restrictions Precautions Precautions: Fall Precaution Comments: inattention to left. Restrictions Weight Bearing Restrictions: No    Mobility  Bed Mobility Overal bed mobility: Needs Assistance Bed Mobility: Rolling;Sidelying to Sit;Sit to Supine Rolling: Max assist;+2 for physical assistance Sidelying to sit: Max assist;+2 for physical assistance Supine to sit: Max assist;+2 for physical assistance     General bed mobility comments: tactile cues needed in addition to verbal to get any initiation.  Needed assist for LEs and trunk elevation.    Transfers Overall transfer level: Needs assistance Equipment used: 2 person hand held assist Transfers: Sit to/from [facility] Sit to Stand: Mod assist;+2 physical assistance;Max assist Stand pivot transfers: Mod assist;Max assist;+2 physical assistance       General transfer comment: Required truncal assist for stability, but no knee or LE blocking or support needed.  Pt stood and pivoted with pt initiating movement of LEs for the pivot.  Had some difficulty getting pt to sit.  Tends  to extend entire body when cued to assist.    Ambulation/Gait                 Stairs            Wheelchair Mobility    Modified Rankin (Stroke Patients Only) Modified Rankin (Stroke Patients Only) Pre-Morbid Rankin Score: No symptoms Modified Rankin: Severe disability     Balance Overall balance assessment: Needs assistance;History of Falls Sitting-balance support: Single extremity supported;Feet supported Sitting balance-Leahy Scale: Zero Sitting balance - Comments: Pt needed total assist throughout as she had a posterior lean and was too inattentive to assist with sitting EOB appropriately and without assist.  Pt scooted herself to the EOB with her trunk and LE extension as it was so significant.   Postural control: Posterior lean Standing balance support: Bilateral upper extremity supported;During functional activity Standing balance-Leahy Scale: Zero Standing balance comment: Pt initially stands upright but quickly thrusts herself posteriorly needing assist for postural stability.  Extensor tone limits safety with transitional movements.                      Cognition Arousal/Alertness: Awake/alert Behavior During Therapy: Flat affect;Restless Overall Cognitive Status: Impaired/Different from baseline Area of Impairment: Orientation;Attention;Memory;Following commands;Safety/judgement;Awareness;Problem solving Orientation Level: Disoriented to;Place;Time;Situation Current Attention Level: Focused Memory: Decreased recall of precautions;Decreased short-term memory Following Commands: Follows one step commands inconsistentlyPoor ability to follow commands making session difficult.  Delayed following of commands as well.   Safety/Judgement: Decreased awareness of safety;Decreased awareness of deficits   Problem Solving: Slow processing;Decreased initiation;Difficulty sequencing;Requires verbal cues;Requires tactile cues      Exercises      General  Comments        Pertinent Vitals/Pain VSS with pt in afib, sats >94% on 2LO2, no pain    Home Living                      Prior Function            PT Goals (current goals can now be found in the care plan section) Progress towards PT goals: Progressing toward goals    Frequency  Min 3X/week    PT Plan Current plan remains appropriate    End of Session Equipment Utilized During Treatment: Gait belt;Oxygen Activity Tolerance: Patient limited by fatigue Patient left: in chair;with call bell/phone within reach;with family/visitor present;with chair alarm set     Time: 4801-6553 PT Time Calculation (min): 15 min  Charges:  $Therapeutic Activity: 8-22 mins                    G Codes:      Colleen [name],Colleen [name] Apr 22, 2014, 12:27 PM [facility] [phone] (pager)

## 2014-03-28 NOTE — Progress Notes (Addendum)
Speech Language Pathology Treatment: Dysphagia  Patient Details Name: Colleen [name] MRN: [id number] DOB: July 03, 1951 Today's Date: 03/28/2014 Time: 7048-8891 SLP Time Calculation (min): 22 min  Assessment / Plan / Recommendation Clinical Impression  Pt seen with NG out at bedside. Attention and delay in swallow response continue to be concerning for risk of aspiration. Max cues needed for pt to initiate and attend to basic functional tasks. Tactile cues needed to draw attention to left visual field. Pt noted to cough following cup sips of honey and also have increased coughing with upper respirtory congested sounds following PO trials. Given concern for aspiration event, will repeat MBS prior to diet .   HPI HPI: 63 y.o. female with a history of recent stroke on Wednesday who was discharged from [facility] after having coumadin stopped for some pethechial hemorrhage associated with the hemorrhage.  PMH:  HTN, hiatal hernia, GERD, COPD, esophagitis, chronic gastritis, pna, CVA 2009.  CXR 3/20 no edema or consolidation.   02/14/14 right lower lobe infiltrate and effusion.  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 Intra-axial hematoma with estimated volume of 53 mm, plus secondary extension of hemorrhage into the right lateral ventricle.  Repeat CT 3/21 No new intracranial hemorrhage or infarct identified.   No edema or consolidation.    Pertinent Vitals NA  SLP Plan  Continue with current plan of care    Recommendations Diet recommendations: NPO              Plan: Continue with current plan of care    GO     Ruble Pumphrey, Katherene Ponto 03/28/2014, 12:28 PM

## 2014-03-28 NOTE — Progress Notes (Signed)
Clinical Social Work Department CLINICAL SOCIAL WORK PLACEMENT NOTE 03/28/2014  Patient:  Colleen [name], Colleen [name]  Account Number:  [id number] Admit date:  03/18/2014  Clinical Social Worker:  Ky Barban, Latanya Presser  Date/time:  03/28/2014 11:41 AM  Clinical Social Work is seeking post-discharge placement for this patient at the following level of care:   [place]   (*CSW will update this form in Epic as items are completed)   03/28/2014  Patient/family provided with [place] Department of Clinical Social Work's list of facilities offering this level of care within the geographic area requested by the patient (or if unable, by the patient's family).  03/28/2014  Patient/family informed of their freedom to choose among providers that offer the needed level of care, that participate in Medicare, Medicaid or managed care program needed by the patient, have an available bed and are willing to accept the patient.  03/28/2014  Patient/family informed of [facility]' ownership interest in [facility], as well as of the fact that they are under no obligation to receive care at this facility.  PASARR submitted to EDS on 03/28/2014 PASARR number received from EDS on 03/28/2014  FL2 transmitted to all facilities in geographic area requested by pt/family on  03/28/2014 FL2 transmitted to all facilities within larger geographic area on   Patient informed that his/her managed care company has contracts with or will negotiate with  certain facilities, including the following:     Patient/family informed of bed offers received:   Patient chooses bed at  Physician recommends and patient chooses bed at    Patient to be transferred to  on   Patient to be transferred to facility by   The following physician request were entered in Epic:   Additional Comments:    Ky Barban, MSW, [place] Social Worker [phone]

## 2014-03-28 NOTE — Procedures (Addendum)
Objective Swallowing Evaluation: Modified Barium Swallowing Study  Patient Details  Name: BIANA HAGGAR MRN: [id number] Date of Birth: Oct 28, 1951  Today's Date: 03/28/2014 Time: 44-1405 SLP Time Calculation (min): 20 min  Past Medical History:  Past Medical History  Diagnosis Date  . Hypertension   . Hiatal hernia   . Adenomatous polyp of colon   . Acute asthmatic bronchitis   . GERD (gastroesophageal reflux disease)     EGD neg 03/07/2008, but prev required dilation  . COPD (chronic obstructive pulmonary disease)   . IBS (irritable bowel syndrome)     chronic  . Urinary incontinence   . Esophagitis   . Chronic gastritis   . Duodenitis without hemorrhage 10/01/2001  . Diverticulosis   . Personal history of colonic adenomas 02/05/2008        . Anemia   . Hyperlipidemia   . Decreased peripheral vision of right eye   . Pneumonia 2011  . Hyperthyroidism   . Type II diabetes mellitus   . Stroke     a. Hx of stroke and ministrokes ~ 2009. b. TIA 11/2013, dx with AF at that time.  . Arthritis   . Depression   . On home oxygen therapy     "2L; suppose to use 24/7" (01/05/2014)  . PAF (paroxysmal atrial fibrillation)     a. Prior hx. b. Recurred 11/2013 in setting of possible TIA. Lifelong Coumadin planned. Spont converted to NSR. EF 60-65% by echo 11/2013, normal cors 2007, normal nuc 2011.   . Atrial flutter     a. Dx 12/2013. s/p DCCV. On anticoagulation.   Past Surgical History:  Past Surgical History  Procedure Laterality Date  . Colonoscopy    . Upper gi endoscopy    . Cholecystectomy  1980's  . Vaginal hysterectomy  1992  . Breast biopsy Left 1987    "milk gland"  . Cardioversion N/A 01/06/2014    Procedure: CARDIOVERSION;  Surgeon: Lelon Perla, MD;  Location: [place];  Service: Cardiovascular;  Laterality: N/A;   HPI:  63 y.o. female with a history of recent stroke on Wednesday who was discharged from [facility] after having coumadin stopped for some pethechial  hemorrhage associated with the hemorrhage.  PMH:  HTN, hiatal hernia, GERD, COPD, esophagitis, chronic gastritis, pna, CVA 2009.  CXR 3/20 no edema or consolidation.   02/14/14 right lower lobe infiltrate and effusion.  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 Intra-axial hematoma with estimated volume of 53 mm, plus secondary extension of hemorrhage into the right lateral ventricle.  Repeat CT 3/21 No new intracranial hemorrhage or infarct identified.   No edema or consolidation.      Assessment / Plan / Recommendation Clinical Impression  Dysphagia Diagnosis: Severe oral phase dysphagia;Severe pharyngeal phase dysphagia Clinical impression: Pt demonstrates a severe oral and oropharyngeal dysphagia due to cognitive deficits and delayed swallow response. Pt orally holds bolus, sometimes with premature spillage to the pyriform sinuses; several seconds pass before a swallow is initiated. With boluses any larger than teaspoon size, pt is likely to aspirate before the swallow. Recommend Dys 1 (puree) diet and honey thick liquids given by teaspoon only. SLP will f/u for tolerance. As pts swallow trigger becomes more consistent and timely pt may upgrade to nectar.     Treatment Recommendation  Therapy as outlined in treatment plan below    Diet Recommendation Dysphagia 1 (Puree);Honey-thick liquid   Liquid Administration via:  Spoon Medication Administration: Crushed with puree Supervision: Trained caregiver to feed patient;Full supervision/cueing for compensatory strategies Compensations: Slow rate;Small sips/bites Postural Changes and/or Swallow Maneuvers: Seated upright 90 degrees    Other  Recommendations Oral Care Recommendations: Oral care BID Other Recommendations: Order thickener from pharmacy   Follow Up Recommendations  Inpatient Rehab    Frequency and Duration min 2x/week  2 weeks   Pertinent Vitals/Pain NA     SLP Swallow Goals     General HPI: 63 y.o. female with a history of recent stroke on Wednesday who was discharged from [facility] after having coumadin stopped for some pethechial hemorrhage associated with the hemorrhage.  PMH:  HTN, hiatal hernia, GERD, COPD, esophagitis, chronic gastritis, pna, CVA 2009.  CXR 3/20 no edema or consolidation.   02/14/14 right lower lobe infiltrate and effusion.  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 Intra-axial hematoma with estimated volume of 53 mm, plus secondary extension of hemorrhage into the right lateral ventricle.  Repeat CT 3/21 No new intracranial hemorrhage or infarct identified.   No edema or consolidation.  Type of Study: Modified Barium Swallowing Study Reason for Referral: Objectively evaluate swallowing function Previous Swallow Assessment: MBS 3/21 Diet Prior to this Study: NPO Temperature Spikes Noted: No Respiratory Status: Nasal cannula History of Recent Intubation: No Behavior/Cognition: Impulsive;Requires cueing;Distractible;Decreased sustained attention;Confused Oral Cavity - Dentition: Missing dentition;Edentulous Oral Motor / Sensory Function: Impaired - see Bedside swallow eval Self-Feeding Abilities: Total assist Patient Positioning: Postural control interferes with function Baseline Vocal Quality: Clear;Low vocal intensity Volitional Cough: Cognitively unable to elicit Volitional Swallow: Unable to elicit Anatomy: Within functional limits Pharyngeal Secretions: Not observed secondary MBS    Reason for Referral Objectively evaluate swallowing function   Oral Phase Oral Preparation/Oral Phase Oral Phase: Impaired Oral - Honey Oral - Honey Teaspoon: Delayed oral transit;Holding of bolus;Reduced posterior propulsion Oral - Nectar Oral - Nectar Teaspoon: Delayed oral transit;Holding of bolus;Reduced posterior propulsion Oral - Nectar Cup:  Delayed oral transit;Holding of bolus;Reduced posterior propulsion Oral - Solids Oral - Puree: Delayed oral transit;Holding of bolus;Reduced posterior propulsion   Pharyngeal Phase Pharyngeal Phase Pharyngeal Phase: Impaired Pharyngeal - Honey Pharyngeal - Honey Teaspoon: Premature spillage to pyriform sinuses;Delayed swallow initiation Penetration/Aspiration details (honey teaspoon): Material does not enter airway Pharyngeal - Nectar Pharyngeal - Nectar Teaspoon: Delayed swallow initiation;Premature spillage to pyriform sinuses Pharyngeal - Nectar Cup: Not tested Pharyngeal - Thin Pharyngeal - Thin Teaspoon: Not tested Pharyngeal - Thin Cup: Not tested Pharyngeal - Solids Pharyngeal - Puree: Delayed swallow initiation  Cervical Esophageal Phase    GO             Herbie Baltimore, MA CCC-SLP [phone]  Lynann Beaver 03/28/2014, 2:25 PM

## 2014-03-28 NOTE — Progress Notes (Signed)
Therapy in to assist with pt. I met briefly with spouse at bedside. I will follow pt's progress to assist with hopeful inpt rehab admission as appropriate. [phone]

## 2014-03-28 NOTE — Progress Notes (Signed)
Occupational Therapy Treatment Patient Details Name: Colleen [name] MRN: [id number] DOB: 1951-08-29 Today's Date: 03/28/2014    History of present illness Colleen [name] is a 63 y.o. female with a history of recent stroke on Wednesday who was discharged from [facility] after having coumadin stopped for some pethechial hemorrhage associated with the hemorrhage. Pt with R temporo-occipital hemorrhage   OT comments  This 63 yo female not making progress today due to lethargy and restlessness. Will continue to benefit from acute OT with follow up on CIR.  Follow Up Recommendations  CIR;Supervision/Assistance - 24 hour    Equipment Recommendations  3 in 1 bedside comode    Recommendations for Other Services Rehab consult    Precautions / Restrictions Precautions Precautions: Fall Precaution Comments: inattention to left. Restrictions Weight Bearing Restrictions: No       Mobility Bed Mobility Overal bed mobility: Needs Assistance Bed Mobility: Rolling;Sidelying to Sit;Sit to Supine Rolling: Total assist Sidelying to sit: Total assist Supine to sit: Max assist;+2 for physical assistance Sit to supine: Total assist   General bed mobility comments: tactile cues needed in addition to verbal to get any initiation.  Needed assist for LEs and trunk elevation.    Transfers Overall transfer level: Needs assistance Equipment used: 2 person hand held assist Transfers: Sit to/from [facility] Sit to Stand: Mod assist;+2 physical assistance;Max assist Stand pivot transfers: Mod assist;Max assist;+2 physical assistance       General transfer comment: Did not attempt due to being unsafe with +1 A    Balance Overall balance assessment: Needs assistance Sitting-balance support: Feet supported;Single extremity supported Sitting balance-Leahy Scale: Zero Sitting balance - Comments: Pt's EOB balance varied from min guard A to total A with tendency to lean right and  posteriorly. With verbal and tactile cues she did not attempt to correct, however would self correct at times (but not maintain). Only got her to have her head at midline with eyes at midline x1 (never saw her eyes go past midline to the left) Postural control: Posterior lean Standing balance support: Bilateral upper extremity supported;During functional activity Standing balance-Leahy Scale: Zero Standing balance comment: Pt initially stands upright but quickly thrusts herself posteriorly needing assist for postural stability.  Extensor tone limits safety with transitional movements.                     ADL                     General ADL Comments: The intent was to have pt sit up on EOB and work on grooming and/or visual tasks. However upon sitting pt up her balance was too varied to attempt either without +2 A (which I did not currently have). So, I just had her work on sitting balance with me and midline posture including head. See balance section for more details                Cognition   Behavior During Therapy: Restless;Flat affect Overall Cognitive Status: Impaired/Different from baseline Area of Impairment: Attention;Memory;Safety/judgement;Following commands Orientation Level: Disoriented to;Place;Time;Situation Current Attention Level: Focused Memory:  (She did not know the names of family in room)  Following Commands: Follows one step commands inconsistently Safety/Judgement: Decreased awareness of safety;Decreased awareness of deficits   Problem Solving: Slow processing;Decreased initiation;Difficulty sequencing;Requires verbal cues;Requires tactile cues  Frequency Min 2X/week     Progress Toward Goals  OT Goals(current goals can now be found in the care plan section)  Progress towards OT goals: Not progressing toward goals - comment (Due to lethargy and restlessness)     Plan Discharge plan remains appropriate     End of Session    Activity Tolerance Patient limited by lethargy   Patient Left in bed (with SLP finishing with pt)   Nurse Communication  (PT: bed alarm on, 4 side rails up, Bil Mitts)        Time: 6606-3016 OT Time Calculation (min): 24 min  Charges: OT General Charges $OT Visit: 1 Procedure OT Treatments $Therapeutic Activity: 23-37 mins  Almon Register [phone] 03/28/2014, 12:54 PM

## 2014-03-29 ENCOUNTER — Encounter: Payer: Self-pay | Admitting: Cardiology

## 2014-03-29 ENCOUNTER — Inpatient Hospital Stay (HOSPITAL_COMMUNITY)
Admission: AD | Admit: 2014-03-29 | Discharge: 2014-04-04 | DRG: 945 | Disposition: A | Discharge status: Other Acute Inpatient Hospital | Payer: Medicaid Other | Source: Intra-hospital | Attending: Internal Medicine | Admitting: Internal Medicine

## 2014-03-29 DIAGNOSIS — Z5189 Encounter for other specified aftercare: Principal | ICD-10-CM

## 2014-03-29 DIAGNOSIS — K2940 Chronic atrophic gastritis without bleeding: Secondary | ICD-10-CM | POA: Diagnosis present

## 2014-03-29 DIAGNOSIS — Z860100 Personal history of colon polyps, unspecified: Secondary | ICD-10-CM

## 2014-03-29 DIAGNOSIS — Z781 Physical restraint status: Secondary | ICD-10-CM | POA: Diagnosis present

## 2014-03-29 DIAGNOSIS — G936 Cerebral edema: Secondary | ICD-10-CM | POA: Diagnosis present

## 2014-03-29 DIAGNOSIS — R002 Palpitations: Secondary | ICD-10-CM

## 2014-03-29 DIAGNOSIS — R1312 Dysphagia, oropharyngeal phase: Secondary | ICD-10-CM | POA: Diagnosis present

## 2014-03-29 DIAGNOSIS — IMO0001 Reserved for inherently not codable concepts without codable children: Secondary | ICD-10-CM

## 2014-03-29 DIAGNOSIS — E079 Disorder of thyroid, unspecified: Secondary | ICD-10-CM

## 2014-03-29 DIAGNOSIS — R569 Unspecified convulsions: Secondary | ICD-10-CM

## 2014-03-29 DIAGNOSIS — Z825 Family history of asthma and other chronic lower respiratory diseases: Secondary | ICD-10-CM

## 2014-03-29 DIAGNOSIS — Z7901 Long term (current) use of anticoagulants: Secondary | ICD-10-CM

## 2014-03-29 DIAGNOSIS — R32 Unspecified urinary incontinence: Secondary | ICD-10-CM | POA: Diagnosis present

## 2014-03-29 DIAGNOSIS — I6389 Other cerebral infarction: Secondary | ICD-10-CM

## 2014-03-29 DIAGNOSIS — I10 Essential (primary) hypertension: Secondary | ICD-10-CM

## 2014-03-29 DIAGNOSIS — E119 Type 2 diabetes mellitus without complications: Secondary | ICD-10-CM

## 2014-03-29 DIAGNOSIS — I69991 Dysphagia following unspecified cerebrovascular disease: Secondary | ICD-10-CM

## 2014-03-29 DIAGNOSIS — G459 Transient cerebral ischemic attack, unspecified: Secondary | ICD-10-CM

## 2014-03-29 DIAGNOSIS — Z0389 Encounter for observation for other suspected diseases and conditions ruled out: Secondary | ICD-10-CM

## 2014-03-29 DIAGNOSIS — Y95 Nosocomial condition: Secondary | ICD-10-CM

## 2014-03-29 DIAGNOSIS — J189 Pneumonia, unspecified organism: Secondary | ICD-10-CM

## 2014-03-29 DIAGNOSIS — I4891 Unspecified atrial fibrillation: Secondary | ICD-10-CM

## 2014-03-29 DIAGNOSIS — J209 Acute bronchitis, unspecified: Secondary | ICD-10-CM

## 2014-03-29 DIAGNOSIS — Z800 Family history of malignant neoplasm of digestive organs: Secondary | ICD-10-CM

## 2014-03-29 DIAGNOSIS — I639 Cerebral infarction, unspecified: Secondary | ICD-10-CM

## 2014-03-29 DIAGNOSIS — I629 Nontraumatic intracranial hemorrhage, unspecified: Secondary | ICD-10-CM

## 2014-03-29 DIAGNOSIS — I5189 Other ill-defined heart diseases: Secondary | ICD-10-CM

## 2014-03-29 DIAGNOSIS — J449 Chronic obstructive pulmonary disease, unspecified: Secondary | ICD-10-CM | POA: Diagnosis present

## 2014-03-29 DIAGNOSIS — Z9089 Acquired absence of other organs: Secondary | ICD-10-CM

## 2014-03-29 DIAGNOSIS — J9620 Acute and chronic respiratory failure, unspecified whether with hypoxia or hypercapnia: Secondary | ICD-10-CM

## 2014-03-29 DIAGNOSIS — R0602 Shortness of breath: Secondary | ICD-10-CM

## 2014-03-29 DIAGNOSIS — J9611 Chronic respiratory failure with hypoxia: Secondary | ICD-10-CM

## 2014-03-29 DIAGNOSIS — K5730 Diverticulosis of large intestine without perforation or abscess without bleeding: Secondary | ICD-10-CM | POA: Diagnosis present

## 2014-03-29 DIAGNOSIS — Z9981 Dependence on supplemental oxygen: Secondary | ICD-10-CM

## 2014-03-29 DIAGNOSIS — Z8249 Family history of ischemic heart disease and other diseases of the circulatory system: Secondary | ICD-10-CM

## 2014-03-29 DIAGNOSIS — Z888 Allergy status to other drugs, medicaments and biological substances status: Secondary | ICD-10-CM

## 2014-03-29 DIAGNOSIS — G40309 Generalized idiopathic epilepsy and epileptic syndromes, not intractable, without status epilepticus: Secondary | ICD-10-CM

## 2014-03-29 DIAGNOSIS — E785 Hyperlipidemia, unspecified: Secondary | ICD-10-CM

## 2014-03-29 DIAGNOSIS — I359 Nonrheumatic aortic valve disorder, unspecified: Secondary | ICD-10-CM

## 2014-03-29 DIAGNOSIS — I6340 Cerebral infarction due to embolism of unspecified cerebral artery: Secondary | ICD-10-CM | POA: Diagnosis present

## 2014-03-29 DIAGNOSIS — I619 Nontraumatic intracerebral hemorrhage, unspecified: Secondary | ICD-10-CM | POA: Diagnosis present

## 2014-03-29 DIAGNOSIS — Z8673 Personal history of transient ischemic attack (TIA), and cerebral infarction without residual deficits: Secondary | ICD-10-CM

## 2014-03-29 DIAGNOSIS — E669 Obesity, unspecified: Secondary | ICD-10-CM

## 2014-03-29 DIAGNOSIS — F411 Generalized anxiety disorder: Secondary | ICD-10-CM | POA: Diagnosis present

## 2014-03-29 DIAGNOSIS — E0590 Thyrotoxicosis, unspecified without thyrotoxic crisis or storm: Secondary | ICD-10-CM | POA: Diagnosis present

## 2014-03-29 DIAGNOSIS — F17200 Nicotine dependence, unspecified, uncomplicated: Secondary | ICD-10-CM

## 2014-03-29 DIAGNOSIS — Z91040 Latex allergy status: Secondary | ICD-10-CM

## 2014-03-29 DIAGNOSIS — K219 Gastro-esophageal reflux disease without esophagitis: Secondary | ICD-10-CM

## 2014-03-29 DIAGNOSIS — Z801 Family history of malignant neoplasm of trachea, bronchus and lung: Secondary | ICD-10-CM

## 2014-03-29 DIAGNOSIS — R0609 Other forms of dyspnea: Secondary | ICD-10-CM | POA: Diagnosis not present

## 2014-03-29 DIAGNOSIS — K589 Irritable bowel syndrome without diarrhea: Secondary | ICD-10-CM | POA: Diagnosis present

## 2014-03-29 DIAGNOSIS — F329 Major depressive disorder, single episode, unspecified: Secondary | ICD-10-CM | POA: Diagnosis present

## 2014-03-29 DIAGNOSIS — G40909 Epilepsy, unspecified, not intractable, without status epilepticus: Secondary | ICD-10-CM | POA: Diagnosis present

## 2014-03-29 DIAGNOSIS — R1310 Dysphagia, unspecified: Secondary | ICD-10-CM

## 2014-03-29 DIAGNOSIS — I4892 Unspecified atrial flutter: Secondary | ICD-10-CM

## 2014-03-29 DIAGNOSIS — J4489 Other specified chronic obstructive pulmonary disease: Secondary | ICD-10-CM

## 2014-03-29 DIAGNOSIS — K449 Diaphragmatic hernia without obstruction or gangrene: Secondary | ICD-10-CM

## 2014-03-29 DIAGNOSIS — F3289 Other specified depressive episodes: Secondary | ICD-10-CM | POA: Diagnosis present

## 2014-03-29 DIAGNOSIS — R0989 Other specified symptoms and signs involving the circulatory and respiratory systems: Secondary | ICD-10-CM

## 2014-03-29 DIAGNOSIS — R079 Chest pain, unspecified: Secondary | ICD-10-CM

## 2014-03-29 LAB — GLUCOSE, CAPILLARY
GLUCOSE-CAPILLARY: 100 mg/dL — AB (ref 70–99)
Glucose-Capillary: 131 mg/dL — ABNORMAL HIGH (ref 70–99)
Glucose-Capillary: 139 mg/dL — ABNORMAL HIGH (ref 70–99)
Glucose-Capillary: 97 mg/dL (ref 70–99)
Glucose-Capillary: 97 mg/dL (ref 70–99)

## 2014-03-29 LAB — COMPREHENSIVE METABOLIC PANEL
ALBUMIN: 2.4 g/dL — AB (ref 3.5–5.2)
ALT: 17 U/L (ref 0–35)
AST: 27 U/L (ref 0–37)
Alkaline Phosphatase: 71 U/L (ref 39–117)
BUN: 15 mg/dL (ref 6–23)
CALCIUM: 9 mg/dL (ref 8.4–10.5)
CO2: 34 mEq/L — ABNORMAL HIGH (ref 19–32)
Chloride: 91 mEq/L — ABNORMAL LOW (ref 96–112)
Creatinine, Ser: 0.46 mg/dL — ABNORMAL LOW (ref 0.50–1.10)
GFR calc Af Amer: >90 mL/min (ref >90)
GFR calc non Af Amer: >90 mL/min (ref >90)
Glucose, Bld: 107 mg/dL — ABNORMAL HIGH (ref 70–99)
Potassium: 3.2 mEq/L — ABNORMAL LOW (ref 3.7–5.3)
SODIUM: 141 meq/L (ref 137–147)
Total Bilirubin: 0.6 mg/dL (ref 0.3–1.2)
Total Protein: 6.7 g/dL (ref 6.0–8.3)

## 2014-03-29 LAB — MAGNESIUM: Magnesium: 1.9 mg/dL (ref 1.5–2.5)

## 2014-03-29 LAB — CBC
HEMATOCRIT: 38.2 % (ref 36.0–46.0)
Hemoglobin: 11.1 g/dL — ABNORMAL LOW (ref 12.0–15.0)
MCH: 22.4 pg — AB (ref 26.0–34.0)
MCHC: 29.1 g/dL — ABNORMAL LOW (ref 30.0–36.0)
MCV: 77 fL — AB (ref 78.0–100.0)
Platelets: 208 10*3/uL (ref 150–400)
RBC: 4.96 MIL/uL (ref 3.87–5.11)
RDW: 20 % — ABNORMAL HIGH (ref 11.5–15.5)
WBC: 12.2 10*3/uL — AB (ref 4.0–10.5)

## 2014-03-29 LAB — TSH: TSH: 2.17 u[IU]/mL (ref 0.350–4.500)

## 2014-03-29 MED ORDER — ACETAMINOPHEN 325 MG PO TABS
650.0000 mg | ORAL_TABLET | ORAL | Status: DC | PRN
  Administered 2014-04-04: 650 mg via ORAL
  Filled 2014-03-29 (×2): qty 2

## 2014-03-29 MED ORDER — SORBITOL 70 % SOLN: 30.0000 mL | Freq: Every day | Status: DC | PRN

## 2014-03-29 MED ORDER — CHLORHEXIDINE GLUCONATE 0.12 % MT SOLN
15.0000 mL | Freq: Two times a day (BID) | OROMUCOSAL | Status: DC
  Administered 2014-03-31 – 2014-04-03 (×8): 15 mL via OROMUCOSAL
  Filled 2014-03-29 (×14): qty 15

## 2014-03-29 MED ORDER — LEVALBUTEROL HCL 0.63 MG/3ML IN NEBU: 0.6300 mg | INHALATION_SOLUTION | RESPIRATORY_TRACT | Status: DC | PRN

## 2014-03-29 MED ORDER — BUDESONIDE 0.25 MG/2ML IN SUSP
0.2500 mg | Freq: Two times a day (BID) | RESPIRATORY_TRACT | Status: DC
  Administered 2014-03-29 – 2014-04-03 (×8): 0.25 mg via RESPIRATORY_TRACT
  Filled 2014-03-29 (×14): qty 2

## 2014-03-29 MED ORDER — VALPROIC ACID 250 MG/5ML PO SYRP
500.0000 mg | ORAL_SOLUTION | Freq: Three times a day (TID) | ORAL | Status: DC
  Administered 2014-03-29 – 2014-04-03 (×14): 500 mg via ORAL
  Filled 2014-03-29 (×22): qty 10

## 2014-03-29 MED ORDER — METOPROLOL TARTRATE 100 MG PO TABS
100.0000 mg | ORAL_TABLET | Freq: Three times a day (TID) | ORAL | Status: DC
  Administered 2014-03-29 – 2014-04-03 (×14): 100 mg via ORAL
  Filled 2014-03-29 (×21): qty 1

## 2014-03-29 MED ORDER — ATORVASTATIN CALCIUM 20 MG PO TABS
20.0000 mg | ORAL_TABLET | Freq: Every evening | ORAL | Status: DC
  Administered 2014-03-30 – 2014-04-03 (×5): 20 mg via ORAL
  Filled 2014-03-29 (×6): qty 1

## 2014-03-29 MED ORDER — METHIMAZOLE 5 MG PO TABS
5.0000 mg | ORAL_TABLET | Freq: Every day | ORAL | Status: DC
  Administered 2014-03-30 – 2014-04-03 (×5): 5 mg via ORAL
  Filled 2014-03-29 (×9): qty 1

## 2014-03-29 MED ORDER — ENSURE PUDDING PO PUDG
1.0000 | Freq: Three times a day (TID) | ORAL | Status: DC
  Administered 2014-03-29: 1 via ORAL

## 2014-03-29 MED ORDER — DILTIAZEM 12 MG/ML ORAL SUSPENSION
90.0000 mg | Freq: Four times a day (QID) | ORAL | Status: DC
  Administered 2014-03-29 – 2014-04-04 (×21): 90 mg via ORAL
  Filled 2014-03-29 (×28): qty 9

## 2014-03-29 MED ORDER — ENSURE PUDDING PO PUDG
1.0000 | Freq: Three times a day (TID) | ORAL | Status: DC
  Administered 2014-03-29 – 2014-03-30 (×2): 1 via ORAL

## 2014-03-29 MED ORDER — BIOTENE DRY MOUTH MT LIQD
15.0000 mL | Freq: Two times a day (BID) | OROMUCOSAL | Status: DC
  Administered 2014-03-30 – 2014-04-03 (×7): 15 mL via OROMUCOSAL

## 2014-03-29 MED ORDER — ALPRAZOLAM 0.25 MG PO TABS
0.2500 mg | ORAL_TABLET | Freq: Every day | ORAL | Status: DC
  Administered 2014-03-29 – 2014-04-03 (×5): 0.25 mg via ORAL
  Filled 2014-03-29 (×5): qty 1

## 2014-03-29 MED ORDER — PIPERACILLIN-TAZOBACTAM 3.375 G IVPB
3.3750 g | Freq: Three times a day (TID) | INTRAVENOUS | Status: DC
  Administered 2014-03-31 – 2014-04-04 (×13): 3.375 g via INTRAVENOUS
  Filled 2014-03-29 (×14): qty 50

## 2014-03-29 MED ORDER — INSULIN ASPART 100 UNIT/ML ~~LOC~~ SOLN
0.0000 [IU] | Freq: Three times a day (TID) | SUBCUTANEOUS | Status: DC
  Administered 2014-04-01 (×2): 1 [IU] via SUBCUTANEOUS
  Administered 2014-04-01: 2 [IU] via SUBCUTANEOUS
  Administered 2014-04-02 – 2014-04-03 (×3): 1 [IU] via SUBCUTANEOUS

## 2014-03-29 MED ORDER — ONDANSETRON HCL 4 MG/2ML IJ SOLN: 4.0000 mg | Freq: Four times a day (QID) | INTRAMUSCULAR | Status: DC | PRN

## 2014-03-29 MED ORDER — SODIUM CHLORIDE 0.45 % IV SOLN
INTRAVENOUS | Status: DC
  Administered 2014-03-29 – 2014-03-31 (×3): via INTRAVENOUS

## 2014-03-29 MED ORDER — ONDANSETRON HCL 4 MG PO TABS: 4.0000 mg | ORAL_TABLET | Freq: Four times a day (QID) | ORAL | Status: DC | PRN

## 2014-03-29 MED ORDER — DIGOXIN 125 MCG PO TABS
0.1250 mg | ORAL_TABLET | Freq: Every day | ORAL | Status: DC
  Administered 2014-03-30 – 2014-04-03 (×5): 0.125 mg via ORAL
  Filled 2014-03-29 (×8): qty 1

## 2014-03-29 MED ORDER — VALPROIC ACID 250 MG/5ML PO SYRP
500.0000 mg | ORAL_SOLUTION | Freq: Three times a day (TID) | ORAL | Status: DC
  Administered 2014-03-29: 500 mg via ORAL
  Filled 2014-03-29 (×2): qty 10

## 2014-03-29 MED ORDER — POTASSIUM CHLORIDE CRYS ER 20 MEQ PO TBCR
40.0000 meq | EXTENDED_RELEASE_TABLET | Freq: Two times a day (BID) | ORAL | Status: DC
  Administered 2014-03-29: 40 meq via ORAL
  Filled 2014-03-29: qty 2

## 2014-03-29 MED ORDER — ACETAMINOPHEN 650 MG RE SUPP: 650.0000 mg | RECTAL | Status: DC | PRN

## 2014-03-29 NOTE — Interval H&P Note (Signed)
Colleen [name] was admitted today to Inpatient Rehabilitation with the diagnosis of right temporal occipital infarct with hemorrhagic transformation.  The patient's history has been reviewed, patient examined, and there is no change in status.  Patient continues to be appropriate for intensive inpatient rehabilitation.  I have reviewed the patient's chart and labs.  Questions were answered to the patient's satisfaction.  SWARTZ,ZACHARY T 03/29/2014, 9:52 PM

## 2014-03-29 NOTE — Progress Notes (Signed)
Speech Language Pathology Treatment: Dysphagia;Cognitive-Linquistic  Patient Details Name: Colleen [name] MRN: [id number] DOB: 1951/11/28 Today's Date: 03/29/2014 Time: 5956-3875 SLP Time Calculation (min): 24 min  Assessment / Plan / Recommendation Clinical Impression  Pt less alert during today's session, suspect pt fatigued in the pm. Swallow trigger even more significantly delayed today with 10 seconds passing between teaspoon presentation and swallow despite max cues. Positive signs of aspiration with honey x3 out of 15 trials. Pt tolerated pudding without any coughing. Recommend another downgrade to dys 1 pudding thick liquids until presentation more consistent.    HPI HPI: 63 y.o. female with a history of recent stroke on Wednesday who was discharged from [facility] after having coumadin stopped for some pethechial hemorrhage associated with the hemorrhage.  PMH:  HTN, hiatal hernia, GERD, COPD, esophagitis, chronic gastritis, pna, CVA 2009.  CXR 3/20 no edema or consolidation.   02/14/14 right lower lobe infiltrate and effusion.  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 Intra-axial hematoma with estimated volume of 53 mm, plus secondary extension of hemorrhage into the right lateral ventricle.  Repeat CT 3/21 No new intracranial hemorrhage or infarct identified.   No edema or consolidation.    Pertinent Vitals NA  SLP Plan  Continue with current plan of care    Recommendations Diet recommendations: Dysphagia 1 (puree);Pudding-thick liquid Liquids provided via: Teaspoon Medication Administration: Crushed with puree Supervision: Trained caregiver to feed patient;Full supervision/cueing for compensatory strategies Compensations: Slow rate;Small sips/bites Postural Changes and/or Swallow Maneuvers: Seated upright 90 degrees              Oral Care Recommendations: Oral care BID Follow up Recommendations:  Inpatient Rehab Plan: Continue with current plan of care    GO     Ryle Buscemi, Katherene Ponto 03/29/2014, 4:00 PM

## 2014-03-29 NOTE — Progress Notes (Signed)
Progress Note [place] TEAM 1 - Stepdown/ICU TEAM   Colleen [name] KDX:[id number] DOB: [birth year] DOA: 03/18/2014 PCP: [name], NP  Brief narrative: 63 y.o. female who presented on 03/18/2014 with a history of Hypertension; Hiatal hernia; Adenomatous polyp of colon; GERD; COPD; IBS  Esophagitis; Chronic gastritis; Duodenitis without hemorrhage; Diverticulosis; Anemia; Hyperlipidemia;  Hyperthyroidism; Type II diabetes mellitus; prior Stroke; Arthritis; Depression; PAF and Atrial flutter who c/o increasing left sided weakness. She was admitted to [facility] for a right occipital infarct on 9/76 felt to be embolic from A-fib. CT also showed a petechial hemorrhage and her coumadin was discontinued. Despite this, on re-presentation she was found to have hemorrhagic conversion of her ischemic infarct. INR was 2.61 despite stopping coumadin. This was reversed with Vit K.  RN noted labored respirations during the night 3/25 - increased rhonchi, wheezing and crackles, unproductive cough, given lasix. This was found to be stable during rounds by Dr. Leonie Man. Again with respiratory distress during the night 3/26. Seen by Aram Beecham; transferred back to the ICU. Suspect by ICU Team that pt was developing pneumonia.  She was transferred out of the ICU again and onto the Hospitalist service on 3/28.   SIGNIFICANT EVENTS / STUDIES:  3/18 - ischemic right occipital CVA (seen at [facility])  3/20 - admitted to [facility] w/ hemorrhagic conversion at the right temporal-occipital region with intraventricular extension into the right lateral ventricle complicated by questionable seizures 3/26 - transferred to MICU for respiratory distress  Subjective: Pt remains lethargic, but does not appear uncomfortable.    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 - per Neuro - no further w/u planned - [employer] has signed off - per Neuro "repeat CT in 1 week from now - if hemorrhage is resolving at  that time, can consider anticoagulation - for now, not an anticoagulation candidate secondary to hemorrhage" - CIR consulted per PT recommendations - hopeful for d/c to CIR today  - slow to improve globally - to transfer to CIR   Dysphagia due to CVA - now cleared for dysphagia diet per SLP - follow intake - tolerating w/o difficulty thus far   Atrial fibrillation - rate control now accomplished - cont current medical tx - unable to anticoag due to above (see note)  HCAP (hospital-acquired pneumonia) - RLL atelectasis vs infiltrate on cxr on 3/27 - on Zosyn empirically   Enterobacter UTI w/ sepsis  - per UA on 3/26 - cont Zosyn - will need a 7 day course of antibiotics for this as it may be foley related (to end 4/01) - sepsis physiology resolved   COPD - on 2 L O2 at baseline - stable w/o acute compensation   Diabetes mellitus - stable on sensitive sliding scale  Diastolic dysfunction grade 2 by Echo 12/14 - compensated  Hypokalemia - cont to replace and follow    HLP - statin  Hyponatremia - resolved   Code Status: FULL Family Communication: no family present at time of exam today  Disposition Plan: HR better controlled - stable for d/c to CIR as soon as bed available   Consultants: Neuro PCCM >> TRH  Procedures: 2D Echocardiogram Left ventricle: The cavity size was normal. Wall thickness was normal. Systolic function was normal. The estimated ejection fraction was in the range of 55% to 60%. Wall motion was normal; there were no regional wall motion abnormalities.  Carotid Doppler 03/18/2014 1-39% internal carotid artery stenosis bilaterally. Vertebral arteries are patent with antegrade flow.  EEG 03/22/2014 - most consistent with a generalized nonspecific cerebral dysfunction. There was no seizure recorded on this study.  EEG 03/21/2014 This recorded 2 likely epileptic seizures addition to a moderate to severe generalized nonspecific cerebral dysfunction. There was  no seizure or seizure predisposition recorded on this study.   Antibiotics: Vanc 3/26 Zosyn 3/26 >> 4/01 (planned)  DVT prophylaxis: SCDs  Objective: Filed Weights   03/27/14 0410 03/28/14 0419 03/29/14 0300  Weight: 70.1 kg (154 lb 8.7 oz) 67.6 kg (149 lb 0.5 oz) 67.6 kg (149 lb 0.5 oz)   Blood pressure 158/72, pulse 85, temperature 97.9 F (36.6 C), temperature source Axillary, resp. rate 24, height 5\' 5"  (1.651 m), weight 67.6 kg (149 lb 0.5 oz), SpO2 100.00%.  Intake/Output Summary (Last 24 hours) at 03/29/14 1155 Last data filed at 03/28/14 2000  Gross per 24 hour  Intake    100 ml  Output      0 ml  Net    100 ml   Exam: General: poorly communicative - somnolent  - no acute respiratory distress Lungs: Clear to auscultation bilaterally without wheezes or crackles Cardiovascular: RRR w/o M, G, or R Abdomen: Nontender, nondistended, soft, bowel sounds positive, no rebound, no ascites, no appreciable mass Extremities: No significant cyanosis, clubbing, or edema bilateral lower extremities  Data Reviewed: Basic Metabolic Panel:  Recent Labs Lab 03/25/14 0440 03/26/14 0320 03/27/14 0933 03/28/14 0334 03/29/14 0314  NA 140 138 135* 140 141  K 3.3* 3.1* 3.2* 3.9 3.2*  CL 93* 93* 89* 92* 91*  CO2 31 31 36* 36* 34*  GLUCOSE 114* 120* 150* 109* 107*  BUN 16 19 19 15 15   CREATININE 0.51 0.52 0.44* 0.40* 0.46*  CALCIUM 9.5 9.2 9.0 9.3 9.0  MG  --   --   --   --  1.9   Liver Function Tests:  Recent Labs Lab 03/29/14 0314  AST 27  ALT 17  ALKPHOS 71  BILITOT 0.6  PROT 6.7  ALBUMIN 2.4*    CBC:  Recent Labs Lab 03/26/14 0320 03/29/14 0314  WBC 16.6* 12.2*  HGB 11.9* 11.1*  HCT 39.5 38.2  MCV 74.5* 77.0*  PLT 242 208   CBG:  Recent Labs Lab 03/28/14 1856 03/28/14 1927 03/28/14 2332 03/29/14 0401 03/29/14 0825  GLUCAP 133* 138* 97 100* 97    Recent Results (from the past 240 hour(s))  MRSA PCR SCREENING     Status: None   Collection Time      03/24/14  9:06 AM      Result Value Ref Range Status   MRSA by PCR NEGATIVE  NEGATIVE Final   Comment:            The GeneXpert MRSA Assay (FDA     approved for NASAL specimens     only), is one component of a     comprehensive MRSA colonization     surveillance program. It is not     intended to diagnose MRSA     infection nor to guide or     monitor treatment for     MRSA infections.  CULTURE, BLOOD (ROUTINE X 2)     Status: None   Collection Time    03/24/14  1:00 PM      Result Value Ref Range Status   Specimen Description BLOOD RIGHT HAND   Final   Special Requests BOTTLES DRAWN AEROBIC ONLY 3CC   Final   Culture  Setup Time  Final   Value: 03/24/2014 17:01     Performed at [facility]   Culture     Final   Value:        BLOOD CULTURE RECEIVED NO GROWTH TO DATE CULTURE WILL BE HELD FOR 5 DAYS BEFORE ISSUING A FINAL NEGATIVE REPORT     Performed at [facility]   Report Status PENDING   Incomplete  CULTURE, BLOOD (ROUTINE X 2)     Status: None   Collection Time    03/24/14  1:15 PM      Result Value Ref Range Status   Specimen Description BLOOD RIGHT ARM   Final   Special Requests BOTTLES DRAWN AEROBIC ONLY 5CC   Final   Culture  Setup Time     Final   Value: 03/24/2014 17:01     Performed at [facility]   Culture     Final   Value:        BLOOD CULTURE RECEIVED NO GROWTH TO DATE CULTURE WILL BE HELD FOR 5 DAYS BEFORE ISSUING A FINAL NEGATIVE REPORT     Performed at [facility]   Report Status PENDING   Incomplete  URINE CULTURE     Status: None   Collection Time    03/24/14  1:50 PM      Result Value Ref Range Status   Specimen Description URINE, CATHETERIZED   Final   Special Requests NONE   Final   Culture  Setup Time     Final   Value: 03/24/2014 17:03     Performed at [facility] Count     Final   Value: >=100,000 COLONIES/ML     Performed at [facility]   Culture     Final   Value:  ENTEROBACTER CLOACAE     Performed at [facility]   Report Status 03/27/2014 FINAL   Final   Organism ID, Bacteria ENTEROBACTER CLOACAE   Final     Studies:  Recent x-ray studies have been reviewed in detail by the Attending Physician  Scheduled Meds:  Scheduled Meds: . ALPRAZolam  0.25 mg Oral QHS  . antiseptic oral rinse  15 mL Mouth Rinse q12n4p  . atorvastatin  20 mg Oral QPM  . budesonide  0.25 mg Nebulization BID  . chlorhexidine  15 mL Mouth Rinse BID  . digoxin  0.125 mg Oral Daily  . diltiazem  90 mg Oral 4 times per day  . feeding supplement (ENSURE)  1 Container Oral TID BM  . feeding supplement (VITAL AF 1.2 CAL)  1,000 mL Per Tube Q24H  . insulin aspart  0-9 Units Subcutaneous TID WC  . methimazole  5 mg Oral Daily  . metoprolol tartrate  100 mg Oral TID  . piperacillin-tazobactam (ZOSYN)  IV  3.375 g Intravenous Q8H  . valproate sodium  500 mg Intravenous 3 times per day   Time spent on care of this patient: 35 mins   Suzie Vandam T, MD  [facility] Office  [phone] Pager - Text Page per Shea Evans as per below:  On-Call/Text Page:      [email or web address]  If 7PM-7AM, please contact night-coverage [email or web address] 03/29/2014, 11:55 AM   LOS: 11 days

## 2014-03-29 NOTE — Progress Notes (Addendum)
Pt currently has no bed offers from SNFs. Facilities either do not have Medicaid beds available and/or are denying her because she will need therapy and this cost is not covered by Medicaid. Texted my supervisor requesting a call back to inquire about a LOG for patient.  Addendum: Supervisor recommends sending referrals to [facility] and [place] as difficult to place patient. Made referral to [facility] earlier this morning, and called admissions explaining pt's situation and requesting he review her clinicals and give me a call. Sent referral to [facility] as well.  Ky Barban, MSW, [facility] Clinical Social Worker [phone]

## 2014-03-29 NOTE — Progress Notes (Signed)
Taking pt to inpatient rehab per md orders. Family notified. Pt's appetite is increasing and pt ate a whole ensure pudding and part of an ice cream. Pt had several incontinent bowel episodes today and pt cleaned up prior to taking to inpatient rehab. Nurse and nursing assistant took pt to rehab and notified nurse assuming care who came into room and observed pt. Nurse and nurse assistant checked pt to see if she had a bowel movement prior to leaving pt at inpatient rehab. Pt was clean. Called Dr. Thereasa Solo to notify could not print discharge on pt and was informed that it was not necessary to print d/c on pt as pt is still in hospital under rehab. Sent pt's medications and chart to inpatient rehab upon request and notification of secretary and rehab nurse that they require these for their department. Rehab nurse was given report prior to taking pt to rehab. Central telemetry was notified of pt transferring to rehab and discharged from 2 central.

## 2014-03-29 NOTE — H&P (View-Only) (Signed)
Physical Medicine and Rehabilitation Admission H&P    Chief Complaint  Patient presents with  . Code Stroke  :  Chief complaint; weakness  HPI: Colleen [name] is a 63 y.o. right-handed female with history of COPD, type 2 diabetes mellitus, hypertension and atrial fibrillation with Coumadin therapy. Patient with recent right occipital infarct on Wednesday he 03/16/2014 was seen at [facility] following a CT showed petechial hemorrhage. She was discharged to home after stopping her Coumadin. She presented to [facility] 03/18/2014 after developing severe headache with nausea vomiting and left-sided weakness. CT and imaging showed right temporal occipital region infarct with intraventricular extension into the right lateral ventricle with cerebral edema and left midline shift. She was not a TPA candidate. Noted INR on admission of 2.61 it was reversed. Followup cranial CT scan 03/27/2014 shows slight decrease in right intraparenchymal hematoma. Plan was to followup CT scan in one week if hemorrhage continues to resolve may consider anticoagulation. Echocardiogram with ejection fraction of 60% no wall motion abnormalities. Recent carotid Dopplers with no ICA stenosis. 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 Followup neurology services maintained on valproate 500 mg every 8 hours.HCAP and maintained on empiric Zosyn x7 days initiated 03/24/2014. Panda tube was placed for for nutritional support however patient pulled out the tube 03/28/2014.. followup speech therapy placed on dysphagia 1 honey thick liquid diet. Physical therapy evaluation completed 03/21/2014 with recommendations for physical medicine rehabilitation consult. Patient was admitted for comprehensive rehabilitation program   ROS Review of Systems  Unable to perform ROS: mental acuity   Past Medical History  Diagnosis Date  . Hypertension   . Hiatal  hernia   . Adenomatous polyp of colon   . Acute asthmatic bronchitis   . GERD (gastroesophageal reflux disease)     EGD neg 03/07/2008, but prev required dilation  . COPD (chronic obstructive pulmonary disease)   . IBS (irritable bowel syndrome)     chronic  . Urinary incontinence   . Esophagitis   . Chronic gastritis   . Duodenitis without hemorrhage 10/01/2001  . Diverticulosis   . Personal history of colonic adenomas 02/05/2008        . Anemia   . Hyperlipidemia   . Decreased peripheral vision of right eye   . Pneumonia 2011  . Hyperthyroidism   . Type II diabetes mellitus   . Stroke     a. Hx of stroke and ministrokes ~ 2009. b. TIA 11/2013, dx with AF at that time.  . Arthritis   . Depression   . On home oxygen therapy     "2L; suppose to use 24/7" (01/05/2014)  . PAF (paroxysmal atrial fibrillation)     a. Prior hx. b. Recurred 11/2013 in setting of possible TIA. Lifelong Coumadin planned. Spont converted to NSR. EF 60-65% by echo 11/2013, normal cors 2007, normal nuc 2011.   . Atrial flutter     a. Dx 12/2013. s/p DCCV. On anticoagulation.   Past Surgical History  Procedure Laterality Date  . Colonoscopy    . Upper gi endoscopy    . Cholecystectomy  1980's  . Vaginal hysterectomy  1992  . Breast biopsy Left 1987    "milk gland"  . Cardioversion N/A 01/06/2014    Procedure: CARDIOVERSION;  Surgeon: Lelon Perla, MD;  Location: [facility] OR;  Service: Cardiovascular;  Laterality: N/A;   Family History  Problem Relation Age of Onset  . Heart disease  Mother   . Asthma Father   . Lung cancer Father   . Asthma Sister   . Heart disease Sister   . Colon cancer Father    Social History:  reports that she has been smoking Cigarettes.  She has a 51 pack-year smoking history. She has never used smokeless tobacco. She reports that she does not drink alcohol or use illicit drugs. Allergies:  Allergies  Allergen Reactions  . Crestor [Rosuvastatin]   . Latex Hives  . Wellbutrin  [Bupropion]    Medications Prior to Admission  Medication Sig Dispense Refill  . ALPRAZolam (XANAX) 0.5 MG tablet Take 0.5 mg by mouth at bedtime.       [name] atorvastatin (LIPITOR) 20 MG tablet Take 1 tablet (20 mg total) by mouth every evening.  30 tablet  6  . cetirizine (ZYRTEC) 10 MG tablet Take 10 mg by mouth at bedtime.       . digoxin (LANOXIN) 0.25 MG tablet Take 0.5 tablets (0.125 mg total) by mouth daily.  30 tablet  0  . diltiazem (CARDIZEM CD) 360 MG 24 hr capsule Take 1 capsule (360 mg total) by mouth daily.  30 capsule  0  . DULoxetine (CYMBALTA) 60 MG capsule Take 120 mg by mouth daily.      . ferrous sulfate 325 (65 FE) MG tablet Take 325 mg by mouth 2 (two) times daily with a meal.      . Fluticasone-Salmeterol (ADVAIR DISKUS) 250-50 MCG/DOSE AEPB Inhale 1 puff into the lungs 2 (two) times daily.       . furosemide (LASIX) 40 MG tablet Take 40 mg by mouth daily.       [name] HYDROcodone-acetaminophen (NORCO) 7.5-325 MG per tablet Take 1 tablet by mouth every 6 (six) hours as needed for moderate pain.      [name] levalbuterol (XOPENEX HFA) 45 MCG/ACT inhaler Inhale 2 puffs into the lungs every 4 (four) hours as needed for wheezing or shortness of breath.      . levalbuterol (XOPENEX) 0.63 MG/3ML nebulizer solution Take 3 mLs (0.63 mg total) by nebulization every 3 (three) hours as needed for wheezing or shortness of breath.  3 mL  0  . metFORMIN (GLUMETZA) 1000 MG (MOD) 24 hr tablet Take 1,000 mg by mouth 2 (two) times daily with a meal.      . methimazole (TAPAZOLE) 5 MG tablet Take 5 mg by mouth daily.      . metoprolol tartrate (LOPRESSOR) 25 MG tablet Take 3 tablets (75 mg total) by mouth 2 (two) times daily.  360 tablet  0  . omeprazole (PRILOSEC) 40 MG capsule Take 40 mg by mouth 2 (two) times daily.       . solifenacin (VESICARE) 10 MG tablet Take 10 mg by mouth daily.      [name] tiotropium (SPIRIVA) 18 MCG inhalation capsule Place 18 mcg into inhaler and inhale daily.       [name]  trimethoprim (TRIMPEX) 100 MG tablet Take 100 mg by mouth at bedtime.        Home: Home Living Family/patient expects to be discharged to:: Unsure Living Arrangements: Children;Spouse/significant other Available Help at Discharge: Family;Available 24 hours/day Type of Home: House Home Access: Stairs to enter [employer] of Steps: 4 Entrance Stairs-Rails: None Home Layout: One level Additional Comments: pt extremely poor historian, unable to answer questions. Per pt with spouse and children  Lives With: Spouse;Daughter;Other (Comment) (spouse , daughter, Wilburn Cornelia, and 52 yo grand daughter)  Functional History: Prior Function Comments: O2 at 2 liters at home pta  Functional Status:  Mobility: mod to max assist for basic bed mobility and transfers          ADL: max assist, incontinent    Cognition: Cognition Overall Cognitive Status: Impaired/Different from baseline Arousal/Alertness: Lethargic Orientation Level: Disoriented X4 Attention: Focused Focused Attention: Impaired Focused Attention Impairment: Verbal basic Memory: Impaired Awareness: Impaired Awareness Impairment: Intellectual impairment;Emergent impairment;Anticipatory impairment Problem Solving: Impaired Problem Solving Impairment: Verbal basic Executive Function: Self Correcting;Self Monitoring;Decision Making;Organizing Behaviors: Restless Safety/Judgment: Impaired Cognition Arousal/Alertness: Awake/alert Behavior During Therapy: Restless;Flat affect Overall Cognitive Status: Impaired/Different from baseline Area of Impairment: Attention;Memory;Safety/judgement;Following commands Orientation Level: Disoriented to;Place;Time;Situation Current Attention Level: Focused Memory:  (She did not know the names of family in room) Following Commands: Follows one step commands inconsistently Safety/Judgement: Decreased awareness of safety;Decreased awareness of deficits Awareness:  Intellectual Problem Solving: Slow processing;Decreased initiation;Difficulty sequencing;Requires verbal cues;Requires tactile cues General Comments: confabulating   Physical Exam: Blood pressure 158/72, pulse 81, temperature 98 F (36.7 C), temperature source Axillary, resp. rate 27, height $RemoveBe'5\' 5"'keLLHTBrn$  (1.651 m), weight 149 lb 0.5 oz (67.6 kg), SpO2 99.00%. Physical Exam Constitutional:  Right lying in bed incontinent with mitten on right hand and left hand HENT: oral mucosa pink and moist Head: Normocephalic. atraumatic Eyes:  Pupils reactive to light  Neck: Normal range of motion. Neck supple. No thyromegaly present.  Cardiovascular:  Cardiac rate controlled  Respiratory: Breath sounds normal. No respiratory distress.  GI: Soft. Bowel sounds are normal. She exhibits no distension.  Neurological:  Patient is but distracted. Right gaze preference. Inconsistently follow commands with limited attention. She did grimace to deep palpation on the left arm and leg. Left inattention. Did see some slight spontaneous movement of the left arm and leg.  Moves right arm spontaneously.  Left delt,bicep,tricep, hand, HF, KE, ADP/APF 0/5. No resting tone. Sensation 1/2 to LT, senses pain.  Skin: Skin is warm and dry.  Psychiatric:  Confused, non-agitated  Results for orders placed during the hospital encounter of 03/18/14 (from the past 48 hour(s))  GLUCOSE, CAPILLARY     Status: Abnormal   Collection Time    03/27/14  5:22 PM      Result Value Ref Range   Glucose-Capillary 130 (*) 70 - 99 mg/dL  GLUCOSE, CAPILLARY     Status: Abnormal   Collection Time    03/27/14  8:54 PM      Result Value Ref Range   Glucose-Capillary 131 (*) 70 - 99 mg/dL  GLUCOSE, CAPILLARY     Status: Abnormal   Collection Time    03/28/14 12:20 AM      Result Value Ref Range   Glucose-Capillary 102 (*) 70 - 99 mg/dL  BASIC METABOLIC PANEL     Status: Abnormal   Collection Time    03/28/14  3:34 AM      Result Value  Ref Range   Sodium 140  137 - 147 mEq/L   Potassium 3.9  3.7 - 5.3 mEq/L   Comment: DELTA CHECK NOTED   Chloride 92 (*) 96 - 112 mEq/L   CO2 36 (*) 19 - 32 mEq/L   Glucose, Bld 109 (*) 70 - 99 mg/dL   BUN 15  6 - 23 mg/dL   Creatinine, Ser 0.40 (*) 0.50 - 1.10 mg/dL   Calcium 9.3  8.4 - 10.5 mg/dL   GFR calc non Af Amer >90  >90 mL/min   GFR calc Af Amer >  90  >90 mL/min   Comment: (NOTE)     The eGFR has been calculated using the CKD EPI equation.     This calculation has not been validated in all clinical situations.     eGFR's persistently <90 mL/min signify possible Chronic Kidney     Disease.  GLUCOSE, CAPILLARY     Status: Abnormal   Collection Time    03/28/14  4:21 AM      Result Value Ref Range   Glucose-Capillary 107 (*) 70 - 99 mg/dL  GLUCOSE, CAPILLARY     Status: Abnormal   Collection Time    03/28/14  7:55 AM      Result Value Ref Range   Glucose-Capillary 124 (*) 70 - 99 mg/dL  GLUCOSE, CAPILLARY     Status: Abnormal   Collection Time    03/28/14 12:26 PM      Result Value Ref Range   Glucose-Capillary 148 (*) 70 - 99 mg/dL  GLUCOSE, CAPILLARY     Status: Abnormal   Collection Time    03/28/14  3:41 PM      Result Value Ref Range   Glucose-Capillary 146 (*) 70 - 99 mg/dL  GLUCOSE, CAPILLARY     Status: Abnormal   Collection Time    03/28/14  6:56 PM      Result Value Ref Range   Glucose-Capillary 133 (*) 70 - 99 mg/dL  GLUCOSE, CAPILLARY     Status: Abnormal   Collection Time    03/28/14  7:27 PM      Result Value Ref Range   Glucose-Capillary 138 (*) 70 - 99 mg/dL  GLUCOSE, CAPILLARY     Status: None   Collection Time    03/28/14 11:32 PM      Result Value Ref Range   Glucose-Capillary 97  70 - 99 mg/dL  COMPREHENSIVE METABOLIC PANEL     Status: Abnormal   Collection Time    03/29/14  3:14 AM      Result Value Ref Range   Sodium 141  137 - 147 mEq/L   Potassium 3.2 (*) 3.7 - 5.3 mEq/L   Comment: DELTA CHECK NOTED   Chloride 91 (*) 96 - 112  mEq/L   CO2 34 (*) 19 - 32 mEq/L   Glucose, Bld 107 (*) 70 - 99 mg/dL   BUN 15  6 - 23 mg/dL   Creatinine, Ser 0.46 (*) 0.50 - 1.10 mg/dL   Calcium 9.0  8.4 - 10.5 mg/dL   Total Protein 6.7  6.0 - 8.3 g/dL   Albumin 2.4 (*) 3.5 - 5.2 g/dL   AST 27  0 - 37 U/L   ALT 17  0 - 35 U/L   Alkaline Phosphatase 71  39 - 117 U/L   Total Bilirubin 0.6  0.3 - 1.2 mg/dL   GFR calc non Af Amer >90  >90 mL/min   GFR calc Af Amer >90  >90 mL/min   Comment: (NOTE)     The eGFR has been calculated using the CKD EPI equation.     This calculation has not been validated in all clinical situations.     eGFR's persistently <90 mL/min signify possible Chronic Kidney     Disease.  MAGNESIUM     Status: None   Collection Time    03/29/14  3:14 AM      Result Value Ref Range   Magnesium 1.9  1.5 - 2.5 mg/dL  CBC     Status: Abnormal  Collection Time    03/29/14  3:14 AM      Result Value Ref Range   WBC 12.2 (*) 4.0 - 10.5 K/uL   RBC 4.96  3.87 - 5.11 MIL/uL   Hemoglobin 11.1 (*) 12.0 - 15.0 g/dL   HCT 38.2  36.0 - 46.0 %   MCV 77.0 (*) 78.0 - 100.0 fL   MCH 22.4 (*) 26.0 - 34.0 pg   MCHC 29.1 (*) 30.0 - 36.0 g/dL   RDW 20.0 (*) 11.5 - 15.5 %   Platelets 208  150 - 400 K/uL  TSH     Status: None   Collection Time    03/29/14  3:14 AM      Result Value Ref Range   TSH 2.170  0.350 - 4.500 uIU/mL   Comment: Please note change in reference range.  GLUCOSE, CAPILLARY     Status: Abnormal   Collection Time    03/29/14  4:01 AM      Result Value Ref Range   Glucose-Capillary 100 (*) 70 - 99 mg/dL  GLUCOSE, CAPILLARY     Status: None   Collection Time    03/29/14  8:25 AM      Result Value Ref Range   Glucose-Capillary 97  70 - 99 mg/dL   Comment 1 Notify RN     Ct Head Wo Contrast  03/27/2014   CLINICAL DATA:  Evaluate intracranial hemorrhage. Patient is not following commands well.  EXAM: CT HEAD WITHOUT CONTRAST  TECHNIQUE: Contiguous axial images were obtained from the base of the skull  through the vertex without contrast.  COMPARISON:  03/22/2014  FINDINGS: The intraparenchymal hemorrhage involving the right occipital and temporal lobes has slightly decreased in size. This hematoma measures 6.3 x 3.0 cm on sequence 2, image 14 and previously measured 6.6 x 3.4 cm. Again noted is vasogenic edema around the hematoma. The vasogenic edema in the right parietal lobe is more conspicuous and cannot exclude mild progression in this area. Again noted is blood within the right lateral ventricle but there are decreased blood products in the right anterior horn. There continues to be 3 mm of right to left midline shift. Stable sulci effacement in the right cerebral cortex. Left lateral ventricle size is unchanged. Again noted is low-density in the left occipital and parietal white matter.  Extensive mucosal disease in the maxillary sinuses bilaterally. Patient has a nasal tube. No acute bone abnormality.  IMPRESSION: The right intraparenchymal hematoma has slightly decreased in size. There continues to be vasogenic edema and no change in the midline shift. The vasogenic edema extending into the right parietal lobe is slightly more conspicuous and cannot exclude mild progression in this area.  No evidence for a new hemorrhage.   Electronically Signed   By: Markus Daft M.D.   On: 03/27/2014 12:34   Dg Swallowing Func-speech Pathology  03/28/2014   Katherene Ponto Deblois, CCC-SLP     03/28/2014  2:39 PM Objective Swallowing Evaluation: Modified Barium Swallowing Study   Patient Details  Name: SHAKORA NORDQUIST MRN: [id number] Date of Birth: 11/09/1951  Today's Date: 03/28/2014 Time: 56-1405 SLP Time Calculation (min): 20 min  Past Medical History:  Past Medical History  Diagnosis Date  . Hypertension   . Hiatal hernia   . Adenomatous polyp of colon   . Acute asthmatic bronchitis   . GERD (gastroesophageal reflux disease)     EGD neg 03/07/2008, but prev required dilation  . COPD (chronic obstructive pulmonary  disease)   . IBS (irritable bowel syndrome)     chronic  . Urinary incontinence   . Esophagitis   . Chronic gastritis   . Duodenitis without hemorrhage 10/01/2001  . Diverticulosis   . Personal history of colonic adenomas 02/05/2008        . Anemia   . Hyperlipidemia   . Decreased peripheral vision of right eye   . Pneumonia 2011  . Hyperthyroidism   . Type II diabetes mellitus   . Stroke     a. Hx of stroke and ministrokes ~ 2009. b. TIA 11/2013, dx with  AF at that time.  . Arthritis   . Depression   . On home oxygen therapy     "2L; suppose to use 24/7" (01/05/2014)  . PAF (paroxysmal atrial fibrillation)     a. Prior hx. b. Recurred 11/2013 in setting of possible TIA.  Lifelong Coumadin planned. Spont converted to NSR. EF 60-65% by  echo 11/2013, normal cors 2007, normal nuc 2011.   . Atrial flutter     a. Dx 12/2013. s/p DCCV. On anticoagulation.   Past Surgical History:  Past Surgical History  Procedure Laterality Date  . Colonoscopy    . Upper gi endoscopy    . Cholecystectomy  1980's  . Vaginal hysterectomy  1992  . Breast biopsy Left 1987    "milk gland"  . Cardioversion N/A 01/06/2014    Procedure: CARDIOVERSION;  Surgeon: Lelon Perla, MD;   Location: [place];  Service: Cardiovascular;  Laterality: N/A;   HPI:  63 y.o. female with a history of recent stroke on Wednesday who  was discharged from [facility] after having coumadin stopped  for some pethechial hemorrhage associated with the hemorrhage.   PMH:  HTN, hiatal hernia, GERD, COPD, esophagitis, chronic  gastritis, pna, CVA 2009.  CXR 3/20 no edema or consolidation.    02/14/14 right lower lobe infiltrate and effusion.  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 Intra-axial  hematoma with estimated volume of 53 mm, plus secondary extension  of hemorrhage into the right lateral ventricle.  Repeat CT 3/21  No new intracranial hemorrhage or infarct identified.   No edema  or  consolidation.      Assessment / Plan / Recommendation Clinical Impression  Dysphagia Diagnosis: Severe oral phase dysphagia;Severe  pharyngeal phase dysphagia Clinical impression: Pt demonstrates a severe oral and  oropahryngeal dysphagia due to cognitive deficits and delayed  swallow reponse. Pt orally holds bolus, sometimes with premature  spillage to the pyriform sinuses; several seconds passes before a  swallow is initiated. With boluses any larger than teaspoon size,  pt is likely to aspirate before the swallow. Recommend Dys 1  (puree) diet and honey thick liquids given by teaspoon only. SLP  will f/u for tolerance. As pts swallow trigger becomes more  consistent and timly pt may upgrade to nectar.     Treatment Recommendation  Therapy as outlined in treatment plan below    Diet Recommendation Dysphagia 1 (Puree);Honey-thick liquid   Liquid Administration via: Spoon Medication Administration: Crushed with puree Supervision: Trained caregiver to feed patient;Full  supervision/cueing for compensatory strategies Compensations: Slow rate;Small sips/bites Postural Changes and/or Swallow Maneuvers: Seated upright 90  degrees    Other  Recommendations Oral Care Recommendations: Oral care BID Other Recommendations: Order thickener from pharmacy   Follow Up Recommendations  Inpatient Rehab    Frequency and Duration min 2x/week  2 weeks   Pertinent Vitals/Pain NA    SLP Swallow Goals     General HPI: 63 y.o. female with a history of recent stroke on  Wednesday who was discharged from [facility] after having  coumadin stopped for some pethechial hemorrhage associated with  the hemorrhage.  PMH:  HTN, hiatal hernia, GERD, COPD,  esophagitis, chronic gastritis, pna, CVA 2009.  CXR 3/20 no edema  or consolidation.   02/14/14 right lower lobe infiltrate and  effusion.  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 Intra-axial hematoma with  estimated volume of 53 mm,  plus secondary extension of hemorrhage into the right lateral  ventricle.  Repeat CT 3/21 No new intracranial hemorrhage or  infarct identified.   No edema or consolidation.  Type of Study: Modified Barium Swallowing Study Reason for Referral: Objectively evaluate swallowing function Previous Swallow Assessment: MBS 3/21 Diet Prior to this Study: NPO Temperature Spikes Noted: No Respiratory Status: Nasal cannula History of Recent Intubation: No Behavior/Cognition: Impulsive;Requires  cueing;Distractible;Decreased sustained attention;Confused Oral Cavity - Dentition: Missing dentition;Edentulous Oral Motor / Sensory Function: Impaired - see Bedside swallow  eval Self-Feeding Abilities: Total assist Patient Positioning: Postural control interferes with function Baseline Vocal Quality: Clear;Low vocal intensity Volitional Cough: Cognitively unable to elicit Volitional Swallow: Unable to elicit Anatomy: Within functional limits Pharyngeal Secretions: Not observed secondary MBS    Reason for Referral Objectively evaluate swallowing function   Oral Phase Oral Preparation/Oral Phase Oral Phase: Impaired Oral - Honey Oral - Honey Teaspoon: Delayed oral transit;Holding of  bolus;Reduced posterior propulsion Oral - Nectar Oral - Nectar Teaspoon: Delayed oral transit;Holding of  bolus;Reduced posterior propulsion Oral - Nectar Cup: Delayed oral transit;Holding of bolus;Reduced  posterior propulsion Oral - Solids Oral - Puree: Delayed oral transit;Holding of bolus;Reduced  posterior propulsion   Pharyngeal Phase Pharyngeal Phase Pharyngeal Phase: Impaired Pharyngeal - Honey Pharyngeal - Honey Teaspoon: Premature spillage to pyriform  sinuses;Delayed swallow initiation Penetration/Aspiration details (honey teaspoon): Material does  not enter airway Pharyngeal - Nectar Pharyngeal - Nectar Teaspoon: Delayed swallow  initiation;Premature spillage to pyriform sinuses Pharyngeal - Nectar Cup: Not tested  Pharyngeal - Thin Pharyngeal - Thin Teaspoon: Not tested Pharyngeal - Thin Cup: Not tested Pharyngeal - Solids Pharyngeal - Puree: Delayed swallow initiation  Cervical Esophageal Phase    GO             Harlon Ditty, MA CCC-SLP [phone]  Claudine Mouton 03/28/2014, 2:25 PM     Post Admission Physician Evaluation: 1. Functional deficits secondary  To likely embolic right temporal occipital infarct with hemorrhagic transformation. 2. Patient is admitted to receive collaborative, interdisciplinary care between the physiatrist, rehab nursing staff, and therapy team. 3. Patient's level of medical complexity and substantial therapy needs in context of that medical necessity cannot be provided at a lesser intensity of care such as a SNF. 4. Patient has experienced substantial functional loss from his/her baseline which was documented above under the "Functional History" and "Functional Status" headings.  Judging by the patient's diagnosis, physical exam, and functional history, the patient has potential for functional progress which will result in measurable gains while on inpatient rehab.  These gains will be of substantial and practical use upon discharge  in facilitating mobility and self-care at the household level. 5. Physiatrist will provide 24 hour management of medical needs as well as oversight of the therapy plan/treatment and provide guidance as appropriate regarding the interaction  of the two. 6. 24 hour rehab nursing will assist with bladder management, bowel management, safety, skin/wound care, disease management, medication administration, pain management and patient education  and help integrate therapy concepts, techniques,education, etc. 7. PT will assess and treat for/with: Lower extremity strength, range of motion, stamina, balance, functional mobility, safety, adaptive techniques and equipment, NMR, visual spatial and cognitive perceptual awareness, pain mgt, education, ego-support.    Goals are: min assist. 8. OT will assess and treat for/with: ADL's, functional mobility, safety, upper extremity strength, adaptive techniques and equipment, NMR, visual spatial/cognitive perceptual awareness, pain mgt, education.   Goals are: min to mod assist. 9. SLP will assess and treat for/with: speech, swallowing, cognition.  Goals are: supervision to min/mod assist. 10. Case Management and Social Worker will assess and treat for psychological issues and discharge planning. 11. Team conference will be held weekly to assess progress toward goals and to determine barriers to discharge. 12. Patient will receive at least 3 hours of therapy per day at least 5 days per week. 13. ELOS: 24-28 days       14. Prognosis:  excellent   Medical Problem List and Plan: 1. 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 Plan repeat cranial CT scan 1 week if hemorrhage resolving consider anticoagulation 2. DVT Prophylaxis/Anticoagulation: SCDs. Monitor for any signs of DVT--needs LE venous dopplers 3. Pain Management: Tylenol as needed 4. Mood/anxiety: Xanax 0.25 mg each bedtime 5. Neuropsych: This patient is not capable of making decisions on her own behalf. 6. Dysphagia. Dysphagia 1 honey thick liquids. May need nighttime fluids for hydration. Followup speech therapy 7. Atrial fibrillation. No Coumadin therapy secondary to hemorrhage. Cardiac rate control. Continue Lanoxin 0.125 mg daily and Cardizem 10 mg 4 times a day as advised as well as Lopressor 100 mg 3 times a day 8. Seizure disorder. Valproate 500 mg every 8 hours. Monitor for any further seizure activity 9.HCAP. Complete 7 day course of empiric Zosyn initiated 03/24/2014 10. COPD. Patient with home oxygen. Continue Pulmicort nebulizer. Check oxygen saturations every shift. 11. Type 2 diabetes mellitus. Continue sliding scale for now. Check blood sugars as directed  Meredith Staggers, MD, [facility]  03/29/2014

## 2014-03-29 NOTE — H&P (Signed)
Physical Medicine and Rehabilitation Admission H&P    Chief Complaint  Patient presents with  . Code Stroke  :  Chief complaint; weakness  HPI: Colleen [name] is a 63 y.o. right-handed female with history of COPD, type 2 diabetes mellitus, hypertension and atrial fibrillation with Coumadin therapy. Patient with recent right occipital infarct on Wednesday he 03/16/2014 was seen at [facility] following a CT showed petechial hemorrhage. She was discharged to home after stopping her Coumadin. She presented to [facility] 03/18/2014 after developing severe headache with nausea vomiting and left-sided weakness. CT and imaging showed right temporal occipital region infarct with intraventricular extension into the right lateral ventricle with cerebral edema and left midline shift. She was not a TPA candidate. Noted INR on admission of 2.61 it was reversed. Followup cranial CT scan 03/27/2014 shows slight decrease in right intraparenchymal hematoma. Plan was to followup CT scan in one week if hemorrhage continues to resolve may consider anticoagulation. Echocardiogram with ejection fraction of 60% no wall motion abnormalities. Recent carotid Dopplers with no ICA stenosis. 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 Followup neurology services maintained on valproate 500 mg every 8 hours.HCAP and maintained on empiric Zosyn x7 days initiated 03/24/2014. Panda tube was placed for for nutritional support however patient pulled out the tube 03/28/2014.. followup speech therapy placed on dysphagia 1 honey thick liquid diet. Physical therapy evaluation completed 03/21/2014 with recommendations for physical medicine rehabilitation consult. Patient was admitted for comprehensive rehabilitation program   ROS Review of Systems  Unable to perform ROS: mental acuity   Past Medical History  Diagnosis Date  . Hypertension   . Hiatal  hernia   . Adenomatous polyp of colon   . Acute asthmatic bronchitis   . GERD (gastroesophageal reflux disease)     EGD neg 03/07/2008, but prev required dilation  . COPD (chronic obstructive pulmonary disease)   . IBS (irritable bowel syndrome)     chronic  . Urinary incontinence   . Esophagitis   . Chronic gastritis   . Duodenitis without hemorrhage 10/01/2001  . Diverticulosis   . Personal history of colonic adenomas 02/05/2008        . Anemia   . Hyperlipidemia   . Decreased peripheral vision of right eye   . Pneumonia 2011  . Hyperthyroidism   . Type II diabetes mellitus   . Stroke     a. Hx of stroke and ministrokes ~ 2009. b. TIA 11/2013, dx with AF at that time.  . Arthritis   . Depression   . On home oxygen therapy     "2L; suppose to use 24/7" (01/05/2014)  . PAF (paroxysmal atrial fibrillation)     a. Prior hx. b. Recurred 11/2013 in setting of possible TIA. Lifelong Coumadin planned. Spont converted to NSR. EF 60-65% by echo 11/2013, normal cors 2007, normal nuc 2011.   . Atrial flutter     a. Dx 12/2013. s/p DCCV. On anticoagulation.   Past Surgical History  Procedure Laterality Date  . Colonoscopy    . Upper gi endoscopy    . Cholecystectomy  1980's  . Vaginal hysterectomy  1992  . Breast biopsy Left 1987    "milk gland"  . Cardioversion N/A 01/06/2014    Procedure: CARDIOVERSION;  Surgeon: Lelon Perla, MD;  Location: [facility] OR;  Service: Cardiovascular;  Laterality: N/A;   Family History  Problem Relation Age of Onset  . Heart disease  Mother   . Asthma Father   . Lung cancer Father   . Asthma Sister   . Heart disease Sister   . Colon cancer Father    Social History:  reports that she has been smoking Cigarettes.  She has a 51 pack-year smoking history. She has never used smokeless tobacco. She reports that she does not drink alcohol or use illicit drugs. Allergies:  Allergies  Allergen Reactions  . Crestor [Rosuvastatin]   . Latex Hives  . Wellbutrin  [Bupropion]    Medications Prior to Admission  Medication Sig Dispense Refill  . ALPRAZolam (XANAX) 0.5 MG tablet Take 0.5 mg by mouth at bedtime.       [name] atorvastatin (LIPITOR) 20 MG tablet Take 1 tablet (20 mg total) by mouth every evening.  30 tablet  6  . cetirizine (ZYRTEC) 10 MG tablet Take 10 mg by mouth at bedtime.       . digoxin (LANOXIN) 0.25 MG tablet Take 0.5 tablets (0.125 mg total) by mouth daily.  30 tablet  0  . diltiazem (CARDIZEM CD) 360 MG 24 hr capsule Take 1 capsule (360 mg total) by mouth daily.  30 capsule  0  . DULoxetine (CYMBALTA) 60 MG capsule Take 120 mg by mouth daily.      . ferrous sulfate 325 (65 FE) MG tablet Take 325 mg by mouth 2 (two) times daily with a meal.      . Fluticasone-Salmeterol (ADVAIR DISKUS) 250-50 MCG/DOSE AEPB Inhale 1 puff into the lungs 2 (two) times daily.       . furosemide (LASIX) 40 MG tablet Take 40 mg by mouth daily.       [name] HYDROcodone-acetaminophen (NORCO) 7.5-325 MG per tablet Take 1 tablet by mouth every 6 (six) hours as needed for moderate pain.      [name] levalbuterol (XOPENEX HFA) 45 MCG/ACT inhaler Inhale 2 puffs into the lungs every 4 (four) hours as needed for wheezing or shortness of breath.      . levalbuterol (XOPENEX) 0.63 MG/3ML nebulizer solution Take 3 mLs (0.63 mg total) by nebulization every 3 (three) hours as needed for wheezing or shortness of breath.  3 mL  0  . metFORMIN (GLUMETZA) 1000 MG (MOD) 24 hr tablet Take 1,000 mg by mouth 2 (two) times daily with a meal.      . methimazole (TAPAZOLE) 5 MG tablet Take 5 mg by mouth daily.      . metoprolol tartrate (LOPRESSOR) 25 MG tablet Take 3 tablets (75 mg total) by mouth 2 (two) times daily.  360 tablet  0  . omeprazole (PRILOSEC) 40 MG capsule Take 40 mg by mouth 2 (two) times daily.       . solifenacin (VESICARE) 10 MG tablet Take 10 mg by mouth daily.      [name] tiotropium (SPIRIVA) 18 MCG inhalation capsule Place 18 mcg into inhaler and inhale daily.       [name]  trimethoprim (TRIMPEX) 100 MG tablet Take 100 mg by mouth at bedtime.        Home: Home Living Family/patient expects to be discharged to:: Unsure Living Arrangements: Children;Spouse/significant other Available Help at Discharge: Family;Available 24 hours/day Type of Home: House Home Access: Stairs to enter [employer] of Steps: 4 Entrance Stairs-Rails: None Home Layout: One level Additional Comments: pt extremely poor historian, unable to answer questions. Per pt with spouse and children  Lives With: Spouse;Daughter;Other (Comment) (spouse , daughter, Wilburn Cornelia, and 52 yo grand daughter)  Functional History: Prior Function Comments: O2 at 2 liters at home pta  Functional Status:  Mobility: mod to max assist for basic bed mobility and transfers          ADL: max assist, incontinent    Cognition: Cognition Overall Cognitive Status: Impaired/Different from baseline Arousal/Alertness: Lethargic Orientation Level: Disoriented X4 Attention: Focused Focused Attention: Impaired Focused Attention Impairment: Verbal basic Memory: Impaired Awareness: Impaired Awareness Impairment: Intellectual impairment;Emergent impairment;Anticipatory impairment Problem Solving: Impaired Problem Solving Impairment: Verbal basic Executive Function: Self Correcting;Self Monitoring;Decision Making;Organizing Behaviors: Restless Safety/Judgment: Impaired Cognition Arousal/Alertness: Awake/alert Behavior During Therapy: Restless;Flat affect Overall Cognitive Status: Impaired/Different from baseline Area of Impairment: Attention;Memory;Safety/judgement;Following commands Orientation Level: Disoriented to;Place;Time;Situation Current Attention Level: Focused Memory:  (She did not know the names of family in room) Following Commands: Follows one step commands inconsistently Safety/Judgement: Decreased awareness of safety;Decreased awareness of deficits Awareness:  Intellectual Problem Solving: Slow processing;Decreased initiation;Difficulty sequencing;Requires verbal cues;Requires tactile cues General Comments: confabulating   Physical Exam: Blood pressure 158/72, pulse 81, temperature 98 F (36.7 C), temperature source Axillary, resp. rate 27, height $RemoveBe'5\' 5"'fcBkIPhkc$  (1.651 m), weight 149 lb 0.5 oz (67.6 kg), SpO2 99.00%. Physical Exam Constitutional:  Right lying in bed incontinent with mitten on right hand and left hand HENT: oral mucosa pink and moist Head: Normocephalic. atraumatic Eyes:  Pupils reactive to light  Neck: Normal range of motion. Neck supple. No thyromegaly present.  Cardiovascular:  Cardiac rate controlled  Respiratory: Breath sounds normal. No respiratory distress.  GI: Soft. Bowel sounds are normal. She exhibits no distension.  Neurological:  Patient is but distracted. Right gaze preference. Inconsistently follow commands with limited attention. She did grimace to deep palpation on the left arm and leg. Left inattention. Did see some slight spontaneous movement of the left arm and leg.  Moves right arm spontaneously.  Left delt,bicep,tricep, hand, HF, KE, ADP/APF 0/5. No resting tone. Sensation 1/2 to LT, senses pain.  Skin: Skin is warm and dry.  Psychiatric:  Confused, non-agitated  Results for orders placed during the hospital encounter of 03/18/14 (from the past 48 hour(s))  GLUCOSE, CAPILLARY     Status: Abnormal   Collection Time    03/27/14  5:22 PM      Result Value Ref Range   Glucose-Capillary 130 (*) 70 - 99 mg/dL  GLUCOSE, CAPILLARY     Status: Abnormal   Collection Time    03/27/14  8:54 PM      Result Value Ref Range   Glucose-Capillary 131 (*) 70 - 99 mg/dL  GLUCOSE, CAPILLARY     Status: Abnormal   Collection Time    03/28/14 12:20 AM      Result Value Ref Range   Glucose-Capillary 102 (*) 70 - 99 mg/dL  BASIC METABOLIC PANEL     Status: Abnormal   Collection Time    03/28/14  3:34 AM      Result Value  Ref Range   Sodium 140  137 - 147 mEq/L   Potassium 3.9  3.7 - 5.3 mEq/L   Comment: DELTA CHECK NOTED   Chloride 92 (*) 96 - 112 mEq/L   CO2 36 (*) 19 - 32 mEq/L   Glucose, Bld 109 (*) 70 - 99 mg/dL   BUN 15  6 - 23 mg/dL   Creatinine, Ser 0.40 (*) 0.50 - 1.10 mg/dL   Calcium 9.3  8.4 - 10.5 mg/dL   GFR calc non Af Amer >90  >90 mL/min   GFR calc Af Amer >  90  >90 mL/min   Comment: (NOTE)     The eGFR has been calculated using the CKD EPI equation.     This calculation has not been validated in all clinical situations.     eGFR's persistently <90 mL/min signify possible Chronic Kidney     Disease.  GLUCOSE, CAPILLARY     Status: Abnormal   Collection Time    03/28/14  4:21 AM      Result Value Ref Range   Glucose-Capillary 107 (*) 70 - 99 mg/dL  GLUCOSE, CAPILLARY     Status: Abnormal   Collection Time    03/28/14  7:55 AM      Result Value Ref Range   Glucose-Capillary 124 (*) 70 - 99 mg/dL  GLUCOSE, CAPILLARY     Status: Abnormal   Collection Time    03/28/14 12:26 PM      Result Value Ref Range   Glucose-Capillary 148 (*) 70 - 99 mg/dL  GLUCOSE, CAPILLARY     Status: Abnormal   Collection Time    03/28/14  3:41 PM      Result Value Ref Range   Glucose-Capillary 146 (*) 70 - 99 mg/dL  GLUCOSE, CAPILLARY     Status: Abnormal   Collection Time    03/28/14  6:56 PM      Result Value Ref Range   Glucose-Capillary 133 (*) 70 - 99 mg/dL  GLUCOSE, CAPILLARY     Status: Abnormal   Collection Time    03/28/14  7:27 PM      Result Value Ref Range   Glucose-Capillary 138 (*) 70 - 99 mg/dL  GLUCOSE, CAPILLARY     Status: None   Collection Time    03/28/14 11:32 PM      Result Value Ref Range   Glucose-Capillary 97  70 - 99 mg/dL  COMPREHENSIVE METABOLIC PANEL     Status: Abnormal   Collection Time    03/29/14  3:14 AM      Result Value Ref Range   Sodium 141  137 - 147 mEq/L   Potassium 3.2 (*) 3.7 - 5.3 mEq/L   Comment: DELTA CHECK NOTED   Chloride 91 (*) 96 - 112  mEq/L   CO2 34 (*) 19 - 32 mEq/L   Glucose, Bld 107 (*) 70 - 99 mg/dL   BUN 15  6 - 23 mg/dL   Creatinine, Ser 0.46 (*) 0.50 - 1.10 mg/dL   Calcium 9.0  8.4 - 10.5 mg/dL   Total Protein 6.7  6.0 - 8.3 g/dL   Albumin 2.4 (*) 3.5 - 5.2 g/dL   AST 27  0 - 37 U/L   ALT 17  0 - 35 U/L   Alkaline Phosphatase 71  39 - 117 U/L   Total Bilirubin 0.6  0.3 - 1.2 mg/dL   GFR calc non Af Amer >90  >90 mL/min   GFR calc Af Amer >90  >90 mL/min   Comment: (NOTE)     The eGFR has been calculated using the CKD EPI equation.     This calculation has not been validated in all clinical situations.     eGFR's persistently <90 mL/min signify possible Chronic Kidney     Disease.  MAGNESIUM     Status: None   Collection Time    03/29/14  3:14 AM      Result Value Ref Range   Magnesium 1.9  1.5 - 2.5 mg/dL  CBC     Status: Abnormal  Collection Time    03/29/14  3:14 AM      Result Value Ref Range   WBC 12.2 (*) 4.0 - 10.5 K/uL   RBC 4.96  3.87 - 5.11 MIL/uL   Hemoglobin 11.1 (*) 12.0 - 15.0 g/dL   HCT 38.2  36.0 - 46.0 %   MCV 77.0 (*) 78.0 - 100.0 fL   MCH 22.4 (*) 26.0 - 34.0 pg   MCHC 29.1 (*) 30.0 - 36.0 g/dL   RDW 20.0 (*) 11.5 - 15.5 %   Platelets 208  150 - 400 K/uL  TSH     Status: None   Collection Time    03/29/14  3:14 AM      Result Value Ref Range   TSH 2.170  0.350 - 4.500 uIU/mL   Comment: Please note change in reference range.  GLUCOSE, CAPILLARY     Status: Abnormal   Collection Time    03/29/14  4:01 AM      Result Value Ref Range   Glucose-Capillary 100 (*) 70 - 99 mg/dL  GLUCOSE, CAPILLARY     Status: None   Collection Time    03/29/14  8:25 AM      Result Value Ref Range   Glucose-Capillary 97  70 - 99 mg/dL   Comment 1 Notify RN     Ct Head Wo Contrast  03/27/2014   CLINICAL DATA:  Evaluate intracranial hemorrhage. Patient is not following commands well.  EXAM: CT HEAD WITHOUT CONTRAST  TECHNIQUE: Contiguous axial images were obtained from the base of the skull  through the vertex without contrast.  COMPARISON:  03/22/2014  FINDINGS: The intraparenchymal hemorrhage involving the right occipital and temporal lobes has slightly decreased in size. This hematoma measures 6.3 x 3.0 cm on sequence 2, image 14 and previously measured 6.6 x 3.4 cm. Again noted is vasogenic edema around the hematoma. The vasogenic edema in the right parietal lobe is more conspicuous and cannot exclude mild progression in this area. Again noted is blood within the right lateral ventricle but there are decreased blood products in the right anterior horn. There continues to be 3 mm of right to left midline shift. Stable sulci effacement in the right cerebral cortex. Left lateral ventricle size is unchanged. Again noted is low-density in the left occipital and parietal white matter.  Extensive mucosal disease in the maxillary sinuses bilaterally. Patient has a nasal tube. No acute bone abnormality.  IMPRESSION: The right intraparenchymal hematoma has slightly decreased in size. There continues to be vasogenic edema and no change in the midline shift. The vasogenic edema extending into the right parietal lobe is slightly more conspicuous and cannot exclude mild progression in this area.  No evidence for a new hemorrhage.   Electronically Signed   By: Markus Daft M.D.   On: 03/27/2014 12:34   Dg Swallowing Func-speech Pathology  03/28/2014   Katherene Ponto Deblois, CCC-SLP     03/28/2014  2:39 PM Objective Swallowing Evaluation: Modified Barium Swallowing Study   Patient Details  Name: THRESIA RAMANATHAN MRN: [id number] Date of Birth: 11-25-1951  Today's Date: 03/28/2014 Time: 10-1404 SLP Time Calculation (min): 20 min  Past Medical History:  Past Medical History  Diagnosis Date  . Hypertension   . Hiatal hernia   . Adenomatous polyp of colon   . Acute asthmatic bronchitis   . GERD (gastroesophageal reflux disease)     EGD neg 03/07/2008, but prev required dilation  . COPD (chronic obstructive pulmonary  disease)   . IBS (irritable bowel syndrome)     chronic  . Urinary incontinence   . Esophagitis   . Chronic gastritis   . Duodenitis without hemorrhage 10/01/2001  . Diverticulosis   . Personal history of colonic adenomas 02/05/2008        . Anemia   . Hyperlipidemia   . Decreased peripheral vision of right eye   . Pneumonia 2011  . Hyperthyroidism   . Type II diabetes mellitus   . Stroke     a. Hx of stroke and ministrokes ~ 2009. b. TIA 11/2013, dx with  AF at that time.  . Arthritis   . Depression   . On home oxygen therapy     "2L; suppose to use 24/7" (01/05/2014)  . PAF (paroxysmal atrial fibrillation)     a. Prior hx. b. Recurred 11/2013 in setting of possible TIA.  Lifelong Coumadin planned. Spont converted to NSR. EF 60-65% by  echo 11/2013, normal cors 2007, normal nuc 2011.   . Atrial flutter     a. Dx 12/2013. s/p DCCV. On anticoagulation.   Past Surgical History:  Past Surgical History  Procedure Laterality Date  . Colonoscopy    . Upper gi endoscopy    . Cholecystectomy  1980's  . Vaginal hysterectomy  1992  . Breast biopsy Left 1987    "milk gland"  . Cardioversion N/A 01/06/2014    Procedure: CARDIOVERSION;  Surgeon: Lelon Perla, MD;   Location: [place];  Service: Cardiovascular;  Laterality: N/A;   HPI:  63 y.o. female with a history of recent stroke on Wednesday who  was discharged from [facility] after having coumadin stopped  for some pethechial hemorrhage associated with the hemorrhage.   PMH:  HTN, hiatal hernia, GERD, COPD, esophagitis, chronic  gastritis, pna, CVA 2009.  CXR 3/20 no edema or consolidation.    02/14/14 right lower lobe infiltrate and effusion.  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 Intra-axial  hematoma with estimated volume of 53 mm, plus secondary extension  of hemorrhage into the right lateral ventricle.  Repeat CT 3/21  No new intracranial hemorrhage or infarct identified.   No edema  or  consolidation.      Assessment / Plan / Recommendation Clinical Impression  Dysphagia Diagnosis: Severe oral phase dysphagia;Severe  pharyngeal phase dysphagia Clinical impression: Pt demonstrates a severe oral and  oropahryngeal dysphagia due to cognitive deficits and delayed  swallow reponse. Pt orally holds bolus, sometimes with premature  spillage to the pyriform sinuses; several seconds passes before a  swallow is initiated. With boluses any larger than teaspoon size,  pt is likely to aspirate before the swallow. Recommend Dys 1  (puree) diet and honey thick liquids given by teaspoon only. SLP  will f/u for tolerance. As pts swallow trigger becomes more  consistent and timly pt may upgrade to nectar.     Treatment Recommendation  Therapy as outlined in treatment plan below    Diet Recommendation Dysphagia 1 (Puree);Honey-thick liquid   Liquid Administration via: Spoon Medication Administration: Crushed with puree Supervision: Trained caregiver to feed patient;Full  supervision/cueing for compensatory strategies Compensations: Slow rate;Small sips/bites Postural Changes and/or Swallow Maneuvers: Seated upright 90  degrees    Other  Recommendations Oral Care Recommendations: Oral care BID Other Recommendations: Order thickener from pharmacy   Follow Up Recommendations  Inpatient Rehab    Frequency and Duration min 2x/week  2 weeks   Pertinent Vitals/Pain NA    SLP Swallow Goals     General HPI: 63 y.o. female with a history of recent stroke on  Wednesday who was discharged from [facility] after having  coumadin stopped for some pethechial hemorrhage associated with  the hemorrhage.  PMH:  HTN, hiatal hernia, GERD, COPD,  esophagitis, chronic gastritis, pna, CVA 2009.  CXR 3/20 no edema  or consolidation.   02/14/14 right lower lobe infiltrate and  effusion.  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 Intra-axial hematoma with  estimated volume of 53 mm,  plus secondary extension of hemorrhage into the right lateral  ventricle.  Repeat CT 3/21 No new intracranial hemorrhage or  infarct identified.   No edema or consolidation.  Type of Study: Modified Barium Swallowing Study Reason for Referral: Objectively evaluate swallowing function Previous Swallow Assessment: MBS 3/21 Diet Prior to this Study: NPO Temperature Spikes Noted: No Respiratory Status: Nasal cannula History of Recent Intubation: No Behavior/Cognition: Impulsive;Requires  cueing;Distractible;Decreased sustained attention;Confused Oral Cavity - Dentition: Missing dentition;Edentulous Oral Motor / Sensory Function: Impaired - see Bedside swallow  eval Self-Feeding Abilities: Total assist Patient Positioning: Postural control interferes with function Baseline Vocal Quality: Clear;Low vocal intensity Volitional Cough: Cognitively unable to elicit Volitional Swallow: Unable to elicit Anatomy: Within functional limits Pharyngeal Secretions: Not observed secondary MBS    Reason for Referral Objectively evaluate swallowing function   Oral Phase Oral Preparation/Oral Phase Oral Phase: Impaired Oral - Honey Oral - Honey Teaspoon: Delayed oral transit;Holding of  bolus;Reduced posterior propulsion Oral - Nectar Oral - Nectar Teaspoon: Delayed oral transit;Holding of  bolus;Reduced posterior propulsion Oral - Nectar Cup: Delayed oral transit;Holding of bolus;Reduced  posterior propulsion Oral - Solids Oral - Puree: Delayed oral transit;Holding of bolus;Reduced  posterior propulsion   Pharyngeal Phase Pharyngeal Phase Pharyngeal Phase: Impaired Pharyngeal - Honey Pharyngeal - Honey Teaspoon: Premature spillage to pyriform  sinuses;Delayed swallow initiation Penetration/Aspiration details (honey teaspoon): Material does  not enter airway Pharyngeal - Nectar Pharyngeal - Nectar Teaspoon: Delayed swallow  initiation;Premature spillage to pyriform sinuses Pharyngeal - Nectar Cup: Not tested  Pharyngeal - Thin Pharyngeal - Thin Teaspoon: Not tested Pharyngeal - Thin Cup: Not tested Pharyngeal - Solids Pharyngeal - Puree: Delayed swallow initiation  Cervical Esophageal Phase    GO             Herbie Baltimore, MA CCC-SLP [phone]  Lynann Beaver 03/28/2014, 2:25 PM     Post Admission Physician Evaluation: 1. Functional deficits secondary  To likely embolic right temporal occipital infarct with hemorrhagic transformation. 2. Patient is admitted to receive collaborative, interdisciplinary care between the physiatrist, rehab nursing staff, and therapy team. 3. Patient's level of medical complexity and substantial therapy needs in context of that medical necessity cannot be provided at a lesser intensity of care such as a SNF. 4. Patient has experienced substantial functional loss from his/her baseline which was documented above under the "Functional History" and "Functional Status" headings.  Judging by the patient's diagnosis, physical exam, and functional history, the patient has potential for functional progress which will result in measurable gains while on inpatient rehab.  These gains will be of substantial and practical use upon discharge  in facilitating mobility and self-care at the household level. 5. Physiatrist will provide 24 hour management of medical needs as well as oversight of the therapy plan/treatment and provide guidance as appropriate regarding the interaction  of the two. 6. 24 hour rehab nursing will assist with bladder management, bowel management, safety, skin/wound care, disease management, medication administration, pain management and patient education  and help integrate therapy concepts, techniques,education, etc. 7. PT will assess and treat for/with: Lower extremity strength, range of motion, stamina, balance, functional mobility, safety, adaptive techniques and equipment, NMR, visual spatial and cognitive perceptual awareness, pain mgt, education, ego-support.    Goals are: min assist. 8. OT will assess and treat for/with: ADL's, functional mobility, safety, upper extremity strength, adaptive techniques and equipment, NMR, visual spatial/cognitive perceptual awareness, pain mgt, education.   Goals are: min to mod assist. 9. SLP will assess and treat for/with: speech, swallowing, cognition.  Goals are: supervision to min/mod assist. 10. Case Management and Social Worker will assess and treat for psychological issues and discharge planning. 11. Team conference will be held weekly to assess progress toward goals and to determine barriers to discharge. 12. Patient will receive at least 3 hours of therapy per day at least 5 days per week. 13. ELOS: 24-28 days       14. Prognosis:  excellent   Medical Problem List and Plan: 1. 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 Plan repeat cranial CT scan 1 week if hemorrhage resolving consider anticoagulation 2. DVT Prophylaxis/Anticoagulation: SCDs. Monitor for any signs of DVT--needs LE venous dopplers 3. Pain Management: Tylenol as needed 4. Mood/anxiety: Xanax 0.25 mg each bedtime 5. Neuropsych: This patient is not capable of making decisions on her own behalf. 6. Dysphagia. Dysphagia 1 honey thick liquids. May need nighttime fluids for hydration. Followup speech therapy 7. Atrial fibrillation. No Coumadin therapy secondary to hemorrhage. Cardiac rate control. Continue Lanoxin 0.125 mg daily and Cardizem 10 mg 4 times a day as advised as well as Lopressor 100 mg 3 times a day 8. Seizure disorder. Valproate 500 mg every 8 hours. Monitor for any further seizure activity 9.HCAP. Complete 7 day course of empiric Zosyn initiated 03/24/2014 10. COPD. Patient with home oxygen. Continue Pulmicort nebulizer. Check oxygen saturations every shift. 11. Type 2 diabetes mellitus. Continue sliding scale for now. Check blood sugars as directed  Meredith Staggers, MD, [facility]  03/29/2014

## 2014-03-29 NOTE — Progress Notes (Signed)
Stroke Team Progress Note  HISTORY 62 y.o. female with a history of recent right occipital infarct on Wednesday 03/16/2014 who was seen at [facility] following a fall where a CT showed pethechial hemorrhage, she was d/c home after stopping her coumadin which she takes for atrial fibrillation. On 03/18/2014 she developed severe headache with nausea and vomiting and was brought to the ED where she had left-sided weakness and was found to have right temporal-occipital region with intraventricular extension into the right lateral ventricle. She was not a TPA candidate secondary to stroke with in the past 90 days and hemorrhage. Neurosurgeon consulted (Dr. Ronnald Ramp) felt no surgical intervention was necessary. She was admitted to the neuro ICU for further treatment and evaluation.  SUBJECTIVE No family at bedside. Pt was able to pass swallow eval yesterday.  OBJECTIVE Most recent Vital Signs: Filed Vitals:   03/28/14 2342 03/29/14 0300 03/29/14 0823 03/29/14 0919  BP:   159/81   Pulse: 85  80   Temp:  98.5 F (36.9 C) 97.9 F (36.6 C)   TempSrc:  Axillary Axillary   Resp:   24   Height:      Weight:  67.6 kg (149 lb 0.5 oz)    SpO2:   100% 100%   CBG (last 3)   Recent Labs  03/28/14 2332 03/29/14 0401 03/29/14 0825  GLUCAP 97 100* 97    IV Fluid Intake:      MEDICATIONS  . ALPRAZolam  0.25 mg Oral QHS  . antiseptic oral rinse  15 mL Mouth Rinse q12n4p  . atorvastatin  20 mg Oral QPM  . budesonide  0.25 mg Nebulization BID  . chlorhexidine  15 mL Mouth Rinse BID  . digoxin  0.125 mg Oral Daily  . diltiazem  90 mg Oral 4 times per day  . feeding supplement (VITAL AF 1.2 CAL)  1,000 mL Per Tube Q24H  . insulin aspart  0-9 Units Subcutaneous TID WC  . methimazole  5 mg Oral Daily  . metoprolol tartrate  100 mg Oral TID  . piperacillin-tazobactam (ZOSYN)  IV  3.375 g Intravenous Q8H  . valproate sodium  500 mg Intravenous 3 times per day   PRN:  acetaminophen, acetaminophen,  food thickener, levalbuterol, metoprolol  Diet:  Dysphagia 1 honey thick liquids, meds crushed in puree Activity:  Up with assistance DVT Prophylaxis:  SCDs  CLINICALLY SIGNIFICANT STUDIES Basic Metabolic Panel:   Recent Labs Lab 03/28/14 0334 03/29/14 0314  NA 140 141  K 3.9 3.2*  CL 92* 91*  CO2 36* 34*  GLUCOSE 109* 107*  BUN 15 15  CREATININE 0.40* 0.46*  CALCIUM 9.3 9.0  MG  --  1.9   Liver Function Tests:   Recent Labs Lab 03/29/14 0314  AST 27  ALT 17  ALKPHOS 71  BILITOT 0.6  PROT 6.7  ALBUMIN 2.4*   CBC:   Recent Labs Lab 03/26/14 0320 03/29/14 0314  WBC 16.6* 12.2*  HGB 11.9* 11.1*  HCT 39.5 38.2  MCV 74.5* 77.0*  PLT 242 208   Coagulation:  No results found for this basename: LABPROT, INR,  in the last 168 hours Cardiac Enzymes: No results found for this basename: CKTOTAL, CKMB, CKMBINDEX, TROPONINI,  in the last 168 hours Urinalysis:   Recent Labs Lab 03/24/14 1350  COLORURINE AMBER*  LABSPEC 1.023  PHURINE 7.0  GLUCOSEU NEGATIVE  HGBUR SMALL*  BILIRUBINUR SMALL*  KETONESUR 40*  PROTEINUR 100*  UROBILINOGEN 4.0*  NITRITE POSITIVE*  LEUKOCYTESUR  LARGE*   Lipid Panel    Component Value Date/Time   CHOL 159 03/19/2014 0550   TRIG 134 03/19/2014 0550   HDL 58 03/19/2014 0550   CHOLHDL 2.7 03/19/2014 0550   VLDL 27 03/19/2014 0550   LDLCALC 74 03/19/2014 0550   HgbA1C  Lab Results  Component Value Date   HGBA1C 6.1* 03/19/2014    Urine Drug Screen:     Component Value Date/Time   LABOPIA POSITIVE* 12/07/2013 2131   COCAINSCRNUR NONE DETECTED 12/07/2013 2131   LABBENZ NONE DETECTED 12/07/2013 2131   AMPHETMU NONE DETECTED 12/07/2013 2131   THCU NONE DETECTED 12/07/2013 2131   LABBARB NONE DETECTED 12/07/2013 2131    Alcohol Level: No results found for this basename: ETH,  in the last 168 hours   CT of the brain   03/22/2014 Similar appearance of right temporal parietal 6.5 x 3.4 cm intraparenchymal hemorrhage with  intraventricular extension, similar right left midline shift. No rebleed. No hydrocephalus.  03/20/2014    Stable parenchymal hematoma in the right temporo-occipital region, as described above.  Mild surrounding vasogenic edema, extension into the left lateral ventricle, and 4 mm leftward midline shift, grossly unchanged.  Stable cortical effacement/loss of sulcation involving the right cerebral hemisphere, indicating cerebral edema.    03/19/2014    1. No significant interval change in intra-axial hematoma involving the right temporal-occipital region with intraventricular extension into the right lateral ventricle. The intraparenchymal hematoma measures 6.4 x 3.7 cm on today's exam, previously 6.4 x 3.7 cm on 03/18/2014. 2. Similar appearance of diffuse edema involving the right cerebral hemisphere. Similar leftward midline shift of 5 mm. 3. Similar enlargement of the temporal horns of the lateral ventricles bilaterally. No hydrocephalus. Basilar cisterns remain patent. 4. No new intracranial hemorrhage or infarct identified.    03/18/2014   1. 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 Intra-axial hematoma with estimated volume of 53 mm, plus secondary extension of hemorrhage into the right lateral ventricle. 2. Generalized right hemisphere edema. Leftward midline shift of 5 mm. 3. Mild enlargement of the temporal horns.   2D Echocardiogram  Left ventricle: The cavity size was normal. Wall thickness was normal. Systolic function was normal. The estimated ejection fraction was in the range of 55% to 60%. Wall motion was normal; there were no regional wall motion abnormalities.  Carotid Doppler  03/18/2014 1-39% internal carotid artery stenosis bilaterally. Vertebral arteries are patent with antegrade flow.  CXR   03/24/2014 Basilar infiltrates versus atelectasis. Further evaluation with PA  and lateral chest radiograph recommended all.  Feeding tube appreciated tip projecting in the fundal region of the stomach.  03/18/2014   No edema or consolidation.     EEG 03/22/2014 This EEG is most consistent with a generalized nonspecific cerebral dysfunction. This EEG represents a marked improvement compared to the EEG of yesterday. There was no seizure recorded on this study.  03/21/2014 This recorded 2 likely epileptic seizures addition to a moderate to severe generalized nonspecific cerebral dysfunction. There was no seizure or seizure predisposition recorded on this study.   EKG  A-fib.   Therapy Recommendations CIR  Physical Exam  General: in bed  Resp: non-laboured breathing  CV: Irregular  Abd: NT, ND  Ext: no edema  Mental Status:  Patient is drowsy and can easily be aroused but not following commands well. Will blink eyes and occasionally move right side to command   Cranial Nerves:  II: L hemianopia.  Pupils are equal, round, and reactive to light. III,IV, VI: right gaze deviation VII: Facial movement is decreased on left  VIII: hearing is intact to voice  X: Uvula elevates symmetrically  XII: tongue is midline without atrophy or fasciculations.  Motor:  Tone is normal. Bulk is normal. Moves right side against gravity and light resistance, not noted to move left side against gravity but exam limited due to mental status Sensory:  Decreased withdrawal to noxious stimuli on the left Deep Tendon Reflexes:  2+ and symmetric in the biceps and patellae.  Cerebellar:  UTO Gait:  Not assessed    ASSESSMENT Ms. Colleen [name] is a 63 y.o. female with hemorrhagic conversion of an ischemic infarct that occurred 03/16/2014. Imaging confirms a right temporo-occipital region with intraventricular extension and cerebral edema with leftward midline shift. She had been on warfarin (now on hold), INR on arrival is 2.61. It was reversed with anticoagulation reversal protocol down to 1.04. Previous infarct felt to be embolic  secondary to atrial fibrillation. Had been on coumadin in the past, now on hold d/t hemorrhage. Patient with resultant lethargy that seemed out of proportion to size of hemorrhage - EEG revealed non-convulsive status improved on Depacon, level 70.5 acceptable. Patient with resulting dysarthria, dysphagia and left hemiparesis. No change in neuro status.  Atrial fibrillation - recurred in setting of TIA, placed on coumadin with plans for lifelong treatment, currently held 2/2 d/t ICH. On cardizem and lopressort Hypertension Hyperlipidemia, LDL 74, on lipitor 20 mg daily PTA, now on lipitor 20 md daily, goal LDL < 70 for diabetics Diabetes Type 2, HgbA1c 6.1, goal < 7.0 Decreased UOP, started on IVF, improved Hyponatremia, 135 Hx stroke - 2009 stroke and TIA, 11/2013 TIA w/ [facility]ib [facility]  Sepsis with enterobacter UTI - txd w/ Zosyn and vancomycin.  Neurosurgery consult Ronnald Ramp) seen and signed off.  Hospital day # 11  TREATMENT/PLAN  Repeat CT in 1 week from now - if hemorrhage is resolving at that time, can consider anticoagulation. For now, not an anticoagulation candidate secondary to hemorrhage  Continue VPA 500mg  TID. May change to syrup once POs are tolerated. Cannot do pill as it cannot be crushed.  Disposition: IP Rehab, they are following.  Nothing further to add from the stroke standpoint  Ongoing risk factor control by Primary Care Physician  Stroke Service will sign off. Please call should any needs arise.  Follow up with Dr. Leonie Man, [facility], in 2 months.   Burnetta Sabin, MSN, RN, ANVP-BC, ANP-BC, [facility] Pager: [phone] 03/29/2014 9:50 AM  I have personally obtained a history, examined the patient, evaluated imaging results, and formulated the assessment and plan of care. I agree with the above. Antony Contras, MD  To contact Stroke Continuity provider, please refer to [email or web address] After hours, contact  General Neurology

## 2014-03-29 NOTE — Progress Notes (Signed)
NUTRITION FOLLOW UP  DOCUMENTATION CODES Per approved criteria  -Severe malnutrition in the context of chronic illness   INTERVENTION: Ensure Pudding po TID, each supplement provides 170 kcal and 4 grams of protein RD to follow for nutrition care plan  NUTRITION DIAGNOSIS: Inadequate oral intake now related to CVA as evidenced by meal tray observation, ongoing  Goal: Pt to meet >/= 90% of their estimated nutrition needs, unmet  Monitor:  PO & supplemental intake, TF re-initiation, weight, labs, I/O's  ASSESSMENT: PMHx significant for recent CVA, GERD, IBS, HTN, diverticulosis. Admitted with worsening L sided weakness. Work-up reveals hemorrhagic conversion of an ischemic infarct.  Patient transferred from 66[facility]-Medical ICU to 2C-Stepdown 3/27.  Patient sleeping with mittens on upon RD visit.  Noted pulled out NGT 3/30.  Breakfast meal on tray table; < 25% consumed per observation.  No other % PO intake available per flowsheet records.  S/p repeat MBSS 3/30 PM -- SLP recommending Dys 1-honey thick liquid diet.  Neurology notes reviewed.  Patient may need PEG tube.  RD to add oral nutrition supplements at this time.  Height: Ht Readings from Last 1 Encounters:  03/18/14 5\' 5"  (1.651 m)    Weight: Wt Readings from Last 1 Encounters:  03/29/14 149 lb 0.5 oz (67.6 kg)    BMI:  Body mass index is 24.8 kg/(m^2). WNL  Estimated Nutritional Needs: Kcal: 1600-1900 Protein: 90-105 gm Fluid: 1.6-1.9 L  Skin: Intact  Diet Order: Dyshagia 1, honey thick liquids   Intake/Output Summary (Last 24 hours) at 03/29/14 1130 Last data filed at 03/28/14 2000  Gross per 24 hour  Intake    100 ml  Output      0 ml  Net    100 ml   Labs:   Recent Labs Lab 03/27/14 0933 03/28/14 0334 03/29/14 0314  NA 135* 140 141  K 3.2* 3.9 3.2*  CL 89* 92* 91*  CO2 36* 36* 34*  BUN 19 15 15   CREATININE 0.44* 0.40* 0.46*  CALCIUM 9.0 9.3 9.0  MG  --   --  1.9  GLUCOSE 150* 109* 107*     CBG (last 3)   Recent Labs  03/28/14 2332 03/29/14 0401 03/29/14 0825  GLUCAP 97 100* 97   Lab Results  Component Value Date   HGBA1C 6.1* 03/19/2014    Scheduled Meds: . ALPRAZolam  0.25 mg Oral QHS  . antiseptic oral rinse  15 mL Mouth Rinse q12n4p  . atorvastatin  20 mg Oral QPM  . budesonide  0.25 mg Nebulization BID  . chlorhexidine  15 mL Mouth Rinse BID  . digoxin  0.125 mg Oral Daily  . diltiazem  90 mg Oral 4 times per day  . feeding supplement (VITAL AF 1.2 CAL)  1,000 mL Per Tube Q24H  . insulin aspart  0-9 Units Subcutaneous TID WC  . methimazole  5 mg Oral Daily  . metoprolol tartrate  100 mg Oral TID  . piperacillin-tazobactam (ZOSYN)  IV  3.375 g Intravenous Q8H  . valproate sodium  500 mg Intravenous 3 times per day    Continuous Infusions:    Arthur Holms, RD, LDN Pager #: [phone] After-Hours Pager #: [phone]

## 2014-03-29 NOTE — Progress Notes (Signed)
Patient transferred from [facility] to rehab. Patient is alertX1 with confusion unable to orient to rehab. Report given to oncoming nurse. Colleen [name]

## 2014-03-30 ENCOUNTER — Inpatient Hospital Stay (HOSPITAL_COMMUNITY): Payer: Medicaid Other

## 2014-03-30 ENCOUNTER — Inpatient Hospital Stay (HOSPITAL_COMMUNITY): Payer: Medicaid Other | Admitting: Occupational Therapy

## 2014-03-30 DIAGNOSIS — I6340 Cerebral infarction due to embolism of unspecified cerebral artery: Secondary | ICD-10-CM

## 2014-03-30 DIAGNOSIS — I472 Ventricular tachycardia: Secondary | ICD-10-CM

## 2014-03-30 DIAGNOSIS — M79609 Pain in unspecified limb: Secondary | ICD-10-CM

## 2014-03-30 DIAGNOSIS — I4729 Other ventricular tachycardia: Secondary | ICD-10-CM

## 2014-03-30 DIAGNOSIS — J449 Chronic obstructive pulmonary disease, unspecified: Secondary | ICD-10-CM

## 2014-03-30 DIAGNOSIS — R569 Unspecified convulsions: Secondary | ICD-10-CM

## 2014-03-30 DIAGNOSIS — E119 Type 2 diabetes mellitus without complications: Secondary | ICD-10-CM

## 2014-03-30 DIAGNOSIS — I69991 Dysphagia following unspecified cerebrovascular disease: Secondary | ICD-10-CM

## 2014-03-30 DIAGNOSIS — M7989 Other specified soft tissue disorders: Secondary | ICD-10-CM

## 2014-03-30 LAB — COMPREHENSIVE METABOLIC PANEL
ALK PHOS: 77 U/L (ref 39–117)
ALT: 17 U/L (ref 0–35)
AST: 29 U/L (ref 0–37)
Albumin: 2.7 g/dL — ABNORMAL LOW (ref 3.5–5.2)
BILIRUBIN TOTAL: 0.6 mg/dL (ref 0.3–1.2)
BUN: 11 mg/dL (ref 6–23)
CHLORIDE: 93 meq/L — AB (ref 96–112)
CO2: 32 meq/L (ref 19–32)
Calcium: 9.2 mg/dL (ref 8.4–10.5)
Creatinine, Ser: 0.4 mg/dL — ABNORMAL LOW (ref 0.50–1.10)
GLUCOSE: 126 mg/dL — AB (ref 70–99)
Potassium: 3.8 mEq/L (ref 3.7–5.3)
SODIUM: 139 meq/L (ref 137–147)
Total Protein: 7.1 g/dL (ref 6.0–8.3)

## 2014-03-30 LAB — GLUCOSE, CAPILLARY
GLUCOSE-CAPILLARY: 120 mg/dL — AB (ref 70–99)
GLUCOSE-CAPILLARY: 132 mg/dL — AB (ref 70–99)
Glucose-Capillary: 127 mg/dL — ABNORMAL HIGH (ref 70–99)
Glucose-Capillary: 137 mg/dL — ABNORMAL HIGH (ref 70–99)
Glucose-Capillary: 114 mg/dL — ABNORMAL HIGH (ref 70–99)

## 2014-03-30 LAB — CULTURE, BLOOD (ROUTINE X 2)
CULTURE: NO GROWTH
Culture: NO GROWTH

## 2014-03-30 LAB — CBC WITH DIFFERENTIAL/PLATELET
BASOS ABS: 0.1 10*3/uL (ref 0.0–0.1)
Basophils Relative: 0 % (ref 0–1)
EOS PCT: 2 % (ref 0–5)
Eosinophils Absolute: 0.4 10*3/uL (ref 0.0–0.7)
HEMATOCRIT: 41.4 % (ref 36.0–46.0)
Hemoglobin: 12 g/dL (ref 12.0–15.0)
LYMPHS ABS: 2.5 10*3/uL (ref 0.7–4.0)
Lymphocytes Relative: 15 % (ref 12–46)
MCH: 22.5 pg — ABNORMAL LOW (ref 26.0–34.0)
MCHC: 29 g/dL — ABNORMAL LOW (ref 30.0–36.0)
MCV: 77.5 fL — AB (ref 78.0–100.0)
Monocytes Absolute: 1.5 10*3/uL — ABNORMAL HIGH (ref 0.1–1.0)
Monocytes Relative: 9 % (ref 3–12)
NEUTROS ABS: 12.5 10*3/uL — AB (ref 1.7–7.7)
Neutrophils Relative %: 74 % (ref 43–77)
PLATELETS: 232 10*3/uL (ref 150–400)
RBC: 5.34 MIL/uL — AB (ref 3.87–5.11)
RDW: 20.6 % — AB (ref 11.5–15.5)
WBC: 16.9 10*3/uL — AB (ref 4.0–10.5)

## 2014-03-30 LAB — CLOSTRIDIUM DIFFICILE BY PCR: Toxigenic C. Difficile by PCR: NEGATIVE

## 2014-03-30 MED ORDER — FREE WATER
200.0000 mL | Freq: Three times a day (TID) | Status: DC
  Administered 2014-03-30 – 2014-04-04 (×12): 200 mL

## 2014-03-30 MED ORDER — CLONIDINE ORAL SUSPENSION 10 MCG/ML
0.1000 mg | Freq: Three times a day (TID) | ORAL | Status: DC | PRN
  Filled 2014-03-30: qty 10

## 2014-03-30 MED ORDER — VITAL AF 1.2 CAL PO LIQD
1000.0000 mL | ORAL | Status: DC
  Administered 2014-03-31 – 2014-04-04 (×4): 1000 mL
  Filled 2014-03-30 (×11): qty 1000

## 2014-03-30 MED ORDER — VITAL AF 1.2 CAL PO LIQD
1000.0000 mL | ORAL | Status: DC
  Filled 2014-03-30 (×2): qty 1000

## 2014-03-30 NOTE — Progress Notes (Signed)
65 Another RN successfully placed NG tube.  Radiology contacted for placement verification.

## 2014-03-30 NOTE — Evaluation (Signed)
Speech Language Pathology Assessment and Plan  Patient Details  Name: Colleen [name] MRN: [id number] Date of Birth: 03-26-1951  SLP Diagnosis: Dysarthria;Dysphagia;Cognitive Impairments;Speech and Language deficits  Rehab Potential: Fair ELOS: 21-24 days   Today's Date: 03/30/2014 Time: 1400-1447 Time Calculation (min): 47 min  Problem List:  Patient Active Problem List   Diagnosis Date Noted  . Intraparenchymal hemorrhage of brain 03/29/2014  . Acute hemorrhagic infarction of brain 03/26/2014  . Acute and chronic respiratory failure 03/26/2014  . HAP (hospital-acquired pneumonia) 03/24/2014  . Chronic respiratory failure with hypoxia 02/14/2014  . Chronic anticoagulation- Coumadin 01/06/2014  . H/O: CVA (cerebrovascular accident) 01/06/2014  . Normal coronary arteries- 2007 01/06/2014  . Diastolic dysfunction grade 2 by Echo 12/14 01/06/2014  . Atrial flutter 01/04/2014  . TIA (transient ischemic attack) 12/14 12/08/2013  . Diabetes mellitus 12/08/2013  . Atrial fibrillation with RVR 12/07/2013  . UNSPECIFIED DISORDER OF THYROID 10/09/2010  . HYPERLIPIDEMIA 08/08/2010  . TOBACCO ABUSE 08/08/2010  . AORTIC VALVE DISORDERS 08/08/2010  . CAROTID BRUIT 08/08/2010  . Atrial fibrillation 06/20/2010  . PALPITATIONS 06/20/2010  . DYSPNEA 06/20/2010  . CHEST PAIN 06/20/2010  . DYSPHAGIA UNSPECIFIED 06/20/2010  . HYPERTENSION, BENIGN 06/21/2008  . OBESITY 02/24/2008  . Personal history of colonic adenomas 02/05/2008  . ASTHMATIC BRONCHITIS, ACUTE 02/05/2008  . COPD 02/05/2008  . GASTROESOPHAGEAL REFLUX DISEASE 02/05/2008  . HIATAL HERNIA 02/05/2008   Past Medical History:  Past Medical History  Diagnosis Date  . Hypertension   . Hiatal hernia   . Adenomatous polyp of colon   . Acute asthmatic bronchitis   . GERD (gastroesophageal reflux disease)     EGD neg 03/07/2008, but prev required dilation  . COPD (chronic obstructive pulmonary disease)   . IBS (irritable bowel  syndrome)     chronic  . Urinary incontinence   . Esophagitis   . Chronic gastritis   . Duodenitis without hemorrhage 10/01/2001  . Diverticulosis   . Personal history of colonic adenomas 02/05/2008        . Anemia   . Hyperlipidemia   . Decreased peripheral vision of right eye   . Pneumonia 2011  . Hyperthyroidism   . Type II diabetes mellitus   . Stroke     a. Hx of stroke and ministrokes ~ 2009. b. TIA 11/2013, dx with AF at that time.  . Arthritis   . Depression   . On home oxygen therapy     "2L; suppose to use 24/7" (01/05/2014)  . PAF (paroxysmal atrial fibrillation)     a. Prior hx. b. Recurred 11/2013 in setting of possible TIA. Lifelong Coumadin planned. Spont converted to NSR. EF 60-65% by echo 11/2013, normal cors 2007, normal nuc 2011.   . Atrial flutter     a. Dx 12/2013. s/p DCCV. On anticoagulation.   Past Surgical History:  Past Surgical History  Procedure Laterality Date  . Colonoscopy    . Upper gi endoscopy    . Cholecystectomy  1980's  . Vaginal hysterectomy  1992  . Breast biopsy Left 1987    "milk gland"  . Cardioversion N/A 01/06/2014    Procedure: CARDIOVERSION;  Surgeon: Lewayne Bunting, MD;  [facility] OR;  Service: Cardiovascular;  Laterality: N/A;    Assessment / Plan / Recommendation Clinical Impression Pt is a 63 y.o. right-handed female with h/o COPD, type 2 diabetes mellitus, hypertension and atrial fibrillation with Coumadin therapy. Pt with recent right occipital infarct on Wednesday 03/16/14, was seen at  [facility] where a CT showed petechial hemorrhage. She was d/c to home after stopping her Coumadin. She presented to [facility] 03/18/14 after developing severe headache with nausea vomiting and left-sided weakness. CT and imaging showed right temporal occipital region infarct with intraventricular extension into the right lateral ventricle with cerebral edema and left midline shift. She was not a TPA candidate. Noted INR on  admission of 2.61; was reversed. F/u cranial CT scan 03/27/14 shows slight decrease in right intraparenchymal hematoma. Plan was to have f/u CT scan in one week, and if hemorrhage continues to resolve, may consider anticoagulation. Echocardiogram with ejection fraction of 60% no wall motion abnormalities. Recent carotid Dopplers with no ICA stenosis. 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 F/u neurology services maintained on valproate 500 mg every 8 hours.HCAP and maintained on empiric Zosyn x7 days initiated 03/24/14. Panda tube was placed for for nutritional support however pt pulled out the tube 03/28/14. Speech therapy placed on dysphagia 1 honey thick liquid diet. PT evaluation 03/21/14 with recommendations for physical medicine rehabilitation consult. Pt was admitted for comprehensive rehabilitation program. SLP bedside swallow evaluation reveals a severe aspiration risk with minimal PO intake prior to quick fatigue resulting in inability to orally accept further PO trials. Discussed recommendation for downgrade to NPO with RN and PA. Pt's cognitive-linguistic function was severely impacted by decreased level of alertness and focused attention, requiring Max-Total A for one-step commands and response to basic yes/no questions. Pt with minimal vocalizations throughout session. Pt will benefit from skilled SLP services to maximize swallowing safety, functional communication, functional cognition, and continued differential diagnosis of cognitive-linguistic abilities.   Skilled Therapeutic Interventions          SLP bedside-swallow and cognitive-linguistic evaluations were administered with results and recommendations reviewed with patient.    SLP Assessment  Patient will need skilled Speech Lanaguage Pathology Services during CIR admission    Recommendations  Diet Recommendations: NPO;Alternative means - temporary (discussed with  RN and PA) Medication Administration: Via alternative means Oral Care Recommendations: Oral care Q4 per protocol Patient destination: Home Follow up Recommendations: Home Health SLP;24 hour supervision/assistance Equipment Recommended: None recommended by SLP    SLP Frequency 5 out of 7 days   SLP Treatment/Interventions Cognitive remediation/compensation;Cueing hierarchy;Dysphagia/aspiration precaution training;Environmental controls;Functional tasks;Internal/external aids;Multimodal communication approach;Patient/family education;Speech/Language facilitation;Therapeutic Activities    Pain Pain Assessment Pain Assessment: Faces Faces Pain Scale: No hurt Prior Functioning Cognitive/Linguistic Baseline: Information not available Type of Home: House  Lives With: Spouse;Daughter;Other (Comment) (49 yo grandaughter) Available Help at Discharge: Personal care attendant;Available 24 hours/day;Family ([employer] CNA 5 days/wk, 6 hrs/day)  Short Term Goals: Week 1: SLP Short Term Goal 1 (Week 1): Pt will utilize safe swallowing strategies to consume therapeutic PO trials with Max cues SLP Short Term Goal 2 (Week 1): Pt will focus attention to auditory/tactile stimuli 75% of trials with Max cues SLP Short Term Goal 3 (Week 1): Pt will answer yes/no questions with 50% accuracy with Max cues SLP Short Term Goal 4 (Week 1): Pt will follow one-step commands throughout functional task with Max cues  See FIM for current functional status Refer to Care Plan for Long Term Goals  Recommendations for other services: None  Discharge Criteria: Patient will be discharged from SLP if patient refuses treatment 3 consecutive times without medical reason, if treatment goals not met, if there is a change in medical status, if patient makes no progress towards goals or if  patient is discharged from hospital.  The above assessment, treatment plan, treatment alternatives and goals were discussed and mutually agreed  upon: No family available/patient unable   Germain Osgood, M.A. CCC-SLP [phone]  Germain Osgood 03/30/2014, 5:14 PM

## 2014-03-30 NOTE — Care Management Note (Signed)
[facility] Individual Statement of Services  Patient Name:  Colleen [name]  Date:  03/30/2014  Welcome to the [facility].  Our goal is to provide you with an individualized program based on your diagnosis and situation, designed to meet your specific needs.  With this comprehensive rehabilitation program, you will be expected to participate in at least 3 hours of rehabilitation therapies Monday-Friday, with modified therapy programming on the weekends.  Your rehabilitation program will include the following services:  Physical Therapy (PT), Occupational Therapy (OT), Speech Therapy (ST), 24 hour per day rehabilitation nursing, Therapeutic Recreaction (TR), Case Management (Social Worker), Rehabilitation Medicine, Nutrition Services and Pharmacy Services  Weekly team conferences will be held on Wednesday to discuss your progress.  Your Social Worker will talk with you frequently to get your input and to update you on team discussions.  Team conferences with you and your family in attendance may also be held.  Expected length of stay: 21 DAYS Overall anticipated outcome: min wheelchair level  Depending on your progress and recovery, your program may change. Your Social Worker will coordinate services and will keep you informed of any changes. Your Social Worker's name and contact numbers are listed  below.  The following services may also be recommended but are not provided by the [facility]:   [place] will be made to provide these services after discharge if needed.  Arrangements include referral to agencies that provide these services.  Your insurance has been verified to be: Medicaid Your primary doctor is:  Dr Heide Scales  Pertinent information will be shared with your doctor and your insurance company.  Social Worker:  Ovidio Kin, [place] or ([phone]  Information discussed with and copy given to patient by: Elease Hashimoto, 03/30/2014, 1:43 PM

## 2014-03-30 NOTE — Progress Notes (Addendum)
INITIAL NUTRITION ASSESSMENT  DOCUMENTATION CODES Per approved criteria  -Severe malnutrition in the context of chronic illness   INTERVENTION: 1.  Enteral nutrition; initiate Vital 1.2 @ 20 mL/hr continuous.  Advance by 10 mL q 4 hrs to 70 mL/hr goal to provide 1670 kcal, 105g protein, 1135 mL free water.  TF may be held for therapy x4 hrs daily.  2.  Free water flushes; initiate 200 mL free water flushes TID.  Diet advancement per SLP discretion. RD to continue to follow nutrition care plan.  NUTRITION DIAGNOSIS: Inadequate oral intake related to inability to eat as evidenced by NPO status.   Goal: Intake to meet >90% of estimated nutrition needs.  Monitor:  weight trends, lab trends, I/O's, enteral nutrition tolerance  Reason for Assessment: Consult for TF Initiation/Management  63 y.o. female  Admitting Dx: hemorrhagic conversion of an ischemic infarct  ASSESSMENT: PMHx significant for recent CVA, GERD, IBS, HTN, diverticulosis. Admitted with worsening L sided weakness. Work-up reveals hemorrhagic conversion of an ischemic infarct.  Pt initially required NGT feeds via NGT for inability to swallow.  NGT was pulled and pt was able to start Dysphagia 1 diet with honey-thick liquids.  She was downgraded to pudding thick liquids yesterday. Discussed with SLP who reports pt will be NPO at this time due to cognition and lethargy.  RD has been consulted for TFs.   Pt with 15% wt loss in <3 months which is clinically significant.  Pt unable to contribute to nutrition hx at this time.   Nutrition Focused Physical Exam:  Subcutaneous Fat:  Orbital Region: WNL Upper Arm Region: moderate depletion Thoracic and Lumbar Region: n/a  Muscle:  Temple Region: moderate depletion Clavicle Bone Region: moderate depletion Clavicle and Acromion Bone Region: WNL Scapular Bone Region: mild depletion Dorsal Hand: mild depletion Patellar Region: moderate depletion Anterior Thigh Region:  moderate depletion Posterior Calf Region: moderate depletion  Edema: none  Pt meets criteria for severe MALNUTRITION in the context of chronic illness as evidenced by moderate muscle mass loss and 15% wt loss in <3 months.   Height: Ht Readings from Last 1 Encounters:  03/18/14 5\' 5"  (1.651 m)    Weight: Wt Readings from Last 1 Encounters:  03/30/14 147 lb 11.3 oz (67 kg)    Ideal Body Weight: 125 lb  % Ideal Body Weight: 118%  Wt Readings from Last 10 Encounters:  03/30/14 147 lb 11.3 oz (67 kg)  03/29/14 149 lb 0.5 oz (67.6 kg)  02/19/14 194 lb 7.1 oz (88.2 kg)  01/25/14 179 lb (81.194 kg)  01/17/14 177 lb (80.287 kg)  01/06/14 175 lb 0.7 oz (79.4 kg)  01/06/14 175 lb 0.7 oz (79.4 kg)  01/04/14 179 lb (81.194 kg)  12/08/13 184 lb 15.5 oz (83.9 kg)  10/03/10 201 lb (91.173 kg)    Usual Body Weight: 175 lb  % Usual Body Weight: 85%  BMI:  Body mass index is 24.58 kg/(m^2). WNL  Estimated Nutritional Needs: Kcal: 9323-5573 Protein: 86-100g Fluid: >1.8 L/day  Skin: intact  Diet Order:  NPO  EDUCATION NEEDS: -No education needs identified at this time   Intake/Output Summary (Last 24 hours) at 03/30/14 1457 Last data filed at 03/30/14 1000  Gross per 24 hour  Intake    195 ml  Output      0 ml  Net    195 ml    Last BM: 4/1  Labs:   Recent Labs Lab 03/28/14 0334 03/29/14 0314 03/30/14 0602  NA  140 141 139  K 3.9 3.2* 3.8  CL 92* 91* 93*  CO2 36* 34* 32  BUN 15 15 11   CREATININE 0.40* 0.46* 0.40*  CALCIUM 9.3 9.0 9.2  MG  --  1.9  --   GLUCOSE 109* 107* 126*    CBG (last 3)   Recent Labs  03/30/14 0304 03/30/14 0732 03/30/14 1142  GLUCAP 127* 120* 137*   Lab Results  Component Value Date   HGBA1C 6.1* 03/19/2014    Scheduled Meds: . ALPRAZolam  0.25 mg Oral QHS  . antiseptic oral rinse  15 mL Mouth Rinse q12n4p  . atorvastatin  20 mg Oral QPM  . budesonide  0.25 mg Nebulization BID  . chlorhexidine  15 mL Mouth Rinse BID   . digoxin  0.125 mg Oral Daily  . diltiazem  90 mg Oral 4 times per day  . feeding supplement (ENSURE)  1 Container Oral TID BM  . insulin aspart  0-9 Units Subcutaneous TID WC  . methimazole  5 mg Oral Daily  . metoprolol tartrate  100 mg Oral TID  . piperacillin-tazobactam (ZOSYN)  IV  3.375 g Intravenous Q8H  . Valproic Acid  500 mg Oral TID    Continuous Infusions: . sodium chloride 75 mL/hr at 03/30/14 1239    Past Medical History  Diagnosis Date  . Hypertension   . Hiatal hernia   . Adenomatous polyp of colon   . Acute asthmatic bronchitis   . GERD (gastroesophageal reflux disease)     EGD neg 03/07/2008, but prev required dilation  . COPD (chronic obstructive pulmonary disease)   . IBS (irritable bowel syndrome)     chronic  . Urinary incontinence   . Esophagitis   . Chronic gastritis   . Duodenitis without hemorrhage 10/01/2001  . Diverticulosis   . Personal history of colonic adenomas 02/05/2008        . Anemia   . Hyperlipidemia   . Decreased peripheral vision of right eye   . Pneumonia 2011  . Hyperthyroidism   . Type II diabetes mellitus   . Stroke     a. Hx of stroke and ministrokes ~ 2009. b. TIA 11/2013, dx with AF at that time.  . Arthritis   . Depression   . On home oxygen therapy     "2L; suppose to use 24/7" (01/05/2014)  . PAF (paroxysmal atrial fibrillation)     a. Prior hx. b. Recurred 11/2013 in setting of possible TIA. Lifelong Coumadin planned. Spont converted to NSR. EF 60-65% by echo 11/2013, normal cors 2007, normal nuc 2011.   . Atrial flutter     a. Dx 12/2013. s/p DCCV. On anticoagulation.    Past Surgical History  Procedure Laterality Date  . Colonoscopy    . Upper gi endoscopy    . Cholecystectomy  1980's  . Vaginal hysterectomy  1992  . Breast biopsy Left 1987    "milk gland"  . Cardioversion N/A 01/06/2014    Procedure: CARDIOVERSION;  Surgeon: Lelon Perla, MD;  Location: [place];  Service: Cardiovascular;  Laterality: N/A;     Brynda Greathouse, MS RD LDN Clinical Inpatient Dietitian Pager: [phone] Weekend/After hours pager: [phone]

## 2014-03-30 NOTE — Evaluation (Signed)
Occupational Therapy Assessment and Plan And Treatment Sessions  Patient Details  Name: Colleen [name] MRN: [id number] Date of Birth: Aug 30, 1951  OT Diagnosis: abnormal posture, disturbance of vision, muscle weakness (generalized) and will continue to assess. Rehab Potential: Rehab Potential: Fair ELOS: 21 days   Today's Date: 03/30/2014 Time: 1100-1145 and 130-145 Time Calculation (min): 45 min and 15 min Missed 15 min in AM and 15 min in PM due to decreased arousal  Problem List:  Patient Active Problem List   Diagnosis Date Noted  . Intraparenchymal hemorrhage of brain 03/29/2014  . Acute hemorrhagic infarction of brain 03/26/2014  . Acute and chronic respiratory failure 03/26/2014  . HAP (hospital-acquired pneumonia) 03/24/2014  . Chronic respiratory failure with hypoxia 02/14/2014  . Chronic anticoagulation- Coumadin 01/06/2014  . H/O: CVA (cerebrovascular accident) 01/06/2014  . Normal coronary arteries- 2007 01/06/2014  . Diastolic dysfunction grade 2 by Echo 12/14 01/06/2014  . Atrial flutter 01/04/2014  . TIA (transient ischemic attack) 12/14 12/08/2013  . Diabetes mellitus 12/08/2013  . Atrial fibrillation with RVR 12/07/2013  . UNSPECIFIED DISORDER OF THYROID 10/09/2010  . HYPERLIPIDEMIA 08/08/2010  . TOBACCO ABUSE 08/08/2010  . AORTIC VALVE DISORDERS 08/08/2010  . CAROTID BRUIT 08/08/2010  . Atrial fibrillation 06/20/2010  . PALPITATIONS 06/20/2010  . DYSPNEA 06/20/2010  . CHEST PAIN 06/20/2010  . DYSPHAGIA UNSPECIFIED 06/20/2010  . HYPERTENSION, BENIGN 06/21/2008  . OBESITY 02/24/2008  . Personal history of colonic adenomas 02/05/2008  . ASTHMATIC BRONCHITIS, ACUTE 02/05/2008  . COPD 02/05/2008  . GASTROESOPHAGEAL REFLUX DISEASE 02/05/2008  . HIATAL HERNIA 02/05/2008    Past Medical History:  Past Medical History  Diagnosis Date  . Hypertension   . Hiatal hernia   . Adenomatous polyp of colon   . Acute asthmatic bronchitis   . GERD (gastroesophageal  reflux disease)     EGD neg 03/07/2008, but prev required dilation  . COPD (chronic obstructive pulmonary disease)   . IBS (irritable bowel syndrome)     chronic  . Urinary incontinence   . Esophagitis   . Chronic gastritis   . Duodenitis without hemorrhage 10/01/2001  . Diverticulosis   . Personal history of colonic adenomas 02/05/2008        . Anemia   . Hyperlipidemia   . Decreased peripheral vision of right eye   . Pneumonia 2011  . Hyperthyroidism   . Type II diabetes mellitus   . Stroke     a. Hx of stroke and ministrokes ~ 2009. b. TIA 11/2013, dx with AF at that time.  . Arthritis   . Depression   . On home oxygen therapy     "2L; suppose to use 24/7" (01/05/2014)  . PAF (paroxysmal atrial fibrillation)     a. Prior hx. b. Recurred 11/2013 in setting of possible TIA. Lifelong Coumadin planned. Spont converted to NSR. EF 60-65% by echo 11/2013, normal cors 2007, normal nuc 2011.   . Atrial flutter     a. Dx 12/2013. s/p DCCV. On anticoagulation.   Past Surgical History:  Past Surgical History  Procedure Laterality Date  . Colonoscopy    . Upper gi endoscopy    . Cholecystectomy  1980's  . Vaginal hysterectomy  1992  . Breast biopsy Left 1987    "milk gland"  . Cardioversion N/A 01/06/2014    Procedure: CARDIOVERSION;  Surgeon: Lelon Perla, MD;  Location: [facility] OR;  Service: Cardiovascular;  Laterality: N/A;    Assessment & Plan Clinical Impression: Colleen [name] is  a 63 y.o. right-handed female with history of TIA, COPD, hypertension and atrial fibrillation with Coumadin therapy. Patient with recent right occipital infarct on Wednesday 03/16/2014 was seen at [facility] following a CT showed petechial hemorrhage. She was discharged to home after stopping her Coumadin.  She presented to [facility] 03/18/2014 after developing severe headache with nausea vomiting and left-sided weakness. CT and imaging showed right temporal occipital region infarct with  intraventricular extension into the right lateral ventricle with cerebral edema and left midline shift. She was not a TPA candidate. Noted INR on admission of 2.61 it was reversed. Followup cranial CT scan shows similar appearance of right temporoparietal 6.5 x 3.4 cm intraparenchymal hemorrhage no rebleed without hydrocephalus. 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 Followup neurology services maintained on valproate 500 mg every 8 hours. A Panda tube was placed for nutritional support which patient eventually pulled out (per patient's daughter).  RN noted labored respirations during the night 3/25 - increased bronchi, wheezing and crackles, unproductive cough, given lasix. This was found to be stable during rounds by Dr. Leonie Man. Again with respiratory distress during the night 3/26. Seen by Aram Beecham; transferred back to the ICU. Dr. Leonie Man discussed with Dr. Alva Garnet who feels pt with developing pneumonia. Per. Dr. Thereasa Solo on 03/29/14, HR better controlled and ready for CIR. Patient transferred to CIR on 03/29/2014 .    Patient currently requires total +2 with basic self-care skills secondary to muscle weakness, decreased midline orientation, decreased attention to left and left side neglect and decreased sitting balance, decreased standing balance, decreased postural control, hemiplegia and decreased balance strategies.  When patient is more alert, anticipate more deficits.  Prior to hospitalization, patient had CAPS worker 5 days/week for 6 hrs/day and patient did very little for herself.  Recent decline since discharge ~Feb, 2015.  Daughter reports, she was told that her mom had "sundowners" except per daughter-patient exhibits this behavior all of the time. Patient will benefit from skilled intervention to decrease level of assist with basic self-care skills prior to discharge home with care partner  Anticipate patient will require 24  hour supervision, minimal physical assistance, moderate physical assestance and cognitive assistance and follow up home health.  OT - End of Session Activity Tolerance: Decreased this session Endurance Deficit: Yes Endurance Deficit Description: lethargic, decreased arousal OT Assessment Rehab Potential: Fair Barriers to Discharge: Decreased caregiver support Barriers to Discharge Comments: has CAPS worker 5 days/wk, 6 hrs/day and family to assist weekends and when CAPS worker not there.  Unsure if daughter, Wilburn Cornelia, is physically able to provide level of assist patient may need at discharge. OT Patient demonstrates impairments in the following area(s): Balance;Behavior;Cognition;Endurance;Motor;Perception;Safety;Skin Integrity OT Basic ADL's Functional Problem(s): Eating;Grooming;Bathing;Dressing;Toileting OT Transfers Functional Problem(s): Toilet OT Plan OT Intensity: Minimum of 1-2 x/day, 45 to 90 minutes OT Frequency: Total of 15 hours over 7 days Frequency due to: decreased arousal and lethargic OT Duration/Estimated Length of Stay: 21 days OT Treatment/Interventions: Balance/vestibular training;Cognitive remediation/compensation;DME/adaptive equipment instruction;Discharge planning;Functional mobility training;Neuromuscular re-education;Patient/family education;Psychosocial support;Self Care/advanced ADL retraining;Therapeutic Activities;UE/LE Strength taining/ROM OT Self Feeding Anticipated Outcome(s): Min Assist OT Basic Self-Care Anticipated Outcome(s): Mod Assist OT Toileting Anticipated Outcome(s): Mod Assist OT Bathroom Transfers Anticipated Outcome(s): Mod Assist OT Recommendation Patient destination: Home Follow Up Recommendations: Home health OT Equipment Recommended: To be determined Equipment Details: Patient has shower chair without a back and Rollator   Skilled Therapeutic Intervention 1)  OT eval and attempted self care.  Patient resting in bed upon arrival with  husband and daughter in the room.  Patient with eyes closed 95% of the session, did not respond to any commands, when husband was brushing her hair, she initiated slowly raising her right hand in the direction of her right ear then stopped at neck.  When asked if head hurt where he was brushing, she responded with a very slight head nod-yes.  No verbal responses except when patient responded with "what?" 3 different times in response to husband repeatedly calling her name.  Daughter stated, "We have to aggravate her to get her to respond---she is stubborn and ornery".  Total Assist +2 for bed mobility and HOB><sit EOB and total-max Assist with sitting balance.  Session cut short due to decreased arousal and lethargy.  2)  Patient again not responding to any multi-modal cues, eyes closed, and family not present.  Performed PROM of BUEs which was [facility].  Session cut short due to decreased arousal and lethargy.    OT Evaluation Precautions/Restrictions  Precautions Precautions: Fall Precaution Comments: inattention to left, decreased arousal on eval, not able to follow commands, family reports dementia Restrictions Weight Bearing Restrictions: No Other Position/Activity Restrictions: hypertension day of eval; PA ok'd proceeding with eval Pain Only response to pain was slow movement of right hand and slight nod of head Home Living/Prior Functioning Home Living Living Arrangements: Spouse/significant other;Children Available Help at Discharge: Personal care attendant;Available 24 hours/day;Family (CAPS CNA 5 days/wk, 6 hrs/day) Type of Home: House Home Access: Stairs to enter [employer] of Steps: 4 Entrance Stairs-Rails: None Home Layout: One level Additional Comments: PTA pt's CNA and/or family assisted patient with Rollator to a walk in shower with sliding doors and onto a shower chair without back.  Lives With: Spouse;Daughter;Other (Comment) (26 yo grandaughter) Prior  Function Level of Independence: Needs assistance with gait;Needs assistance with ADLs;Needs assistance with tranfers Driving: No Comments: O2 at 2 liters at home pta, CAPS worker and family assisted patient with all BADL tasks. Daughter reports she was told that her mom had "sundowners" except she reports the behavior is all of the time. ADL Total assist +2  Vision/Perception  Vision - Assessment Additional Comments: Unable to fully assess due to decreased arousal.  Appeared to have right gaze preference  Cognition Overall Cognitive Status: Impaired/Different from baseline Arousal/Alertness: Lethargic (quickly fatigued, with minimal participation) Orientation Level: Disoriented X4 Attention: Focused Focused Attention: Impaired Focused Attention Impairment: Verbal basic;Functional basic Memory: Impaired Awareness: Impaired Problem Solving: Impaired Executive Function:  (all impaired due to lower level deficits) Safety/Judgment: Impaired Sensation Sensation Light Touch: Not tested Stereognosis: Not tested Hot/Cold: Not tested Proprioception: Not tested Additional Comments: BUE's NT due to decreased arousal Coordination Gross Motor Movements are Fluid and Coordinated: Not tested (due to decreased arousal) Fine Motor Movements are Fluid and Coordinated: Not tested (due to decreased arousal) Motor  Motor Motor: Hemiplegia;Abnormal tone (slight L hip ext tone detected) Motor - Skilled Clinical Observations: unable to assess due to decreased arousal Mobility  Bed Mobility Bed Mobility: Rolling Right;Right Sidelying to Sit Rolling Right: 2: Max assist Rolling Right Details: Manual facilitation for placement;Manual facilitation for weight shifting Right Sidelying to Sit: 1: +2 Total assist  Trunk/Postural Assessment  Cervical Assessment Cervical Assessment: Exceptions to [facility] Cervical Strength Overall Cervical Strength Comments: head rests in R lateral flexion and R  rotation Thoracic Assessment Thoracic Assessment: Exceptions to [facility] Thoracic Strength Overall Thoracic Strength Comments: kyphotic; unable to extend to assist with sitting upright Lumbar  Assessment Lumbar Assessment: Exceptions to [facility] (increased R wt bearing) Lumbar Strength Overall Lumbar Strength Comments: posterior pelvic tilt; unable to extend into neutral Postural Control Postural Control: Deficits on evaluation Head Control: none  Trunk Control: none Righting Reactions: none Protective Responses: none detected Postural Limitations: unable to reach midline or maintain sitting posture- falls to R  Balance Balance Balance Assessed: Yes Static Sitting Balance Static Sitting - Level of Assistance: 1: +1 Total assist Static Standing Balance Static Standing - Level of Assistance: 1: +2 Total assist Extremity/Trunk Assessment RUE Assessment RUE Assessment:  (PROM WFL assessed while patient sleeping) LUE Assessment LUE Assessment:  (PROM WFL assessed while patient sleeping)  FIM:  FIM - Grooming Grooming: 1: Patient completes 0 of 4 or 1 of 5 steps, or requires 2 helpers FIM - Bathing Bathing: 1: Total-Patient completes 0-2 of 10 parts or less than 25% FIM - Upper Body Dressing/Undressing Upper body dressing/undressing: 0: Activity did not occur (did not attempt secondary to lethargy and decreased arousal) FIM - Lower Body Dressing/Undressing Lower body dressing/undressing: 0: Activity did not occur FIM - Bed/Chair Transfer Bed/Chair Transfer: 1: Two helpers;1: Supine > Sit: Total A (helper does all/Pt. < 25%);1: Sit > Supine: Total A (helper does all/Pt. < 25%) FIM - Tub/Shower Transfers Tub/shower Transfers: 0-Activity did not occur or was simulated (Unsafe seconday to lethargy & decreased arousal)   Refer to Care Plan for Long Term Goals  Recommendations for other services: None at this time.  Anticipate Neuropsych may be beneficial as patient able to participate in  assessment  Discharge Criteria: Patient will be discharged from OT if patient refuses treatment 3 consecutive times without medical reason, if treatment goals not met, if there is a change in medical status, if patient makes no progress towards goals or if patient is discharged from hospital.  The above assessment, treatment plan, treatment alternatives and goals were discussed and mutually agreed upon: by family  Jasmine Mcbeth, [place] 03/30/2014, 4:54 PM

## 2014-03-30 NOTE — Progress Notes (Signed)
*  PRELIMINARY RESULTS* Vascular Ultrasound Lower extremity venous duplex has been completed.  Preliminary findings: no obvious evidence of DVT. Technically limited study due to patient cooperation.   Landry Mellow, RDMS, RVT  03/30/2014, 10:18 AM

## 2014-03-30 NOTE — Progress Notes (Signed)
Patient found to have NGT out. A11 sent for replacement. Will re-attempt insertion when received.

## 2014-03-30 NOTE — Progress Notes (Signed)
Patient information reviewed and entered into eRehab system by Doloris Servantes, RN, CRRN, PPS Coordinator.  Information including medical coding and functional independence measure will be reviewed and updated through discharge.    

## 2014-03-30 NOTE — Progress Notes (Signed)
Social Work Assessment and Plan Social Work Assessment and Plan  Patient Details  Name: Colleen [name] MRN: [id number] Date of Birth: 04-Apr-1951  Today's Date: 03/30/2014  Problem List:  Patient Active Problem List   Diagnosis Date Noted  . Intraparenchymal hemorrhage of brain 03/29/2014  . Acute hemorrhagic infarction of brain 03/26/2014  . Acute and chronic respiratory failure 03/26/2014  . HAP (hospital-acquired pneumonia) 03/24/2014  . Chronic respiratory failure with hypoxia 02/14/2014  . Chronic anticoagulation- Coumadin 01/06/2014  . H/O: CVA (cerebrovascular accident) 01/06/2014  . Normal coronary arteries- 2007 01/06/2014  . Diastolic dysfunction grade 2 by Echo 12/14 01/06/2014  . Atrial flutter 01/04/2014  . TIA (transient ischemic attack) 12/14 12/08/2013  . Diabetes mellitus 12/08/2013  . Atrial fibrillation with RVR 12/07/2013  . UNSPECIFIED DISORDER OF THYROID 10/09/2010  . HYPERLIPIDEMIA 08/08/2010  . TOBACCO ABUSE 08/08/2010  . AORTIC VALVE DISORDERS 08/08/2010  . CAROTID BRUIT 08/08/2010  . Atrial fibrillation 06/20/2010  . PALPITATIONS 06/20/2010  . DYSPNEA 06/20/2010  . CHEST PAIN 06/20/2010  . DYSPHAGIA UNSPECIFIED 06/20/2010  . HYPERTENSION, BENIGN 06/21/2008  . OBESITY 02/24/2008  . Personal history of colonic adenomas 02/05/2008  . ASTHMATIC BRONCHITIS, ACUTE 02/05/2008  . COPD 02/05/2008  . GASTROESOPHAGEAL REFLUX DISEASE 02/05/2008  . HIATAL HERNIA 02/05/2008   Past Medical History:  Past Medical History  Diagnosis Date  . Hypertension   . Hiatal hernia   . Adenomatous polyp of colon   . Acute asthmatic bronchitis   . GERD (gastroesophageal reflux disease)     EGD neg 03/07/2008, but prev required dilation  . COPD (chronic obstructive pulmonary disease)   . IBS (irritable bowel syndrome)     chronic  . Urinary incontinence   . Esophagitis   . Chronic gastritis   . Duodenitis without hemorrhage 10/01/2001  . Diverticulosis   . Personal  history of colonic adenomas 02/05/2008        . Anemia   . Hyperlipidemia   . Decreased peripheral vision of right eye   . Pneumonia 2011  . Hyperthyroidism   . Type II diabetes mellitus   . Stroke     a. Hx of stroke and ministrokes ~ 2009. b. TIA 11/2013, dx with AF at that time.  . Arthritis   . Depression   . On home oxygen therapy     "2L; suppose to use 24/7" (01/05/2014)  . PAF (paroxysmal atrial fibrillation)     a. Prior hx. b. Recurred 11/2013 in setting of possible TIA. Lifelong Coumadin planned. Spont converted to NSR. EF 60-65% by echo 11/2013, normal cors 2007, normal nuc 2011.   . Atrial flutter     a. Dx 12/2013. s/p DCCV. On anticoagulation.   Past Surgical History:  Past Surgical History  Procedure Laterality Date  . Colonoscopy    . Upper gi endoscopy    . Cholecystectomy  1980's  . Vaginal hysterectomy  1992  . Breast biopsy Left 1987    "milk gland"  . Cardioversion N/A 01/06/2014    Procedure: CARDIOVERSION;  Surgeon: Lelon Perla, MD;  [facility];  Service: Cardiovascular;  Laterality: N/A;   Social History:  reports that she has been smoking Cigarettes.  She has a 51 pack-year smoking history. She has never used smokeless tobacco. She reports that she does not drink alcohol or use illicit drugs.  Family / Support Systems Marital Status: Married Patient Roles: Spouse;Parent Spouse/Significant Other: Marcello Moores [phone] Children: Wilburn Cornelia Taylor-daughter  [phone]  [phone] Other Supports:  Larene Beach Sakai-daughter  [phone] Anticipated Caregiver: CAPS worker and Wilburn Cornelia Ability/Limitations of Caregiver: Engineer, agricultural M-F 8;00-2;30 Pm, daughter has back issues  Caregiver Availability: 24/7 Family Dynamics: Close knit family who are there for one another and very close.  Daughter has always done for Mom and hopes to do so again.  She is limited by her back and can not lift.  Her dad works and is there in the evenings.  Social  History Preferred language: English Religion: Christian Cultural Background: No issues Education: High School Read: Yes Write: Yes Employment Status: Disabled Freight forwarder Issues: No issues Guardian/Conservator: None-according to MD pt is not capable of making her own decisions while here.  Will look toward her husband to make any decisions while here.   Abuse/Neglect Physical Abuse: Denies Verbal Abuse: Denies Sexual Abuse: Denies Exploitation of patient/patient's resources: Denies Self-Neglect: Denies  Emotional Status Pt's affect, behavior adn adjustment status: Pt is unable to participate in interview due to language issues form her stroke.  Her daughter reports she has been requiring care since hospitalization in Feb 2015.  She always tries her best to do what she can for herself.  She takes each day at a time, to get through. Recent Psychosocial Issues: Other medical issues-multiple falls reason somone has to walk beside her. Pyschiatric History: No history-unable to assess her due to deficits from her CVA.  WIll reassess as progresses. Substance Abuse History: Tobacco-smoked at home, daughter feels she needs to quit, esepcially if using her oxygen  Patient / Family Perceptions, Expectations & Goals Pt/Family understanding of illness & functional limitations: Daughter and pt's husband have a basic understanding of her CVA and deficits.  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  Would like to see once pneumonia is treated how she is, many medical issues at this time. Premorbid pt/family roles/activities: Wife, Mother, Cory Roughen. Retiree,. [employer], etc Anticipated changes in roles/activities/participation: resume Pt/family expectations/goals: Daughter states; " I hope she makes progress here, so she can come back home."  Husband states; ' I will help with what I can."  [employer]:  Other (Comment) (CAPS Services) Premorbid Home Care/DME Agencies: Other (Comment) (Had in past) Transportation available at discharge: Family Resource referrals recommended: Support group (specify) (CVA Support group)  Discharge Planning Living Arrangements: Spouse/significant other;Children Support Systems: Spouse/significant other;Children;Friends/neighbors;Church/faith community Type of Residence: Private residence Insurance Resources: Medicaid (specify county) ([place]) Museum/gallery curator Resources: Tax inspector Screen Referred: No Living Expenses: Lives with family Money Management: Spouse;Family Does the patient have any problems obtaining your medications?: No Home Management: Family Patient/Family Preliminary Plans: Return home wiht daughter and husband along with CAP worker.  Pt required assistance priro to admission, but will need more this time.  Unsure if daughter due to back issues can provide lifting assistance.  Will await pt's progress. Social Work Anticipated Follow Up Needs: HH/OP;SNF;Support Group  Clinical Impression Very pleasant family who are willing to assist pt, but she may be more assist than they can provide due to this CVA.  They were having to ambulate beside her prior to admission due to history of falls. Unable to assess pt due to deficits from her stroke.  Will see how pt progresses and work with family on a safe discharge plan.  Elease Hashimoto 03/30/2014, 2:20 PM

## 2014-03-30 NOTE — Progress Notes (Addendum)
28 RN attempted to place 10 french NG tube for feedings as ordered by MD. RN attempted multiple times for placement to both nares unsuccessfully with nasal obstruction.  MD notified and RN recommends radiology for tube placement.

## 2014-03-30 NOTE — Progress Notes (Signed)
[place] PHYSICAL MEDICINE & REHABILITATION     PROGRESS NOTE    Subjective/Complaints: Slow to respond this morning. Eventually woke up more with therapy A 12 ROS limited by cognitive   Objective: Vital Signs: Blood pressure 196/95, pulse 75, temperature 97.6 F (36.4 C), temperature source Oral, resp. rate 25, weight 67 kg (147 lb 11.3 oz), SpO2 94.00%. Dg Swallowing Func-speech Pathology  03/29/2014   Katherene Ponto Deblois, CCC-SLP     03/29/2014  3:23 PM Objective Swallowing Evaluation: Modified Barium Swallowing Study   Patient Details  Name: Colleen [name] Date of Birth: 1951-12-14  Today's Date: 03/28/2014 Time: 12-1403 SLP Time Calculation (min): 20 min  Past Medical History:  Past Medical History  Diagnosis Date  . Hypertension   . Hiatal hernia   . Adenomatous polyp of colon   . Acute asthmatic bronchitis   . GERD (gastroesophageal reflux disease)     EGD neg 03/07/2008, but prev required dilation  . COPD (chronic obstructive pulmonary disease)   . IBS (irritable bowel syndrome)     chronic  . Urinary incontinence   . Esophagitis   . Chronic gastritis   . Duodenitis without hemorrhage 10/01/2001  . Diverticulosis   . Personal history of colonic adenomas 02/05/2008        . Anemia   . Hyperlipidemia   . Decreased peripheral vision of right eye   . Pneumonia 2011  . Hyperthyroidism   . Type II diabetes mellitus   . Stroke     a. Hx of stroke and ministrokes ~ 2009. b. TIA 11/2013, dx with  AF at that time.  . Arthritis   . Depression   . On home oxygen therapy     "2L; suppose to use 24/7" (01/05/2014)  . PAF (paroxysmal atrial fibrillation)     a. Prior hx. b. Recurred 11/2013 in setting of possible TIA.  Lifelong Coumadin planned. Spont converted to NSR. EF 60-65% by  echo 11/2013, normal cors 2007, normal nuc 2011.   . Atrial flutter     a. Dx 12/2013. s/p DCCV. On anticoagulation.   Past Surgical History:  Past Surgical History  Procedure Laterality Date  . Colonoscopy    . Upper  gi endoscopy    . Cholecystectomy  1980's  . Vaginal hysterectomy  1992  . Breast biopsy Left 1987    "milk gland"  . Cardioversion N/A 01/06/2014    Procedure: CARDIOVERSION;  Surgeon: Lelon Perla, MD;   Location: [facility];  Service: Cardiovascular;  Laterality: N/A;   HPI:  63 y.o. female with a history of recent stroke on Wednesday who  was discharged from [facility] after having coumadin stopped  for some pethechial hemorrhage associated with the hemorrhage.   PMH:  HTN, hiatal hernia, GERD, COPD, esophagitis, chronic  gastritis, pna, CVA 2009.  CXR 3/20 no edema or consolidation.    02/14/14 right lower lobe infiltrate and effusion.  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 Intra-axial  hematoma with estimated volume of 53 mm, plus secondary extension  of hemorrhage into the right lateral ventricle.  Repeat CT 3/21  No new intracranial hemorrhage or infarct identified.   No edema  or consolidation.      Assessment / Plan / Recommendation Clinical Impression  Dysphagia Diagnosis: Severe oral phase dysphagia;Severe  pharyngeal phase dysphagia Clinical impression: Pt demonstrates a severe oral and  oropharyngeal dysphagia due to  cognitive deficits and delayed  swallow response. Pt orally holds bolus, sometimes with premature  spillage to the pyriform sinuses; several seconds pass before a  swallow is initiated. With boluses any larger than teaspoon size,  pt is likely to aspirate before the swallow. Recommend Dys 1  (puree) diet and honey thick liquids given by teaspoon only. SLP  will f/u for tolerance. As pts swallow trigger becomes more  consistent and timely pt may upgrade to nectar.     Treatment Recommendation  Therapy as outlined in treatment plan below    Diet Recommendation Dysphagia 1 (Puree);Honey-thick liquid   Liquid Administration via: Spoon Medication Administration: Crushed with puree Supervision: Trained caregiver  to feed patient;Full  supervision/cueing for compensatory strategies Compensations: Slow rate;Small sips/bites Postural Changes and/or Swallow Maneuvers: Seated upright 90  degrees    Other  Recommendations Oral Care Recommendations: Oral care BID Other Recommendations: Order thickener from pharmacy   Follow Up Recommendations  Inpatient Rehab    Frequency and Duration min 2x/week  2 weeks   Pertinent Vitals/Pain NA    SLP Swallow Goals     General HPI: 63 y.o. female with a history of recent stroke on  Wednesday who was discharged from [facility] after having  coumadin stopped for some pethechial hemorrhage associated with  the hemorrhage.  PMH:  HTN, hiatal hernia, GERD, COPD,  esophagitis, chronic gastritis, pna, CVA 2009.  CXR 3/20 no edema  or consolidation.   02/14/14 right lower lobe infiltrate and  effusion.  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 Intra-axial hematoma with estimated volume of 53 mm,  plus secondary extension of hemorrhage into the right lateral  ventricle.  Repeat CT 3/21 No new intracranial hemorrhage or  infarct identified.   No edema or consolidation.  Type of Study: Modified Barium Swallowing Study Reason for Referral: Objectively evaluate swallowing function Previous Swallow Assessment: MBS 3/21 Diet Prior to this Study: NPO Temperature Spikes Noted: No Respiratory Status: Nasal cannula History of Recent Intubation: No Behavior/Cognition: Impulsive;Requires  cueing;Distractible;Decreased sustained attention;Confused Oral Cavity - Dentition: Missing dentition;Edentulous Oral Motor / Sensory Function: Impaired - see Bedside swallow  eval Self-Feeding Abilities: Total assist Patient Positioning: Postural control interferes with function Baseline Vocal Quality: Clear;Low vocal intensity Volitional Cough: Cognitively unable to elicit Volitional Swallow: Unable to elicit Anatomy: Within functional limits  Pharyngeal Secretions: Not observed secondary MBS    Reason for Referral Objectively evaluate swallowing function   Oral Phase Oral Preparation/Oral Phase Oral Phase: Impaired Oral - Honey Oral - Honey Teaspoon: Delayed oral transit;Holding of  bolus;Reduced posterior propulsion Oral - Nectar Oral - Nectar Teaspoon: Delayed oral transit;Holding of  bolus;Reduced posterior propulsion Oral - Nectar Cup: Delayed oral transit;Holding of bolus;Reduced  posterior propulsion Oral - Solids Oral - Puree: Delayed oral transit;Holding of bolus;Reduced  posterior propulsion   Pharyngeal Phase Pharyngeal Phase Pharyngeal Phase: Impaired Pharyngeal - Honey Pharyngeal - Honey Teaspoon: Premature spillage to pyriform  sinuses;Delayed swallow initiation Penetration/Aspiration details (honey teaspoon): Material does  not enter airway Pharyngeal - Nectar Pharyngeal - Nectar Teaspoon: Delayed swallow  initiation;Premature spillage to pyriform sinuses Pharyngeal - Nectar Cup: Not tested Pharyngeal - Thin Pharyngeal - Thin Teaspoon: Not tested Pharyngeal - Thin Cup: Not tested Pharyngeal - Solids Pharyngeal - Puree: Delayed swallow initiation  Cervical Esophageal Phase    GO             Herbie Baltimore, MA CCC-SLP [phone]  DeBlois, Katherene Ponto 03/28/2014, 2:25 PM     Recent Labs  03/29/14 0314 03/30/14 0602  WBC 12.2* 16.9*  HGB 11.1* 12.0  HCT 38.2 41.4  PLT 208 232    Recent Labs  03/29/14 0314 03/30/14 0602  NA 141 139  K 3.2* 3.8  CL 91* 93*  GLUCOSE 107* 126*  BUN 15 11  CREATININE 0.46* 0.40*  CALCIUM 9.0 9.2   CBG (last 3)   Recent Labs  03/29/14 1644 03/30/14 0304 03/30/14 0732  GLUCAP 131* 127* 120*    Wt Readings from Last 3 Encounters:  03/30/14 67 kg (147 lb 11.3 oz)  03/29/14 67.6 kg (149 lb 0.5 oz)  02/19/14 88.2 kg (194 lb 7.1 oz)    Physical Exam:  Constitutional:  Right lying in bed incontinent with mitten on right hand and left hand. Slow to arouse HENT: oral mucosa pink  and moist  Head: Normocephalic. atraumatic  Eyes:  Pupils reactive to light  Neck: Normal range of motion. Neck supple. No thyromegaly present.  Cardiovascular:  Cardiac rate controlled  Respiratory: scattered rhonchi, poor air movement as a whole due to decreased exp/ins effort GI: Soft. Bowel sounds are normal. She exhibits no distension.  Neurological:  Patient is but distracted. Right gaze preference. Inconsistently follow commands with limited attention. She did grimace to deep palpation on the left arm and leg. Left inattention. Did see some slight spontaneous movement of the left arm and leg. Moves right arm spontaneously. Left delt,bicep,tricep, hand, HF, KE, ADP/APF 0/5. No resting tone. Sensation 1/2 to LT, senses pain.  Skin: Skin is warm and dry.  Psychiatric:  Confused, non-agitated   Assessment/Plan: 1. 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 Physiatrist is providing close team supervision and 24 hour management of active medical problems listed below. Physiatrist and rehab team continue to assess barriers to discharge/monitor patient progress toward functional and medical goals. FIM:                   Comprehension Comprehension Mode: Auditory Comprehension: 2-Understands basic 25 - 49% of the time/requires cueing 51 - 75% of the time  Expression Expression Mode: Verbal Expression: 1-Expresses basis less than 25% of the time/requires cueing greater than 75% of the time.  Social Interaction Social Interaction: 1-Interacts appropriately less than 25% of the time. May be withdrawn or combative.  Problem Solving Problem Solving: 1-Solves basic less than 25% of the time - needs direction nearly all the time or does not effectively solve problems and may need a restraint for safety  Memory Memory:  1-Recognizes or recalls less than 25% of the time/requires cueing greater than 75% of the time  Medical Problem List and Plan:  1. 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 Plan repeat cranial CT scan 1 week if hemorrhage resolving consider anticoagulation  2. DVT Prophylaxis/Anticoagulation: SCDs. Monitor for any signs of DVT--  LE venous dopplers today 3. Pain Management: Tylenol as needed  4. Mood/anxiety: Xanax 0.25 mg each bedtime  5. Neuropsych: This patient is not capable of making decisions on her own behalf.  6. Dysphagia. Dysphagia 1 honey thick liquids. May need nighttime fluids for hydration. Followup speech therapy  7. Atrial fibrillation. No Coumadin therapy secondary to hemorrhage. Cardiac rate control. Continue Lanoxin 0.125 mg daily and Cardizem 10 mg 4 times a day as advised as well as Lopressor  100 mg 3 times a day  8. Seizure disorder. Valproate 500 mg every 8 hours. Monitor for any further seizure activity  9.HCAP. Complete 7 day course of empiric Zosyn initiated 03/24/2014  -increased leukocytosis (17K)  -recheck cxr today,  uti sens to zosyn also 10. COPD. Patient with home oxygen. Continue Pulmicort nebulizer. Check oxygen saturations every shift.  11. Type 2 diabetes mellitus. Continue sliding scale for now. Check blood sugars as directed   LOS (Days) 1 A FACE TO FACE EVALUATION WAS PERFORMED  Myldred Raju T 03/30/2014 9:02 AM

## 2014-03-30 NOTE — Progress Notes (Signed)
1535 MD/PA (Dan Angiulli) notifed regarding tube feeding placement obstruction.  T.O. Given for radiology to place tube.

## 2014-03-30 NOTE — Evaluation (Signed)
Physical Therapy Assessment and Plan  Patient Details  Name: Colleen [name] MRN: [id number] Date of Birth: Oct 26, 1951  PT Diagnosis: Cognitive deficits, Difficulty walking, Hemiparesis non-dominant, Hypotonia and Muscle weakness Rehab Potential: Fair ELOS: 28 days   Today's Date: 03/30/2014 Time: 0800-0900 Time Calculation (min): 60 min  Problem List:  Patient Active Problem List   Diagnosis Date Noted  . Intraparenchymal hemorrhage of brain 03/29/2014  . Acute hemorrhagic infarction of brain 03/26/2014  . Acute and chronic respiratory failure 03/26/2014  . HAP (hospital-acquired pneumonia) 03/24/2014  . Chronic respiratory failure with hypoxia 02/14/2014  . Chronic anticoagulation- Coumadin 01/06/2014  . H/O: CVA (cerebrovascular accident) 01/06/2014  . Normal coronary arteries- 2007 01/06/2014  . Diastolic dysfunction grade 2 by Echo 12/14 01/06/2014  . Atrial flutter 01/04/2014  . TIA (transient ischemic attack) 12/14 12/08/2013  . Diabetes mellitus 12/08/2013  . Atrial fibrillation with RVR 12/07/2013  . UNSPECIFIED DISORDER OF THYROID 10/09/2010  . HYPERLIPIDEMIA 08/08/2010  . TOBACCO ABUSE 08/08/2010  . AORTIC VALVE DISORDERS 08/08/2010  . CAROTID BRUIT 08/08/2010  . Atrial fibrillation 06/20/2010  . PALPITATIONS 06/20/2010  . DYSPNEA 06/20/2010  . CHEST PAIN 06/20/2010  . DYSPHAGIA UNSPECIFIED 06/20/2010  . HYPERTENSION, BENIGN 06/21/2008  . OBESITY 02/24/2008  . Personal history of colonic adenomas 02/05/2008  . ASTHMATIC BRONCHITIS, ACUTE 02/05/2008  . COPD 02/05/2008  . GASTROESOPHAGEAL REFLUX DISEASE 02/05/2008  . HIATAL HERNIA 02/05/2008    Past Medical History:  Past Medical History  Diagnosis Date  . Hypertension   . Hiatal hernia   . Adenomatous polyp of colon   . Acute asthmatic bronchitis   . GERD (gastroesophageal reflux disease)     EGD neg 03/07/2008, but prev required dilation  . COPD (chronic obstructive pulmonary disease)   . IBS (irritable  bowel syndrome)     chronic  . Urinary incontinence   . Esophagitis   . Chronic gastritis   . Duodenitis without hemorrhage 10/01/2001  . Diverticulosis   . Personal history of colonic adenomas 02/05/2008        . Anemia   . Hyperlipidemia   . Decreased peripheral vision of right eye   . Pneumonia 2011  . Hyperthyroidism   . Type II diabetes mellitus   . Stroke     a. Hx of stroke and ministrokes ~ 2009. b. TIA 11/2013, dx with AF at that time.  . Arthritis   . Depression   . On home oxygen therapy     "2L; suppose to use 24/7" (01/05/2014)  . PAF (paroxysmal atrial fibrillation)     a. Prior hx. b. Recurred 11/2013 in setting of possible TIA. Lifelong Coumadin planned. Spont converted to NSR. EF 60-65% by echo 11/2013, normal cors 2007, normal nuc 2011.   . Atrial flutter     a. Dx 12/2013. s/p DCCV. On anticoagulation.   Past Surgical History:  Past Surgical History  Procedure Laterality Date  . Colonoscopy    . Upper gi endoscopy    . Cholecystectomy  1980's  . Vaginal hysterectomy  1992  . Breast biopsy Left 1987    "milk gland"  . Cardioversion N/A 01/06/2014    Procedure: CARDIOVERSION;  Surgeon: Lelon Perla, MD;  Location: [facility] OR;  Service: Cardiovascular;  Laterality: N/A;    Assessment & Plan Clinical Impression:  LEAHANNA BUSER is a 63 y.o. right-handed female with history of COPD, type 2 diabetes mellitus, hypertension and atrial fibrillation with Coumadin therapy. Patient with recent right occipital infarct  on 03/16/2014. She  was seen at [facility] ; CT showed petechial hemorrhage. She was discharged to home after stopping her Coumadin. She presented to [facility] 03/18/2014 after developing severe headache with nausea vomiting and left-sided weakness. CT and imaging showed right temporal occipital region infarct with intraventricular extension into the right lateral ventricle with cerebral edema and left midline shift. She was not a TPA candidate. Noted  INR on admission of 2.61;  it was reversed. Followup cranial CT scan 03/27/2014 shows slight decrease in right intraparenchymal hematoma. Plan was to followup CT scan in one week if hemorrhage continues to resolve may consider anticoagulation. Echocardiogram with ejection fraction of 60% no wall motion abnormalities. Recent carotid Dopplers with no ICA stenosis. 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 Followup neurology services maintained on valproate 500 mg every 8 hours.HCAP and maintained on empiric Zosyn x7 days initiated 03/24/2014. Panda tube was placed for for nutritional support however patient pulled out the tube 03/28/2014.[name] Patient transferred to CIR on 03/29/2014 .   Patient currently requires total +2 with mobility secondary to decreased cardiorespiratoy endurance and decreased oxygen support and impaired timing and sequencing and abnormal tone.  Prior to hospitalization, patient was receiving CAP services for ADLS; she used a Rollator walker with assistance to ambulate, and recently was falling at night when getting up to go to BR alone, per daughter. She lived with Spouse;Daughter;Other (Comment) (92 yo Curator) in a House home.  Home access is 4Stairs to enter.  Patient will benefit from skilled PT intervention to maximize safe functional mobility, minimize fall risk and decrease caregiver burden for planned discharge home with 24 hour assist.  Anticipate patient will benefit from follow up [facility] at discharge.  PT - End of Session Activity Tolerance: Tolerates < 10 min activity, no significant change in vital signs Endurance Deficit: Yes Endurance Deficit Description: lethargic  PT Assessment Rehab Potential: Fair Barriers to Discharge: Inaccessible home environment PT Plan PT Intensity: Minimum of 1-2 x/day ,45 to 90 minutes PT Frequency: 5 out of 7 days (may need to be 15 hours over 7 days) PT Duration  Estimated Length of Stay: 28 days PT Treatment/Interventions: Balance/vestibular training;Cognitive remediation/compensation;Discharge planning;DME/adaptive equipment instruction;Functional electrical stimulation;Functional mobility training;Patient/family education;Neuromuscular re-education;Splinting/orthotics;Therapeutic Exercise;Therapeutic Activities;UE/LE Strength taining/ROM;UE/LE Coordination activities;Visual/perceptual remediation/compensation;Wheelchair propulsion/positioning;Stair training;Ambulation/gait training PT Recommendation Follow Up Recommendations: Home health PT Patient destination: Home or SNF, TBD Equipment Recommended: Wheelchair cushion (measurements);Wheelchair (measurements);To be determined  Skilled Therapeutic Intervention- treatment today: while sitting up in recliner, strengthening RLE in sitting: long arc quad extensions with assistance, 10 x 1.  Pt followed 1 step command to kick R foot up, after several passive movements.  R hip flexion with assistance, 10 x 1.  Passive stretching L hamstrings and heel cord.   PT Evaluation Precautions/Restrictions Precautions Precautions: Fall Precaution Comments: inattention to left, decreased arousal on eval, not able to follow commands, family reports dementia Restrictions Weight Bearing Restrictions: No Other Position/Activity Restrictions: hypertension day of eval; PA ok'd proceeding with eval General   Vital SignsTherapy Vitals Temp src: Axillary Pulse Rate: 75 BP: 210/100 mmHg Patient Position, if appropriate: supine with HOB raised When sitting in recliner with LEs elevated, BP 195.95 Oxygen Therapy SpO2: 95 % O2 Device: Nasal cannula O2 Flow Rate (L/min): 2 L/min Pain Pain Assessment- pt raised her R hand when asked if anything hurt; informed RN Home Living/Prior Functioning Home Living Living Arrangements: Spouse/significant other;Children Available Help at Discharge: Personal care  attendant;Available  24 hours/day;Family (CAPS CNA 5 days/wk, 6 hrs/day) Type of Home: House Home Access: Stairs to enter [employer] of Steps: 4 Entrance Stairs-Rails: None Home Layout: One level Additional Comments: PTA pt's CNA and/or family assisted patient with Rollator to a walk in shower with sliding doors and onto a shower chair without back.  Lives With: Spouse;Daughter;Other (Comment) (60 yo grandaughter) Prior Function Level of Independence: Needs assistance with gait;Needs assistance with ADLs;Needs assistance with tranfers Driving: No Comments: O2 at 2 liters at home pta, CAPS worker and family assisted patient with all BADL tasks. Daughter reports she was told that her mom had "sundowners" except she reports the behavior is all of the time. Vision/Perception  Vision - Assessment Additional Comments: Unable to fully assess due to decreased arousal.  Appeared to have right gaze preference  Cognition Overall Cognitive Status: Impaired/Different from baseline Arousal/Alertness: Lethargic Orientation Level: Oriented to person Attention: Focused Focused Attention: Impaired Focused Attention Impairment: Verbal basic;Functional basic Memory: Impaired Awareness: Impaired Problem Solving: Impaired Executive Function:  (all impaired due to lower level deficits) Safety/Judgment: Impaired Sensation Sensation Light Touch: Appears Intact (difficult to assess) Stereognosis: Not tested Hot/Cold: Not tested Proprioception: Not tested Additional Comments: BUE's NT due to decreased arousal Coordination Gross Motor Movements are Fluid and Coordinated: No (with RLE) Fine Motor Movements are Fluid and Coordinated: No (with RLE) Heel Shin Test: unable to assess Motor  Motor Motor: Hemiplegia;Abnormal tone (slight L hip ext tone detected) Motor - Skilled Clinical Observations: pt had gross ? associated reaction LUE with body movements  Mobility Bed Mobility Bed Mobility: Rolling Right;Right  Sidelying to Sit Rolling Right: 2: Max assist Rolling Right Details: Manual facilitation for placement;Manual facilitation for weight shifting Right Sidelying to Sit: 1: +2 Total assist Transfers Transfers: Yes Squat Pivot Transfers: 1: +2 Total assist Locomotion  Ambulation Ambulation: No Gait Gait: No Stairs / Additional Locomotion Stairs: No Wheelchair Mobility Wheelchair Mobility: No  Trunk/Postural Assessment  Cervical Assessment Cervical Assessment: Exceptions to [facility] Cervical Strength Overall Cervical Strength Comments: head rests in R lateral flexion and R rotation Thoracic Assessment Thoracic Assessment: Exceptions to [facility] Thoracic Strength Overall Thoracic Strength Comments: kyphotic; unable to extend to assist with sitting upright Lumbar Assessment Lumbar Assessment: Exceptions to [facility] (increased R wt bearing) Lumbar Strength Overall Lumbar Strength Comments: posterior pelvic tilt; unable to extend into neutral Postural Control Postural Control: Deficits on evaluation Head Control: none  Trunk Control: none Righting Reactions: none Protective Responses: none detected Postural Limitations: unable to reach midline or maintain sitting posture- falls to R  Balance Balance Balance Assessed: Yes Static Sitting Balance Static Sitting - Level of Assistance: 1: +1 Total assist Static Standing Balance Static Standing - Level of Assistance: 1: +2 Total assist Extremity Assessment  RUE Assessment RUE Assessment:  (PROM WFL assessed while patient sleeping) LUE Assessment LUE Assessment:  (PROM WFL assessed while patient sleeping) RLE Assessment RLE Assessment: Exceptions to [facility] RLE Strength RLE Overall Strength Comments: difficult to assess: minimally 3+/5 knee ext and ankle DF; 2-/5 ankel DF, hip flex and adduction/abd LLE Assessment LLE Assessment: Exceptions to [facility] LLE Strength LLE Overall Strength Comments: no active motors detected LLE Tone LLE Tone Comments:  hypotonic; minimal hip ext with quick stretch  FIM:  FIM - Bed/Chair Transfer Bed/Chair Transfer: 1: Two helpers;1: Supine > Sit: Total A (helper does all/Pt. < 25%);1: Sit > Supine: Total A (helper does all/Pt. < 25%)   Refer to Care Plan for Long Term Goals  Recommendations  for other services: None  Discharge Criteria: Patient will be discharged from PT if patient refuses treatment 3 consecutive times without medical reason, if treatment goals not met, if there is a change in medical status, if patient makes no progress towards goals or if patient is discharged from hospital.  The above assessment, treatment plan, treatment alternatives and goals were discussed and mutually agreed upon: No family available/patient unable  Donnalynn Wheeless 03/30/2014, 5:13 PM

## 2014-03-30 NOTE — Progress Notes (Signed)
MD/PA gave RN verbal order to hold all po medications until tube for feedings is placed.

## 2014-03-31 ENCOUNTER — Inpatient Hospital Stay (HOSPITAL_COMMUNITY): Payer: Medicaid Other | Admitting: Occupational Therapy

## 2014-03-31 ENCOUNTER — Inpatient Hospital Stay (HOSPITAL_COMMUNITY): Payer: Medicaid Other

## 2014-03-31 DIAGNOSIS — E119 Type 2 diabetes mellitus without complications: Secondary | ICD-10-CM

## 2014-03-31 DIAGNOSIS — I69991 Dysphagia following unspecified cerebrovascular disease: Secondary | ICD-10-CM

## 2014-03-31 DIAGNOSIS — R569 Unspecified convulsions: Secondary | ICD-10-CM

## 2014-03-31 DIAGNOSIS — J449 Chronic obstructive pulmonary disease, unspecified: Secondary | ICD-10-CM

## 2014-03-31 DIAGNOSIS — I472 Ventricular tachycardia: Secondary | ICD-10-CM

## 2014-03-31 DIAGNOSIS — I4729 Other ventricular tachycardia: Secondary | ICD-10-CM

## 2014-03-31 DIAGNOSIS — I6340 Cerebral infarction due to embolism of unspecified cerebral artery: Secondary | ICD-10-CM

## 2014-03-31 LAB — GLUCOSE, CAPILLARY
GLUCOSE-CAPILLARY: 97 mg/dL (ref 70–99)
GLUCOSE-CAPILLARY: 100 mg/dL — AB (ref 70–99)
GLUCOSE-CAPILLARY: 94 mg/dL (ref 70–99)
Glucose-Capillary: 84 mg/dL (ref 70–99)
Glucose-Capillary: 85 mg/dL (ref 70–99)
Glucose-Capillary: 108 mg/dL — ABNORMAL HIGH (ref 70–99)

## 2014-03-31 LAB — CBC
HCT: 43.7 % (ref 36.0–46.0)
Hemoglobin: 13 g/dL (ref 12.0–15.0)
MCH: 23 pg — AB (ref 26.0–34.0)
MCHC: 29.7 g/dL — ABNORMAL LOW (ref 30.0–36.0)
MCV: 77.5 fL — ABNORMAL LOW (ref 78.0–100.0)
PLATELETS: 263 10*3/uL (ref 150–400)
RBC: 5.64 MIL/uL — ABNORMAL HIGH (ref 3.87–5.11)
RDW: 20.9 % — ABNORMAL HIGH (ref 11.5–15.5)
WBC: 14.2 10*3/uL — ABNORMAL HIGH (ref 4.0–10.5)

## 2014-03-31 MED ORDER — RAMIPRIL 2.5 MG PO CAPS
2.5000 mg | ORAL_CAPSULE | Freq: Every day | ORAL | Status: DC
  Administered 2014-03-31 – 2014-04-03 (×4): 2.5 mg via ORAL
  Filled 2014-03-31 (×6): qty 1

## 2014-03-31 NOTE — Progress Notes (Signed)
Occupational Therapy Session Notes  Patient Details  Name: Colleen [name] MRN: [id number] Date of Birth: Jan 07, 1951  Today's Date: 03/31/2014 Time: 0800-0900 and [id number]  Time Calculation (min): 60 min and 25 min (co-tx with PT)  Short Term Goals: Week 1:  OT Short Term Goal 1 (Week 1): Patient will activity participate in self care sessions  for at least 5 min intervals OT Short Term Goal 2 (Week 1): Patient will wash face with min assist OT Short Term Goal 3 (Week 1): Patient will sit EOB with mod assist at least 50% pf the time for sitting balance during self care tasks  Skilled Therapeutic Interventions/Progress Updates:  1)  Patient sleeping in bed upon arrival.  Engaged in self care retraining to include dressing and grooming.  Patient +2 for all tasks due to decreased arousal, decreased mobility, decreased sitting balance, decreased ability to follow 1 step commands, bilateral wrist restraints untied for self care and mobility which requires +2 to monitor right hand since she has pulled out NG tube at least 2 times.  Patient was able to sit EOB for ~15 min with varied assist level from min-total assist as patient occasionally with suddenly push posterior or bolt forward.  Patient with eyes open ~ 30% of session, responded 1 time with head nod and 1 time with a verbalization (unintelligable).  Patient did not follow any commands during session despite additional time allowed to process.  While sitting EOB OT dressed patient's UB and provided some oral care.  Patient resting in bed with bilateral wrist restraints in place, HOB up and 2L O2 via Hand.  2)  Co-treat with PT.  Patient sleeping in bed upon arrival having just returned from radiology.  Engaged in bed mobility, slide board transfer to tilt in space w/c with head rest, and grooming tasks.  Focused session on keeping eyes open, engage in session to include actively participate with eyes open, and follow 1 step commands.  Patient followed  simple 1 step commands ~25% of the time, initiated use of RUE minimally during mobility, verbalized and nodded in response to yes/no questions at least 25% of the time.  Patient resting in w/c at nursing station with bilateral wrist restraints in place, 2LO2 via [place], waist and chest belt in place as well as belt to block BLEs from coming off leg rests.  Therapy Documentation Precautions:  Precautions Precautions: Fall Precaution Comments: inattention to left, decreased arousal, NPO and NG tube, bilateral wrist restraints, family reports dementia Restrictions Weight Bearing Restrictions: No Other Position/Activity Restrictions: hypertension - please check vitals Therapy Vital Signs at 9:07am: Pulse Rate: 95 BP: 199/101 mmHg (end of OT sessions) Oxygen Therapy SpO2: 97 % O2 Device: Nasal cannula O2 Flow Rate (L/min): 2 L/min Pain: No indication of pain in either session ADL: See FIM for current functional status  Therapy/Group: 1)  Individual Therapy and 2) Co-Treatment with PT  Nikko Quast 03/31/2014, 11:34 AM

## 2014-03-31 NOTE — Progress Notes (Signed)
Speech Language Pathology Daily Session Note  Patient Details  Name: AROUSH [name] MRN: [id number] Date of Birth: 27-Jan-1951  Today's Date: 03/31/2014 Time: 2703-5009 Time Calculation (min): 41 min  Short Term Goals: Week 1: SLP Short Term Goal 1 (Week 1): Pt will utilize safe swallowing strategies to consume therapeutic PO trials with Max cues SLP Short Term Goal 2 (Week 1): Pt will focus attention to auditory/tactile stimuli 75% of trials with Max cues SLP Short Term Goal 3 (Week 1): Pt will answer yes/no questions with 50% accuracy with Max cues SLP Short Term Goal 4 (Week 1): Pt will follow one-step commands throughout functional task with Max cues  Skilled Therapeutic Interventions: Treatment focused on eliciting focused attention to basic verbal and functional tasks. Pt was awake, but minimally aroused. With max cueing pt verbally responded 3x over 45 minutes, nodded head y/n in 25% of opportunities with possibly 2 responses that were meaningful/accurate. Pt sustained attention to washing face for 10 seconds after max tactile cues to initiate task. Swallow trigger more timley today with swallow initation within 3 seconds of bolus presenation with pudding thick trials, but followed by delayed congested cough indicative of aspiration. Will continue efforts.    FIM:  Comprehension Comprehension Mode: Auditory Comprehension: 1-Understands basic less than 25% of the time/requires cueing 75% of the time Expression Expression Mode: Verbal Expression: 1-Expresses basis less than 25% of the time/requires cueing greater than 75% of the time. Social Interaction Social Interaction: 1-Interacts appropriately less than 25% of the time. May be withdrawn or combative. Problem Solving Problem Solving: 1-Solves basic less than 25% of the time - needs direction nearly all the time or does not effectively solve problems and may need a restraint for safety Memory Memory: 1-Recognizes or recalls less  than 25% of the time/requires cueing greater than 75% of the time FIM - Eating Eating Activity: 1: Helper feeds patient  Pain Pain Assessment Pain Assessment: Faces Faces Pain Scale: No hurt  Therapy/Group: Individual Therapy  Mialani Reicks, Katherene Ponto 03/31/2014, 3:14 PM

## 2014-03-31 NOTE — Plan of Care (Signed)
Problem: RH BLADDER ELIMINATION Goal: RH STG MANAGE BLADDER WITH ASSISTANCE STG Manage Bladder With Assistance. Max A Outcome: Not Progressing Total A at this time     

## 2014-03-31 NOTE — Progress Notes (Signed)
Occupational Therapy Session Note  Patient Details  Name: LEEAH POLITANO MRN: [id number] Date of Birth: [birth year]  Today's Date: 03/31/2014 Time: 7035-0093 Time Calculation (min): 24 min  Short Term Goals: Week 1:  OT Short Term Goal 1 (Week 1): Patient will activity participate in self care sessions  for at least 5 min intervals OT Short Term Goal 2 (Week 1): Patient will wash face with min assist OT Short Term Goal 3 (Week 1): Patient will sit EOB with mod assist at least 50% pf the time for sitting balance during self care tasks  Skilled Therapeutic Interventions/Progress Updates:    Pt seen for 1:1 OT session with focus on arousal, following 1 step commands, and PROM. Pt received in w/c at nurses station. Completed therapy session in room. Pt with minimal eye opening and responding to multimodal cues approx 20% of therapy session. Pt responded to name 2x stating "what" with increased time. Completed PROM to BUEs and pt tolerating well. Pt following 1 step command less than 10% of therapy session. At end of session pt left with RN providing care.   Therapy Documentation Precautions:  Precautions Precautions: Fall Precaution Comments: inattention to left, decreased arousal, NPO and NG tube, bilateral wrist restraints, family reports dementia Restrictions Weight Bearing Restrictions: No Other Position/Activity Restrictions: hypertension - please check vitals General: General Amount of Missed OT Time (min): 6 Minutes Vital Signs: Therapy Vitals Pulse Rate: 80 BP: 174/113 mmHg Oxygen Therapy SpO2: 97 % O2 Device: Nasal cannula O2 Flow Rate (L/min): 2 L/min Pain: No indication of pain during therapy session.   See FIM for current functional status  Therapy/Group: Individual Therapy  Duayne Cal 03/31/2014, 11:59 AM

## 2014-03-31 NOTE — IPOC Note (Signed)
Overall Plan of Care [facility]) Patient Details Name: Colleen [name] MRN: [id number] DOB: [birth year]  Admitting Diagnosis: R BRAIN CVA VISUAL/SPATIAL DEFECTS  Hospital Problems: Active Problems:   Intraparenchymal hemorrhage of brain     Functional Problem List: Nursing Bladder;Bowel;Nutrition;Perception;Safety  PT Balance;Behavior;Endurance;Motor;Safety;Sensory  OT Balance;Behavior;Cognition;Endurance;Motor;Perception;Safety;Skin Integrity  SLP Cognition;Linguistic  TR         Basic ADL's: OT Eating;Grooming;Bathing;Dressing;Toileting     Advanced  ADL's: OT       Transfers: PT Bed Mobility;Bed to Chair;Car;Furniture  OT Toilet     Locomotion: PT Wheelchair Mobility;Ambulation;Stairs     Additional Impairments: OT    SLP Swallowing;Communication;Social Cognition comprehension;expression Problem Solving;Memory;Attention;Awareness  TR      Anticipated Outcomes Item Anticipated Outcome  Self Feeding Min Assist  Swallowing  Mod   Basic self-care  Mod Assist  Toileting  Mod Assist   Bathroom Transfers Mod Assist  Bowel/Bladder  Min assistance  Transfers  min assist  Locomotion  TBD  Communication  Mod  Cognition  Mod  Pain  Less than 3  Safety/Judgment  Supervision   Therapy Plan: PT Intensity: Minimum of 1-2 x/day ,45 to 90 minutes PT Frequency: 5 out of 7 days (may need to be 15 hours over 7 days) PT Duration Estimated Length of Stay: 28 days OT Intensity: Minimum of 1-2 x/day, 45 to 90 minutes OT Frequency: Total of 15 hours over 7 days Frequency due to: decreased arousal and lethargic OT Duration/Estimated Length of Stay: 21 days SLP Intensity: Minumum of 1-2 x/day, 30 to 90 minutes SLP Frequency: 5 out of 7 days SLP Duration/Estimated Length of Stay: 21-24 days       Team Interventions: Nursing Interventions Bladder Management;Bowel Management;Disease Management/Prevention;Dysphagia/Aspiration Precaution Training;Discharge Planning  PT  interventions Balance/vestibular training;Cognitive remediation/compensation;Discharge planning;DME/adaptive equipment instruction;Functional electrical stimulation;Functional mobility training;Patient/family education;Neuromuscular re-education;Splinting/orthotics;Therapeutic Exercise;Therapeutic Activities;UE/LE Strength taining/ROM;UE/LE Coordination activities;Visual/perceptual remediation/compensation;Wheelchair propulsion/positioning;Stair training;Ambulation/gait training  OT Interventions Balance/vestibular training;Cognitive remediation/compensation;DME/adaptive equipment instruction;Discharge planning;Functional mobility training;Neuromuscular re-education;Patient/family education;Psychosocial support;Self Care/advanced ADL retraining;Therapeutic Activities;UE/LE Strength taining/ROM  SLP Interventions Cognitive remediation/compensation;Cueing hierarchy;Dysphagia/aspiration precaution training;Environmental controls;Functional tasks;Internal/external aids;Multimodal communication approach;Patient/family education;Speech/Language facilitation;Therapeutic Activities  TR Interventions    SW/CM Interventions Discharge Planning;Psychosocial Support;Patient/Family Education    Team Discharge Planning: Destination: PT-Home ,OT- Home , SLP-Home Projected Follow-up: PT-Home health PT, OT-  Home health OT, SLP-Home Health SLP;24 hour supervision/assistance Projected Equipment Needs: PT-Wheelchair cushion (measurements);Wheelchair (measurements);To be determined, OT- To be determined, SLP-None recommended by SLP Equipment Details: PT- , OT-Patient has shower chair without a back and Rollator Patient/family involved in discharge planning: PT- Patient unable/family or caregiver not available,  OT-Family member/caregiver, SLP-Patient unable/family or caregive not available  MD ELOS: 24-28 days Medical Rehab Prognosis:  Excellent Assessment: The patient has been admitted for CIR therapies. The team will  be addressing, functional mobility, strength, stamina, balance, safety, adaptive techniques/equipment, self-care, bowel and bladder mgt, patient and caregiver education, NMR, swallowing, communication, visual perceptual awareness, cognitive perceptual awareness, medical mgt. Goals have been set at mod assist potentially for all areas given severity of deficits.    Meredith Staggers, MD, FAAPMR      See Team Conference Notes for weekly updates to the plan of care

## 2014-03-31 NOTE — Progress Notes (Signed)
Size 10 NGT replaced to right nare. Patient tolerated procedure well. X ray called to verify placement.

## 2014-03-31 NOTE — Progress Notes (Addendum)
[place] PHYSICAL MEDICINE & REHABILITATION     PROGRESS NOTE    Subjective/Complaints: Just returned from Xray, had severeal KUBs to check NGtube placement BPs remain elevated A 12 ROS limited by cognitive   Objective: Vital Signs: Blood pressure 199/101, pulse 95, temperature 97.8 F (36.6 C), temperature source Axillary, resp. rate 21, weight 67 kg (147 lb 11.3 oz), SpO2 97.00%. Dg Chest 2 View  03/30/2014   CLINICAL DATA:  63 year old female with cough and leukocytosis. Initial encounter.  EXAM: CHEST  2 VIEW  COMPARISON:  03/25/2014 and earlier.  FINDINGS: Moderate bilateral pleural effusions. Pleural fluid tracking in the fissures. Superimposed more confluent airspace opacity in the lower lobes, but appears platelike on the lateral view. Stable cardiac size and mediastinal contours. Visualized tracheal air column is within normal limits. No pneumothorax. No overt pulmonary edema. No acute osseous abnormality identified.  IMPRESSION: Primary abnormality is moderate bilateral pleural effusions. Superimposed lower lobe opacity more resembles atelectasis than pneumonia at this time.   Electronically Signed   By: Lars Pinks M.D.   On: 03/30/2014 12:28   Dg Abd 1 View  03/31/2014   CLINICAL DATA:  Advancement of nasogastric tube.  EXAM: ABDOMEN - 1 VIEW  COMPARISON:  03/31/2014 5:55 a.m.  FINDINGS: Single C-arm views submitted for review. This reveals that the nasogastric tube has been advanced. This is curled upon itself with the tip at the level of the gastric fundus and the side hole at the level of the gastric body-fundus junction.  IMPRESSION: Nasogastric tube has been advanced. This is curled upon itself with the tip at the level of the gastric fundus and the side hole at the level of the gastric body-fundus junction.   Electronically Signed   By: Chauncey Cruel M.D.   On: 03/31/2014 10:13   Dg Abd 1 View  03/31/2014   CLINICAL DATA:  Nasogastric tube placement.  EXAM: ABDOMEN - 1 VIEW   COMPARISON:  Abdominal radiograph March 31, 2014 at 2:39 a.m.  FINDINGS: Nasogastric tube tip projects in proximal stomach with side port above the gastroesophageal junction. Included bowel gas pattern is nondilated and nonobstructive. Residual contrast in the large bowel. Surgical clips in the right abdomen may reflect cholecystectomy. Punctate mid abdominal calcifications may be pancreatic. Small right pleural effusion. Left lung base atelectasis.  IMPRESSION: Nasogastric tube tip projects in proximal stomach with side port above the gastroesophageal junction. Recommend advancement.   Electronically Signed   By: Elon Alas   On: 03/31/2014 06:22   Dg Abd 1 View  03/31/2014   CLINICAL DATA:  Nasogastric tube placement.  EXAM: ABDOMEN - 1 VIEW  COMPARISON:  Donald radiograph performed 03/30/2014  FINDINGS: The nasogastric tube has been retracted, ending about the gastroesophageal junction, with the sideport at the distal esophagus. This should be advanced at least 8 cm.  A small amount of air is seen within the stomach. The visualized bowel gas pattern is unremarkable, with residual contrast again seen in the colon. Clips are noted within the right upper quadrant, reflecting prior cholecystectomy.  No acute osseous abnormalities are seen. The visualized lung bases are grossly clear.  IMPRESSION: Nasogastric tube has been retracted, seen ending about the gastroesophageal junction, with the sideport at the distal esophagus. This should be advanced at least 8 cm.   Electronically Signed   By: Garald Balding M.D.   On: 03/31/2014 03:14   Dg Abd 1 View  03/30/2014   CLINICAL DATA:  Check nasogastric catheter placement  EXAM: ABDOMEN - 1 VIEW  COMPARISON:  03/24/2014  FINDINGS: The previously seen feeding tube is been removed and a nasogastric catheter placed. It lies just beyond the gastroesophageal junction within the stomach. Contrast material is noted throughout the colon. No free air is seen. Postsurgical  changes are noted. No acute bony abnormality is noted.  IMPRESSION: Nasogastric catheter within the stomach as described.   Electronically Signed   By: Inez Catalina M.D.   On: 03/30/2014 16:57    Recent Labs  03/30/14 0602 03/31/14 0748  WBC 16.9* 14.2*  HGB 12.0 13.0  HCT 41.4 43.7  PLT 232 263    Recent Labs  03/29/14 0314 03/30/14 0602  NA 141 139  K 3.2* 3.8  CL 91* 93*  GLUCOSE 107* 126*  BUN 15 11  CREATININE 0.46* 0.40*  CALCIUM 9.0 9.2   CBG (last 3)   Recent Labs  03/31/14 0139 03/31/14 0400 03/31/14 0730  GLUCAP 97 100* 84    Wt Readings from Last 3 Encounters:  03/30/14 67 kg (147 lb 11.3 oz)  03/29/14 67.6 kg (149 lb 0.5 oz)  02/19/14 88.2 kg (194 lb 7.1 oz)    Physical Exam:  Constitutional:  Bilateral wrist restraints Slow to arouse HENT: oral mucosa pink and moist  Head: Normocephalic. atraumatic  Eyes:  Pupils reactive to light  Neck: Normal range of motion. Neck supple. No thyromegaly present.  Cardiovascular:  Cardiac rate controlled  Respiratory: scattered rhonchi, poor air movement as a whole due to decreased exp/ins effort GI: Soft. Bowel sounds are normal. She exhibits no distension.  Neurological:  Pt is lethargic Right gaze preference. Inconsistently follow commands with limited attention. She did grimace to deep palpation on the left arm and leg. Left inattention. Did see some slight spontaneous movement of the left arm and leg.no spont movement right arm . Left delt,bicep,tricep, hand, HF, KE, ADP/APF 0/5. No resting tone. Sensation 1/2 to LT, senses pain.  Skin: Skin is warm and dry.  Psychiatric:  Confused, non-agitated   Assessment/Plan: 1. 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 Physiatrist is providing close team supervision and 24 hour management of active  medical problems listed below. Physiatrist and rehab team continue to assess barriers to discharge/monitor patient progress toward functional and medical goals. Has difficulty following commands, not moving either side,but once up spontaneously kicking both legs and scratching forehead with R hand    FIM: FIM - Bathing Bathing: 1: Total-Patient completes 0-2 of 10 parts or less than 25%  FIM - Upper Body Dressing/Undressing Upper body dressing/undressing: 0: Activity did not occur (did not attempt secondary to lethargy and decreased arousal) FIM - Lower Body Dressing/Undressing Lower body dressing/undressing: 0: Activity did not occur        FIM - Bed/Chair Transfer Bed/Chair Transfer: 1: Two helpers;1: Supine > Sit: Total A (helper does all/Pt. < 25%);1: Sit > Supine: Total A (helper does all/Pt. < 25%)  FIM - Locomotion: Wheelchair Distance: 0 Locomotion: Wheelchair: 1: Total Assistance/staff pushes wheelchair (Pt<25%) FIM - Locomotion: Ambulation Locomotion: Ambulation: 0: Activity did not occur (pre-gait)  Comprehension Comprehension Mode: Auditory Comprehension: 1-Understands basic less than 25% of the time/requires cueing 75% of the time  Expression Expression Mode: Verbal Expression: 1-Expresses basis less than 25% of the time/requires cueing greater than 75% of the time.  Social Interaction Social Interaction: 1-Interacts appropriately less than 25% of  the time. May be withdrawn or combative.  Problem Solving Problem Solving: 1-Solves basic less than 25% of the time - needs direction nearly all the time or does not effectively solve problems and may need a restraint for safety  Memory Memory: 1-Recognizes or recalls less than 25% of the time/requires cueing greater than 75% of the time  Medical Problem List and Plan:  1. 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 Plan repeat cranial CT scan 1 week if hemorrhage resolving consider  anticoagulation  2. DVT Prophylaxis/Anticoagulation: SCDs. Monitor for any signs of DVT--  LE venous dopplers today 3. Pain Management: Tylenol as needed  4. Mood/anxiety: Xanax 0.25 mg each bedtime  5. Neuropsych: This patient is not capable of making decisions on her own behalf.  6. Dysphagia. Dysphagia 1 honey thick liquids. May need nighttime fluids for hydration. Followup speech therapy  7. Atrial fibrillation. No Coumadin therapy secondary to hemorrhage. Cardiac rate control. Continue Lanoxin 0.125 mg daily and Cardizem 10 mg 4 times a day as advised as well as Lopressor 100 mg 3 times a day  8. Seizure disorder. Valproate 500 mg every 8 hours. Monitor for any further seizure activity , may be causing some sedation 9.HCAP. Complete 7 day course of empiric Zosyn initiated 03/24/2014  -increased leukocytosis (17K)  -recheck cxr today,  uti sens to zosyn also 10. COPD. Patient with home oxygen. Continue Pulmicort nebulizer. Check oxygen saturations every shift.  11. Type 2 diabetes mellitus. Continue sliding scale for now. Check blood sugars as directed  12.  HTN- add ramipril LOS (Days) 2 A FACE TO FACE EVALUATION WAS PERFORMED  Charlett Blake 03/31/2014 10:19 AM

## 2014-03-31 NOTE — Progress Notes (Signed)
BP 184/105 Pulse 105 Spoke to Pam to get order for IV PRN B/P med. No new orders received at his time was instructed to give missed night medications once tube placement verified.

## 2014-03-31 NOTE — Progress Notes (Signed)
Physical Therapy Note  Patient Details  Name: Colleen [name] MRN: [id number] Date of Birth: 1951/11/04 Today's Date: 03/31/2014 1025-1045, 20 min co-tx with OT for skilled +2 for transfers, cognitive retraining  Pt missed 25 min PT due to being out for [place]. Pt has wrist restraints due to several instances of pulling out NG tube and Panda tubes.  Pt asleep/lethargic, but did open her eyes intermittently during session.  She answered "yes?" to her name a few times, and said Uh-huh agreeing to other questions appropriately.  Pt noted to perform apparently purposeful L heel slide in bed when therapist entered room. Rolling R with max assist after set-up.  Pt sat EOB with min> max assist for balance, bil feet supported on floor.  SBT to L bed> tilt-n-space w/c +2 assist; pt noted to assist minimally with R hand after placement by therapist.  Pt positioned reclined in w/c, with quick release belts at hips and chest to ensure safety due to lack of postural control.  Wrist restraints re-applied with pt in w/c.   Pt tolerated being up in w/c during session without signs of distress or discomfort.  Derrien Anschutz 03/31/2014, 4:04 PM

## 2014-03-31 NOTE — Progress Notes (Signed)
Spoke to Dr. Jeannine Kitten, regarding x-ray results. No indication in report if it was okay to use NG tube. Per telephone conversation with Dr. Jeannine Kitten, the physician or physician's assistant would make that determination. Per Silvestre Mesi, PA, okay to use NG tube

## 2014-04-01 ENCOUNTER — Inpatient Hospital Stay (HOSPITAL_COMMUNITY): Payer: Medicaid Other | Admitting: Occupational Therapy

## 2014-04-01 ENCOUNTER — Inpatient Hospital Stay (HOSPITAL_COMMUNITY): Payer: Medicaid Other

## 2014-04-01 ENCOUNTER — Encounter (HOSPITAL_COMMUNITY): Payer: Medicaid Other | Admitting: Occupational Therapy

## 2014-04-01 ENCOUNTER — Inpatient Hospital Stay (HOSPITAL_COMMUNITY): Payer: Medicaid Other | Admitting: *Deleted

## 2014-04-01 LAB — GLUCOSE, CAPILLARY
GLUCOSE-CAPILLARY: 120 mg/dL — AB (ref 70–99)
GLUCOSE-CAPILLARY: 152 mg/dL — AB (ref 70–99)
Glucose-Capillary: 115 mg/dL — ABNORMAL HIGH (ref 70–99)
Glucose-Capillary: 118 mg/dL — ABNORMAL HIGH (ref 70–99)
Glucose-Capillary: 122 mg/dL — ABNORMAL HIGH (ref 70–99)
Glucose-Capillary: 135 mg/dL — ABNORMAL HIGH (ref 70–99)
Glucose-Capillary: 137 mg/dL — ABNORMAL HIGH (ref 70–99)

## 2014-04-01 LAB — VALPROIC ACID LEVEL: VALPROIC ACID LVL: 70.9 ug/mL (ref 50.0–100.0)

## 2014-04-01 NOTE — Discharge Summary (Addendum)
DISCHARGE SUMMARY  Colleen [name]  MR#: [id number]  DOB:[birth year]  Date of Admission: 03/18/2014 Date of Discharge: 03/29/2014  Attending Physician:Malcolm Hetz T  Patient's UVO:[id number] F, NP  Consults: Neuro  PCCM >> TRH  Disposition: D/C to CIR   Follow-up Appts: Follow-up Information   Follow up with Forbes Cellar, MD. Schedule an appointment as soon as possible for a visit in 2 months. (Stroke Clinic)    Specialties:  Neurology, Radiology   Contact information:   [address] [place] [address] [phone]      Discharge Diagnoses: Acute hemorrhagic conversion of ischemic CVA of brain  Dysphagia due to CVA  Atrial fibrillation  HCAP (hospital-acquired pneumonia)  Enterobacter UTI w/ sepsis  COPD  Diabetes mellitus  Diastolic dysfunction grade 2 by Echo 12/14  Hypokalemia  HLP  Hyponatremia   Initial presentation: 63 y.o. female who presented on 03/18/2014 with a history of Hypertension; Hiatal hernia; Adenomatous polyp of colon; GERD; COPD; IBS Esophagitis; Chronic gastritis; Duodenitis without hemorrhage; Diverticulosis; Anemia; Hyperlipidemia; Hyperthyroidism; Type II diabetes mellitus; prior Stroke; Arthritis; Depression; PAF and Atrial flutter who c/o increasing left sided weakness. She was admitted to [facility] for a right occipital infarct on 9/51 felt to be embolic from A-fib. CT also showed a petechial hemorrhage and her coumadin was discontinued. Despite this, on re-presentation she was found to have hemorrhagic conversion of her ischemic infarct. INR was 2.61 despite stopping coumadin. This was reversed with Vit K.  RN noted labored respirations during the night 3/25 - increased rhonchi, wheezing and crackles, unproductive cough, given lasix. This was found to be stable during rounds by Dr. Leonie Man. Again with respiratory distress during the night 3/26. Seen by Aram Beecham; transferred back to the ICU. Suspect by ICU Team that pt was  developing pneumonia.  She was transferred out of the ICU again and onto the Hospitalist service on 3/28.   Hospital Course:  Acute hemorrhagic conversion of ischemic CVA of brain  - per Neuro - no further w/u planned - [employer] signed off  - per Neuro "repeat CT in 1 week from now - if hemorrhage is resolving at that time, can consider anticoagulation - for now, not an anticoagulation candidate secondary to hemorrhage"  - CIR consulted per PT recommendations  - slow to improve globally - to transfer to CIR   Dysphagia due to CVA  - cleared for dysphagia diet per SLP - follow intake - tolerating w/o difficulty thus far   Atrial fibrillation  - rate control accomplished - cont current medical tx - unable to anticoag due to above (see note)   HCAP (hospital-acquired pneumonia)  - RLL atelectasis vs infiltrate on cxr on 3/27  - on Zosyn empirically - to complete course in CIR   Enterobacter UTI w/ sepsis  - per UA on 3/26  - cont Zosyn - will need a 7 day course of antibiotics for this as it may be foley related (to end 4/01)  - sepsis physiology resolved   COPD  - on 2 L O2 at baseline - stable w/o acute compensation   Diabetes mellitus  - stable on sensitive sliding scale   Diastolic dysfunction grade 2 by Echo 12/14  - compensated   Hypokalemia  - cont to replace and follow   HLP  - statin   Hyponatremia  - resolved   Medication List D/C meds to be determined time of d/c from CIR  Day of Discharge BP 144/77  Pulse 92  Temp(Src)  98.2 F (36.8 C) (Axillary)  Resp 28  Ht 5\' 5"  (1.651 m)  Wt 67.6 kg (149 lb 0.5 oz)  BMI 24.80 kg/m2  SpO2 99%  Physical Exam: General: poorly communicative - somnolent - no acute respiratory distress  Lungs: Clear to auscultation bilaterally without wheezes or crackles  Cardiovascular: RRR w/o M, G, or R  Abdomen: Nontender, nondistended, soft, bowel sounds positive, no rebound, no ascites, no appreciable mass  Extremities: No  significant cyanosis, clubbing, or edema bilateral lower extremities  Time spent in discharge (includes decision making & examination of pt): >30 minutes  04/01/2014, 6:35 PM   Cherene Altes, MD [facility] Office  [phone] Pager [phone]  On-Call/Text Page:      [email or web address]      password [facility]

## 2014-04-01 NOTE — Progress Notes (Signed)
Speech Language Pathology Daily Session Note  Patient Details  Name: Colleen [name] MRN: [id number] Date of Birth: 1951/02/20  Today's Date: 04/01/2014 Time: 4098-1191 Time Calculation (min): 30 min  Short Term Goals: Week 1: SLP Short Term Goal 1 (Week 1): Pt will utilize safe swallowing strategies to consume therapeutic PO trials with Max cues SLP Short Term Goal 2 (Week 1): Pt will focus attention to auditory/tactile stimuli 75% of trials with Max cues SLP Short Term Goal 3 (Week 1): Pt will answer yes/no questions with 50% accuracy with Max cues SLP Short Term Goal 4 (Week 1): Pt will follow one-step commands throughout functional task with Max cues  Skilled Therapeutic Interventions: Treatment focused on cognitive and swallowing goals. SLP facilitated session with Max cues for arousal and focused attention, with no response from pt from tactile stimuli. Pt was responsive to auditory stimuli, including her name, ~10% of the time. SLP provided thorough oral care prior to administering PO trials of puree. Pt orally accepted bolus with brief oral manipulation followed by strong, congested cough in the setting of absent swallow reflex. RT arrived for breathing treatment and RN made aware. Pt's brother-in-law, Cornelia Copa, arrived and was provided education regarding current functional status. Continue plan of care.   FIM:  Comprehension Comprehension Mode: Auditory Comprehension: 1-Understands basic less than 25% of the time/requires cueing 75% of the time Expression Expression Mode: Verbal Expression: 1-Expresses basis less than 25% of the time/requires cueing greater than 75% of the time. Social Interaction Social Interaction: 1-Interacts appropriately less than 25% of the time. May be withdrawn or combative. Problem Solving Problem Solving: 1-Solves basic less than 25% of the time - needs direction nearly all the time or does not effectively solve problems and may need a restraint for  safety Memory Memory: 1-Recognizes or recalls less than 25% of the time/requires cueing greater than 75% of the time FIM - Eating Eating Activity: 1: Helper feeds patient  Pain Pain Assessment Faces Pain Scale: No hurt  Therapy/Group: Individual Therapy   Germain Osgood, M.A. CCC-SLP [phone]  Germain Osgood 04/01/2014, 12:41 PM

## 2014-04-01 NOTE — Progress Notes (Signed)
Physical Therapy Session Note  Patient Details  Name: Colleen [name] MRN: [id number] Date of Birth: 1951/06/24  Today's Date: 04/01/2014 Time: 11:05-12:00 (48min)   Short Term Goals: Week 1:  PT Short Term Goal 1 (Week 1): pt will roll L with max assist PT Short Term Goal 2 (Week 1): pt will perform transfer to R with assistance of 1 person PT Short Term Goal 3 (Week 1): pt will tolerate OOB x 2 hours PT Short Term Goal 4 (Week 1): pt will tolerate sitting upright  x 5 minutes with max assist of 1  Skilled Therapeutic Interventions/Progress Updates:   Tx foucsed on functional mobility, arousal activities, and stretching for optimal muscle alignment for transfers.  Overall, pt unable to actively participate in therapy today due to lethargy. Pt was able to open eyes momentarily upon initial transfer in [facility] as well as with noxious stimulus. She also verbalized "stop" and groans with various stimulation.   Pt required +2 total A for supine>sit and sliding board transfer with 0% participation.  Static sitting EOB x64min with total assist for upright posture. Manual facilitation for upright trunk and head as well as scapular retraction for increased arousal.   Arousal attempted including cold washcloth, sternal rub, nail bed pressure, swaying, music, and passive mass LE hip/knee flexion/ext. Pt able to briefly respond to noxious stimuli, but unable to keep eyes open.   Performed gentle passive heel cord and HS stretching 2x2 min each muscle group. Increased tone noted at ankle on R. Nursing asked about [facility] for contracture prevention.   No voluntary movement noted throughout tx.   Pt left up at nurses station, charge nurse assigning staff to ensure safety. Restrains replaced at wrists and R mit.      Therapy Documentation Precautions:  Precautions Precautions: Fall Precaution Comments: inattention to left, decreased arousal, NPO and NG tube, bilateral wrist restraints, family reports  dementia Restrictions Weight Bearing Restrictions: No Other Position/Activity Restrictions: hypertension - please check vitals General:   Vital Signs: Therapy Vitals Pulse Rate: 94 BP: 172/86 mmHg Oxygen Therapy SpO2: 98 % O2 Device: Nasal cannula O2 Flow Rate (L/min): 2 L/min Pain: none beyond limited noxious stimulus provided   See FIM for current functional status  Therapy/Group: Individual Therapy Kennieth Rad, PT, DPT 04/01/2014, 11:44 AM

## 2014-04-01 NOTE — Progress Notes (Signed)
[place] PHYSICAL MEDICINE & REHABILITATION     PROGRESS NOTE    Subjective/Complaints: Just returned from Xray, had severeal KUBs to check NGtube placement BPs remain elevated A 12 ROS limited by cognitive   Objective: Vital Signs: Blood pressure 172/86, pulse 94, temperature 97.3 F (36.3 C), temperature source Axillary, resp. rate 20, weight 64.3 kg (141 lb 12.1 oz), SpO2 98.00%. Dg Abd 1 View  03/31/2014   CLINICAL DATA:  Advancement of nasogastric tube.  EXAM: ABDOMEN - 1 VIEW  COMPARISON:  03/31/2014 5:55 a.m.  FINDINGS: Single C-arm views submitted for review. This reveals that the nasogastric tube has been advanced. This is curled upon itself with the tip at the level of the gastric fundus and the side hole at the level of the gastric body-fundus junction.  IMPRESSION: Nasogastric tube has been advanced. This is curled upon itself with the tip at the level of the gastric fundus and the side hole at the level of the gastric body-fundus junction.   Electronically Signed   By: Chauncey Cruel M.D.   On: 03/31/2014 10:13   Dg Abd 1 View  03/31/2014   CLINICAL DATA:  Nasogastric tube placement.  EXAM: ABDOMEN - 1 VIEW  COMPARISON:  Abdominal radiograph March 31, 2014 at 2:39 a.m.  FINDINGS: Nasogastric tube tip projects in proximal stomach with side port above the gastroesophageal junction. Included bowel gas pattern is nondilated and nonobstructive. Residual contrast in the large bowel. Surgical clips in the right abdomen may reflect cholecystectomy. Punctate mid abdominal calcifications may be pancreatic. Small right pleural effusion. Left lung base atelectasis.  IMPRESSION: Nasogastric tube tip projects in proximal stomach with side port above the gastroesophageal junction. Recommend advancement.   Electronically Signed   By: Elon Alas   On: 03/31/2014 06:22   Dg Abd 1 View  03/31/2014   CLINICAL DATA:  Nasogastric tube placement.  EXAM: ABDOMEN - 1 VIEW  COMPARISON:  [facility]  radiograph performed 03/30/2014  FINDINGS: The nasogastric tube has been retracted, ending about the gastroesophageal junction, with the sideport at the distal esophagus. This should be advanced at least 8 cm.  A small amount of air is seen within the stomach. The visualized bowel gas pattern is unremarkable, with residual contrast again seen in the colon. Clips are noted within the right upper quadrant, reflecting prior cholecystectomy.  No acute osseous abnormalities are seen. The visualized lung bases are grossly clear.  IMPRESSION: Nasogastric tube has been retracted, seen ending about the gastroesophageal junction, with the sideport at the distal esophagus. This should be advanced at least 8 cm.   Electronically Signed   By: Garald Balding M.D.   On: 03/31/2014 03:14   Dg Abd 1 View  03/30/2014   CLINICAL DATA:  Check nasogastric catheter placement  EXAM: ABDOMEN - 1 VIEW  COMPARISON:  03/24/2014  FINDINGS: The previously seen feeding tube is been removed and a nasogastric catheter placed. It lies just beyond the gastroesophageal junction within the stomach. Contrast material is noted throughout the colon. No free air is seen. Postsurgical changes are noted. No acute bony abnormality is noted.  IMPRESSION: Nasogastric catheter within the stomach as described.   Electronically Signed   By: Inez Catalina M.D.   On: 03/30/2014 16:57   Dg Addison Bailey G Tube Plc W/fl-no Rad  03/31/2014   CLINICAL DATA: to manipulate and confirm placement of tube   NASO G TUBE PLACEMENT WITH FLUORO  Fluoroscopy was utilized by the requesting physician.  No radiographic  interpretation.     Recent Labs  03/30/14 0602 03/31/14 0748  WBC 16.9* 14.2*  HGB 12.0 13.0  HCT 41.4 43.7  PLT 232 263    Recent Labs  03/30/14 0602  NA 139  K 3.8  CL 93*  GLUCOSE 126*  BUN 11  CREATININE 0.40*  CALCIUM 9.2   CBG (last 3)   Recent Labs  04/01/14 0422 04/01/14 0752 04/01/14 1106  GLUCAP 120* 152* 122*    Wt Readings  from Last 3 Encounters:  04/01/14 64.3 kg (141 lb 12.1 oz)  03/29/14 67.6 kg (149 lb 0.5 oz)  02/19/14 88.2 kg (194 lb 7.1 oz)    Physical Exam:  Constitutional:  Bilateral wrist restraints Slow to arouse HENT: oral mucosa pink and moist  Head: Normocephalic. atraumatic  Eyes:  Closed will not open to command Neck: Normal range of motion. Neck supple. No thyromegaly present.  Cardiovascular:  Cardiac rate controlled  Respiratory: scattered rhonchi, poor air movement as a whole due to decreased exp/ins effort GI: Soft. Bowel sounds are normal. She exhibits no distension.  Neurological:  Pt is lethargic. Left delt,bicep,tricep, hand, HF, KE, ADP/APF 0/5. No resting tone. Sensation 1/2 to LT, senses pain.  Skin: Skin is warm and dry.  Psychiatric:  lethargy   Assessment/Plan: 1. 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 Physiatrist is providing close team supervision and 24 hour management of active medical problems listed below. Physiatrist and rehab team continue to assess barriers to discharge/monitor patient progress toward functional and medical goals. Poor arousal, recheck CT head and if no sig changes trial ritalin    FIM: FIM - Bathing Bathing:  (scheduled for evening bath)  FIM - Upper Body Dressing/Undressing Upper body dressing/undressing: 1: Two helpers FIM - Lower Body Dressing/Undressing Lower body dressing/undressing: 1: Total-Patient completed less than 25% of tasks (bed level)        FIM - Control and instrumentation engineer Devices: Sliding board Bed/Chair Transfer: 1: Supine > Sit: Total A (helper does all/Pt. < 25%);1: Bed > Chair or W/C: Total A (helper does all/Pt. < 25%);1: Two helpers  FIM - Locomotion: Wheelchair Distance: 0 Locomotion: Wheelchair: 1: Total Assistance/staff  pushes wheelchair (Pt<25%) FIM - Locomotion: Ambulation Locomotion: Ambulation: 0: Activity did not occur  Comprehension Comprehension Mode: Auditory Comprehension: 2-Understands basic 25 - 49% of the time/requires cueing 51 - 75% of the time  Expression Expression Mode: Verbal Expression: 1-Expresses basis less than 25% of the time/requires cueing greater than 75% of the time.  Social Interaction Social Interaction: 1-Interacts appropriately less than 25% of the time. May be withdrawn or combative.  Problem Solving Problem Solving: 1-Solves basic less than 25% of the time - needs direction nearly all the time or does not effectively solve problems and may need a restraint for safety  Memory Memory: 1-Recognizes or recalls less than 25% of the time/requires cueing greater than 75% of the time  Medical Problem List and Plan:  1. 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 Plan repeat cranial CT scan 1 week if hemorrhage resolving consider anticoagulation  2. DVT Prophylaxis/Anticoagulation: SCDs. Monitor for any signs of DVT--  LE venous dopplers today 3. Pain Management: Tylenol as needed  4. Mood/anxiety: Xanax 0.25 mg each bedtime  5. Neuropsych: This patient is not capable of making decisions on her own behalf.  6. Dysphagia. Dysphagia 1 honey  thick liquids. May need nighttime fluids for hydration. Followup speech therapy  7. Atrial fibrillation. No Coumadin therapy secondary to hemorrhage. Cardiac rate control. Continue Lanoxin 0.125 mg daily and Cardizem 10 mg 4 times a day as advised as well as Lopressor 100 mg 3 times a day  8. Seizure disorder. Valproate 500 mg every 8 hours. Monitor for any further seizure activity , may be causing some sedation, check level 9.HCAP. Complete 7 day course of empiric Zosyn initiated 03/24/2014  -improving leukocytosis    10. COPD. Patient with home oxygen. Continue Pulmicort nebulizer. Check oxygen saturations every  shift.  11. Type 2 diabetes mellitus. Continue sliding scale for now. Check blood sugars as directed  12.  HTN- add ramipril LOS (Days) 3 A FACE TO FACE EVALUATION WAS PERFORMED  KIRSTEINS,ANDREW E 04/01/2014 11:30 AM

## 2014-04-01 NOTE — Progress Notes (Signed)
Occupational Therapy Session Note  Patient Details  Name: Colleen [name] MRN: [id number] Date of Birth: [birth year]  Today's Date: 04/01/2014 Time: 0800-0900  Time Calculation (min): 60 min and missed 45 min second session due to down for CT  Short Term Goals: Week 1:  OT Short Term Goal 1 (Week 1): Patient will activity participate in self care sessions  for at least 5 min intervals OT Short Term Goal 2 (Week 1): Patient will wash face with min assist OT Short Term Goal 3 (Week 1): Patient will sit EOB with mod assist at least 50% pf the time for sitting balance during self care tasks  Skilled Therapeutic Interventions/Progress Updates:  1)  Patient sleeping in bed upon arrival.  Engaged in self care session.  Focused session on arousal,  participation, following one step commands.  Patient provided 0% assistance with self care session, did not open eyes follow commands, verbalize or actively participate.    2)  Missed 45 min second session secondary to patient down for   Therapy Documentation Precautions:  Precautions Precautions: Fall Precaution Comments: inattention to left, decreased arousal, NPO and NG tube, bilateral wrist restraints, family reports dementia Restrictions Weight Bearing Restrictions: No Other Position/Activity Restrictions: hypertension - please check vitals Pain: No indication of pain ADL: See FIM for current functional status  Therapy/Group: Individual Therapy both sessions  [place]rence [place], [place] 04/01/2014, 10:22 AM

## 2014-04-02 ENCOUNTER — Inpatient Hospital Stay (HOSPITAL_COMMUNITY): Payer: Medicaid Other | Admitting: Speech Pathology

## 2014-04-02 ENCOUNTER — Inpatient Hospital Stay (HOSPITAL_COMMUNITY): Payer: Medicaid Other | Admitting: Occupational Therapy

## 2014-04-02 ENCOUNTER — Inpatient Hospital Stay (HOSPITAL_COMMUNITY): Payer: Medicaid Other | Admitting: Rehabilitation

## 2014-04-02 LAB — GLUCOSE, CAPILLARY
GLUCOSE-CAPILLARY: 107 mg/dL — AB (ref 70–99)
GLUCOSE-CAPILLARY: 143 mg/dL — AB (ref 70–99)
Glucose-Capillary: 137 mg/dL — ABNORMAL HIGH (ref 70–99)
Glucose-Capillary: 135 mg/dL — ABNORMAL HIGH (ref 70–99)
Glucose-Capillary: 119 mg/dL — ABNORMAL HIGH (ref 70–99)

## 2014-04-02 MED ORDER — METHYLPHENIDATE HCL 5 MG PO TABS
5.0000 mg | ORAL_TABLET | Freq: Two times a day (BID) | ORAL | Status: DC
  Administered 2014-04-02 – 2014-04-03 (×3): 5 mg via ORAL
  Filled 2014-04-02 (×3): qty 1

## 2014-04-02 NOTE — Progress Notes (Signed)
Speech Language Pathology Daily Session Note  Patient Details  Name: Colleen [name] MRN: [id number] Date of Birth: Feb 17, 1951  Today's Date: 04/02/2014 Time: 3810-1751 Time Calculation (min): 41 min  Short Term Goals: Week 1: SLP Short Term Goal 1 (Week 1): Pt will utilize safe swallowing strategies to consume therapeutic PO trials with Max cues SLP Short Term Goal 2 (Week 1): Pt will focus attention to auditory/tactile stimuli 75% of trials with Max cues SLP Short Term Goal 3 (Week 1): Pt will answer yes/no questions with 50% accuracy with Max cues SLP Short Term Goal 4 (Week 1): Pt will follow one-step commands throughout functional task with Max cues  Skilled Therapeutic Interventions: Therapeutic intervention complete with short term goals addressed.  Despite maximum verbal, tactile stimulae, the patient did not respond to any modalities applied.  She did, however, wrinkle nose when olefactory stimulus applied (alcohol swab held under nose) but only briefly.  Nursing stated that she did verbally respond to brother-in-law's voice briefly yesterday.  Continue with current treatment plan   FIM:     Pain Pain Assessment Pain Assessment:  (Did not appear to be in pain.) Faces Pain Scale: No hurt  Therapy/Group: Individual Therapy  Frances Maywood 04/02/2014, 10:53 AM

## 2014-04-02 NOTE — Progress Notes (Signed)
Physical Therapy Session Note  Patient Details  Name: Colleen [name] MRN: [id number] Date of Birth: 11/05/1951  Today's Date: 04/02/2014 Time: 0737-1062 Time Calculation (min): 28 min  Short Term Goals: Week 1:  PT Short Term Goal 1 (Week 1): pt will roll L with max assist PT Short Term Goal 2 (Week 1): pt will perform transfer to R with assistance of 1 person PT Short Term Goal 3 (Week 1): pt will tolerate OOB x 2 hours PT Short Term Goal 4 (Week 1): pt will tolerate sitting upright  x 5 minutes with max assist of 1  Skilled Therapeutic Interventions/Progress Updates:   Pt received sitting in w/c at nursing station, noted to be extremely lethargic.  Assisted pt back to room in order to work on pts level of arousal and response to stimuli.  Pt did not respond to verbal, noxious, loud noises or painful stimuli during session.  Did provide 1 min R SCM/levator stretch due to pt keeping head tilted to the R.  Attempted to have pt track her eyes to verbal and tactile stimuli, however she did not respond.  Assisted pt back into bed via slideboard transfer at +2 total assist (pt assist 0%).  Once in bed, pt did track eyes to PT and to RN in room, however quickly shut eyes again and fell asleep.  Pt positioned in L semi sidelying to alleviate pressure from buttocks/sacrum.  Also positioned head to more midline position.  Wrist restraints applied, R mit applied, 3 bedrails up and bed alarm set.    Therapy Documentation Precautions:  Precautions Precautions: Fall Precaution Comments: inattention to left, decreased arousal, NPO and NG tube, bilateral wrist restraints, family reports dementia Restrictions Weight Bearing Restrictions: No Other Position/Activity Restrictions: hypertension - please check vitals General: Amount of Missed PT Time (min): 32 Minutes Missed Time Reason: Patient fatigue   Pain: Pain Assessment Pain Assessment:  (Did not appear to be in pain.)     See FIM for current  functional status  Therapy/Group: Individual Therapy  Denice Bors 04/02/2014, 1:32 PM

## 2014-04-02 NOTE — Progress Notes (Signed)
Occupational Therapy Session Note  Patient Details  Name: Colleen [name] MRN: [id number] Date of Birth: [birth year]  Today's Date: 04/02/2014 Time: 0800-0845 Time Calculation (min): 45 min  Short Term Goals: Week 1:  OT Short Term Goal 1 (Week 1): Patient will activity participate in self care sessions  for at least 5 min intervals OT Short Term Goal 2 (Week 1): Patient will wash face with min assist OT Short Term Goal 3 (Week 1): Patient will sit EOB with mod assist at least 50% pf the time for sitting balance during self care tasks  Skilled Therapeutic Interventions/Progress Updates:    Pt seen this session to facilitate arousal through self care training. 2 person assist for al bed mobility, sitting EOB, slide board transfers and dressing with no pt effort as she only opened her eyes briefly 4x.  Once pt was in w/c, used hand over hand guiding to wash her face with cold wash cloth, but no response. Pt's hands were washed in very cold water, hair brushed, mouth cleansed and suctioned with no active response. During oral care, her mouth was clenched tightly and it was difficult to passively open her mouth.  She has had hair washed with shampoo cap and brushing of hair with no eye opening or response.  QRBs applied. Her SLP arrrived for her next session.  Therapy Documentation Precautions:  Precautions Precautions: Fall Precaution Comments: inattention to left, decreased arousal, NPO and NG tube, bilateral wrist restraints, family reports dementia Restrictions Weight Bearing Restrictions: No Other Position/Activity Restrictions: hypertension - please check vitals    Vital Signs: Therapy Vitals Pulse Rate: 78 BP: 147/86 mmHg Pain: Pain Assessment Pain Assessment: Faces Faces Pain Scale: No hurt ADL:  See FIM for current functional status  Therapy/Group: Individual Therapy  [place] 04/02/2014, 9:07 AM

## 2014-04-02 NOTE — Progress Notes (Signed)
[place] PHYSICAL MEDICINE & REHABILITATION     PROGRESS NOTE    Subjective/Complaints: No new issues per staff, remains lethargic A 12 ROS limited by cognitive   Objective: Vital Signs: Blood pressure 147/86, pulse 78, temperature 99 F (37.2 C), temperature source Oral, resp. rate 20, weight 66.9 kg (147 lb 7.8 oz), SpO2 97.00%. Ct Head Wo Contrast  04/01/2014   CLINICAL DATA:  Lethargy, CVA. Evaluate for increasing cerebral edema.  EXAM: CT HEAD WITHOUT CONTRAST  TECHNIQUE: Contiguous axial images were obtained from the base of the skull through the vertex without intravenous contrast.  COMPARISON:  CT HEAD W/O CM dated 03/27/2014  FINDINGS: A 3.0 x 6.5 cm hematoma is again seen in the right temporal and occipital lobes, stable. Surrounding vasogenic edema appears minimally increased. Right to left midline shift measures 6 mm, minimally increased from the prior exam. Periventricular low attenuation is again seen. No mass lesion. Lateral ventricular size is stable and there is a trace amount of hemorrhage seen dependently in the left lateral ventricle, slightly less than on the prior study. Sulcal effacement is again seen in the right cerebral hemisphere.  Small right mastoid effusion is stable. No air-fluid levels in the paranasal sinuses. Mucosal thickening in the maxillary sinuses.  IMPRESSION: 1. Stable hematoma in the right temporal and occipital lobes with slight increase in surrounding vasogenic edema and subfalcine herniation. 2. Stable lateral ventricular size with trace hemorrhage in the left lateral ventricle, decreased from the prior exam. 3. Periventricular low attenuation is indicative of chronic microvascular white matter ischemic changes. 4. Small right mastoid effusion, stable.   Electronically Signed   By: Lorin Picket M.D.   On: 04/01/2014 13:34    Recent Labs  03/31/14 0748  WBC 14.2*  HGB 13.0  HCT 43.7  PLT 263   No results found for this basename: NA, K, CL, CO,  GLUCOSE, BUN, CREATININE, CALCIUM,  in the last 72 hours CBG (last 3)   Recent Labs  04/01/14 2340 04/02/14 0417 04/02/14 0751  GLUCAP 135* 137* 135*    Wt Readings from Last 3 Encounters:  04/02/14 66.9 kg (147 lb 7.8 oz)  03/29/14 67.6 kg (149 lb 0.5 oz)  02/19/14 88.2 kg (194 lb 7.1 oz)    Physical Exam:  Constitutional:  Bilateral wrist restraints Slow to arouse HENT: oral mucosa pink and moist  Head: Normocephalic. atraumatic  Eyes:  Closed will not open to command Neck: Normal range of motion. Neck supple. No thyromegaly present.  Cardiovascular:  Cardiac rate controlled  Respiratory: scattered rhonchi, poor air movement as a whole due to decreased exp/ins effort GI: Soft. Bowel sounds are normal. She exhibits no distension.  Neurological:  Pt is lethargic. Left delt,bicep,tricep, hand, HF, KE, ADP/APF 0/5. No resting tone. Sensation 1/2 to LT, senses pain.  Skin: Skin is warm and dry.  Psychiatric:  lethargy   Assessment/Plan: 1. 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 Physiatrist is providing close team supervision and 24 hour management of active medical problems listed below. Physiatrist and rehab team continue to assess barriers to discharge/monitor patient progress toward functional and medical goals. Poor arousal, recheck CT head, no sig changes trial ritalin    FIM: FIM - Bathing Bathing:  (scheduled for evening bath)  FIM - Upper Body Dressing/Undressing Upper body dressing/undressing: 1: Two helpers FIM - Lower Body Dressing/Undressing Lower body dressing/undressing: 1:  Two helpers        FIM - Financial planner Transfer: 1: Two helpers  FIM - Locomotion: Wheelchair Distance: 0 Locomotion: Wheelchair: 1: Total  Assistance/staff pushes wheelchair (Pt<25%) FIM - Locomotion: Ambulation Locomotion: Ambulation: 0: Activity did not occur  Comprehension Comprehension Mode: Auditory Comprehension: 1-Understands basic less than 25% of the time/requires cueing 75% of the time  Expression Expression Mode: Verbal Expression: 1-Expresses basis less than 25% of the time/requires cueing greater than 75% of the time.  Social Interaction Social Interaction: 1-Interacts appropriately less than 25% of the time. May be withdrawn or combative.  Problem Solving Problem Solving: 1-Solves basic less than 25% of the time - needs direction nearly all the time or does not effectively solve problems and may need a restraint for safety  Memory Memory: 1-Recognizes or recalls less than 25% of the time/requires cueing greater than 75% of the time  Medical Problem List and Plan:  1. 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 Plan repeat cranial CT scan 1 week if hemorrhage resolving consider anticoagulation  2. DVT Prophylaxis/Anticoagulation: SCDs. Monitor for any signs of DVT--  LE venous dopplers today 3. Pain Management: Tylenol as needed  4. Mood/anxiety: Xanax 0.25 mg each bedtime  5. Neuropsych: This patient is not capable of making decisions on her own behalf.  6. Dysphagia. Dysphagia 1 honey thick liquids. May need nighttime fluids for hydration. Followup speech therapy  7. Atrial fibrillation. No Coumadin therapy secondary to hemorrhage. Cardiac rate control. Continue Lanoxin 0.125 mg daily and Cardizem 10 mg 4 times a day as advised as well as Lopressor 100 mg 3 times a day  8. Seizure disorder. Valproate 500 mg every 8 hours. Monitor for any further seizure activity , may be causing some sedation, check level 9.HCAP. Complete 7 day course of empiric Zosyn initiated 03/24/2014  -improving leukocytosis    10. COPD. Patient with home oxygen. Continue Pulmicort nebulizer. Check oxygen  saturations every shift.  11. Type 2 diabetes mellitus. Continue sliding scale for now. Check blood sugars as directed  12.  HTN- add ramipril LOS (Days) 4 A FACE TO FACE EVALUATION WAS PERFORMED  Charlett Blake 04/02/2014 10:57 AM

## 2014-04-03 ENCOUNTER — Inpatient Hospital Stay (HOSPITAL_COMMUNITY): Payer: Medicaid Other | Admitting: *Deleted

## 2014-04-03 ENCOUNTER — Inpatient Hospital Stay (HOSPITAL_COMMUNITY): Payer: Medicaid Other

## 2014-04-03 ENCOUNTER — Encounter (HOSPITAL_COMMUNITY): Payer: Medicaid Other

## 2014-04-03 LAB — GLUCOSE, CAPILLARY
GLUCOSE-CAPILLARY: 114 mg/dL — AB (ref 70–99)
GLUCOSE-CAPILLARY: 116 mg/dL — AB (ref 70–99)
GLUCOSE-CAPILLARY: 119 mg/dL — AB (ref 70–99)
GLUCOSE-CAPILLARY: 133 mg/dL — AB (ref 70–99)
GLUCOSE-CAPILLARY: 134 mg/dL — AB (ref 70–99)
Glucose-Capillary: 105 mg/dL — ABNORMAL HIGH (ref 70–99)

## 2014-04-03 NOTE — Progress Notes (Signed)
Occupational Therapy Session Note  Patient Details  Name: Colleen [name] MRN: [id number] Date of Birth: December 06, 1951  Today's Date: 04/03/2014 Time: 1100-1145 Time Calculation (min): 45 min  Short Term Goals: Week 1:  OT Short Term Goal 1 (Week 1): Patient will activity participate in self care sessions  for at least 5 min intervals OT Short Term Goal 2 (Week 1): Patient will wash face with min assist OT Short Term Goal 3 (Week 1): Patient will sit EOB with mod assist at least 50% pf the time for sitting balance during self care tasks  Skilled Therapeutic Interventions/Progress Updates:     Pt in bed with eyes closed upon arrival.  Pt's only response was to pressure at nail beds and patient grimaced.  Pt incontinent of bowel and bladder. Pt required tot A + 2 for rolling in bed and hygiene.  During rolling in bed and while lying on side patient opened eyes and they remained open for approx 10 mins while hygiene was performed.  Pt did not initiate any tasks when commanded and required tot A to raised legs to assist with rolling in bed.  Pt closed eyes for remainder of session and did not respond to any stimuli or during continued rolling to don pants. Focus on arousal, task initiation, bed mobility, active participation in BADLs and following one step commands. Pt remained in bed with wrist restraints on, R mit on, and bed alarm set.   Therapy Documentation Precautions:  Precautions Precautions: Fall Precaution Comments: inattention to left, decreased arousal, NPO and NG tube, bilateral wrist restraints, family reports dementia Restrictions Weight Bearing Restrictions: No Other Position/Activity Restrictions: hypertension - please check vitals General: General Amount of Missed OT Time (min): 15 Minutes Missed Time Reason: Patient fatigue Pain: Pain Assessment Pain Assessment: Faces Faces Pain Scale: Hurts even more (w/ painful stimuli) See FIM for current functional  status  Therapy/Group: Individual Therapy  Leroy Libman 04/03/2014, 11:56 AM

## 2014-04-03 NOTE — Progress Notes (Signed)
Physical Therapy Session Note  Patient Details  Name: Colleen [name] MRN: [id number] Date of Birth: 01-26-1951  Today's Date: 04/03/2014 Time: 5176-1607 Time Calculation (min): 34 min  Short Term Goals: Week 1:  PT Short Term Goal 1 (Week 1): pt will roll L with max assist PT Short Term Goal 2 (Week 1): pt will perform transfer to R with assistance of 1 person PT Short Term Goal 3 (Week 1): pt will tolerate OOB x 2 hours PT Short Term Goal 4 (Week 1): pt will tolerate sitting upright  x 5 minutes with max assist of 1  Skilled Therapeutic Interventions/Progress Updates:    Patient received supine in bed. Session focused on increasing arousal/alertness, focused attention, response to stimuli (auditory, olfactory, temperature, and pain), command following, and sitting balance; see details for [facility] protocol below. Patient initially not responding to any verbal or tactile stimuli and maintains eyes closed. In supine, passive ROM to B hips/knees/ankles and patient opening eyes and grimacing with passive hip IR/ER bilaterally. Patient with appropriate response to painful stimuli of nail bed pressure on all 4 extremities, withdrawing each limb with pressure; patient maintaining eyes open, but does not track to therapist.   Sitting EOB, emphasis on focused attention to therapist and various functional objects (brush, wash cloth, etc.) with HOH/total assist to follow commands. Patient maintains eyes open for approx 25 min seated EOB, but does not demonstrate any focused attention or visual tracking. Patient left supine in bed, pillows positioned for B UE support and under B LEs to float heels; R mitt and B wrist restraints donned, RN present.  Stimuli introduced     Response  Auditory:   Name called    No response       Sound     No response       Hands Clapping    No response   Olfactory:  Coffee     No response (likely due to mouth breathing)                   Alcohol Swab    No response (likely due  to mouth breathing)  Pain:        Sternal rub    No response                   Nail Bed pressure    Withdraw on all 4 limbs  Temperature: Bottom of feet/ sides of face        Cold     No response   Therapy Documentation Precautions:  Precautions Precautions: Fall Precaution Comments: inattention to left, decreased arousal, NPO and NG tube, bilateral wrist restraints, family reports dementia Restrictions Weight Bearing Restrictions: No Other Position/Activity Restrictions: hypertension - please check vitals General: Amount of Missed PT Time (min): 26 Minutes Missed Time Reason: Patient fatigue Pain: Pain Assessment Pain Assessment: Faces Faces Pain Scale: Hurts even more (w/ painful stimuli) Locomotion : Ambulation Ambulation/Gait Assistance: Not tested (comment)   See FIM for current functional status  Therapy/Group: Individual Therapy  Lillia Abed. Achillies Buehl, PT, DPT 04/03/2014, 8:36 AM

## 2014-04-03 NOTE — Progress Notes (Addendum)
1300:  Pt's son and husband at [facility], reporting pt's other son was killed Friday in a MVA; pt. unaware.  Lorre Nick, MSW made  aware.  1630:   Dr. Letta Pate called for renewal of restraint order; made aware of temp. ( 100.1) and  CXR results; U/A- C/S ordered, unable to obtain as of yet d/t incontinence.  Monitor.

## 2014-04-03 NOTE — Progress Notes (Signed)
Colleen [name] PHYSICAL MEDICINE & REHABILITATION     PROGRESS NOTE    Subjective/Complaints: No new issues per staff, PT notes a good session tolerated 25-35 min sitting at EOB with eyes open Now somnolent in bed again A 12 ROS limited by cognitive   Objective: Vital Signs: Blood pressure 136/82, pulse 83, temperature 99.3 F (37.4 C), temperature source Oral, resp. rate 20, weight 69.6 kg (153 lb 7 oz), SpO2 98.00%. Ct Head Wo Contrast  04/01/2014   CLINICAL DATA:  Lethargy, CVA. Evaluate for increasing cerebral edema.  EXAM: CT HEAD WITHOUT CONTRAST  TECHNIQUE: Contiguous axial images were obtained from the base of the skull through the vertex without intravenous contrast.  COMPARISON:  CT HEAD W/O CM dated 03/27/2014  FINDINGS: A 3.0 x 6.5 cm hematoma is again seen in the right temporal and occipital lobes, stable. Surrounding vasogenic edema appears minimally increased. Right to left midline shift measures 6 mm, minimally increased from the prior exam. Periventricular low attenuation is again seen. No mass lesion. Lateral ventricular size is stable and there is a trace amount of hemorrhage seen dependently in the left lateral ventricle, slightly less than on the prior study. Sulcal effacement is again seen in the right cerebral hemisphere.  Small right mastoid effusion is stable. No air-fluid levels in the paranasal sinuses. Mucosal thickening in the maxillary sinuses.  IMPRESSION: 1. Stable hematoma in the right temporal and occipital lobes with slight increase in surrounding vasogenic edema and subfalcine herniation. 2. Stable lateral ventricular size with trace hemorrhage in the left lateral ventricle, decreased from the prior exam. 3. Periventricular low attenuation is indicative of chronic microvascular white matter ischemic changes. 4. Small right mastoid effusion, stable.   Electronically Signed   By: Lorin Picket M.D.   On: 04/01/2014 13:34   No results found for this basename: WBC, HGB,  HCT, PLT,  in the last 72 hours No results found for this basename: NA, K, CL, CO, GLUCOSE, BUN, CREATININE, CALCIUM,  in the last 72 hours CBG (last 3)   Recent Labs  04/03/14 0009 04/03/14 0400 04/03/14 0820  GLUCAP 133* 119* 134*    Wt Readings from Last 3 Encounters:  04/03/14 69.6 kg (153 lb 7 oz)  03/29/14 67.6 kg (149 lb 0.5 oz)  02/19/14 88.2 kg (194 lb 7.1 oz)    Physical Exam:  Constitutional:  Bilateral wrist restraints Slow to arouse HENT: oral mucosa pink and moist  Head: Normocephalic. atraumatic  Eyes:  Closed will not open to command Neck: Normal range of motion. Neck supple. No thyromegaly present.  Cardiovascular:  Cardiac rate controlled  Respiratory: scattered rhonchi, poor air movement as a whole due to decreased exp/ins effort GI: Soft. Bowel sounds are normal. She exhibits no distension.  Neurological:  Pt is lethargic. Left delt,bicep,tricep, hand, HF, KE, ADP/APF 0/5. No resting tone. Sensation 1/2 to LT, senses pain L>R  Skin: Skin is warm and dry.  Psychiatric:  lethargy   Assessment/Plan: 1. 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 Physiatrist is providing close team supervision and 24 hour management of active medical problems listed below. Physiatrist and rehab team continue to assess barriers to discharge/monitor patient progress toward functional and medical goals. Poor prognosis overall for functional improivement, admission to focus on reducing burden of care   FIM: FIM - Bathing Bathing:  (scheduled for evening bath)  FIM - Upper  Body Dressing/Undressing Upper body dressing/undressing: 1: Two helpers FIM - Lower Body Dressing/Undressing Lower body dressing/undressing: 1: Two helpers        FIM - Control and instrumentation engineer Devices: HOB  elevated Bed/Chair Transfer: 1: Two helpers  FIM - Locomotion: Wheelchair Distance: 0 Locomotion: Wheelchair: 0: Activity did not occur FIM - Locomotion: Ambulation Ambulation/Gait Assistance: Not tested (comment) Locomotion: Ambulation: 0: Activity did not occur  Comprehension Comprehension Mode: Auditory Comprehension: 1-Understands basic less than 25% of the time/requires cueing 75% of the time  Expression Expression Mode: Verbal Expression: 1-Expresses basis less than 25% of the time/requires cueing greater than 75% of the time.  Social Interaction Social Interaction: 1-Interacts appropriately less than 25% of the time. May be withdrawn or combative.  Problem Solving Problem Solving: 1-Solves basic less than 25% of the time - needs direction nearly all the time or does not effectively solve problems and may need a restraint for safety  Memory Memory: 1-Recognizes or recalls less than 25% of the time/requires cueing greater than 75% of the time  Medical Problem List and Plan:  1. 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 Plan repeat cranial CT scan 1 week if hemorrhage resolving consider anticoagulation  2. DVT Prophylaxis/Anticoagulation: SCDs. Monitor for any signs of DVT--  LE venous dopplers today 3. Pain Management: Tylenol as needed  4. Mood/anxiety: Xanax 0.25 mg each bedtime  5. Neuropsych: This patient is not capable of making decisions on her own behalf.  6. Dysphagia. Dysphagia 1 honey thick liquids. May need nighttime fluids for hydration. Followup speech therapy  7. Atrial fibrillation. No Coumadin therapy secondary to hemorrhage. Cardiac rate control. Continue Lanoxin 0.125 mg daily and Cardizem 10 mg 4 times a day as advised as well as Lopressor 100 mg 3 times a day  8. Seizure disorder. Valproate 500 mg every 8 hours. Monitor for any further seizure activity , may be causing some sedation, check level 9.HCAP. Complete 7 day course of  empiric Zosyn initiated 03/24/2014  -improving leukocytosis    10. COPD. Patient with home oxygen. Continue Pulmicort nebulizer. Check oxygen saturations every shift.RR increased check CXR high risk for aspiration afeb on Zosyn 11. Type 2 diabetes mellitus. Continue sliding scale for now. Check blood sugars as directed  12.  HTN- add ramipril LOS (Days) 5 A FACE TO FACE EVALUATION WAS PERFORMED  Colleen [name] 04/03/2014 9:11 AM

## 2014-04-04 ENCOUNTER — Inpatient Hospital Stay (HOSPITAL_COMMUNITY): Payer: Medicaid Other

## 2014-04-04 ENCOUNTER — Encounter (HOSPITAL_COMMUNITY): Payer: Self-pay | Admitting: Internal Medicine

## 2014-04-04 ENCOUNTER — Inpatient Hospital Stay (HOSPITAL_COMMUNITY)
Admission: AD | Admit: 2014-04-04 | Discharge: 2014-04-14 | DRG: 004 | Disposition: A | Discharge status: Skilled Nursing Facility | Payer: Medicaid Other | Source: Other Acute Inpatient Hospital | Attending: Internal Medicine | Admitting: Internal Medicine

## 2014-04-04 ENCOUNTER — Encounter (HOSPITAL_COMMUNITY): Payer: Medicaid Other | Admitting: Occupational Therapy

## 2014-04-04 DIAGNOSIS — J209 Acute bronchitis, unspecified: Secondary | ICD-10-CM

## 2014-04-04 DIAGNOSIS — R1310 Dysphagia, unspecified: Secondary | ICD-10-CM

## 2014-04-04 DIAGNOSIS — R0902 Hypoxemia: Secondary | ICD-10-CM

## 2014-04-04 DIAGNOSIS — K449 Diaphragmatic hernia without obstruction or gangrene: Secondary | ICD-10-CM

## 2014-04-04 DIAGNOSIS — R4789 Other speech disturbances: Secondary | ICD-10-CM | POA: Diagnosis present

## 2014-04-04 DIAGNOSIS — E119 Type 2 diabetes mellitus without complications: Secondary | ICD-10-CM

## 2014-04-04 DIAGNOSIS — R4182 Altered mental status, unspecified: Secondary | ICD-10-CM

## 2014-04-04 DIAGNOSIS — Z860100 Personal history of colon polyps, unspecified: Secondary | ICD-10-CM

## 2014-04-04 DIAGNOSIS — E876 Hypokalemia: Secondary | ICD-10-CM | POA: Diagnosis not present

## 2014-04-04 DIAGNOSIS — Z8673 Personal history of transient ischemic attack (TIA), and cerebral infarction without residual deficits: Secondary | ICD-10-CM

## 2014-04-04 DIAGNOSIS — Z66 Do not resuscitate: Secondary | ICD-10-CM | POA: Diagnosis present

## 2014-04-04 DIAGNOSIS — Z9981 Dependence on supplemental oxygen: Secondary | ICD-10-CM

## 2014-04-04 DIAGNOSIS — J690 Pneumonitis due to inhalation of food and vomit: Secondary | ICD-10-CM

## 2014-04-04 DIAGNOSIS — IMO0001 Reserved for inherently not codable concepts without codable children: Secondary | ICD-10-CM

## 2014-04-04 DIAGNOSIS — I498 Other specified cardiac arrhythmias: Secondary | ICD-10-CM | POA: Diagnosis not present

## 2014-04-04 DIAGNOSIS — Z7901 Long term (current) use of anticoagulants: Secondary | ICD-10-CM

## 2014-04-04 DIAGNOSIS — I4891 Unspecified atrial fibrillation: Secondary | ICD-10-CM

## 2014-04-04 DIAGNOSIS — E871 Hypo-osmolality and hyponatremia: Secondary | ICD-10-CM | POA: Diagnosis not present

## 2014-04-04 DIAGNOSIS — J4489 Other specified chronic obstructive pulmonary disease: Secondary | ICD-10-CM

## 2014-04-04 DIAGNOSIS — A419 Sepsis, unspecified organism: Principal | ICD-10-CM

## 2014-04-04 DIAGNOSIS — I5189 Other ill-defined heart diseases: Secondary | ICD-10-CM

## 2014-04-04 DIAGNOSIS — E079 Disorder of thyroid, unspecified: Secondary | ICD-10-CM

## 2014-04-04 DIAGNOSIS — Z91040 Latex allergy status: Secondary | ICD-10-CM

## 2014-04-04 DIAGNOSIS — J9611 Chronic respiratory failure with hypoxia: Secondary | ICD-10-CM

## 2014-04-04 DIAGNOSIS — J9620 Acute and chronic respiratory failure, unspecified whether with hypoxia or hypercapnia: Secondary | ICD-10-CM

## 2014-04-04 DIAGNOSIS — F17200 Nicotine dependence, unspecified, uncomplicated: Secondary | ICD-10-CM

## 2014-04-04 DIAGNOSIS — Z801 Family history of malignant neoplasm of trachea, bronchus and lung: Secondary | ICD-10-CM

## 2014-04-04 DIAGNOSIS — E785 Hyperlipidemia, unspecified: Secondary | ICD-10-CM

## 2014-04-04 DIAGNOSIS — E0590 Thyrotoxicosis, unspecified without thyrotoxic crisis or storm: Secondary | ICD-10-CM | POA: Diagnosis present

## 2014-04-04 DIAGNOSIS — J189 Pneumonia, unspecified organism: Secondary | ICD-10-CM

## 2014-04-04 DIAGNOSIS — I69991 Dysphagia following unspecified cerebrovascular disease: Secondary | ICD-10-CM

## 2014-04-04 DIAGNOSIS — R509 Fever, unspecified: Secondary | ICD-10-CM

## 2014-04-04 DIAGNOSIS — E669 Obesity, unspecified: Secondary | ICD-10-CM

## 2014-04-04 DIAGNOSIS — J90 Pleural effusion, not elsewhere classified: Secondary | ICD-10-CM | POA: Diagnosis present

## 2014-04-04 DIAGNOSIS — M129 Arthropathy, unspecified: Secondary | ICD-10-CM | POA: Diagnosis present

## 2014-04-04 DIAGNOSIS — Z0389 Encounter for observation for other suspected diseases and conditions ruled out: Secondary | ICD-10-CM

## 2014-04-04 DIAGNOSIS — I619 Nontraumatic intracerebral hemorrhage, unspecified: Secondary | ICD-10-CM

## 2014-04-04 DIAGNOSIS — F329 Major depressive disorder, single episode, unspecified: Secondary | ICD-10-CM | POA: Diagnosis present

## 2014-04-04 DIAGNOSIS — I10 Essential (primary) hypertension: Secondary | ICD-10-CM

## 2014-04-04 DIAGNOSIS — R0989 Other specified symptoms and signs involving the circulatory and respiratory systems: Secondary | ICD-10-CM

## 2014-04-04 DIAGNOSIS — I519 Heart disease, unspecified: Secondary | ICD-10-CM | POA: Diagnosis present

## 2014-04-04 DIAGNOSIS — E43 Unspecified severe protein-calorie malnutrition: Secondary | ICD-10-CM

## 2014-04-04 DIAGNOSIS — I359 Nonrheumatic aortic valve disorder, unspecified: Secondary | ICD-10-CM

## 2014-04-04 DIAGNOSIS — I4892 Unspecified atrial flutter: Secondary | ICD-10-CM

## 2014-04-04 DIAGNOSIS — Z825 Family history of asthma and other chronic lower respiratory diseases: Secondary | ICD-10-CM

## 2014-04-04 DIAGNOSIS — Z800 Family history of malignant neoplasm of digestive organs: Secondary | ICD-10-CM

## 2014-04-04 DIAGNOSIS — R079 Chest pain, unspecified: Secondary | ICD-10-CM

## 2014-04-04 DIAGNOSIS — I6389 Other cerebral infarction: Secondary | ICD-10-CM

## 2014-04-04 DIAGNOSIS — Z888 Allergy status to other drugs, medicaments and biological substances status: Secondary | ICD-10-CM

## 2014-04-04 DIAGNOSIS — K219 Gastro-esophageal reflux disease without esophagitis: Secondary | ICD-10-CM

## 2014-04-04 DIAGNOSIS — R6520 Severe sepsis without septic shock: Secondary | ICD-10-CM

## 2014-04-04 DIAGNOSIS — R4020 Unspecified coma: Secondary | ICD-10-CM | POA: Diagnosis present

## 2014-04-04 DIAGNOSIS — Z8249 Family history of ischemic heart disease and other diseases of the circulatory system: Secondary | ICD-10-CM

## 2014-04-04 DIAGNOSIS — Y95 Nosocomial condition: Secondary | ICD-10-CM | POA: Diagnosis present

## 2014-04-04 DIAGNOSIS — F3289 Other specified depressive episodes: Secondary | ICD-10-CM | POA: Diagnosis present

## 2014-04-04 DIAGNOSIS — I6350 Cerebral infarction due to unspecified occlusion or stenosis of unspecified cerebral artery: Secondary | ICD-10-CM | POA: Diagnosis present

## 2014-04-04 DIAGNOSIS — N179 Acute kidney failure, unspecified: Secondary | ICD-10-CM | POA: Diagnosis present

## 2014-04-04 DIAGNOSIS — G459 Transient cerebral ischemic attack, unspecified: Secondary | ICD-10-CM

## 2014-04-04 DIAGNOSIS — R0602 Shortness of breath: Secondary | ICD-10-CM

## 2014-04-04 DIAGNOSIS — R002 Palpitations: Secondary | ICD-10-CM

## 2014-04-04 DIAGNOSIS — J449 Chronic obstructive pulmonary disease, unspecified: Secondary | ICD-10-CM

## 2014-04-04 DIAGNOSIS — G936 Cerebral edema: Secondary | ICD-10-CM | POA: Diagnosis present

## 2014-04-04 LAB — BLOOD GAS, ARTERIAL
Acid-Base Excess: 12.6 mmol/L — ABNORMAL HIGH (ref 0.0–2.0)
Bicarbonate: 37.4 mEq/L — ABNORMAL HIGH (ref 20.0–24.0)
DRAWN BY: 28340
O2 Content: 2 L/min
O2 Saturation: 94.3 %
PCO2 ART: 62.8 mmHg — AB (ref 35.0–45.0)
Patient temperature: 103
TCO2: 39.1 mmol/L (ref 0–100)
pH, Arterial: 7.405 (ref 7.350–7.450)
pO2, Arterial: 82.7 mmHg (ref 80.0–100.0)

## 2014-04-04 LAB — COMPREHENSIVE METABOLIC PANEL
ALT: 10 U/L (ref 0–35)
AST: 28 U/L (ref 0–37)
Albumin: 2.5 g/dL — ABNORMAL LOW (ref 3.5–5.2)
Alkaline Phosphatase: 58 U/L (ref 39–117)
BUN: 17 mg/dL (ref 6–23)
CALCIUM: 9.5 mg/dL (ref 8.4–10.5)
CO2: 33 meq/L — AB (ref 19–32)
Chloride: 91 mEq/L — ABNORMAL LOW (ref 96–112)
Creatinine, Ser: 0.53 mg/dL (ref 0.50–1.10)
GFR calc Af Amer: >90 mL/min (ref >90)
GLUCOSE: 105 mg/dL — AB (ref 70–99)
Potassium: 5.2 mEq/L (ref 3.7–5.3)
SODIUM: 138 meq/L (ref 137–147)
Total Bilirubin: 0.4 mg/dL (ref 0.3–1.2)
Total Protein: 7 g/dL (ref 6.0–8.3)

## 2014-04-04 LAB — CBC
HCT: 41.1 % (ref 36.0–46.0)
Hemoglobin: 12.3 g/dL (ref 12.0–15.0)
MCH: 23.5 pg — ABNORMAL LOW (ref 26.0–34.0)
MCHC: 29.9 g/dL — ABNORMAL LOW (ref 30.0–36.0)
MCV: 78.4 fL (ref 78.0–100.0)
PLATELETS: 291 10*3/uL (ref 150–400)
RBC: 5.24 MIL/uL — AB (ref 3.87–5.11)
RDW: 21.6 % — AB (ref 11.5–15.5)
WBC: 14.3 10*3/uL — AB (ref 4.0–10.5)

## 2014-04-04 LAB — URINALYSIS, ROUTINE W REFLEX MICROSCOPIC
Bilirubin Urine: NEGATIVE
Bilirubin Urine: NEGATIVE
GLUCOSE, UA: NEGATIVE mg/dL
Glucose, UA: NEGATIVE mg/dL
Hgb urine dipstick: NEGATIVE
Ketones, ur: NEGATIVE mg/dL
Ketones, ur: NEGATIVE mg/dL
LEUKOCYTES UA: NEGATIVE
NITRITE: NEGATIVE
Nitrite: NEGATIVE
PH: 8.5 — AB (ref 5.0–8.0)
PH: 7 (ref 5.0–8.0)
PROTEIN: 30 mg/dL — AB
Protein, ur: 100 mg/dL — AB
Specific Gravity, Urine: 1.02 (ref 1.005–1.030)
Specific Gravity, Urine: 1.02 (ref 1.005–1.030)
Urobilinogen, UA: 1 mg/dL (ref 0.0–1.0)
Urobilinogen, UA: 2 mg/dL — ABNORMAL HIGH (ref 0.0–1.0)

## 2014-04-04 LAB — URINE MICROSCOPIC-ADD ON

## 2014-04-04 LAB — DIGOXIN LEVEL: Digoxin Level: 0.8 ng/mL (ref 0.8–2.0)

## 2014-04-04 LAB — GLUCOSE, CAPILLARY
GLUCOSE-CAPILLARY: 105 mg/dL — AB (ref 70–99)
GLUCOSE-CAPILLARY: 112 mg/dL — AB (ref 70–99)
GLUCOSE-CAPILLARY: 122 mg/dL — AB (ref 70–99)
GLUCOSE-CAPILLARY: 128 mg/dL — AB (ref 70–99)
Glucose-Capillary: 102 mg/dL — ABNORMAL HIGH (ref 70–99)
Glucose-Capillary: 102 mg/dL — ABNORMAL HIGH (ref 70–99)
Glucose-Capillary: 92 mg/dL (ref 70–99)

## 2014-04-04 LAB — POCT I-STAT 3, ART BLOOD GAS (G3+)
ACID-BASE EXCESS: 13 mmol/L — AB (ref 0.0–2.0)
BICARBONATE: 39.9 meq/L — AB (ref 20.0–24.0)
O2 Saturation: 100 %
Patient temperature: 99
TCO2: 42 mmol/L (ref 0–100)
pCO2 arterial: 56.8 mmHg — ABNORMAL HIGH (ref 35.0–45.0)
pH, Arterial: 7.456 — ABNORMAL HIGH (ref 7.350–7.450)
pO2, Arterial: 382 mmHg — ABNORMAL HIGH (ref 80.0–100.0)

## 2014-04-04 LAB — STREP PNEUMONIAE URINARY ANTIGEN: STREP PNEUMO URINARY ANTIGEN: NEGATIVE

## 2014-04-04 MED ORDER — VANCOMYCIN HCL 500 MG IV SOLR
500.0000 mg | Freq: Two times a day (BID) | INTRAVENOUS | Status: DC
  Administered 2014-04-04 – 2014-04-06 (×4): 500 mg via INTRAVENOUS
  Filled 2014-04-04 (×5): qty 500

## 2014-04-04 MED ORDER — LABETALOL HCL 5 MG/ML IV SOLN
1.0000 mg/min | INTRAVENOUS | Status: DC
  Filled 2014-04-04: qty 100

## 2014-04-04 MED ORDER — MIDAZOLAM HCL 2 MG/2ML IJ SOLN
1.0000 mg | INTRAMUSCULAR | Status: DC | PRN
  Administered 2014-04-04: 2 mg via INTRAVENOUS

## 2014-04-04 MED ORDER — MIDAZOLAM HCL 2 MG/2ML IJ SOLN
INTRAMUSCULAR | Status: AC
  Administered 2014-04-04: 2 mg via INTRAVENOUS
  Filled 2014-04-04: qty 4

## 2014-04-04 MED ORDER — FENTANYL CITRATE 0.05 MG/ML IJ SOLN
25.0000 ug | INTRAMUSCULAR | Status: DC | PRN
  Administered 2014-04-04 – 2014-04-10 (×4): 100 ug via INTRAVENOUS
  Administered 2014-04-10 – 2014-04-11 (×2): 50 ug via INTRAVENOUS
  Administered 2014-04-11: 100 ug via INTRAVENOUS
  Filled 2014-04-04 (×7): qty 2

## 2014-04-04 MED ORDER — METHIMAZOLE 5 MG PO TABS
5.0000 mg | ORAL_TABLET | Freq: Every day | ORAL | Status: DC
  Administered 2014-04-04 – 2014-04-14 (×10): 5 mg via ORAL
  Filled 2014-04-04 (×11): qty 1

## 2014-04-04 MED ORDER — ETOMIDATE 2 MG/ML IV SOLN
10.0000 mg | Freq: Once | INTRAVENOUS | Status: AC
  Administered 2014-04-04: 10 mg via INTRAVENOUS

## 2014-04-04 MED ORDER — ROCURONIUM BROMIDE 50 MG/5ML IV SOLN
50.0000 mg | Freq: Once | INTRAVENOUS | Status: AC
  Administered 2014-04-04: 50 mg via INTRAVENOUS
  Filled 2014-04-04: qty 5

## 2014-04-04 MED ORDER — CHLORHEXIDINE GLUCONATE 0.12 % MT SOLN
15.0000 mL | Freq: Two times a day (BID) | OROMUCOSAL | Status: DC
  Administered 2014-04-04 – 2014-04-14 (×21): 15 mL via OROMUCOSAL
  Filled 2014-04-04 (×25): qty 15

## 2014-04-04 MED ORDER — MIDAZOLAM HCL 2 MG/2ML IJ SOLN
2.0000 mg | Freq: Once | INTRAMUSCULAR | Status: AC
  Administered 2014-04-04: 2 mg via INTRAVENOUS

## 2014-04-04 MED ORDER — METOPROLOL TARTRATE 100 MG PO TABS
100.0000 mg | ORAL_TABLET | Freq: Three times a day (TID) | ORAL | Status: DC
  Administered 2014-04-04: 100 mg via ORAL
  Filled 2014-04-04 (×3): qty 1

## 2014-04-04 MED ORDER — VANCOMYCIN HCL 500 MG IV SOLR
500.0000 mg | Freq: Two times a day (BID) | INTRAVENOUS | Status: DC
  Filled 2014-04-04 (×2): qty 500

## 2014-04-04 MED ORDER — DEXTROSE 5 % IV SOLN
1.0000 g | Freq: Three times a day (TID) | INTRAVENOUS | Status: DC
  Administered 2014-04-04 – 2014-04-06 (×6): 1 g via INTRAVENOUS
  Filled 2014-04-04 (×8): qty 1

## 2014-04-04 MED ORDER — INSULIN ASPART 100 UNIT/ML ~~LOC~~ SOLN
0.0000 [IU] | SUBCUTANEOUS | Status: DC
  Administered 2014-04-05 – 2014-04-07 (×3): 1 [IU] via SUBCUTANEOUS
  Administered 2014-04-07 (×2): 2 [IU] via SUBCUTANEOUS
  Administered 2014-04-07 – 2014-04-08 (×2): 1 [IU] via SUBCUTANEOUS
  Administered 2014-04-08 (×2): 2 [IU] via SUBCUTANEOUS
  Administered 2014-04-08 (×2): 1 [IU] via SUBCUTANEOUS
  Administered 2014-04-09: 2 [IU] via SUBCUTANEOUS
  Administered 2014-04-09 (×4): 1 [IU] via SUBCUTANEOUS
  Administered 2014-04-10 (×4): 2 [IU] via SUBCUTANEOUS
  Administered 2014-04-11 – 2014-04-13 (×4): 1 [IU] via SUBCUTANEOUS
  Administered 2014-04-13 (×2): 2 [IU] via SUBCUTANEOUS
  Administered 2014-04-13: 1 [IU] via SUBCUTANEOUS
  Administered 2014-04-13: 2 [IU] via SUBCUTANEOUS
  Administered 2014-04-13: 1 [IU] via SUBCUTANEOUS
  Administered 2014-04-14: 2 [IU] via SUBCUTANEOUS
  Administered 2014-04-14: 1 [IU] via SUBCUTANEOUS
  Administered 2014-04-14: 2 [IU] via SUBCUTANEOUS
  Administered 2014-04-14: 1 [IU] via SUBCUTANEOUS

## 2014-04-04 MED ORDER — FENTANYL CITRATE 0.05 MG/ML IJ SOLN
INTRAMUSCULAR | Status: AC
  Administered 2014-04-04: 100 ug via INTRAVENOUS
  Filled 2014-04-04: qty 2

## 2014-04-04 MED ORDER — DILTIAZEM 12 MG/ML ORAL SUSPENSION
90.0000 mg | Freq: Three times a day (TID) | ORAL | Status: DC
  Administered 2014-04-04 – 2014-04-06 (×9): 90 mg via ORAL
  Filled 2014-04-04 (×14): qty 9

## 2014-04-04 MED ORDER — METOPROLOL TARTRATE 50 MG PO TABS
75.0000 mg | ORAL_TABLET | Freq: Two times a day (BID) | ORAL | Status: DC
  Administered 2014-04-04 – 2014-04-05 (×3): 75 mg via ORAL
  Filled 2014-04-04 (×5): qty 1

## 2014-04-04 MED ORDER — VANCOMYCIN HCL 500 MG IV SOLR
500.0000 mg | Freq: Two times a day (BID) | INTRAVENOUS | Status: DC
  Filled 2014-04-04: qty 500

## 2014-04-04 MED ORDER — BIOTENE DRY MOUTH MT LIQD
15.0000 mL | Freq: Two times a day (BID) | OROMUCOSAL | Status: DC
  Administered 2014-04-04 – 2014-04-14 (×20): 15 mL via OROMUCOSAL

## 2014-04-04 MED ORDER — FAMOTIDINE IN NACL 20-0.9 MG/50ML-% IV SOLN
20.0000 mg | Freq: Two times a day (BID) | INTRAVENOUS | Status: DC
  Administered 2014-04-04 – 2014-04-08 (×9): 20 mg via INTRAVENOUS
  Filled 2014-04-04 (×11): qty 50

## 2014-04-04 MED ORDER — FENTANYL CITRATE 0.05 MG/ML IJ SOLN
100.0000 ug | Freq: Once | INTRAMUSCULAR | Status: AC
  Administered 2014-04-04: 100 ug via INTRAVENOUS

## 2014-04-04 MED ORDER — DIGOXIN 125 MCG PO TABS
0.1250 mg | ORAL_TABLET | Freq: Every day | ORAL | Status: DC
  Administered 2014-04-04 – 2014-04-14 (×10): 0.125 mg via ORAL
  Filled 2014-04-04 (×11): qty 1

## 2014-04-04 NOTE — Progress Notes (Signed)
Patient transferred to and from CT without event. Vital signs stable throughout. No complications. RT will continue to monitor.

## 2014-04-04 NOTE — H&P (Signed)
Patient's PCP: [name], NP  Chief Complaint: Fever and shortness of breath  History of Present Illness: Colleen [name] is a 63 y.o. 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  She presented to [facility] on 03/18/2014 with severe headache, nausea, vomiting, and left-sided weakness.  Patient is unable to provide any history, most of the history was obtained from chart review.  CT showed hemorrhagic conversion of right temporal occipital infarct with intra-ventricular extension into the right lateral ventricle with cerebral edema and left midline shift.  Patient was admitted to ICU and had extensive workup done.  Patient was evaluated by neurology service and she was continued on valproate 500 mg every 8 hours.  Patient's hospital course was complicated by HCAP for which she was continued on empiric Zosyn.  Patient was placed on a feeding tube for nutrition on 03/28/2012.  She was subsequently discharged to inpatient rehabilitation.  She was continued on Zosyn.  During the course of the hospital stay, patient had low-grade fevers yesterday and she had a fever of 103 and subsequently became tachypnea.  Patient was aggressively suctioned with copious amount of secretions.  Her respiratory status improved.  Hospitalist service was asked to admit the patient for further care and management.  Review of Systems: All systems reviewed with the patient and positive as per history of present illness, otherwise all other systems are negative.  Past Medical History  Diagnosis Date  . Hypertension   . Hiatal hernia   . Adenomatous polyp of colon   . Acute asthmatic bronchitis   . GERD (gastroesophageal reflux disease)     EGD neg 03/07/2008, but  prev required dilation  . COPD (chronic obstructive pulmonary disease)   . IBS (irritable bowel syndrome)     chronic  . Urinary incontinence   . Esophagitis   . Chronic gastritis   . Duodenitis without hemorrhage 10/01/2001  . Diverticulosis   . Personal history of colonic adenomas 02/05/2008        . Anemia   . Hyperlipidemia   . Decreased peripheral vision of right eye   . Pneumonia 2011  . Hyperthyroidism   . Type II diabetes mellitus   . Stroke     a. Hx of stroke and ministrokes ~ 2009. b. TIA 11/2013, dx with AF at that time.  . Arthritis   . Depression   . On home oxygen therapy     "2L; suppose to use 24/7" (01/05/2014)  . PAF (paroxysmal atrial fibrillation)     a. Prior hx. b. Recurred 11/2013 in setting of possible TIA. Lifelong Coumadin planned. Spont converted to NSR. EF 60-65% by echo 11/2013, normal cors 2007, normal nuc 2011.   . Atrial flutter     a. Dx 12/2013. s/p DCCV. On anticoagulation.   Past Surgical History  Procedure Laterality Date  . Colonoscopy    . Upper gi endoscopy    . Cholecystectomy  1980's  . Vaginal hysterectomy  1992  . Breast biopsy Left 1987    "milk gland"  . Cardioversion N/A 01/06/2014    Procedure: CARDIOVERSION;  Surgeon: Lelon Perla, MD;  Location: [facility] OR;  Service: Cardiovascular;  Laterality: N/A;   Family History  Problem Relation Age of Onset  . Heart disease Mother   . Asthma  Father   . Lung cancer Father   . Asthma Sister   . Heart disease Sister   . Colon cancer Father    History   Social History  . Marital Status: Married    Spouse Name: N/A    Number of Children: N/A  . Years of Education: N/A   Occupational History  . Not on file.   Social History Main Topics  . Smoking status: Current Every Day Smoker -- 1.00 packs/day for 51 years    Types: Cigarettes  . Smokeless tobacco: Never Used  . Alcohol Use: No  . Drug Use: No  . Sexual Activity: No   Other Topics Concern  . Not on file   Social  History Narrative  . No narrative on file   Allergies: Crestor; Latex; and Wellbutrin  Home Meds: Prior to Admission medications   Medication Sig Start Date End Date Taking? Authorizing Provider  ALPRAZolam Prudy Feeler) 0.5 MG tablet Take 0.5 mg by mouth at bedtime.     Historical Provider, MD  atorvastatin (LIPITOR) 20 MG tablet Take 1 tablet (20 mg total) by mouth every evening. 01/06/14   Dayna N Dunn, PA-C  cetirizine (ZYRTEC) 10 MG tablet Take 10 mg by mouth at bedtime.     Historical Provider, MD  digoxin (LANOXIN) 0.25 MG tablet Take 0.5 tablets (0.125 mg total) by mouth daily. 03/03/14   Lewayne Bunting, MD  diltiazem (CARDIZEM CD) 360 MG 24 hr capsule Take 1 capsule (360 mg total) by mouth daily. 02/19/14   Lonia Blood, MD  DULoxetine (CYMBALTA) 60 MG capsule Take 120 mg by mouth daily.    Historical Provider, MD  ferrous sulfate 325 (65 FE) MG tablet Take 325 mg by mouth 2 (two) times daily with a meal.    Historical Provider, MD  Fluticasone-Salmeterol (ADVAIR DISKUS) 250-50 MCG/DOSE AEPB Inhale 1 puff into the lungs 2 (two) times daily.     Historical Provider, MD  furosemide (LASIX) 40 MG tablet Take 40 mg by mouth daily.     Historical Provider, MD  HYDROcodone-acetaminophen (NORCO) 7.5-325 MG per tablet Take 1 tablet by mouth every 6 (six) hours as needed for moderate pain.    Historical Provider, MD  levalbuterol [facility] HFA) 45 MCG/ACT inhaler Inhale 2 puffs into the lungs every 4 (four) hours as needed for wheezing or shortness of breath.    Historical Provider, MD  levalbuterol Pauline Aus) 0.63 MG/3ML nebulizer solution Take 3 mLs (0.63 mg total) by nebulization every 3 (three) hours as needed for wheezing or shortness of breath. 02/19/14   Lonia Blood, MD  metFORMIN (GLUMETZA) 1000 MG (MOD) 24 hr tablet Take 1,000 mg by mouth 2 (two) times daily with a meal.    Historical Provider, MD  methimazole (TAPAZOLE) 5 MG tablet Take 5 mg by mouth daily.    Historical Provider, MD   metoprolol tartrate (LOPRESSOR) 25 MG tablet Take 3 tablets (75 mg total) by mouth 2 (two) times daily. 02/19/14   Lonia Blood, MD  omeprazole (PRILOSEC) 40 MG capsule Take 40 mg by mouth 2 (two) times daily.     Historical Provider, MD  solifenacin (VESICARE) 10 MG tablet Take 10 mg by mouth daily.    Historical Provider, MD  tiotropium (SPIRIVA) 18 MCG inhalation capsule Place 18 mcg into inhaler and inhale daily.     Historical Provider, MD  trimethoprim (TRIMPEX) 100 MG tablet Take 100 mg by mouth at bedtime.    Historical Provider, MD  Physical Exam: There were no vitals taken for this visit. General: Somnolent, not Oriented x3, mild respiratory distress. HEENT: Moist mucous membranes, nasal cannula in place Neck: Supple CV: S1 and S2 Lungs: Coarse breath sounds bilaterally, no wheezing. Abdomen: Soft, Nontender, Nondistended, +bowel sounds. Ext: Good pulses. Trace edema. No clubbing or cyanosis noted. Neuro: Cannot be assessed due to patient's compliance.  Lab results: No results found for this basename: NA, K, CL, CO2, GLUCOSE, BUN, CREATININE, CALCIUM, MG, PHOS,  in the last 72 hours No results found for this basename: AST, ALT, ALKPHOS, BILITOT, PROT, ALBUMIN,  in the last 72 hours No results found for this basename: LIPASE, AMYLASE,  in the last 72 hours No results found for this basename: WBC, NEUTROABS, HGB, HCT, MCV, PLT,  in the last 72 hours No results found for this basename: CKTOTAL, CKMB, CKMBINDEX, TROPONINI,  in the last 72 hours No components found with this basename: POCBNP,  No results found for this basename: DDIMER,  in the last 72 hours No results found for this basename: HGBA1C,  in the last 72 hours No results found for this basename: CHOL, HDL, LDLCALC, TRIG, CHOLHDL, LDLDIRECT,  in the last 72 hours No results found for this basename: TSH, T4TOTAL, FREET3, T3FREE, THYROIDAB,  in the last 72 hours No results found for this basename: VITAMINB12,  FOLATE, FERRITIN, TIBC, IRON, RETICCTPCT,  in the last 72 hours Imaging results:  Dg Chest 2 View  04/03/2014   CLINICAL DATA:  Unresponsive  EXAM: CHEST  2 VIEW  COMPARISON:  03/30/2014  FINDINGS: Cardiomegaly again noted. There is moderate size right pleural effusion with right basilar atelectasis or infiltrate. Left lung is clear. No pulmonary edema. NG tube in place.  IMPRESSION: Moderate size right pleural effusion with right basilar atelectasis or infiltrate. No pulmonary edema.   Electronically Signed   By: Lahoma Crocker M.D.   On: 04/03/2014 14:40   Dg Chest 2 View  03/30/2014   CLINICAL DATA:  63 year old female with cough and leukocytosis. Initial encounter.  EXAM: CHEST  2 VIEW  COMPARISON:  03/25/2014 and earlier.  FINDINGS: Moderate bilateral pleural effusions. Pleural fluid tracking in the fissures. Superimposed more confluent airspace opacity in the lower lobes, but appears platelike on the lateral view. Stable cardiac size and mediastinal contours. Visualized tracheal air column is within normal limits. No pneumothorax. No overt pulmonary edema. No acute osseous abnormality identified.  IMPRESSION: Primary abnormality is moderate bilateral pleural effusions. Superimposed lower lobe opacity more resembles atelectasis than pneumonia at this time.   Electronically Signed   By: Lars Pinks M.D.   On: 03/30/2014 12:28   Dg Abd 1 View  03/31/2014   CLINICAL DATA:  Advancement of nasogastric tube.  EXAM: ABDOMEN - 1 VIEW  COMPARISON:  03/31/2014 5:55 a.m.  FINDINGS: Single C-arm views submitted for review. This reveals that the nasogastric tube has been advanced. This is curled upon itself with the tip at the level of the gastric fundus and the side hole at the level of the gastric body-fundus junction.  IMPRESSION: Nasogastric tube has been advanced. This is curled upon itself with the tip at the level of the gastric fundus and the side hole at the level of the gastric body-fundus junction.    Electronically Signed   By: Chauncey Cruel M.D.   On: 03/31/2014 10:13   Dg Abd 1 View  03/31/2014   CLINICAL DATA:  Nasogastric tube placement.  EXAM: ABDOMEN - 1 VIEW  COMPARISON:  Abdominal radiograph March 31, 2014 at 2:39 a.m.  FINDINGS: Nasogastric tube tip projects in proximal stomach with side port above the gastroesophageal junction. Included bowel gas pattern is nondilated and nonobstructive. Residual contrast in the large bowel. Surgical clips in the right abdomen may reflect cholecystectomy. Punctate mid abdominal calcifications may be pancreatic. Small right pleural effusion. Left lung base atelectasis.  IMPRESSION: Nasogastric tube tip projects in proximal stomach with side port above the gastroesophageal junction. Recommend advancement.   Electronically Signed   By: Elon Alas   On: 03/31/2014 06:22   Dg Abd 1 View  03/31/2014   CLINICAL DATA:  Nasogastric tube placement.  EXAM: ABDOMEN - 1 VIEW  COMPARISON:  Donald radiograph performed 03/30/2014  FINDINGS: The nasogastric tube has been retracted, ending about the gastroesophageal junction, with the sideport at the distal esophagus. This should be advanced at least 8 cm.  A small amount of air is seen within the stomach. The visualized bowel gas pattern is unremarkable, with residual contrast again seen in the colon. Clips are noted within the right upper quadrant, reflecting prior cholecystectomy.  No acute osseous abnormalities are seen. The visualized lung bases are grossly clear.  IMPRESSION: Nasogastric tube has been retracted, seen ending about the gastroesophageal junction, with the sideport at the distal esophagus. This should be advanced at least 8 cm.   Electronically Signed   By: Garald Balding M.D.   On: 03/31/2014 03:14   Dg Abd 1 View  03/30/2014   CLINICAL DATA:  Check nasogastric catheter placement  EXAM: ABDOMEN - 1 VIEW  COMPARISON:  03/24/2014  FINDINGS: The previously seen feeding tube is been removed and a nasogastric  catheter placed. It lies just beyond the gastroesophageal junction within the stomach. Contrast material is noted throughout the colon. No free air is seen. Postsurgical changes are noted. No acute bony abnormality is noted.  IMPRESSION: Nasogastric catheter within the stomach as described.   Electronically Signed   By: Inez Catalina M.D.   On: 03/30/2014 16:57   Ct Head Wo Contrast  04/01/2014   CLINICAL DATA:  Lethargy, CVA. Evaluate for increasing cerebral edema.  EXAM: CT HEAD WITHOUT CONTRAST  TECHNIQUE: Contiguous axial images were obtained from the base of the skull through the vertex without intravenous contrast.  COMPARISON:  CT HEAD W/O CM dated 03/27/2014  FINDINGS: A 3.0 x 6.5 cm hematoma is again seen in the right temporal and occipital lobes, stable. Surrounding vasogenic edema appears minimally increased. Right to left midline shift measures 6 mm, minimally increased from the prior exam. Periventricular low attenuation is again seen. No mass lesion. Lateral ventricular size is stable and there is a trace amount of hemorrhage seen dependently in the left lateral ventricle, slightly less than on the prior study. Sulcal effacement is again seen in the right cerebral hemisphere.  Small right mastoid effusion is stable. No air-fluid levels in the paranasal sinuses. Mucosal thickening in the maxillary sinuses.  IMPRESSION: 1. Stable hematoma in the right temporal and occipital lobes with slight increase in surrounding vasogenic edema and subfalcine herniation. 2. Stable lateral ventricular size with trace hemorrhage in the left lateral ventricle, decreased from the prior exam. 3. Periventricular low attenuation is indicative of chronic microvascular white matter ischemic changes. 4. Small right mastoid effusion, stable.   Electronically Signed   By: Lorin Picket M.D.   On: 04/01/2014 13:34   Ct Head Wo Contrast  03/27/2014   CLINICAL DATA:  Evaluate intracranial hemorrhage. Patient is not  following  commands well.  EXAM: CT HEAD WITHOUT CONTRAST  TECHNIQUE: Contiguous axial images were obtained from the base of the skull through the vertex without contrast.  COMPARISON:  03/22/2014  FINDINGS: The intraparenchymal hemorrhage involving the right occipital and temporal lobes has slightly decreased in size. This hematoma measures 6.3 x 3.0 cm on sequence 2, image 14 and previously measured 6.6 x 3.4 cm. Again noted is vasogenic edema around the hematoma. The vasogenic edema in the right parietal lobe is more conspicuous and cannot exclude mild progression in this area. Again noted is blood within the right lateral ventricle but there are decreased blood products in the right anterior horn. There continues to be 3 mm of right to left midline shift. Stable sulci effacement in the right cerebral cortex. Left lateral ventricle size is unchanged. Again noted is low-density in the left occipital and parietal white matter.  Extensive mucosal disease in the maxillary sinuses bilaterally. Patient has a nasal tube. No acute bone abnormality.  IMPRESSION: The right intraparenchymal hematoma has slightly decreased in size. There continues to be vasogenic edema and no change in the midline shift. The vasogenic edema extending into the right parietal lobe is slightly more conspicuous and cannot exclude mild progression in this area.  No evidence for a new hemorrhage.   Electronically Signed   By: Markus Daft M.D.   On: 03/27/2014 12:34   Ct Head Wo Contrast  03/22/2014   CLINICAL DATA:  Follow up stroke.  Confusion.  EXAM: CT HEAD WITHOUT CONTRAST  TECHNIQUE: Contiguous axial images were obtained from the base of the skull through the vertex without intravenous contrast.  COMPARISON:  CT HEAD W/O CM dated 03/20/2014  FINDINGS: Right temporal occipital 3.4 x 6.7 cm (transverse by AP) intraparenchymal hematoma is similar in size, morphology imaging characteristics. Surrounding low-density vasogenic edema is similar. Mild local  mass effect with sulcal effacement. Similar intraventricular extension with degenerating intraventricular blood products. Similar mild prominence of left lateral ventricle, with partial effacement of right lateral ventricle. 3 mm of right to left subfalcine herniation is relatively unchanged.  No acute large vascular territory infarct. No abnormal extra-axial fluid collections. Basal cisterns are patent though, there is mild right uncal herniation, similar. Mild calcific atherosclerosis. Nasogastric tube via right nares.  Similar paranasal sinus mucosal thickening with left maxillary sinus air-fluid level. Subcentimeter right frontal sinus osteoma. Trace soft tissue density in right mastoid tip. No skull fracture. Ocular globes and orbital contents are nonsuspicious.  IMPRESSION: Similar appearance of right temporal parietal 6.5 x 3.4 cm intraparenchymal hemorrhage with intraventricular extension, similar right left midline shift. No rebleed. No hydrocephalus.   Electronically Signed   By: Elon Alas   On: 03/22/2014 04:22   Ct Head Wo Contrast  03/20/2014   CLINICAL DATA:  Follow-up hemorrhagic CVA  EXAM: CT HEAD WITHOUT CONTRAST  TECHNIQUE: Contiguous axial images were obtained from the base of the skull through the vertex without intravenous contrast.  COMPARISON:  03/19/2014 at 0426 hr  FINDINGS: 6.5 x 3.4 cm parenchymal hematoma in the right temporo-occipital region (series 2/ image 15), previously 6.4 x 3.7 cm, unchanged. Surrounding mild vasogenic edema with stable intraventricular extension into the right lateral ventricle.  Partial effacement of the right lateral ventricle with 4 mm leftward midline shift (series 2/image 18), grossly unchanged. Crowding of the right perimesencephalic cistern. Basal cisterns remain patent.  Diffuse cortical effacement/loss of sulcation in the right cerebral hemisphere (series 2/image 23), indicating cerebral edema (series 2/image 21),  grossly unchanged.  No  evidence of extra-axial fluid collection.  Partial opacification of the bilateral maxillary sinuses. Partial opacification of the right mastoid air cells.  No evidence of calvarial fracture.  IMPRESSION: Stable parenchymal hematoma in the right temporo-occipital region, as described above.  Mild surrounding vasogenic edema, extension into the left lateral ventricle, and 4 mm leftward midline shift, grossly unchanged.  Stable cortical effacement/loss of sulcation involving the right cerebral hemisphere, indicating cerebral edema.   Electronically Signed   By: Julian Hy M.D.   On: 03/20/2014 09:49   Ct Head Wo Contrast  03/19/2014   CLINICAL DATA:  Intra cerebral hemorrhage  EXAM: CT HEAD WITHOUT CONTRAST  TECHNIQUE: Contiguous axial images were obtained from the base of the skull through the vertex without intravenous contrast.  COMPARISON:  Prior CT from 03/18/2014.  FINDINGS: Intraparenchymal hematoma involving the right temporal occipital region is overall not significantly changed measuring 3.7 x 6.4 cm, previously 3.7 x 6.4 cm on 03/18/2014. Intraventricular extension into the right lateral ventricle is similar as well. There is partial effacement of the right lateral ventricle with persistent 5 mm right-to-left midline shift. Asymmetric enlargement of the temporal horns of the lateral ventricles is similar to prior, right greater than left. No hydrocephalus. Crowding of the right perimesencephalic cistern is stable. Basilar cisterns overall remain patent. Diffuse cortical swelling with loss of sulcation again seen within the right cerebral hemisphere, compatible with generalized edema, not significantly changed.  No new intracranial hemorrhage. No new large vessel territory infarct. No extra-axial fluid collection. No mass lesion.  Calvarium remains intact. Orbits are within normal limits. Visualized scalp soft tissues unremarkable.  Paranasal sinus disease involving the maxillary sinuses is similar  to prior. Small right mastoid effusion noted.  IMPRESSION: 1. No significant interval change in intra-axial hematoma involving the right temporal-occipital region with intraventricular extension into the right lateral ventricle. The intraparenchymal hematoma measures 6.4 x 3.7 cm on today's exam, previously 6.4 x 3.7 cm on 03/18/2014. 2. Similar appearance of diffuse edema involving the right cerebral hemisphere. Similar leftward midline shift of 5 mm. 3. Similar enlargement of the temporal horns of the lateral ventricles bilaterally. No hydrocephalus. Basilar cisterns remain patent. 4. No new intracranial hemorrhage or infarct identified.   Electronically Signed   By: Jeannine Boga M.D.   On: 03/19/2014 06:40   Ct Head (brain) Wo Contrast  03/18/2014   CLINICAL DATA:  63 year old female with left side deficit. Code stroke. Initial encounter.  EXAM: CT HEAD WITHOUT CONTRAST  TECHNIQUE: Contiguous axial images were obtained from the base of the skull through the vertex without intravenous contrast.  COMPARISON:  [facility] brain MRI 03/16/2014.  FINDINGS: At the site of the recently seen right occipital lobe white matter infarct there is now all large intra-axial hemorrhage with secondary extension into the right lateral ventricle. The parenchymal component of hemorrhage encompasses about 64 x 37 mm (estimated volume 53 mL).  Motion artifact. Leftward midline shift of approximately 5 mm. Mild enlargement of the temporal horns compared to the recent MRI.  Loss of sulci in the right hemisphere consistent with more generalized cerebral edema. Basilar cisterns remain patent. Stable gray-white matter differentiation elsewhere.  No acute osseous abnormality identified. Visualized orbit soft tissues are within normal limits. Visualized scalp soft tissues are within normal limits. Continued in mildly progressed paranasal sinus inflammatory changes.  IMPRESSION: 1. Malignant hemorrhagic transformation of the  right occipital lobe infarct first identified by MRI on 03/16/2014. Intra-axial hematoma with estimated  volume of 53 mm, plus secondary extension of hemorrhage into the right lateral ventricle. 2. Generalized right hemisphere edema. Leftward midline shift of 5 mm. 3. Mild enlargement of the temporal horns. Salient critical findings on this exam reviewed in person with Dr. Mike Craze on 03/18/2014 at 11:08AM .   Electronically Signed   By: Lars Pinks M.D.   On: 03/18/2014 11:15   Dg Chest Port 1 View  04/04/2014   CLINICAL DATA:  Respiratory distress.  EXAM: PORTABLE CHEST - 1 VIEW  COMPARISON:  Chest radiograph performed 04/03/2014  FINDINGS: The patient's enteric tube is noted extending below the diaphragm, with the side port at the gastroesophageal junction.  There is an increasing moderate right-sided pleural effusion, with associated airspace opacity. The left lung appears clear. No pneumothorax is seen.  The cardiomediastinal silhouette is normal in size. No acute osseous abnormalities are identified.  IMPRESSION: Apparent increasing moderate right-sided pleural effusion, with associated airspace opacity.   Electronically Signed   By: Garald Balding M.D.   On: 04/04/2014 05:49   Dg Chest Port 1 View  03/25/2014   CLINICAL DATA:  Follow-up infiltrate  EXAM: PORTABLE CHEST - 1 VIEW  COMPARISON:  03/24/2014  FINDINGS: NG tube is unchanged in position. There is mild interstitial prominence bilaterally. Mild interstitial edema cannot be excluded. Persistent small right pleural effusion with right basilar atelectasis or infiltrate.  IMPRESSION: Mild interstitial prominence bilaterally. Mild interstitial edema cannot be excluded. Persistent small right pleural effusion with right basilar atelectasis or infiltrate.   Electronically Signed   By: Lahoma Crocker M.D.   On: 03/25/2014 10:34   Dg Chest Port 1 View  03/24/2014   CLINICAL DATA:  SOB/ wheeze  EXAM: PORTABLE CHEST - 1 VIEW  COMPARISON:  DG CHEST 1V PORT  dated 03/18/2014  FINDINGS: The cardiac silhouette is mild-to-moderately enlarged. Low lung volumes. Diffuse increased density projects within the right lung base. Increased density projects in the left retrocardiac region. Atherosclerotic calcifications projecting in the aortic arch. Osseous structures unremarkable. Feeding tube identified with tip in the fundal region of the stomach.  IMPRESSION: Basilar infiltrates versus atelectasis. Further evaluation with PA and lateral chest radiograph recommended all.  Feeding tube appreciated tip projecting in the fundal region of the stomach.   Electronically Signed   By: Margaree Mackintosh M.D.   On: 03/24/2014 08:11   Dg Chest Port 1 View  03/18/2014   CLINICAL DATA:  Altered mental status  EXAM: PORTABLE CHEST - 1 VIEW  COMPARISON:  February 14, 2014  FINDINGS: Lungs are clear. Heart is upper normal in size with normal pulmonary vascularity. No adenopathy. No pneumothorax. No bone lesions.  IMPRESSION: No edema or consolidation.   Electronically Signed   By: Lowella Grip M.D.   On: 03/18/2014 11:25   Dg Abd Portable 1v  03/24/2014   CLINICAL DATA:  Assess for feeding tube placement  EXAM: PORTABLE ABDOMEN - 1 VIEW  COMPARISON:  March 20, 2014  FINDINGS: A feeding tube is identified with distal tip in the proximal stomach. Residual contrast is identified within the colon. Prior cholecystectomy clips are noted.  IMPRESSION: Feeding tube identified distal tip in the proximal stomach.   Electronically Signed   By: Abelardo Diesel M.D.   On: 03/24/2014 13:35   Dg Abd Portable 1v  03/20/2014   CLINICAL DATA:  Feeding tube placement.  EXAM: PORTABLE ABDOMEN - 1 VIEW  COMPARISON:  DG ABD PORTABLE 1V dated 03/20/2014  FINDINGS: Weighted enteric tube  has been advanced, with tip now overlying the expected region of the distal stomach. Right upper quadrant surgical clips are noted. There is no bowel dilatation. Visualized lung bases are grossly clear.  IMPRESSION: Interval  advancement of feeding tube as above.   Electronically Signed   By: Sebastian Ache   On: 03/20/2014 17:12   Dg Abd Portable 1v  03/20/2014   CLINICAL DATA:  PANDA tube insertion.  EXAM: PORTABLE ABDOMEN - 1 VIEW  COMPARISON:  DG LUMBAR SPINE COMPLETE 4+V dated 12/12/2011; DG ABD PORTABLE 1V dated 03/20/2014  FINDINGS: A weighted feeding tube is present with tip in the region of the GE junction/gastric cardia. Right upper quadrant surgical clips are again seen. There is no evidence of bowel dilatation. Visualized lung bases are grossly clear. No acute osseous abnormality is identified.  IMPRESSION: Feeding tube tip near the GE junction.   Electronically Signed   By: Sebastian Ache   On: 03/20/2014 17:11   Dg Vangie Bicker G Tube Plc W/fl-no Rad  03/31/2014   CLINICAL DATA: to manipulate and confirm placement of tube   NASO G TUBE PLACEMENT WITH FLUORO  Fluoroscopy was utilized by the requesting physician.  No radiographic  interpretation.    Dg Swallowing Func-speech Pathology  03/29/2014   Riley Nearing Deblois, CCC-SLP     03/29/2014  3:23 PM Objective Swallowing Evaluation: Modified Barium Swallowing Study   Patient Details  Name: KELSHA OLDER MRN: [id number] Date of Birth: March 18, 1951  Today's Date: 03/28/2014 Time: 1345-1405 SLP Time Calculation (min): 20 min  Past Medical History:  Past Medical History  Diagnosis Date  . Hypertension   . Hiatal hernia   . Adenomatous polyp of colon   . Acute asthmatic bronchitis   . GERD (gastroesophageal reflux disease)     EGD neg 03/07/2008, but prev required dilation  . COPD (chronic obstructive pulmonary disease)   . IBS (irritable bowel syndrome)     chronic  . Urinary incontinence   . Esophagitis   . Chronic gastritis   . Duodenitis without hemorrhage 10/01/2001  . Diverticulosis   . Personal history of colonic adenomas 02/05/2008        . Anemia   . Hyperlipidemia   . Decreased peripheral vision of right eye   . Pneumonia 2011  . Hyperthyroidism   . Type II diabetes mellitus   .  Stroke     a. Hx of stroke and ministrokes ~ 2009. b. TIA 11/2013, dx with  AF at that time.  . Arthritis   . Depression   . On home oxygen therapy     "2L; suppose to use 24/7" (01/05/2014)  . PAF (paroxysmal atrial fibrillation)     a. Prior hx. b. Recurred 11/2013 in setting of possible TIA.  Lifelong Coumadin planned. Spont converted to NSR. EF 60-65% by  echo 11/2013, normal cors 2007, normal nuc 2011.   . Atrial flutter     a. Dx 12/2013. s/p DCCV. On anticoagulation.   Past Surgical History:  Past Surgical History  Procedure Laterality Date  . Colonoscopy    . Upper gi endoscopy    . Cholecystectomy  1980's  . Vaginal hysterectomy  1992  . Breast biopsy Left 1987    "milk gland"  . Cardioversion N/A 01/06/2014    Procedure: CARDIOVERSION;  Surgeon: Lewayne Bunting, MD;   Location: [facility] OR;  Service: Cardiovascular;  Laterality: N/A;   HPI:  63 y.o. female with a history of recent stroke on Wednesday  who  was discharged from [facility] after having coumadin stopped  for some pethechial hemorrhage associated with the hemorrhage.   PMH:  HTN, hiatal hernia, GERD, COPD, esophagitis, chronic  gastritis, pna, CVA 2009.  CXR 3/20 no edema or consolidation.    02/14/14 right lower lobe infiltrate and effusion.  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 Intra-axial  hematoma with estimated volume of 53 mm, plus secondary extension  of hemorrhage into the right lateral ventricle.  Repeat CT 3/21  No new intracranial hemorrhage or infarct identified.   No edema  or consolidation.      Assessment / Plan / Recommendation Clinical Impression  Dysphagia Diagnosis: Severe oral phase dysphagia;Severe  pharyngeal phase dysphagia Clinical impression: Pt demonstrates a severe oral and  oropharyngeal dysphagia due to cognitive deficits and delayed  swallow response. Pt orally holds bolus, sometimes with premature  spillage to the pyriform sinuses;  several seconds pass before a  swallow is initiated. With boluses any larger than teaspoon size,  pt is likely to aspirate before the swallow. Recommend Dys 1  (puree) diet and honey thick liquids given by teaspoon only. SLP  will f/u for tolerance. As pts swallow trigger becomes more  consistent and timely pt may upgrade to nectar.     Treatment Recommendation  Therapy as outlined in treatment plan below    Diet Recommendation Dysphagia 1 (Puree);Honey-thick liquid   Liquid Administration via: Spoon Medication Administration: Crushed with puree Supervision: Trained caregiver to feed patient;Full  supervision/cueing for compensatory strategies Compensations: Slow rate;Small sips/bites Postural Changes and/or Swallow Maneuvers: Seated upright 90  degrees    Other  Recommendations Oral Care Recommendations: Oral care BID Other Recommendations: Order thickener from pharmacy   Follow Up Recommendations  Inpatient Rehab    Frequency and Duration min 2x/week  2 weeks   Pertinent Vitals/Pain NA    SLP Swallow Goals     General HPI: 63 y.o. female with a history of recent stroke on  Wednesday who was discharged from [facility] after having  coumadin stopped for some pethechial hemorrhage associated with  the hemorrhage.  PMH:  HTN, hiatal hernia, GERD, COPD,  esophagitis, chronic gastritis, pna, CVA 2009.  CXR 3/20 no edema  or consolidation.   02/14/14 right lower lobe infiltrate and  effusion.  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 Intra-axial hematoma with estimated volume of 53 mm,  plus secondary extension of hemorrhage into the right lateral  ventricle.  Repeat CT 3/21 No new intracranial hemorrhage or  infarct identified.   No edema or consolidation.  Type of Study: Modified Barium Swallowing Study Reason for Referral: Objectively evaluate swallowing function Previous Swallow Assessment: MBS 3/21 Diet Prior to this Study: NPO  Temperature Spikes Noted: No Respiratory Status: Nasal cannula History of Recent Intubation: No Behavior/Cognition: Impulsive;Requires  cueing;Distractible;Decreased sustained attention;Confused Oral Cavity - Dentition: Missing dentition;Edentulous Oral Motor / Sensory Function: Impaired - see Bedside swallow  eval Self-Feeding Abilities: Total assist Patient Positioning: Postural control interferes with function Baseline Vocal Quality: Clear;Low vocal intensity Volitional Cough: Cognitively unable to elicit Volitional Swallow: Unable to elicit Anatomy: Within functional limits Pharyngeal Secretions: Not observed secondary MBS    Reason for Referral Objectively evaluate swallowing function   Oral Phase Oral Preparation/Oral Phase Oral Phase: Impaired Oral - Honey Oral - Honey Teaspoon: Delayed oral transit;Holding of  bolus;Reduced  posterior propulsion Oral - Nectar Oral - Nectar Teaspoon: Delayed oral transit;Holding of  bolus;Reduced posterior propulsion Oral - Nectar Cup: Delayed oral transit;Holding of bolus;Reduced  posterior propulsion Oral - Solids Oral - Puree: Delayed oral transit;Holding of bolus;Reduced  posterior propulsion   Pharyngeal Phase Pharyngeal Phase Pharyngeal Phase: Impaired Pharyngeal - Honey Pharyngeal - Honey Teaspoon: Premature spillage to pyriform  sinuses;Delayed swallow initiation Penetration/Aspiration details (honey teaspoon): Material does  not enter airway Pharyngeal - Nectar Pharyngeal - Nectar Teaspoon: Delayed swallow  initiation;Premature spillage to pyriform sinuses Pharyngeal - Nectar Cup: Not tested Pharyngeal - Thin Pharyngeal - Thin Teaspoon: Not tested Pharyngeal - Thin Cup: Not tested Pharyngeal - Solids Pharyngeal - Puree: Delayed swallow initiation  Cervical Esophageal Phase    GO             Herbie Baltimore, MA CCC-SLP [phone]  Lynann Beaver 03/28/2014, 2:25 PM    Dg Swallowing Func-speech Pathology  03/23/2014   Orbie Pyo Ewing, CCC-SLP     03/23/2014   3:07 PM Objective Swallowing Evaluation: Modified Barium Swallowing Study   Patient Details  Name: RHINA HOLLINGER MRN: [id number] Date of Birth: 1951-04-03  Today's Date: 03/23/2014 Time: [id number] SLP Time Calculation (min): 20 min  Past Medical History:  Past Medical History  Diagnosis Date  . Hypertension   . Hiatal hernia   . Adenomatous polyp of colon   . Acute asthmatic bronchitis   . GERD (gastroesophageal reflux disease)     EGD neg 03/07/2008, but prev required dilation  . COPD (chronic obstructive pulmonary disease)   . IBS (irritable bowel syndrome)     chronic  . Urinary incontinence   . Esophagitis   . Chronic gastritis   . Duodenitis without hemorrhage 10/01/2001  . Diverticulosis   . Personal history of colonic adenomas 02/05/2008        . Anemia   . Hyperlipidemia   . Decreased peripheral vision of right eye   . Pneumonia 2011  . Hyperthyroidism   . Type II diabetes mellitus   . Stroke     a. Hx of stroke and ministrokes ~ 2009. b. TIA 11/2013, dx with  AF at that time.  . Arthritis   . Depression   . On home oxygen therapy     "2L; suppose to use 24/7" (01/05/2014)  . PAF (paroxysmal atrial fibrillation)     a. Prior hx. b. Recurred 11/2013 in setting of possible TIA.  Lifelong Coumadin planned. Spont converted to NSR. EF 60-65% by  echo 11/2013, normal cors 2007, normal nuc 2011.   . Atrial flutter     a. Dx 12/2013. s/p DCCV. On anticoagulation.   Past Surgical History:  Past Surgical History  Procedure Laterality Date  . Colonoscopy    . Upper gi endoscopy    . Cholecystectomy  1980's  . Vaginal hysterectomy  1992  . Breast biopsy Left 1987    "milk gland"  . Cardioversion N/A 01/06/2014    Procedure: CARDIOVERSION;  Surgeon: Lelon Perla, MD;   Location: [place];  Service: Cardiovascular;  Laterality: N/A;   HPI:  63 y.o. female with a history of recent stroke on Wednesday who  was discharged from [facility] after having coumadin stopped  for some pethechial hemorrhage associated with the hemorrhage.    PMH:  HTN, hiatal hernia, GERD, COPD, esophagitis, chronic  gastritis, pna, CVA 2009.  CXR 3/20 no edema or consolidation.    02/14/14 right lower lobe infiltrate and effusion.  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 Intra-axial  hematoma with estimated volume of 53 mm, plus secondary extension  of hemorrhage into the right lateral ventricle.  Repeat CT 3/21  No new intracranial hemorrhage or infarct identified.   No edema  or consolidation.      Assessment / Plan / Recommendation Clinical Impression  Dysphagia Diagnosis: Moderate pharyngeal phase dysphagia;Mild  oral phase dysphagia;Moderate oral phase dysphagia Clinical impression: Pt. demonstrated mild-moderate oral and  moderate sensorimotor phayrngeal dysphagia (sensory > motor).   Swallows of thin barium initated at pyriform sinuses resulting in  aspiration with strong reflexive cough.  Cognitive deficits  prohibits use of compensatory techniques at present.  Reduced  tongue base retraction and laryngeal elevation led to minimal  intermittent pharyngeal residue. Mildly prolonged oral prep and  transit.  SLP recommends Dys 1 diet texture and nectar thick  liquids, no straws, pills crushed and full supervion for  cognitive impairments including impulsivity.  ST will follow.      Treatment Recommendation  Therapy as outlined in treatment plan below    Diet Recommendation Dysphagia 1 (Puree);Nectar-thick liquid   Liquid Administration via: Cup;No straw Medication Administration: Crushed with puree Supervision: Patient able to self feed;Full supervision/cueing  for compensatory strategies Compensations: Slow rate;Small sips/bites;Check for pocketing Postural Changes and/or Swallow Maneuvers: Seated upright 90  degrees;Upright 30-60 min after meal    Other  Recommendations Oral Care Recommendations: Oral care BID   Follow Up Recommendations  Inpatient Rehab    Frequency and Duration  min 2x/week  2 weeks   Pertinent Vitals/Pain WDL                     Orbie Pyo Litaker M.Ed CCC-SLP Pager [phone]  03/23/2014    Assessment & Plan by Problem: Acute on chronic respiratory failure with fever Etiology unclear.  Patient was maintained on course of empiric Zosyn initiated on 03/24/2014, however patient has fevers despite being on antibiotic therapy.  Suspect patient has ongoing aspiration likely triggering her symptoms.  Chest x-ray shows worsening moderate right sided pleural effusion with associated airspace opacity.  Consider consulting pulmonary for thoracentesis and further management.  Given healthcare associated pneumonia, start the patient on vancomycin and cefepime.  Blood cultures x2 drawn and pending.  Admit the patient to step down for closer monitoring.  Acute hemorrhagic conversion of ischemic CVA Most recent head CT on 04/01/2014 showed stable hematoma.  Continue to monitor.  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  Concern for possible aspiration.  Atrial fibrillation Rate controlled.  Not on anticoagulation due to hemorrhagic bleed.  Hypertension Continue antihypertensive medications.  Recent history of Enterobacter UTI with sepsis Completed 7 day course of Zosyn.  COPD Stable.  Continue to monitor.  May need steroids in the event her breathing were to worsen.  Diabetes Sensitive sliding scale insulin.  Diastolic dysfunction grade 2 Patient appears compensated on exam.  Hyperlipidemia Resume statin once evaluated by speech therapy.  Prophylaxis SCDs, no heparin products given recent history of hemorrhagic conversion of ischemic CVA.  CODE STATUS Full code.  Disposition Admit the patient to step down as inpatient.  Time spent on admission, talking to the patient, and coordinating care was: 60 mins.  Rhen Kawecki A, MD 04/04/2014, 5:58 AM

## 2014-04-04 NOTE — Progress Notes (Signed)
Patient presented as a step-down overflow from rehab.  Patient had a hemorrhagic conversion of an ischemic stroke prior to transfer to rehab.  In rehab evidently had an aspiration pneumonia, acute change in mental likely due to sepsis.  Spoke with daughter over the phone.  They have very limited understanding of how critical and patient is.  Their brother's funeral is tonight however and they are unable to come in tonight and the burial is tomorrow so they would like to meet early with rounding MD.  They wish for full support.  Will intubate patient but I am fairly certain that if explained properly the family would not proceed with trach/peg.  One of the sisters already does not want all this would only like comfort.  I strongly recommended against trach/peg.  At this point, will intubate, mechanically ventilate, f/u ABG and CXR, intermittent sedation only (patient already unresponsive), HCAP coverage with cefepime and vanc, pan culture and place OGT for TF.    Patient definitely need to be DNR comfort care, Dr. Titus Mould to speak with the family in AM.  CC time 45 min.  Rush Farmer, M.D. [facility] Pulmonary/Critical Care Medicine. Pager: [phone]. After hours pager: [phone].

## 2014-04-04 NOTE — Progress Notes (Signed)
SLP Cancellation Note  Patient Details Name: Colleen [name] MRN: [id number] DOB: 09-11-1951   Cancelled treatment:        Pt. Currently unresponsive.  Events of this am noted.  Will sign-off for now.  Please re-order when pt. Is alert and able to participate.   Quinn Axe T 04/04/2014, 1:38 PM

## 2014-04-04 NOTE — Progress Notes (Signed)
UR Completed.  Colleen [name] [id number] 04/04/2014

## 2014-04-04 NOTE — Progress Notes (Signed)
ANTIBIOTIC CONSULT NOTE - INITIAL  Pharmacy Consult for Vancomycin  Indication: pneumonia  Allergies  Allergen Reactions  . Crestor [Rosuvastatin]   . Latex Hives  . Wellbutrin [Bupropion]     Patient Measurements: Height: 5\' 5"  (165.1 cm) Weight: 139 lb 8.8 oz (63.3 kg) IBW/kg (Calculated) : 57 Vital Signs: Temp: 98.5 F (36.9 C) (04/06 0800) Temp src: Oral (04/06 0800) BP: 157/97 mmHg (04/06 1200) Pulse Rate: 67 (04/06 1200)  Medical History: Past Medical History  Diagnosis Date  . Hypertension   . Hiatal hernia   . Adenomatous polyp of colon   . Acute asthmatic bronchitis   . GERD (gastroesophageal reflux disease)     EGD neg 03/07/2008, but prev required dilation  . COPD (chronic obstructive pulmonary disease)   . IBS (irritable bowel syndrome)     chronic  . Urinary incontinence   . Esophagitis   . Chronic gastritis   . Duodenitis without hemorrhage 10/01/2001  . Diverticulosis   . Personal history of colonic adenomas 02/05/2008        . Anemia   . Hyperlipidemia   . Decreased peripheral vision of right eye   . Pneumonia 2011  . Hyperthyroidism   . Type II diabetes mellitus   . Stroke     a. Hx of stroke and ministrokes ~ 2009. b. TIA 11/2013, dx with AF at that time.  . Arthritis   . Depression   . On home oxygen therapy     "2L; suppose to use 24/7" (01/05/2014)  . PAF (paroxysmal atrial fibrillation)     a. Prior hx. b. Recurred 11/2013 in setting of possible TIA. Lifelong Coumadin planned. Spont converted to NSR. EF 60-65% by echo 11/2013, normal cors 2007, normal nuc 2011.   . Atrial flutter     a. Dx 12/2013. s/p DCCV. On anticoagulation.   Assessment: 63 y/o pt with airspace opacity on CXR this AM. On Zosyn since 3/26 for HCAP, to broaden coverage with Vancomycin and switch to Cefepime. Tmax 103, WBC elevated. Renal function wnl and stable (CrCl ~57 ml/min).   Goal of Therapy:  Vancomycin trough level 15-20 mcg/ml  Plan:  -Vancomycin 500 mg IV  q12h -Cefepime 1gm IV q8h -Trend WBC, temp, renal function, C&S, levels prn   [place], Namita Yearwood K 04/04/2014,2:45 PM

## 2014-04-04 NOTE — Significant Event (Signed)
Rapid Response Event Note  Overview: Time Called: 3545 Arrival Time: 0500 Event Type: Respiratory  Initial Focused Assessment: Called by bedside RN regarding increased WOB and increased RR. Upon arrival to room patient in acute distress, RR 34 HR 90-100, SBP 180, temp 103 rectal, O2 96% on 2L. Patient suctioned and copious amounts of thick tan secretions. Patient has weak ineffective cough. Rhonchi noted in all lobes, patient warm and diaphoretic. Accessory muscles being used. See doc flow sheet for additional vital signs  Interventions: [facility] consulted by attending MD for admission back to [facility] ABG and Chest Xray obtained per protocol, tylenol given per tube, PIV inserted in left forearm. [facility]ad MD at bedside, orders received to transfer to SDU. Family updated, patient transported to 2M15 with bedside RN and RRT RN.   Event Summary: Name of Physician Notified: Alysia Penna, MD at [place]  Name of Consulting Physician Notified: Dr. Reece Levy at [phone]  Outcome: Transferred (Comment) [phone])  Event End Time: [place], Romona Curls

## 2014-04-04 NOTE — Progress Notes (Signed)
PHYSICAL MEDICINE & REHABILITATION     PROGRESS NOTE    Subjective/Complaints: Developed increased respiratory rate last noc, spike temp to 103, suctioned for copious amounts of secretions A 12 ROS limited by cognitive   Objective: Vital Signs: Blood pressure 181/91, pulse 96, temperature 100.1 F (37.8 C), temperature source Axillary, resp. rate 34, weight 69.6 kg (153 lb 7 oz), SpO2 95.00%. Dg Chest 2 View  04/03/2014   CLINICAL DATA:  Unresponsive  EXAM: CHEST  2 VIEW  COMPARISON:  03/30/2014  FINDINGS: Cardiomegaly again noted. There is moderate size right pleural effusion with right basilar atelectasis or infiltrate. Left lung is clear. No pulmonary edema. NG tube in place.  IMPRESSION: Moderate size right pleural effusion with right basilar atelectasis or infiltrate. No pulmonary edema.   Electronically Signed   By: Lahoma Crocker M.D.   On: 04/03/2014 14:40   Dg Chest Port 1 View  04/04/2014   CLINICAL DATA:  Respiratory distress.  EXAM: PORTABLE CHEST - 1 VIEW  COMPARISON:  Chest radiograph performed 04/03/2014  FINDINGS: The patient's enteric tube is noted extending below the diaphragm, with the side port at the gastroesophageal junction.  There is an increasing moderate right-sided pleural effusion, with associated airspace opacity. The left lung appears clear. No pneumothorax is seen.  The cardiomediastinal silhouette is normal in size. No acute osseous abnormalities are identified.  IMPRESSION: Apparent increasing moderate right-sided pleural effusion, with associated airspace opacity.   Electronically Signed   By: Garald Balding M.D.   On: 04/04/2014 05:49   No results found for this basename: WBC, HGB, HCT, PLT,  in the last 72 hours No results found for this basename: NA, K, CL, CO, GLUCOSE, BUN, CREATININE, CALCIUM,  in the last 72 hours CBG (last 3)   Recent Labs  04/03/14 2050 04/04/14 0011 04/04/14 0412  GLUCAP 105* 122* 105*    Wt Readings from Last 3  Encounters:  04/03/14 69.6 kg (153 lb 7 oz)  03/29/14 67.6 kg (149 lb 0.5 oz)  02/19/14 88.2 kg (194 lb 7.1 oz)    Physical Exam:  Constitutional:  Bilateral wrist restraints Slow to arouse HENT: oral mucosa pink and moist  Head: Normocephalic. atraumatic  Eyes:  Closed will not open to command Neck: Normal range of motion. Neck supple. No thyromegaly present.  Cardiovascular:  Cardiac rate controlled  Respiratory: scattered rhonchi, poor air movement as a whole due to decreased exp/ins effort GI: Soft. Bowel sounds are normal. She exhibits no distension.  Neurological:  Pt is lethargic. Left delt,bicep,tricep, hand, HF, KE, ADP/APF 0/5. No resting tone. Sensation 1/2 to LT, senses pain L>R  Skin: Skin is warm and dry.  Psychiatric:  lethargy   Assessment/Plan: 1. 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 now with respiratory distress, discussed with [facility] hs[facility]italist, transfer to Step down unit    Discussed care  FIM: FIM - Bathing Bathing: 0: Activity did not occur  FIM - Upper Body Dressing/Undressing Upper body dressing/undressing: 1: Two helpers FIM - Lower Body Dressing/Undressing Lower body dressing/undressing: 1: Two helpers        FIM - Control and instrumentation engineer Devices: HOB elevated Bed/Chair Transfer: 1: Two helpers  FIM - Locomotion: Wheelchair Distance: 0 Locomotion: Wheelchair: 0: Activity did not occur FIM - Locomotion: Ambulation Ambulation/Gait Assistance: Not tested (comment) Locomotion: Ambulation: 0: Activity did not occur  Comprehension Comprehension Mode: Auditory Comprehension: 1-Understands basic less than 25% of the time/requires cueing 75% of  the time  Expression Expression Mode: Verbal Expression: 1-Expresses basis less than 25% of the time/requires cueing greater than 75% of the time.  Social Interaction Social Interaction: 1-Interacts appropriately  less than 25% of the time. May be withdrawn or combative.  Problem Solving Problem Solving: 1-Solves basic less than 25% of the time - needs direction nearly all the time or does not effectively solve problems and may need a restraint for safety  Memory Memory: 1-Recognizes or recalls less than 25% of the time/requires cueing greater than 75% of the time  Medical Problem List and Plan:  1. 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 COPD. Patient with home oxygen. Continue Pulmicort nebulizer. Check oxygen saturations every shift.RR increased check CXR high risk for aspiration afeb on Zosyn 4. Type 2 diabetes mellitus. Continue sliding scale for now. Check blood sugars as directed 5.  HTN- add ramipril 6. Dysphagia severe will need PEG +/- trach, consider palliative for goals of care to involve family LOS (Days) 6 A FACE TO FACE EVALUATION WAS PERFORMED  Alysia Penna E 04/04/2014 5:57 AM

## 2014-04-04 NOTE — Progress Notes (Signed)
Dr. Letta Pate notified of patient with labored breathing with blank stare; vs--BP 182/73, HR 102, temp 103 (rectally), O2 SAT 95% on 2 L/M; 2-view chest xray showed rt atelectasis in the lower lobe; keeping patient turned q 2 hrs; having to keep HOB at 90 degrees because patient struggles to breath at 30 degrees; patient on scheduled breathing txs of Pulmicort; and urine cx has been sent.  Orders received:  Deep suction from respiratory therapist; contact rapid response; and blood cultures X 2 due to high temp.

## 2014-04-04 NOTE — Procedures (Signed)
Intubation Procedure Note Colleen [name] [id number] [birth year]  Procedure: Intubation Indications: Airway protection and maintenance  Procedure Details Consent: Risks of procedure as well as the alternatives and risks of each were explained to the (patient/caregiver).  Consent for procedure obtained. Time Out: Verified patient identification, verified procedure, site/side was marked, verified correct patient position, special equipment/implants available, medications/allergies/relevent history reviewed, required imaging and test results available.  Performed  Maximum sterile technique was used including antiseptics, gloves, hand hygiene and mask.  MAC    Evaluation Hemodynamic Status: BP stable throughout; O2 sats: stable throughout Patient's Current Condition: stable Complications: No apparent complications Patient did tolerate procedure well. Chest X-ray ordered to verify placement.  CXR: pending.   [name] 04/04/2014

## 2014-04-04 NOTE — Progress Notes (Signed)
ANTIBIOTIC CONSULT NOTE - INITIAL  Pharmacy Consult for Vancomycin  Indication: pneumonia  Allergies  Allergen Reactions  . Crestor [Rosuvastatin]   . Latex Hives  . Wellbutrin [Bupropion]     Patient Measurements: Weight: 153 lb 7 oz (69.6 kg) Vital Signs: BP: 127/73 mmHg (04/06 0604) Pulse Rate: 94 (04/06 0604)  Medical History: Past Medical History  Diagnosis Date  . Hypertension   . Hiatal hernia   . Adenomatous polyp of colon   . Acute asthmatic bronchitis   . GERD (gastroesophageal reflux disease)     EGD neg 03/07/2008, but prev required dilation  . COPD (chronic obstructive pulmonary disease)   . IBS (irritable bowel syndrome)     chronic  . Urinary incontinence   . Esophagitis   . Chronic gastritis   . Duodenitis without hemorrhage 10/01/2001  . Diverticulosis   . Personal history of colonic adenomas 02/05/2008        . Anemia   . Hyperlipidemia   . Decreased peripheral vision of right eye   . Pneumonia 2011  . Hyperthyroidism   . Type II diabetes mellitus   . Stroke     a. Hx of stroke and ministrokes ~ 2009. b. TIA 11/2013, dx with AF at that time.  . Arthritis   . Depression   . On home oxygen therapy     "2L; suppose to use 24/7" (01/05/2014)  . PAF (paroxysmal atrial fibrillation)     a. Prior hx. b. Recurred 11/2013 in setting of possible TIA. Lifelong Coumadin planned. Spont converted to NSR. EF 60-65% by echo 11/2013, normal cors 2007, normal nuc 2011.   . Atrial flutter     a. Dx 12/2013. s/p DCCV. On anticoagulation.   Assessment: 63 y/o pt with airspace opacity on CXR this AM, already on Zosyn, to broaden coverage with Vancomycin.   Goal of Therapy:  Vancomycin trough level 15-20 mcg/ml  Plan:  -Vancomycin 500 mg IV q12h -Zosyn per MD -Trend WBC, temp, renal function  -BMET this AM, hasn't had one since 4/1 -Drug levels as indicated  Narda Bonds 04/04/2014,6:19 AM

## 2014-04-04 NOTE — Procedures (Signed)
Central Venous Catheter Insertion Procedure Note Colleen [name] [id number] [birth year]  Procedure: Insertion of Central Venous Catheter Indications: Assessment of intravascular volume, Drug and/or fluid administration and Frequent blood sampling  Procedure Details Consent: Risks of procedure as well as the alternatives and risks of each were explained to the (patient/caregiver).  Consent for procedure obtained. Time Out: Verified patient identification, verified procedure, site/side was marked, verified correct patient position, special equipment/implants available, medications/allergies/relevent history reviewed, required imaging and test results available.  Performed  Maximum sterile technique was used including antiseptics, cap, gloves, gown, hand hygiene, mask and sheet. Skin prep: Chlorhexidine; local anesthetic administered A antimicrobial bonded/coated triple lumen catheter was placed in the right internal jugular vein using the Seldinger technique.  Evaluation Blood flow good Complications: No apparent complications Patient did tolerate procedure well. Chest X-ray ordered to verify placement.  CXR: pending.  U/S used in placement.  Colleen [name] 04/04/2014, 4:44 PM

## 2014-04-04 NOTE — Progress Notes (Signed)
This RN called to [facility] hospitalist to see patient as morning assessment seems different than patients "baseline" from rehab. Patient non-verbal per Rehab RN, however this am patient not following commands, minimally responsive to painful stimulus, not-interactive and having periods of bradycardia as low as 42bpm. Noted patient is still a full code.   [facility] hospitalist came to see patient and aware of patient status. Patient did groan when stimulated but this is the extent of interaction.   Will continue to monitor patient and notify with any changes.   Susano Cleckler R

## 2014-04-04 NOTE — Progress Notes (Signed)
[facility] PROGRESS NOTE   Colleen [name] UXN:[id number] DOB: Jan 13, 1951 DOA: 04/04/2014 PCP: [name], NP  HPI/Subjective: Seen with ICU RN at bedside. Patient is full code, nonverbal, responds to painful stimuli  Assessment/Plan: Principal Problem:   Acute and chronic respiratory failure Active Problems:   HYPERLIPIDEMIA   HYPERTENSION, BENIGN   Atrial fibrillation   GASTROESOPHAGEAL REFLUX DISEASE   CHEST PAIN   Diabetes mellitus   Diastolic dysfunction grade 2 by Echo 12/14   HAP (hospital-acquired pneumonia)   Acute hemorrhagic infarction of brain   Fever, unspecified   Pneumonia    Acute on chronic respiratory failure with fever  -Patient was recently treated for at Versus aspiration pneumonia with Zosyn for 7 days. -Patient will acute respiratory failure and sent to the hospital for further evaluation. -Chest x-ray showed worsening of the right-sided pleural effusion and airspace opacity. -With fever, restarted on antibiotics.  Sepsis -Fever of 103, respiration of 31 and pneumonia. -Treat with Zosyn and vancomycin  Acute hemorrhagic conversion of ischemic CVA  -Most recent head CT on 04/01/2014 showed stable hematoma. Continue to monitor.   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 Concern for possible aspiration.   Atrial fibrillation  -Rate controlled. Not on anticoagulation due to hemorrhagic bleed.   Hypertension  -Continue antihypertensive medications.   Recent history of Enterobacter UTI with sepsis  -Completed 7 day course of Zosyn.   COPD  -Stable. Continue to monitor. May need steroids in the event her breathing were to worsen.   Diabetes  -Sensitive sliding scale insulin.   Diastolic dysfunction grade 2  Patient appears compensated on exam.   Hyperlipidemia  Resume statin once evaluated by speech therapy.   Code Status: Full code. Family Communication: Plan discussed with the patient. Disposition Plan:  Remains inpatient   Consultants:  None  Procedures:  None  Antibiotics:  None   Objective: Filed Vitals:   04/04/14 1200  BP: 157/97  Pulse: 67  Temp:   Resp: 29    Intake/Output Summary (Last 24 hours) at 04/04/14 1443 Last data filed at 04/04/14 1300  Gross per 24 hour  Intake     80 ml  Output    225 ml  Net   -145 ml   Filed Weights   04/04/14 0645  Weight: 63.3 kg (139 lb 8.8 oz)    Exam: General: Alert and awake, oriented x3, not in any acute distress. HEENT: anicteric sclera, pupils reactive to light and accommodation, EOMI CVS: S1-S2 clear, no murmur rubs or gallops Chest: clear to auscultation bilaterally, no wheezing, rales or rhonchi Abdomen: soft nontender, nondistended, normal bowel sounds, no organomegaly Extremities: no cyanosis, clubbing or edema noted bilaterally Neuro: Cranial nerves II-XII intact, no focal neurological deficits  Data Reviewed: Basic Metabolic Panel:  Recent Labs Lab 03/29/14 0314 03/30/14 0602 04/04/14 0833  NA 141 139 138  K 3.2* 3.8 5.2  CL 91* 93* 91*  CO2 34* 32 33*  GLUCOSE 107* 126* 105*  BUN 15 11 17   CREATININE 0.46* 0.40* 0.53  CALCIUM 9.0 9.2 9.5  MG 1.9  --   --    Liver Function Tests:  Recent Labs Lab 03/29/14 0314 03/30/14 0602 04/04/14 0833  AST 27 29 28   ALT 17 17 10   ALKPHOS 71 77 58  BILITOT 0.6 0.6 0.4  PROT 6.7 7.1 7.0  ALBUMIN 2.4* 2.7* 2.5*   No results found for this basename: LIPASE, AMYLASE,  in the last 168 hours No results  found for this basename: AMMONIA,  in the last 168 hours CBC:  Recent Labs Lab 03/29/14 0314 03/30/14 0602 03/31/14 0748 04/04/14 0833  WBC 12.2* 16.9* 14.2* 14.3*  NEUTROABS  --  12.5*  --   --   HGB 11.1* 12.0 13.0 12.3  HCT 38.2 41.4 43.7 41.1  MCV 77.0* 77.5* 77.5* 78.4  PLT 208 232 263 291   Cardiac Enzymes: No results found for this basename: CKTOTAL, CKMB, CKMBINDEX, TROPONINI,  in the last 168 hours BNP (last 3 results)  Recent  Labs  02/14/14 1729  PROBNP 6018.0*   CBG:  Recent Labs Lab 04/04/14 0011 04/04/14 0412 04/04/14 0640 04/04/14 0911 04/04/14 1307  GLUCAP 122* 105* 128* 102* 102*    Micro Recent Results (from the past 240 hour(s))  CLOSTRIDIUM DIFFICILE BY PCR     Status: None   Collection Time    03/29/14  9:22 PM      Result Value Ref Range Status   C difficile by pcr NEGATIVE  NEGATIVE Final     Studies: Dg Chest 2 View  04/03/2014   CLINICAL DATA:  Unresponsive  EXAM: CHEST  2 VIEW  COMPARISON:  03/30/2014  FINDINGS: Cardiomegaly again noted. There is moderate size right pleural effusion with right basilar atelectasis or infiltrate. Left lung is clear. No pulmonary edema. NG tube in place.  IMPRESSION: Moderate size right pleural effusion with right basilar atelectasis or infiltrate. No pulmonary edema.   Electronically Signed   By: Lahoma Crocker M.D.   On: 04/03/2014 14:40   Dg Chest Port 1 View  04/04/2014   CLINICAL DATA:  Respiratory distress.  EXAM: PORTABLE CHEST - 1 VIEW  COMPARISON:  Chest radiograph performed 04/03/2014  FINDINGS: The patient's enteric tube is noted extending below the diaphragm, with the side port at the gastroesophageal junction.  There is an increasing moderate right-sided pleural effusion, with associated airspace opacity. The left lung appears clear. No pneumothorax is seen.  The cardiomediastinal silhouette is normal in size. No acute osseous abnormalities are identified.  IMPRESSION: Apparent increasing moderate right-sided pleural effusion, with associated airspace opacity.   Electronically Signed   By: Garald Balding M.D.   On: 04/04/2014 05:49    Scheduled Meds: . antiseptic oral rinse  15 mL Mouth Rinse q12n4p  . ceFEPime (MAXIPIME) IV  1 g Intravenous Q8H  . chlorhexidine  15 mL Mouth Rinse BID  . digoxin  0.125 mg Oral Daily  . diltiazem  90 mg Oral TID PC & HS  . insulin aspart  0-9 Units Subcutaneous 6 times per day  . methimazole  5 mg Oral Daily  .  metoprolol tartrate  100 mg Oral TID  . vancomycin  500 mg Intravenous Q12H   Continuous Infusions:      Time spent: 35 minutes    [facility] A  [facility] Pager [phone] If 7PM-7AM, please contact night-coverage at [email or web address], password [facility] 04/04/2014, 2:43 PM  LOS: 0 days

## 2014-04-04 NOTE — H&P (Signed)
PULMONARY / CRITICAL CARE MEDICINE   Name: Colleen [name] MRN: [id number] DOB: [birth year]    ADMISSION DATE:  04/04/2014 CONSULTATION DATE:  04/04/14  REFERRING MD : Dr. Bynum Bellows, [facility]  PRIMARY SERVICE: TRH  CHIEF COMPLAINT: Fever and shortness of breath   BRIEF PATIENT DESCRIPTION:   Colleen [name] is a 63 y.o. Caucasian female with history of COPD, type 2 diabetes, hypertension, atrial fibrillation, and recent right occipital infarct.   She was on Coumadin which was discontinued due to petechial hemorrhage as shown on CT-head on 03/16/14. The patient was recently admitted to ICU and treated for HCAP. Patient was placed on a feeding tube for nutrition on 03/28/2012. She was subsequently discharged to inpatient rehabilitation. She has continued Zosy Patient has also been on valproate which is therapeutic per valproic acid level test on 04/01/14. The patient developed low-grade fevers which has been progressively worsening. She had a fever of 103 and subsequently became tachypnea. Patient was noticed to have copious amount of secretions. The patient was admitted to [facility]. She is comatose status and can not protect her airway and therefore transferred to ICU.   SIGNIFICANT EVENTS / STUDIES:  4/3- Stable hematoma in the right temporal and occipital lobes with subfalcine herniation. Stable lateral ventricular size with trace hemorrhage in the left  lateral ventricle.  4/6-CXR, moderate right-sided pleural effusion, with associated airspace opacity. 4/6 CT-head:   LINES / TUBES: 4/6-ETT 4/6-CVL  CULTURES: 4/6- respiratory culture>> 4/6- blood culture 4/6- Urine culture  ANTIBIOTICS: 3/26- Zosyn>>4/5 4/6-Vancomycin 4/6-Cefpeme   HISTORY OF PRESENT ILLNESS:   Colleen [name] is a 63 y.o. Caucasian female with history of COPD, type 2 diabetes, hypertension, atrial fibrillation, and recent right occipital infarct.   She was on Coumadin which was discontinued due to petechial hemorrhage as  shown on CT-head on 03/16/14. The patient was recently admitted to ICU. She was evaluated by neurology service. She was treated with valproate 500 mg every 8 hours. Patient's hospital course was complicated by HCAP for which she was continued on empiric Zosyn. Patient was placed on a feeding tube for nutrition on 03/28/2012. She was subsequently discharged to inpatient rehabilitation. Her valproate level was therapeutic per valproic acid level test on 04/01/14. The patient developed low-grade fevers which has been progressively worsening. She had a fever of 103 and subsequently became tachypnea. Patient was noticed to have copious amount of secretions. The patient was admitted to [facility]. She is comatose status and can not protect her airway and therefore transferred to ICU.  PAST MEDICAL HISTORY :  Past Medical History  Diagnosis Date  . Hypertension   . Hiatal hernia   . Adenomatous polyp of colon   . Acute asthmatic bronchitis   . GERD (gastroesophageal reflux disease)     EGD neg 03/07/2008, but prev required dilation  . COPD (chronic obstructive pulmonary disease)   . IBS (irritable bowel syndrome)     chronic  . Urinary incontinence   . Esophagitis   . Chronic gastritis   . Duodenitis without hemorrhage 10/01/2001  . Diverticulosis   . Personal history of colonic adenomas 02/05/2008        . Anemia   . Hyperlipidemia   . Decreased peripheral vision of right eye   . Pneumonia 2011  . Hyperthyroidism   . Type II diabetes mellitus   . Stroke     a. Hx of stroke and ministrokes ~ 2009. b. TIA 11/2013, dx with AF at that time.  [name]  Arthritis   . Depression   . On home oxygen therapy     "2L; suppose to use 24/7" (01/05/2014)  . PAF (paroxysmal atrial fibrillation)     a. Prior hx. b. Recurred 11/2013 in setting of possible TIA. Lifelong Coumadin planned. Spont converted to NSR. EF 60-65% by echo 11/2013, normal cors 2007, normal nuc 2011.   . Atrial flutter     a. Dx 12/2013. s/p DCCV. On  anticoagulation.   Past Surgical History  Procedure Laterality Date  . Colonoscopy    . Upper gi endoscopy    . Cholecystectomy  1980's  . Vaginal hysterectomy  1992  . Breast biopsy Left 1987    "milk gland"  . Cardioversion N/A 01/06/2014    Procedure: CARDIOVERSION;  Surgeon: Lelon Perla, MD;  Location: [facility] OR;  Service: Cardiovascular;  Laterality: N/A;   Prior to Admission medications   Medication Sig Start Date End Date Taking? Authorizing Provider  ALPRAZolam Duanne Moron) 0.5 MG tablet Take 0.5 mg by mouth at bedtime.    Yes Historical Provider, MD  atorvastatin (LIPITOR) 20 MG tablet Take 1 tablet (20 mg total) by mouth every evening. 01/06/14  Yes Dayna N Dunn, PA-C  cetirizine (ZYRTEC) 10 MG tablet Take 10 mg by mouth at bedtime.    Yes Historical Provider, MD  digoxin (LANOXIN) 0.25 MG tablet Take 0.5 tablets (0.125 mg total) by mouth daily. 03/03/14  Yes Lelon Perla, MD  diltiazem (CARDIZEM CD) 360 MG 24 hr capsule Take 1 capsule (360 mg total) by mouth daily. 02/19/14  Yes Cherene Altes, MD  DULoxetine (CYMBALTA) 60 MG capsule Take 120 mg by mouth daily.   Yes Historical Provider, MD  ferrous sulfate 325 (65 FE) MG tablet Take 325 mg by mouth 2 (two) times daily with a meal.   Yes Historical Provider, MD  Fluticasone-Salmeterol (ADVAIR DISKUS) 250-50 MCG/DOSE AEPB Inhale 1 puff into the lungs 2 (two) times daily.    Yes Historical Provider, MD  furosemide (LASIX) 40 MG tablet Take 40 mg by mouth daily.    Yes Historical Provider, MD  HYDROcodone-acetaminophen (NORCO) 7.5-325 MG per tablet Take 1 tablet by mouth every 6 (six) hours as needed for moderate pain.   Yes Historical Provider, MD  levalbuterol [facility] HFA) 45 MCG/ACT inhaler Inhale 2 puffs into the lungs every 4 (four) hours as needed for wheezing or shortness of breath.   Yes Historical Provider, MD  levalbuterol Penne Lash) 0.63 MG/3ML nebulizer solution Take 3 mLs (0.63 mg total) by nebulization every 3 (three)  hours as needed for wheezing or shortness of breath. 02/19/14  Yes Cherene Altes, MD  metFORMIN (GLUMETZA) 1000 MG (MOD) 24 hr tablet Take 1,000 mg by mouth 2 (two) times daily with a meal.   Yes Historical Provider, MD  methimazole (TAPAZOLE) 5 MG tablet Take 5 mg by mouth daily.   Yes Historical Provider, MD  metoprolol tartrate (LOPRESSOR) 25 MG tablet Take 3 tablets (75 mg total) by mouth 2 (two) times daily. 02/19/14  Yes Cherene Altes, MD  omeprazole (PRILOSEC) 40 MG capsule Take 40 mg by mouth 2 (two) times daily.    Yes Historical Provider, MD  solifenacin (VESICARE) 10 MG tablet Take 10 mg by mouth daily.   Yes Historical Provider, MD  tiotropium (SPIRIVA) 18 MCG inhalation capsule Place 18 mcg into inhaler and inhale daily.    Yes Historical Provider, MD  trimethoprim (TRIMPEX) 100 MG tablet Take 100 mg by mouth at bedtime.  Yes Historical Provider, MD   Allergies  Allergen Reactions  . Crestor [Rosuvastatin]   . Latex Hives  . Wellbutrin [Bupropion]     FAMILY HISTORY:  Family History  Problem Relation Age of Onset  . Heart disease Mother   . Asthma Father   . Lung cancer Father   . Asthma Sister   . Heart disease Sister   . Colon cancer Father    SOCIAL HISTORY:  reports that she has been smoking Cigarettes.  She has a 51 pack-year smoking history. She has never used smokeless tobacco. She reports that she does not drink alcohol or use illicit drugs.  REVIEW OF SYSTEMS:  Patient is in comatose status. Could not do ROS.   SUBJECTIVE:   VITAL SIGNS: Temp:  [98.5 F (36.9 C)-103 F (39.4 C)] 98.5 F (36.9 C) (04/06 0800) Pulse Rate:  [61-98] 61 (04/06 1400) Resp:  [18-34] 27 (04/06 1400) BP: (99-181)/(68-97) 150/84 mmHg (04/06 1400) SpO2:  [95 %-99 %] 96 % (04/06 1400) Weight:  [139 lb 8.8 oz (63.3 kg)] 139 lb 8.8 oz (63.3 kg) (04/06 0645) HEMODYNAMICS:   VENTILATOR SETTINGS:   INTAKE / OUTPUT: Intake/Output     04/05 0701 - 04/06 0700 04/06 0701 -  04/07 0700   NG/GT  30   IV Piggyback  150   Total Intake(mL/kg)  180 (2.8)   Urine (mL/kg/hr)  225 (0.4)   Total Output   225   Net   -45        Urine Occurrence 1 x 2 x    PHYSICAL EXAMINATION: General: the patient is intubated and sedated. Neuro: unresponsive HEENT: Pupil equal round and reactive to the light Cardiovascular: irregularly irregular rhythm Lungs: diffused rales bilaterally Abdomen: soft, distended. Bowel sound present Musculoskeletal: moves toes to pain stimulus on both sidwe Skin: no rashes.  LABS:  CBC  Recent Labs Lab 03/30/14 0602 03/31/14 0748 04/04/14 0833  WBC 16.9* 14.2* 14.3*  HGB 12.0 13.0 12.3  HCT 41.4 43.7 41.1  PLT 232 263 291   Coag's No results found for this basename: APTT, INR,  in the last 168 hours BMET  Recent Labs Lab 03/29/14 0314 03/30/14 0602 04/04/14 0833  NA 141 139 138  K 3.2* 3.8 5.2  CL 91* 93* 91*  CO2 34* 32 33*  BUN 15 11 17   CREATININE 0.46* 0.40* 0.53  GLUCOSE 107* 126* 105*   Electrolytes  Recent Labs Lab 03/29/14 0314 03/30/14 0602 04/04/14 0833  CALCIUM 9.0 9.2 9.5  MG 1.9  --   --    Sepsis Markers No results found for this basename: LATICACIDVEN, PROCALCITON, O2SATVEN,  in the last 168 hours ABG  Recent Labs Lab 04/04/14 0505  PHART 7.405  PCO2ART 62.8*  PO2ART 82.7   Liver Enzymes  Recent Labs Lab 03/29/14 0314 03/30/14 0602 04/04/14 0833  AST 27 29 28   ALT 17 17 10   ALKPHOS 71 77 58  BILITOT 0.6 0.6 0.4  ALBUMIN 2.4* 2.7* 2.5*   Cardiac Enzymes No results found for this basename: TROPONINI, PROBNP,  in the last 168 hours Glucose  Recent Labs Lab 04/03/14 2050 04/04/14 0011 04/04/14 0412 04/04/14 0640 04/04/14 0911 04/04/14 1307  GLUCAP 105* 122* 105* 128* 102* 102*    Imaging Dg Chest 2 View  04/03/2014   CLINICAL DATA:  Unresponsive  EXAM: CHEST  2 VIEW  COMPARISON:  03/30/2014  FINDINGS: Cardiomegaly again noted. There is moderate size right pleural  effusion with right basilar atelectasis  or infiltrate. Left lung is clear. No pulmonary edema. NG tube in place.  IMPRESSION: Moderate size right pleural effusion with right basilar atelectasis or infiltrate. No pulmonary edema.   Electronically Signed   By: Lahoma Crocker M.D.   On: 04/03/2014 14:40   Dg Chest Port 1 View  04/04/2014   CLINICAL DATA:  Respiratory distress.  EXAM: PORTABLE CHEST - 1 VIEW  COMPARISON:  Chest radiograph performed 04/03/2014  FINDINGS: The patient's enteric tube is noted extending below the diaphragm, with the side port at the gastroesophageal junction.  There is an increasing moderate right-sided pleural effusion, with associated airspace opacity. The left lung appears clear. No pneumothorax is seen.  The cardiomediastinal silhouette is normal in size. No acute osseous abnormalities are identified.  IMPRESSION: Apparent increasing moderate right-sided pleural effusion, with associated airspace opacity.   Electronically Signed   By: Garald Balding M.D.   On: 04/04/2014 05:49   ASSESSMENT / PLAN:  PULMONARY A: mostly likely aspiration PNA  P:   -IV Vancomycine and Cefepime -blood culture and respiratory culture -intubation  CARDIOVASCULAR A:  - A Fib, Hr is ~60 to 100 /min. 2D echo on 3/21/15s showed EF 55 to 60% without wall motion abnormality.  -  P:  - Digoxin 0.125 mg daily - Check digoxin levle - Metoprolol 75 mg bid - Diltiazem 90 mg tid - SCD for DVT ppx   RENAL A:  Mild hypochloriemia - Normal K and Creatinine  P:   - IVF:  - monitor BMP - check Mg and Phosphorus.  GASTROINTESTINAL A: No acute issue P:   - IV pepcide  HEMATOLOGIC A:  Leukocytosis secondary to aspiration PNA.  P:  -Monitor CBC  INFECTIOUS A: aspiration PNA P:   - Antibiotic as above: Vancomycin and Cefpeme - Pending blood and urine culture - UA  ENDOCRINE A:  DM-II, A1c 6.1 on 03/19/14 P:   - SSI-senstive  NEUROLOGIC A:   Hx of ischemic stroke. Now acute  hemorrhagic conversion of ischemic CVA. Airway compromise P:   - Repeat CT-head without contrast - No anticoagulant - Intubation to protect airway - NPO  - Labetalol gtt to Control bp: goal of SBP<120 mmHg  TODAY'S SUMMARY:  I have personally obtained a history, examined the patient, evaluated laboratory and imaging results, formulated the assessment and plan and placed orders.  CRITICAL CARE: The patient is critically ill with multiple organ systems failure and requires high complexity decision making for assessment and support, frequent evaluation and titration of therapies, application of advanced monitoring technologies and extensive interpretation of multiple databases. Critical Care Time devoted to patient care services described in this note is 35 minutes.   Rush Farmer, M.D. Pulmonary and [place] Pager: [phone]  04/04/2014, 3:56 PM

## 2014-04-04 NOTE — Progress Notes (Signed)
Patient transfered to 2100 via stretcher at [place] in care of [facility]. adm

## 2014-04-05 ENCOUNTER — Inpatient Hospital Stay (HOSPITAL_COMMUNITY): Payer: Medicaid Other

## 2014-04-05 ENCOUNTER — Other Ambulatory Visit: Payer: Self-pay

## 2014-04-05 DIAGNOSIS — E43 Unspecified severe protein-calorie malnutrition: Secondary | ICD-10-CM | POA: Insufficient documentation

## 2014-04-05 DIAGNOSIS — I359 Nonrheumatic aortic valve disorder, unspecified: Secondary | ICD-10-CM

## 2014-04-05 DIAGNOSIS — J209 Acute bronchitis, unspecified: Secondary | ICD-10-CM

## 2014-04-05 LAB — URINE CULTURE: Colony Count: 30000

## 2014-04-05 LAB — BLOOD GAS, ARTERIAL
Acid-Base Excess: 9.8 mmol/L — ABNORMAL HIGH (ref 0.0–2.0)
Bicarbonate: 34.1 mEq/L — ABNORMAL HIGH (ref 20.0–24.0)
Drawn by: 36277
FIO2: 0.4 %
O2 SAT: 97 %
PATIENT TEMPERATURE: 99.9
PEEP: 5 cmH2O
PH ART: 7.448 (ref 7.350–7.450)
RATE: 18 resp/min
TCO2: 35.6 mmol/L (ref 0–100)
VT: 450 mL
pCO2 arterial: 50.4 mmHg — ABNORMAL HIGH (ref 35.0–45.0)
pO2, Arterial: 96.9 mmHg (ref 80.0–100.0)

## 2014-04-05 LAB — BASIC METABOLIC PANEL
BUN: 24 mg/dL — AB (ref 6–23)
CO2: 32 mEq/L (ref 19–32)
Calcium: 8.9 mg/dL (ref 8.4–10.5)
Chloride: 92 mEq/L — ABNORMAL LOW (ref 96–112)
Creatinine, Ser: 0.66 mg/dL (ref 0.50–1.10)
GFR calc Af Amer: >90 mL/min (ref >90)
GFR calc non Af Amer: >90 mL/min (ref >90)
GLUCOSE: 113 mg/dL — AB (ref 70–99)
POTASSIUM: 5 meq/L (ref 3.7–5.3)
Sodium: 136 mEq/L — ABNORMAL LOW (ref 137–147)

## 2014-04-05 LAB — MAGNESIUM
Magnesium: 1.7 mg/dL (ref 1.5–2.5)
Magnesium: 1.8 mg/dL (ref 1.5–2.5)
Magnesium: 1.9 mg/dL (ref 1.5–2.5)

## 2014-04-05 LAB — CBC
HCT: 37 % (ref 36.0–46.0)
HEMOGLOBIN: 11 g/dL — AB (ref 12.0–15.0)
MCH: 23.3 pg — AB (ref 26.0–34.0)
MCHC: 29.7 g/dL — AB (ref 30.0–36.0)
MCV: 78.2 fL (ref 78.0–100.0)
Platelets: 298 10*3/uL (ref 150–400)
RBC: 4.73 MIL/uL (ref 3.87–5.11)
RDW: 21.8 % — ABNORMAL HIGH (ref 11.5–15.5)
WBC: 12.4 10*3/uL — ABNORMAL HIGH (ref 4.0–10.5)

## 2014-04-05 LAB — PHOSPHORUS
PHOSPHORUS: 4.3 mg/dL (ref 2.3–4.6)
Phosphorus: 4.2 mg/dL (ref 2.3–4.6)
Phosphorus: 3.2 mg/dL (ref 2.3–4.6)

## 2014-04-05 LAB — GLUCOSE, CAPILLARY
GLUCOSE-CAPILLARY: 112 mg/dL — AB (ref 70–99)
Glucose-Capillary: 106 mg/dL — ABNORMAL HIGH (ref 70–99)
Glucose-Capillary: 111 mg/dL — ABNORMAL HIGH (ref 70–99)
Glucose-Capillary: 113 mg/dL — ABNORMAL HIGH (ref 70–99)
Glucose-Capillary: 127 mg/dL — ABNORMAL HIGH (ref 70–99)
Glucose-Capillary: 90 mg/dL (ref 70–99)

## 2014-04-05 LAB — PROTIME-INR
INR: 1.08 (ref 0.00–1.49)
PROTHROMBIN TIME: 13.8 s (ref 11.6–15.2)

## 2014-04-05 LAB — LEGIONELLA ANTIGEN, URINE: Legionella Antigen, Urine: NEGATIVE

## 2014-04-05 LAB — APTT: APTT: 28 s (ref 24–37)

## 2014-04-05 MED ORDER — MIDAZOLAM HCL 2 MG/2ML IJ SOLN: 4.0000 mg | Freq: Once | INTRAMUSCULAR | Status: DC

## 2014-04-05 MED ORDER — VITAL HIGH PROTEIN PO LIQD
1000.0000 mL | ORAL | Status: DC
  Filled 2014-04-05 (×3): qty 1000

## 2014-04-05 MED ORDER — VALPROIC ACID 250 MG/5ML PO SYRP
500.0000 mg | ORAL_SOLUTION | Freq: Three times a day (TID) | ORAL | Status: DC
  Administered 2014-04-05 – 2014-04-14 (×26): 500 mg via ORAL
  Filled 2014-04-05 (×32): qty 10

## 2014-04-05 MED ORDER — ETOMIDATE 2 MG/ML IV SOLN
40.0000 mg | Freq: Once | INTRAVENOUS | Status: DC
  Filled 2014-04-05: qty 20

## 2014-04-05 MED ORDER — METOPROLOL TARTRATE 1 MG/ML IV SOLN
INTRAVENOUS | Status: AC
  Filled 2014-04-05: qty 5

## 2014-04-05 MED ORDER — SODIUM CHLORIDE 0.9 % IV SOLN
INTRAVENOUS | Status: DC
  Administered 2014-04-05: 50 mL/h via INTRAVENOUS

## 2014-04-05 MED ORDER — NOREPINEPHRINE BITARTRATE 1 MG/ML IJ SOLN
2.0000 ug/min | INTRAVENOUS | Status: DC
  Filled 2014-04-05: qty 4

## 2014-04-05 MED ORDER — MAGNESIUM SULFATE 40 MG/ML IJ SOLN
2.0000 g | Freq: Once | INTRAMUSCULAR | Status: AC
  Administered 2014-04-05: 2 g via INTRAVENOUS
  Filled 2014-04-05: qty 50

## 2014-04-05 MED ORDER — DIGOXIN 250 MCG PO TABS
0.5000 mg | ORAL_TABLET | Freq: Once | ORAL | Status: AC
  Administered 2014-04-05: 0.5 mg via ORAL
  Filled 2014-04-05: qty 2

## 2014-04-05 MED ORDER — VITAL HIGH PROTEIN PO LIQD
1000.0000 mL | ORAL | Status: DC
  Administered 2014-04-05: 1000 mL
  Filled 2014-04-05 (×2): qty 1000

## 2014-04-05 MED ORDER — METOPROLOL TARTRATE 1 MG/ML IV SOLN
5.0000 mg | Freq: Once | INTRAVENOUS | Status: AC
  Administered 2014-04-05: 5 mg via INTRAVENOUS

## 2014-04-05 MED ORDER — VECURONIUM BROMIDE 10 MG IV SOLR: 10.0000 mg | Freq: Once | INTRAVENOUS | Status: DC

## 2014-04-05 MED ORDER — PRO-STAT SUGAR FREE PO LIQD
30.0000 mL | Freq: Two times a day (BID) | ORAL | Status: DC
  Administered 2014-04-05: 30 mL
  Filled 2014-04-05 (×2): qty 30

## 2014-04-05 MED ORDER — PROPOFOL 10 MG/ML IV EMUL: 5.0000 ug/kg/min | Freq: Once | INTRAVENOUS | Status: DC

## 2014-04-05 MED ORDER — FENTANYL CITRATE 0.05 MG/ML IJ SOLN: 200.0000 ug | Freq: Once | INTRAMUSCULAR | Status: DC

## 2014-04-05 NOTE — Progress Notes (Signed)
[place] Progress Note Patient Name: Colleen [name] DOB: 04/13/1951 MRN: [id number]  Date of Service  04/05/2014   HPI/Events of Note   A fib with RVR and BP elevated.  eICU Interventions  Will give lopressor 5 mg IV x one.   Intervention Category Major Interventions: Arrhythmia - evaluation and management  Thomasine Klutts 04/05/2014, 6:38 AM

## 2014-04-05 NOTE — Progress Notes (Signed)
INITIAL NUTRITION ASSESSMENT  DOCUMENTATION CODES Per approved criteria  -Severe malnutrition in the context of chronic illness   INTERVENTION: 1.  Enteral nutrition; Day 1 of TF protocol start feed at 25 mL/hr for the remainder of the day.   Day 2 of TF protocol at 0600 start new goal rate of 1320 mL (55 mL/hr) run from 0600-05:59.   Maintain at rate ordered unless TF's are held. If TF's are held, calculate the new volume to be provided and adjust the rate to provide total volume ordered within 24 hours (total volume-volume fed, divide this by remaining hours in the 24 hr period to get new goal rate). Max rate of 150 mL/hr. Each day at 0600 return to ordered rate.  TF to provide 1584 kcal, 99g protein, and 1063mL free water.   NUTRITION DIAGNOSIS: Inadequate oral intake related to inability to eat as evidenced by NPO status.   Monitor:  1.  Enteral nutrition; initiation with tolerance.  Pt to meet >/=90% estimated needs with nutrition support.  2.  Wt/wt change; monitor trends 3.  Labs; monitor trends.  Reason for Assessment: Consult for TF Initiation/Management  63 y.o. female  Admitting Dx: hemorrhagic conversion of an ischemic infarct  ASSESSMENT: PMHx significant for recent CVA, GERD, IBS, HTN, diverticulosis. Admitted with worsening L sided weakness. Work-up reveals hemorrhagic conversion of an ischemic infarct.  Pt with decline in medical status; transferred from rehab to acute.  Pt came to rehab on Dysphagia 1, pudding thick diet, however was too lethargic and not appropriate for POs.  She was made NPO by SLP.   RD notes family decision to pursue trach, PEG, and SNF if necessary.   Pt now intubated likely related to aspiration pneumonia.   Patient is currently intubated on ventilator support MV: 9.3 L/min Temp (24hrs), Avg:100 F (37.8 C), Min:98.6 F (37 C), Max:101.1 F (38.4 C)  Propofol: none  Nutrition Focused Physical Exam: Subcutaneous Fat:  Orbital  Region: WNL Upper Arm Region: moderate depletion Thoracic and Lumbar Region: n/a  Muscle:  Temple Region: moderate depletion Clavicle Bone Region: moderate depletion Clavicle and Acromion Bone Region: WNL Scapular Bone Region: mild depletion Dorsal Hand: mild depletion Patellar Region: moderate depletion Anterior Thigh Region: moderate depletion Posterior Calf Region: moderate depletion  Edema: none  Pt meets criteria for severe MALNUTRITION in the context of chronic illness as evidenced by moderate muscle mass loss and 15% wt loss in <3 months.   Height: Ht Readings from Last 1 Encounters:  04/04/14 5\' 5"  (1.651 m)    Weight: Wt Readings from Last 1 Encounters:  04/04/14 139 lb 8.8 oz (63.3 kg)    Ideal Body Weight: 125 lb  % Ideal Body Weight: 118%  Wt Readings from Last 10 Encounters:  04/04/14 139 lb 8.8 oz (63.3 kg)  04/03/14 153 lb 7 oz (69.6 kg)  03/29/14 149 lb 0.5 oz (67.6 kg)  02/19/14 194 lb 7.1 oz (88.2 kg)  01/25/14 179 lb (81.194 kg)  01/17/14 177 lb (80.287 kg)  01/06/14 175 lb 0.7 oz (79.4 kg)  01/06/14 175 lb 0.7 oz (79.4 kg)  01/04/14 179 lb (81.194 kg)  12/08/13 184 lb 15.5 oz (83.9 kg)    Usual Body Weight: 175 lb  % Usual Body Weight: 85%  BMI:  Body mass index is 23.22 kg/(m^2). WNL  Estimated Nutritional Needs: Kcal: 1630 Protein: 86-100g Fluid: >1.8 L/day  Skin: intact  Diet Order: NPO  EDUCATION NEEDS: -No education needs identified at this time  Intake/Output Summary (Last 24 hours) at 04/05/14 1022 Last data filed at 04/05/14 0700  Gross per 24 hour  Intake    630 ml  Output    575 ml  Net     55 ml    Last BM: 4/5  Labs:   Recent Labs Lab 03/30/14 0602 04/04/14 0833 04/05/14 0420  NA 139 138 136*  K 3.8 5.2 5.0  CL 93* 91* 92*  CO2 32 33* 32  BUN 11 17 24*  CREATININE 0.40* 0.53 0.66  CALCIUM 9.2 9.5 8.9  MG  --   --  1.7  PHOS  --   --  4.2  GLUCOSE 126* 105* 113*    CBG (last 3)   Recent  Labs  04/05/14 0037 04/05/14 0344 04/05/14 0744  GLUCAP 113* 111* 112*   Lab Results  Component Value Date   HGBA1C 6.1* 03/19/2014    Scheduled Meds: . antiseptic oral rinse  15 mL Mouth Rinse q12n4p  . ceFEPime (MAXIPIME) IV  1 g Intravenous Q8H  . chlorhexidine  15 mL Mouth Rinse BID  . digoxin  0.125 mg Oral Daily  . digoxin  0.5 mg Oral Once  . diltiazem  90 mg Oral TID PC & HS  . etomidate  40 mg Intravenous Once  . famotidine (PEPCID) IV  20 mg Intravenous BID  . feeding supplement (PRO-STAT SUGAR FREE 64)  30 mL Per Tube BID  . feeding supplement (VITAL HIGH PROTEIN)  1,000 mL Per Tube Q24H  . fentaNYL  200 mcg Intravenous Once  . insulin aspart  0-9 Units Subcutaneous 6 times per day  . magnesium sulfate 1 - 4 g bolus IVPB  2 g Intravenous Once  . methimazole  5 mg Oral Daily  . metoprolol      . metoprolol tartrate  75 mg Oral BID  . midazolam  4 mg Intravenous Once  . propofol  5-70 mcg/kg/min Intravenous Once  . Valproic Acid  500 mg Oral TID  . vancomycin  500 mg Intravenous Q12H  . vecuronium  10 mg Intravenous Once    Continuous Infusions: . sodium chloride 50 mL/hr (04/05/14 0955)  . labetalol (NORMODYNE) infusion      Past Medical History  Diagnosis Date  . Hypertension   . Hiatal hernia   . Adenomatous polyp of colon   . Acute asthmatic bronchitis   . GERD (gastroesophageal reflux disease)     EGD neg 03/07/2008, but prev required dilation  . COPD (chronic obstructive pulmonary disease)   . IBS (irritable bowel syndrome)     chronic  . Urinary incontinence   . Esophagitis   . Chronic gastritis   . Duodenitis without hemorrhage 10/01/2001  . Diverticulosis   . Personal history of colonic adenomas 02/05/2008        . Anemia   . Hyperlipidemia   . Decreased peripheral vision of right eye   . Pneumonia 2011  . Hyperthyroidism   . Type II diabetes mellitus   . Stroke     a. Hx of stroke and ministrokes ~ 2009. b. TIA 11/2013, dx with AF at that  time.  . Arthritis   . Depression   . On home oxygen therapy     "2L; suppose to use 24/7" (01/05/2014)  . PAF (paroxysmal atrial fibrillation)     a. Prior hx. b. Recurred 11/2013 in setting of possible TIA. Lifelong Coumadin planned. Spont converted to NSR. EF 60-65% by echo 11/2013, normal cors  2007, normal nuc 2011.   . Atrial flutter     a. Dx 12/2013. s/p DCCV. On anticoagulation.    Past Surgical History  Procedure Laterality Date  . Colonoscopy    . Upper gi endoscopy    . Cholecystectomy  1980's  . Vaginal hysterectomy  1992  . Breast biopsy Left 1987    "milk gland"  . Cardioversion N/A 01/06/2014    Procedure: CARDIOVERSION;  Surgeon: Lelon Perla, MD;  Location: [facility];  Service: Cardiovascular;  Laterality: N/A;    Brynda Greathouse, MS RD LDN Clinical Inpatient Dietitian Pager: [phone] Weekend/After hours pager: [phone]

## 2014-04-05 NOTE — Progress Notes (Signed)
EEG completed; results pending.    

## 2014-04-05 NOTE — Procedures (Signed)
ELECTROENCEPHALOGRAM REPORT  Patient: Colleen [name]       Room #: 7W29 EEG No. ID: [id number] Age: 63 y.o.        Sex: female Referring Physician: Nelda Marseille Report Date:  04/05/2014        Interpreting Physician: Anthony Sar  History: [name] is an 62 y.o. female and Mr. Diabetes mellitus, COPD, hypertension and atrial fibrillation with right temporal and occipital hemorrhagic infarction with mass affect. Patient is currently in a state of coma, intubated and on mechanical ventilation.  Indications for study:  Assess severity of encephalopathy; rule out seizure activity.  Technique: This is an 18 channel routine scalp EEG performed at the bedside with bipolar and monopolar montages arranged in accordance to the international 10/20 system of electrode placement.   Description: the predominant background activity consisted of low to moderate amplitude 1-2 Hz diffuse delta activity with superimposed 5-6 Hz theta activity of moderate amplitude, which at times was was continuous, and at other times showed a burst-suppression type character diffusely. Amplitude cerebral activity recorded from the right temporal region was noticeably lower compared to other cerebral areas. Photic stimulation was not performed. No epileptiform discharges were recorded.   Interpretation: this EEG is abnormal with moderately severe generalized slowing of cerebral activity consistent with severe encephalopathic state. No evidence of epileptic activity was seen. Low amplitude cerebral artery territory cortical from the left temporal region is consistent with history of hemorrhagic infarction involving that region of the brain.   Rush Farmer M.D. [facility] [phone]

## 2014-04-05 NOTE — Progress Notes (Signed)
Pt placed back on rest due to pt WOB and RR increased. RT will monitor.

## 2014-04-05 NOTE — Progress Notes (Signed)
PT Cancellation Note  Patient Details Name: LUCILLIA CORSON MRN: [id number] DOB: 04-23-1951   Cancelled Treatment:    Reason Eval/Treat Not Completed: Medical issues which prohibited therapy.  Just recently intubated. 04/05/2014  Donnella Sham, [place] [phone]  (pager)   Daurice Ovando, Tessie Fass 04/05/2014, 3:27 PM

## 2014-04-05 NOTE — Progress Notes (Addendum)
I have had extensive discussions with family husband. We discussed patients current circumstances and organ failures. We also discussed patient's prior wishes under circumstances such as this. Family has decided to NOT perform resuscitation if arrest but to continue current medical support for now. Want trach, peg and SNF support. Total ccm time now 85 min    Colleen [name]. Titus Mould, MD, [place] Pgr: [facility]

## 2014-04-05 NOTE — Progress Notes (Addendum)
PULMONARY / CRITICAL CARE MEDICINE   Name: Colleen [name] MRN: [id number] DOB: [birth year]    ADMISSION DATE:  04/04/2014 CONSULTATION DATE:  4/6 BRIEF PATIENT DESCRIPTION:   Colleen [name] is a 63 y.o. Caucasian female with history of COPD, type 2 diabetes, hypertension, atrial fibrillation, and recent right occipital infarct.   She was on Coumadin which was discontinued due to petechial hemorrhage as shown on CT-head on 03/16/14. The patient was recently admitted to ICU and treated for HCAP. Patient was placed on a feeding tube for nutrition on 03/28/2012. She was subsequently discharged to inpatient rehabilitation. She has continued Zosy Patient has also been on valproate which is therapeutic per valproic acid level test on 04/01/14. The patient developed low-grade fevers which has been progressively worsening. She had a fever of 103 and subsequently became tachypnea. Patient was noticed to have copious amount of secretions. The patient was admitted to [facility]. She is comatose status and can not protect her airway and therefore transferred to ICU.  SIGNIFICANT EVENTS / STUDIES:   4/3- Stable hematoma in the right temporal and occipital lobes with subfalcine herniation. Stable lateral ventricular size with trace hemorrhage in the left   lateral ventricle.  4/6-CXR, moderate right-sided pleural effusion, with associated airspace opacity. 4/6 CT-head: No significant change in her large intracerebral hematoma involving the right temporal and occipital lobes. Mild right to left midline shift, unchanged.   LINES / TUBES: 4/6-ETT>> 4/6-CVL>>  CULTURES: 4/6- respiratory culture>> 4/6- blood culture>> 4/6- Urine culture>>  ANTIBIOTICS: 3/26- Zosyn>>4/5 4/6-Vancomycin>> 4/6-Cefpeme>>   SUBJECTIVE:  4/7: Patient is intubated and sedated. Fever 101  VITAL SIGNS: Temp:  [98.5 F (36.9 C)-101.1 F (38.4 C)] 101 F (38.3 C) (04/07 0645) Pulse Rate:  [33-117] 117 (04/07 0700) Resp:  [18-29] 20  (04/07 0700) BP: (84-174)/(61-105) 146/99 mmHg (04/07 0700) SpO2:  [96 %-100 %] 98 % (04/07 0700) FiO2 (%):  [40 %-100 %] 40 % (04/07 0400) HEMODYNAMICS:   VENTILATOR SETTINGS: Vent Mode:  [-] PRVC FiO2 (%):  [40 %-100 %] 40 % Set Rate:  [18 bmp] 18 bmp Vt Set:  [450 mL] 450 mL PEEP:  [5 cmH20] 5 cmH20 Plateau Pressure:  [14 cmH20-19 cmH20] 19 cmH20 INTAKE / OUTPUT: Intake/Output     04/06 0701 - 04/07 0700 04/07 0701 - 04/08 0700   Other 100    NG/GT 160    IV Piggyback 400    Total Intake(mL/kg) 660 (10.4)    Urine (mL/kg/hr) 575 (0.4)    Total Output 575     Net +85          Urine Occurrence 2 x      PHYSICAL EXAMINATION:  General: the patient is intubated and sedated.  Neuro: unresponsive  HEENT: L pupil is slightly larger than the R, But both reactive to the light  Cardiovascular: irregularly irregular rhythm  Lungs: diffused rales bilaterally  Abdomen: soft, distended. Bowel sound present  Musculoskeletal: moves toes to pain stimulus on both sidwe  Skin: no rashes.  LABS:  CBC  Recent Labs Lab 03/31/14 0748 04/04/14 0833 04/05/14 0420  WBC 14.2* 14.3* 12.4*  HGB 13.0 12.3 11.0*  HCT 43.7 41.1 37.0  PLT 263 291 298   Coag's No results found for this basename: APTT, INR,  in the last 168 hours BMET  Recent Labs Lab 03/30/14 0602 04/04/14 0833 04/05/14 0420  NA 139 138 136*  K 3.8 5.2 5.0  CL 93* 91* 92*  CO2 32 33* 32  BUN 11 17 24*  CREATININE 0.40* 0.53 0.66  GLUCOSE 126* 105* 113*   Electrolytes  Recent Labs Lab 03/30/14 0602 04/04/14 0833 04/05/14 0420  CALCIUM 9.2 9.5 8.9  MG  --   --  1.7  PHOS  --   --  4.2   Sepsis Markers No results found for this basename: LATICACIDVEN, PROCALCITON, O2SATVEN,  in the last 168 hours ABG  Recent Labs Lab 04/04/14 0505 04/04/14 1814 04/05/14 0326  PHART 7.405 7.456* 7.448  PCO2ART 62.8* 56.8* 50.4*  PO2ART 82.7 382.0* 96.9   Liver Enzymes  Recent Labs Lab 03/30/14 0602  04/04/14 0833  AST 29 28  ALT 17 10  ALKPHOS 77 58  BILITOT 0.6 0.4  ALBUMIN 2.7* 2.5*   Cardiac Enzymes No results found for this basename: TROPONINI, PROBNP,  in the last 168 hours Glucose  Recent Labs Lab 04/04/14 0911 04/04/14 1307 04/04/14 1601 04/04/14 1849 04/05/14 0037 04/05/14 0344  GLUCAP 102* 102* 92 112* 113* 111*    Imaging Dg Chest 2 View  04/03/2014   CLINICAL DATA:  Unresponsive  EXAM: CHEST  2 VIEW  COMPARISON:  03/30/2014  FINDINGS: Cardiomegaly again noted. There is moderate size right pleural effusion with right basilar atelectasis or infiltrate. Left lung is clear. No pulmonary edema. NG tube in place.  IMPRESSION: Moderate size right pleural effusion with right basilar atelectasis or infiltrate. No pulmonary edema.   Electronically Signed   By: Lahoma Crocker M.D.   On: 04/03/2014 14:40   Ct Head Wo Contrast  04/04/2014   CLINICAL DATA:  Follow up intracranial hemorrhage, no new symptoms  EXAM: CT HEAD WITHOUT CONTRAST  TECHNIQUE: Contiguous axial images were obtained from the base of the skull through the vertex without intravenous contrast.  COMPARISON:  04/01/2014  FINDINGS: Again identified large intraparenchymal hemorrhage within the right temporal lobe extending into right occipital lobe, 6.3 x 3.1 cm in size essentially unchanged from previous study.  Significant surrounding vasogenic edema and effacement of sulci.  Mass effect within right hemisphere with right-to-left midline shift approximately 6 mm, stable.  Minimal dilatation of left lateral ventricle.  Small amount of intraventricular hemorrhage at occipital horn of left lateral ventricle again seen.  No new areas of hemorrhage or infarction.  No extra-axial fluid collections.  Fluid within right mastoid air cells again noted.  Small amount of fluid and mucosal thickening within right maxillary sinus.  Atherosclerotic calcifications at the carotid siphons.  IMPRESSION: No significant change in size/appearance  of large intracerebral hematoma involving the right temporal and occipital lobes with associated surrounding edema and mass effect within the right hemisphere.  Persistent mild right to left midline shift, unchanged.   Electronically Signed   By: Lavonia Dana M.D.   On: 04/04/2014 18:02   Dg Chest Port 1 View  04/04/2014   CLINICAL DATA:  Endotracheal tube and central line placement  EXAM: PORTABLE CHEST - 1 VIEW  COMPARISON:  Portable exam 1805 hr compared to 0510 hr  FINDINGS: Tip of endotracheal tube projects 6.2 cm above carinal.  Right jugular central venous catheter tip projects over mid SVC.  Nasogastric tube extends into stomach.  Normal heart size, mediastinal contours and pulmonary vascularity.  Probable small layered right pleural effusion.  Remaining lungs clear.  No pneumothorax or acute osseous findings.  IMPRESSION: Line and tube positions as above.  Probable small right pleural effusion.   Electronically Signed   By: Lavonia Dana M.D.   On: 04/04/2014 18:19  Dg Chest Port 1 View  04/04/2014   CLINICAL DATA:  Respiratory distress.  EXAM: PORTABLE CHEST - 1 VIEW  COMPARISON:  Chest radiograph performed 04/03/2014  FINDINGS: The patient's enteric tube is noted extending below the diaphragm, with the side port at the gastroesophageal junction.  There is an increasing moderate right-sided pleural effusion, with associated airspace opacity. The left lung appears clear. No pneumothorax is seen.  The cardiomediastinal silhouette is normal in size. No acute osseous abnormalities are identified.  IMPRESSION: Apparent increasing moderate right-sided pleural effusion, with associated airspace opacity.   Electronically Signed   By: Garald Balding M.D.   On: 04/04/2014 05:49   Dg Abd Portable 1v  04/04/2014   CLINICAL DATA:  Orogastric tube placement  EXAM: PORTABLE ABDOMEN - 1 VIEW  COMPARISON:  Portable exam 1801 hr compared to 03/31/2014  FINDINGS: Tip of orogastric tube projects over pylorus/duodenal bulb  region.  Normal colonic gas and stool.  Few nonspecific mildly prominent loops of air-filled small bowel in mid abdomen.  No bowel wall thickening or evidence of obstruction.  IMPRESSION: Tip of orogastric tube projects over pylorus/duodenal bulb region.   Electronically Signed   By: Lavonia Dana M.D.   On: 04/04/2014 18:16    ASSESSMENT / PLAN: PULMONARY A: mostly likely aspiration PNA, ARF P:   -keep plat less 30 -ABG reviewed, reduce rate 12 -SBT attempt, cpap5 ps 5-10 -will meet with family to discuss options -continue abx  CARDIOVASCULAR A:   - A Fib, Hr controlled, rvr this am 2D echo on 3/21/15s showed EF 55 to 60% without wall motion abnormality.  -  P:  - Digoxin 0.125 mg daily, 0.8 level, consider bolus dig 0.5 mg x 1 as had rvr this am  - Metoprolol 75 mg bid - Diltiazem 90 mg tid - SCD for DVT ppx -MAp goals 70-95 if able -cvp  RENAL A:  Mild hypochloriemia -brain edema -mild hypomagnesemia P:   -no free water planned - monitor BMP - check Mg and Phosphorus -goal 15 cc/hr urine, met -cvp -no role 3% -add saline at 50  GASTROINTESTINAL A: No acute issue P:    - IV pepcid -add feeds  HEMATOLOGIC A:  Leukocytosis secondary to aspiration PNA, ICH P:  -Monitor cbc in am  -repeat coags -scd for now -consider sub q hep after d.w neuro  INFECTIOUS A: aspiration PNA P:    -Vancomycin and Cefepeme -low threshold addition flagyl  ENDOCRINE A:  DM-II, A1c 6.1 on 03/19/14. CBG 113 in AM P:   -SSI-senstive  NEUROLOGIC A:   Hx of ischemic stroke. Now acute hemorrhagic conversion of ischemic CVA. Airway compromise -CT-head 4/7: No significant change in her large intracerebral hematoma involving the right temporal and occipital lobes. Mild right to left midline shift, unchanged.  P:   - No anticoagulant -to meet with family now -exam more suppressed than anticipated, may need MRI -restart valproic, assess eeg  Ivor Costa, MD PGY3, Internal Medicine  Teaching Service Pager: [phone]  TODAY'S SUMMARY: meet with family, reduce rate vent, wean, ABX continue, start T F  I have personally obtained a history, examined the patient, evaluated laboratory and imaging results, formulated the assessment and plan and placed orders. CRITICAL CARE: The patient is critically ill with multiple organ systems failure and requires high complexity decision making for assessment and support, frequent evaluation and titration of therapies, application of advanced monitoring technologies and extensive interpretation of multiple databases. Critical Care Time devoted to patient care  services described in this note is 35 minutes.   Lavon Paganini. Titus Mould, MD, [place] Pgr: [facility]  Pulmonary and [place] Pager: [phone]  04/05/2014, 7:22 AM

## 2014-04-05 NOTE — Progress Notes (Signed)
Trach scheduled for 1:30 tomorrow afternoon per Dr. Titus Mould

## 2014-04-06 ENCOUNTER — Inpatient Hospital Stay (HOSPITAL_COMMUNITY): Payer: Medicaid Other

## 2014-04-06 ENCOUNTER — Inpatient Hospital Stay (HOSPITAL_COMMUNITY): Admit: 2014-04-06 | Payer: Medicaid Other

## 2014-04-06 ENCOUNTER — Encounter (HOSPITAL_COMMUNITY): Payer: Medicaid Other

## 2014-04-06 ENCOUNTER — Encounter (HOSPITAL_COMMUNITY): Payer: Self-pay | Admitting: Internal Medicine

## 2014-04-06 LAB — PROTIME-INR
INR: 1.1 (ref 0.00–1.49)
Prothrombin Time: 14 seconds (ref 11.6–15.2)

## 2014-04-06 LAB — CBC
HCT: 35.4 % — ABNORMAL LOW (ref 36.0–46.0)
Hemoglobin: 10.4 g/dL — ABNORMAL LOW (ref 12.0–15.0)
MCH: 23.2 pg — ABNORMAL LOW (ref 26.0–34.0)
MCHC: 29.4 g/dL — ABNORMAL LOW (ref 30.0–36.0)
MCV: 79 fL (ref 78.0–100.0)
Platelets: 286 10*3/uL (ref 150–400)
RBC: 4.48 MIL/uL (ref 3.87–5.11)
RDW: 21.4 % — ABNORMAL HIGH (ref 11.5–15.5)
WBC: 13.2 10*3/uL — ABNORMAL HIGH (ref 4.0–10.5)

## 2014-04-06 LAB — BASIC METABOLIC PANEL
BUN: 21 mg/dL (ref 6–23)
CALCIUM: 8.5 mg/dL (ref 8.4–10.5)
CO2: 30 meq/L (ref 19–32)
Chloride: 97 mEq/L (ref 96–112)
Creatinine, Ser: 0.47 mg/dL — ABNORMAL LOW (ref 0.50–1.10)
GFR calc Af Amer: >90 mL/min (ref >90)
GFR calc non Af Amer: >90 mL/min (ref >90)
Glucose, Bld: 104 mg/dL — ABNORMAL HIGH (ref 70–99)
POTASSIUM: 3.8 meq/L (ref 3.7–5.3)
SODIUM: 139 meq/L (ref 137–147)

## 2014-04-06 LAB — GLUCOSE, CAPILLARY
GLUCOSE-CAPILLARY: 108 mg/dL — AB (ref 70–99)
GLUCOSE-CAPILLARY: 120 mg/dL — AB (ref 70–99)
Glucose-Capillary: 100 mg/dL — ABNORMAL HIGH (ref 70–99)
Glucose-Capillary: 106 mg/dL — ABNORMAL HIGH (ref 70–99)
Glucose-Capillary: 113 mg/dL — ABNORMAL HIGH (ref 70–99)
Glucose-Capillary: 97 mg/dL (ref 70–99)

## 2014-04-06 LAB — CULTURE, RESPIRATORY W GRAM STAIN

## 2014-04-06 LAB — URINE CULTURE
Colony Count: NO GROWTH
Culture: NO GROWTH

## 2014-04-06 LAB — APTT: APTT: 25 s (ref 24–37)

## 2014-04-06 LAB — PHOSPHORUS
PHOSPHORUS: 3.3 mg/dL (ref 2.3–4.6)
Phosphorus: 3.1 mg/dL (ref 2.3–4.6)

## 2014-04-06 LAB — CULTURE, RESPIRATORY

## 2014-04-06 LAB — MAGNESIUM
MAGNESIUM: 1.8 mg/dL (ref 1.5–2.5)
Magnesium: 1.6 mg/dL (ref 1.5–2.5)

## 2014-04-06 MED ORDER — PROPOFOL 10 MG/ML IV EMUL
5.0000 ug/kg/min | Freq: Once | INTRAVENOUS | Status: AC
  Administered 2014-04-06: 15 ug/kg/min via INTRAVENOUS
  Filled 2014-04-06: qty 100

## 2014-04-06 MED ORDER — DILTIAZEM 12 MG/ML ORAL SUSPENSION
90.0000 mg | Freq: Three times a day (TID) | ORAL | Status: DC
  Administered 2014-04-06 – 2014-04-14 (×30): 90 mg via ORAL
  Filled 2014-04-06 (×37): qty 9

## 2014-04-06 MED ORDER — PROPOFOL 10 MG/ML IV EMUL
5.0000 ug/kg/min | INTRAVENOUS | Status: DC
  Administered 2014-04-06: 5 ug/kg/min via INTRAVENOUS

## 2014-04-06 MED ORDER — ETOMIDATE 2 MG/ML IV SOLN
40.0000 mg | Freq: Once | INTRAVENOUS | Status: DC
  Filled 2014-04-06: qty 20

## 2014-04-06 MED ORDER — DEXTROSE 5 % IV SOLN
1.0000 g | INTRAVENOUS | Status: DC
  Administered 2014-04-06 – 2014-04-08 (×3): 1 g via INTRAVENOUS
  Filled 2014-04-06 (×4): qty 10

## 2014-04-06 MED ORDER — VITAL AF 1.2 CAL PO LIQD
1000.0000 mL | ORAL | Status: DC
  Administered 2014-04-06 – 2014-04-08 (×4): 1000 mL
  Filled 2014-04-06 (×7): qty 1000

## 2014-04-06 MED ORDER — FENTANYL CITRATE 0.05 MG/ML IJ SOLN
200.0000 ug | Freq: Once | INTRAMUSCULAR | Status: AC
  Administered 2014-04-06: 200 ug via INTRAVENOUS
  Filled 2014-04-06: qty 4

## 2014-04-06 MED ORDER — METOPROLOL TARTRATE 50 MG PO TABS
75.0000 mg | ORAL_TABLET | Freq: Two times a day (BID) | ORAL | Status: DC
  Administered 2014-04-06 – 2014-04-11 (×10): 75 mg via ORAL
  Filled 2014-04-06 (×14): qty 1

## 2014-04-06 MED ORDER — FENTANYL CITRATE 0.05 MG/ML IJ SOLN: 25.0000 ug | INTRAMUSCULAR | Status: DC | PRN

## 2014-04-06 MED ORDER — MIDAZOLAM HCL 2 MG/2ML IJ SOLN
4.0000 mg | Freq: Once | INTRAMUSCULAR | Status: AC
  Administered 2014-04-06: 2 mg via INTRAVENOUS
  Filled 2014-04-06: qty 4

## 2014-04-06 MED ORDER — VECURONIUM BROMIDE 10 MG IV SOLR
10.0000 mg | Freq: Once | INTRAVENOUS | Status: AC
  Administered 2014-04-06: 6 mg via INTRAVENOUS

## 2014-04-06 NOTE — Progress Notes (Signed)
No SBT/wean due to scheduled trach this morning. No complications noted. RT will monitor.

## 2014-04-06 NOTE — Procedures (Signed)
Bronchoscopy Procedure Note Colleen [name] [id number] 1951-11-05  Procedure: Bronchoscopy Indications: Tracheostomy   Procedure Details Consent: Risks of procedure as well as the alternatives and risks of each were explained to the (patient/caregiver).  Consent for procedure obtained. Time Out: Verified patient identification, verified procedure, site/side was marked, verified correct patient position, special equipment/implants available, medications/allergies/relevent history reviewed, required imaging and test results available.  Performed  In preparation for procedure, patient was given 100% FiO2 and bronchoscope lubricated. Sedation: Benzodiazepines, Muscle relaxants and Etomidate  ETT entered, carina visualized for airway protection and tracheostomy placement, then airway entered via new trach, carina visualized.  Bronchoscope removed.    Evaluation Hemodynamic Status: Transient hypotension treated with fluid; O2 sats: stable throughout Patient's Current Condition: stable Specimens:  None Complications: No apparent complications Patient did tolerate procedure well.   Marijean Heath, NP 04/06/2014

## 2014-04-06 NOTE — Discharge Summary (Signed)
NAMEJODY, Colleen [name]                  ACCOUNT NO.:  [id number]  MEDICAL RECORD NO.:  [id number]  LOCATION:  [id number]                        FACILITY:  [facility]  PHYSICIAN:  Charlett Blake, M.D.DATE OF BIRTH:  30-May-1951  DATE OF ADMISSION:  03/29/2014 DATE OF DISCHARGE:  04/04/2014                              DISCHARGE SUMMARY   DISCHARGE DIAGNOSES: 1. 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 2. Sequential compression devices for deep venous thrombosis     prophylaxis. 3. Acute respiratory failure. 4. Anxiety. 5. Dysphagia. 6. Atrial fibrillation. 7. Seizure disorder. 8. Healthcare-associated pneumonia with chronic obstructive pulmonary     disease. 9. Type 2 diabetes mellitus.  HISTORY OF PRESENT ILLNESS:  This is a 63 year old right-handed female, history of COPD, type 2 diabetes mellitus, atrial fibrillation on Coumadin therapy with recent right occipital infarction, Wednesday on March 16, 2014, seen at [facility] following a CT that showed petechial hemorrhage.  She was discharged to home after stopping her Coumadin.  She presented to [facility] on March 18, 2014, after developing severe headache with nausea, vomiting, and left-sided weakness.  CT and imaging showed right temporal occipital region infarct with intraventricular extension into the right lateral ventricle with cerebral edema and left midline shift.  She was not a tPA candidate. Noted INR on admission of 2.61, was reversed.  Followup cranial CT scan on March 27, 2014, showed slight decrease in right intraparenchymal hematoma.  Plan was the followup CT of the head in a week, if hemorrhage continued to resolve, may consider anticoagulation.  Echocardiogram with ejection fraction of 60%.  No wall motion abnormalities.  Recent carotid Dopplers with no ICA stenosis.  EEG showed likely epileptic seizure, addition to moderate-to-severe generalized nonspecific  cerebral dysfunction.  Follow up Neurology Services maintained on valproate 500 mg every 8 hours.  HCAP maintained on empiric Zosyn.  A Panda feeding tube was in place for nutritional support, however patient pulled tube out, was maintained on dysphagia 1, honey-thick liquid diet.  Physical and occupational therapy ongoing.  The patient was admitted for comprehensive rehab program.  PAST MEDICAL HISTORY:  See discharge diagnoses.  SOCIAL HISTORY:  Lives with children and spouse.  FUNCTIONAL HISTORY:  Prior to admission, used oxygen at home prior to admission, moderate to max assist for basic bed mobility and transfers upon admission to [facility].  Max assist for activities of daily living.  PHYSICAL EXAMINATION:  VITAL SIGNS:  Blood pressure 158/72, pulse 81, temperature 98, respirations 27, oxygen saturation 99%. GENERAL:  This was a lethargic female, she was easily distracted.  Right gaze preference.  Inconsistently followed commands with limited attention.  She did grimace to deep palpation, showed slight spontaneous movement on the left. HEENT:  Pupils reactive to light without nystagmus. LUNGS:  Decreased breath sounds with poor inspiratory effort.  CARDIAC: Rate irregularly irregular. ABDOMEN:  Soft, nontender.  Good bowel sounds.  REHABILITATION HOSPITAL COURSE:  The patient was admitted to Inpatient Rehab Services with therapies initiated on a 3-hour daily basis consisting of physical therapy, occupational therapy, speech therapy, and rehabilitation nursing.  The following issues were addressed during patient's rehabilitation stay.  Pertaining to Mrs.  [name]'s ischemic right temporal occipital CVA with hemorrhagic conversion, she showed slow progressive gains, no anticoagulation at this time secondary to hemorrhagic conversion.  Latest cranial CT scan of the head on April 01, 2014, showed stable hematoma in right temporal occipital lobes.  A nasogastric tube has since  been placed for nutritional support as she was unable to maintain nutrition on a dysphagia 1 honey thick liquid diet.  Blood sugars monitored while with tube feeds.  She did have a history of COPD with home oxygen, monitored closely.  Blood pressures controlled with Altace.  The patient with as limited participation throughout her rehab stay noted on the morning of April 04, 2014, with increased respiratory distress.  Rapid response was consulted.  A chest x-ray completed showed no pneumothorax or acute findings,  probable small right pleural effusion.  Hospitalist team consulted in regard to patient's medical changes and remained guarded.  She was discharged to [facility] in guarded condition.  All issues in respect to her medical changes were discussed with family, follow up her Critical Care Medicine.  She was maintained on empiric antibiotics at the time of discharge from [facility].     Lauraine Rinne, P.A.   ______________________________ Charlett Blake, M.D.    DA/MEDQ  D:  04/06/2014  T:  04/06/2014  Job:  [id number]

## 2014-04-06 NOTE — Procedures (Signed)
Supervised and present through the entire procedure.  Desera Graffeo, MD Pulmonary and Critical Care Medicine [facility] Pager: [phone]  

## 2014-04-06 NOTE — Progress Notes (Signed)
PULMONARY / CRITICAL CARE MEDICINE   Name: Colleen [name] MRN: [id number] DOB: December 24, 1951    ADMISSION DATE:  04/04/2014 CONSULTATION DATE:  4/6 BRIEF PATIENT DESCRIPTION:   Colleen [name] is a 63 y.o. Caucasian female with history of COPD, type 2 diabetes, hypertension, atrial fibrillation, and recent right occipital infarct.   She was on Coumadin which was discontinued due to petechial hemorrhage as shown on CT-head on 03/16/14. The patient was recently admitted to ICU and treated for HCAP. Patient was placed on a feeding tube for nutrition on 03/28/2012. She was subsequently discharged to inpatient rehabilitation. She has continued Zosy Patient has also been on valproate which is therapeutic per valproic acid level test on 04/01/14. The patient developed low-grade fevers which has been progressively worsening. She had a fever of 103 and subsequently became tachypnea. Patient was noticed to have copious amount of secretions. The patient was admitted to [facility]. She is comatose status and can not protect her airway and therefore transferred to ICU.  SIGNIFICANT EVENTS / STUDIES:   4/3- Stable hematoma in the right temporal and occipital lobes with subfalcine herniation. Stable lateral ventricular size with trace hemorrhage in the left   lateral ventricle.  4/6-CXR, moderate right-sided pleural effusion, with associated airspace opacity. 4/6-CT-head: No significant change in her large intracerebral hematoma involving the right temporal and occipital lobes. Mild right to left midline shift, unchanged. 4/7-EEG: no epileptic activity 4/8-Had several episodes of bradycardia with stable blood pressure   LINES / TUBES: 4/6-ETT>> 4/6-CVL>>  CULTURES: 4/6- respiratory culture>> 4/6- blood culture>> 4/6- Urine culture>>negative  ANTIBIOTICS: 3/26- Zosyn>>4/5 4/6-Vancomycin>>>4/8 4/6-Cefpeme>>4/8 4/8 ceftriaxone>>>stop 4/12   SUBJECTIVE:  4/7: Patient is intubated and sedated. Tm 101.1  VITAL  SIGNS: Temp:  [98.6 F (37 C)-101.1 F (38.4 C)] 98.8 F (37.1 C) (04/08 0424) Pulse Rate:  [45-117] 103 (04/08 0500) Resp:  [15-28] 22 (04/08 0500) BP: (110-168)/(54-111) 160/93 mmHg (04/08 0500) SpO2:  [92 %-100 %] 100 % (04/08 0500) FiO2 (%):  [40 %] 40 % (04/08 0318) Weight:  [142 lb 13.7 oz (64.8 kg)] 142 lb 13.7 oz (64.8 kg) (04/08 0443) HEMODYNAMICS: CVP:  [2 mmHg-3 mmHg] 3 mmHg VENTILATOR SETTINGS: Vent Mode:  [-] PRVC FiO2 (%):  [40 %] 40 % Set Rate:  [12 bmp] 12 bmp Vt Set:  [450 mL] 450 mL PEEP:  [5 cmH20] 5 cmH20 Pressure Support:  [10 cmH20] 10 cmH20 Plateau Pressure:  [14 cmH20-20 cmH20] 14 cmH20 INTAKE / OUTPUT: Intake/Output     04/07 0701 - 04/08 0700   I.V. (mL/kg) 854.2 (13.2)   NG/GT 300   IV Piggyback 500   Total Intake(mL/kg) 1654.2 (25.5)   Urine (mL/kg/hr) 830 (0.5)   Total Output 830   Net +824.2         PHYSICAL EXAMINATION:  General: the patient is intubated and sedated.  Neuro: unresponsive  HEENT: PERRL   Cardiovascular: irregularly irregular rhythm  Lungs: Rales bilaterally, improved Abdomen: soft, distended. Bowel sound present  Musculoskeletal: moves toes to pain stimulus on both sidwe  Skin: no rashes.  LABS:  CBC  Recent Labs Lab 03/31/14 0748 04/04/14 0833 04/05/14 0420  WBC 14.2* 14.3* 12.4*  HGB 13.0 12.3 11.0*  HCT 43.7 41.1 37.0  PLT 263 291 298   Coag's  Recent Labs Lab 04/05/14 0846  APTT 28  INR 1.08   BMET  Recent Labs Lab 04/04/14 0833 04/05/14 0420 04/06/14 0500  NA 138 136* 139  K 5.2 5.0 3.8  CL 91*  92* 97  CO2 33* 32 30  BUN 17 24* 21  CREATININE 0.53 0.66 0.47*  GLUCOSE 105* 113* 104*   Electrolytes  Recent Labs Lab 04/04/14 0833 04/05/14 0420 04/05/14 0846 04/05/14 1905 04/06/14 0500  CALCIUM 9.5 8.9  --   --  8.5  MG  --  1.7 1.8 1.9  --   PHOS  --  4.2 4.3 3.2  --    Sepsis Markers No results found for this basename: LATICACIDVEN, PROCALCITON, O2SATVEN,  in the last 168  hours ABG  Recent Labs Lab 04/04/14 0505 04/04/14 1814 04/05/14 0326  PHART 7.405 7.456* 7.448  PCO2ART 62.8* 56.8* 50.4*  PO2ART 82.7 382.0* 96.9   Liver Enzymes  Recent Labs Lab 04/04/14 0833  AST 28  ALT 10  ALKPHOS 58  BILITOT 0.4  ALBUMIN 2.5*   Cardiac Enzymes No results found for this basename: TROPONINI, PROBNP,  in the last 168 hours Glucose  Recent Labs Lab 04/05/14 0744 04/05/14 1212 04/05/14 1559 04/05/14 1905 04/06/14 0001 04/06/14 0414  GLUCAP 112* 127* 90 106* 113* 106*    Imaging Ct Head Wo Contrast  04/04/2014   CLINICAL DATA:  Follow up intracranial hemorrhage, no new symptoms  EXAM: CT HEAD WITHOUT CONTRAST  TECHNIQUE: Contiguous axial images were obtained from the base of the skull through the vertex without intravenous contrast.  COMPARISON:  04/01/2014  FINDINGS: Again identified large intraparenchymal hemorrhage within the right temporal lobe extending into right occipital lobe, 6.3 x 3.1 cm in size essentially unchanged from previous study.  Significant surrounding vasogenic edema and effacement of sulci.  Mass effect within right hemisphere with right-to-left midline shift approximately 6 mm, stable.  Minimal dilatation of left lateral ventricle.  Small amount of intraventricular hemorrhage at occipital horn of left lateral ventricle again seen.  No new areas of hemorrhage or infarction.  No extra-axial fluid collections.  Fluid within right mastoid air cells again noted.  Small amount of fluid and mucosal thickening within right maxillary sinus.  Atherosclerotic calcifications at the carotid siphons.  IMPRESSION: No significant change in size/appearance of large intracerebral hematoma involving the right temporal and occipital lobes with associated surrounding edema and mass effect within the right hemisphere.  Persistent mild right to left midline shift, unchanged.   Electronically Signed   By: Lavonia Dana M.D.   On: 04/04/2014 18:02   Dg Chest Port  1 View  04/05/2014   CLINICAL DATA:  Intubation, right pleural effusion  EXAM: PORTABLE CHEST - 1 VIEW  COMPARISON:  Portable exam 0649 hr compared to 04/04/2014  FINDINGS: Endotracheal tube, nasogastric tube and right jugular line unchanged.  Stable heart size and mediastinal contours.  Atherosclerotic calcification aorta.  Emphysematous and mild bronchitic changes with small right basilar pleural effusion.  No segmental consolidation, pleural effusion or pneumothorax.  Question external artifacts at right apex.  IMPRESSION: No significant change.   Electronically Signed   By: Lavonia Dana M.D.   On: 04/05/2014 08:04   Dg Chest Port 1 View  04/04/2014   CLINICAL DATA:  Endotracheal tube and central line placement  EXAM: PORTABLE CHEST - 1 VIEW  COMPARISON:  Portable exam 1805 hr compared to 0510 hr  FINDINGS: Tip of endotracheal tube projects 6.2 cm above carinal.  Right jugular central venous catheter tip projects over mid SVC.  Nasogastric tube extends into stomach.  Normal heart size, mediastinal contours and pulmonary vascularity.  Probable small layered right pleural effusion.  Remaining lungs clear.  No pneumothorax  or acute osseous findings.  IMPRESSION: Line and tube positions as above.  Probable small right pleural effusion.   Electronically Signed   By: Lavonia Dana M.D.   On: 04/04/2014 18:19   Dg Abd Portable 1v  04/04/2014   CLINICAL DATA:  Orogastric tube placement  EXAM: PORTABLE ABDOMEN - 1 VIEW  COMPARISON:  Portable exam 1801 hr compared to 03/31/2014  FINDINGS: Tip of orogastric tube projects over pylorus/duodenal bulb region.  Normal colonic gas and stool.  Few nonspecific mildly prominent loops of air-filled small bowel in mid abdomen.  No bowel wall thickening or evidence of obstruction.  IMPRESSION: Tip of orogastric tube projects over pylorus/duodenal bulb region.   Electronically Signed   By: Lavonia Dana M.D.   On: 04/04/2014 18:16    ASSESSMENT / PLAN: PULMONARY A: mostly likely  aspiration PNA, ARF P:   -no new abg pcxr today planned sbt cpap 5 ps 5 as goal, 4-6 hrs -plan trach today  CARDIOVASCULAR A:   - A Fib, Hr controlled, 2D echo on 03/19/14 showed EF 55 to 60% without wall motion abnormality.  - Had bradycardia last night with stable Bp - resolved brady -  P:  - Digoxin 0.125 mg daily, 0.8 level.  - Metoprolol 75 mg bid, may need reduction, add hold parameters for al l3  - Diltiazem 90 mg tid - SCD for DVT ppx -MAp goals 70 -cvp, 3, allow pos balance  RENAL A:  Mild hypochloriemia - brain edema  P:   -no free water planned - monitor BMP - check Mg and Phosphorus -goal 15 cc/hr urine, met -cvp 3, allow pos balance, no  Lasix  -IVF: NS 50/h  GASTROINTESTINAL A: No acute issue P:    - IV pepcid -TF hold for trach  HEMATOLOGIC A:  Leukocytosis secondary to aspiration PNA, ICH P:  -coags wnl -scd for now -consider sub q hep after d.w neuro  INFECTIOUS A: aspiration PNA P:    -Vancomycin and Cefepime - narrow off , add ceftriaxone, add stop date  ENDOCRINE A:  DM-II, A1c 6.1 on 03/19/14. CBG 104 in AM P:   -SSI-senstive  NEUROLOGIC A:   Hx of ischemic stroke. Now acute hemorrhagic conversion of ischemic CVA. Airway compromise -CT-head 4/7: No significant change in her large intracerebral hematoma involving the right temporal and occipital lobes. Mild right to left midline shift, unchanged. -EEG: no epileptic activity  P:   - No anticoagulant -more responsive to pain, no mri needed -restarted valproic -eeg reassuring no seziures  Ivor Costa, MD PGY3, Internal Medicine Teaching Service Pager: [phone]  TODAY'S SUMMARY: trach likely, wean  I have personally obtained a history, examined the patient, evaluated laboratory and imaging results, formulated the assessment and plan and placed orders. CRITICAL CARE: The patient is critically ill with multiple organ systems failure and requires high complexity decision making for  assessment and support, frequent evaluation and titration of therapies, application of advanced monitoring technologies and extensive interpretation of multiple databases. Critical Care Time devoted to patient care services described in this note is 30 minutes.   Lavon Paganini. Titus Mould, MD, FACP Pgr: [facility]  Pulmonary and [place] Pager: [phone]  04/06/2014, 6:50 AM

## 2014-04-06 NOTE — Procedures (Signed)
Perc trach See full dictation Blood loss 5 cc 6 shiley Tolerated well  Lavon Paganini. Titus Mould, MD, [place] Pgr: [facility]

## 2014-04-06 NOTE — H&P (Signed)
Colleen [name] is an 63 y.o. female.   Chief Complaint: Pt lives in SNF CVA 03/16/2014 Developed fevers; increased secretions Worsened and brought to [facility] for evaluation TRH transferred to [facility] for Respiratory failure management Now with vent/trach No response- slight movement to pain Aspiration PNA Long term care Request made for percutaneous gastric tube placement  HPI: HTN; COPD; GERD; HLD; DM; hyperthyroid; CVA; PAF  Past Medical History  Diagnosis Date  . Hypertension   . Hiatal hernia   . Adenomatous polyp of colon   . Acute asthmatic bronchitis   . GERD (gastroesophageal reflux disease)     EGD neg 03/07/2008, but prev required dilation  . COPD (chronic obstructive pulmonary disease)   . IBS (irritable bowel syndrome)     chronic  . Urinary incontinence   . Esophagitis   . Chronic gastritis   . Duodenitis without hemorrhage 10/01/2001  . Diverticulosis   . Personal history of colonic adenomas 02/05/2008        . Anemia   . Hyperlipidemia   . Decreased peripheral vision of right eye   . Pneumonia 2011  . Hyperthyroidism   . Type II diabetes mellitus   . Stroke     a. Hx of stroke and ministrokes ~ 2009. b. TIA 11/2013, dx with AF at that time.  . Arthritis   . Depression   . On home oxygen therapy     "2L; suppose to use 24/7" (01/05/2014)  . PAF (paroxysmal atrial fibrillation)     a. Prior hx. b. Recurred 11/2013 in setting of possible TIA. Lifelong Coumadin planned. Spont converted to NSR. EF 60-65% by echo 11/2013, normal cors 2007, normal nuc 2011.   . Atrial flutter     a. Dx 12/2013. s/p DCCV. On anticoagulation.    Past Surgical History  Procedure Laterality Date  . Colonoscopy    . Upper gi endoscopy    . Cholecystectomy  1980's  . Vaginal hysterectomy  1992  . Breast biopsy Left 1987    "milk gland"  . Cardioversion N/A 01/06/2014    Procedure: CARDIOVERSION;  Surgeon: Lewayne Bunting, MD;  Location: [facility] OR;  Service: Cardiovascular;  Laterality: N/A;  .  Tracheostomy      feinstein    Family History  Problem Relation Age of Onset  . Heart disease Mother   . Asthma Father   . Lung cancer Father   . Asthma Sister   . Heart disease Sister   . Colon cancer Father    Social History:  reports that she has been smoking Cigarettes.  She has a 51 pack-year smoking history. She has never used smokeless tobacco. She reports that she does not drink alcohol or use illicit drugs.  Allergies:  Allergies  Allergen Reactions  . Crestor [Rosuvastatin]   . Latex Hives  . Wellbutrin [Bupropion]     Medications Prior to Admission  Medication Sig Dispense Refill  . ALPRAZolam (XANAX) 0.5 MG tablet Take 0.5 mg by mouth at bedtime.       [name] atorvastatin (LIPITOR) 20 MG tablet Take 1 tablet (20 mg total) by mouth every evening.  30 tablet  6  . cetirizine (ZYRTEC) 10 MG tablet Take 10 mg by mouth at bedtime.       . digoxin (LANOXIN) 0.25 MG tablet Take 0.5 tablets (0.125 mg total) by mouth daily.  30 tablet  0  . diltiazem (CARDIZEM CD) 360 MG 24 hr capsule Take 1 capsule (360 mg total) by  mouth daily.  30 capsule  0  . DULoxetine (CYMBALTA) 60 MG capsule Take 120 mg by mouth daily.      . ferrous sulfate 325 (65 FE) MG tablet Take 325 mg by mouth 2 (two) times daily with a meal.      . Fluticasone-Salmeterol (ADVAIR DISKUS) 250-50 MCG/DOSE AEPB Inhale 1 puff into the lungs 2 (two) times daily.       . furosemide (LASIX) 40 MG tablet Take 40 mg by mouth daily.       [name] HYDROcodone-acetaminophen (NORCO) 7.5-325 MG per tablet Take 1 tablet by mouth every 6 (six) hours as needed for moderate pain.      [name] levalbuterol (XOPENEX HFA) 45 MCG/ACT inhaler Inhale 2 puffs into the lungs every 4 (four) hours as needed for wheezing or shortness of breath.      . levalbuterol (XOPENEX) 0.63 MG/3ML nebulizer solution Take 3 mLs (0.63 mg total) by nebulization every 3 (three) hours as needed for wheezing or shortness of breath.  3 mL  0  . metFORMIN (GLUMETZA) 1000 MG  (MOD) 24 hr tablet Take 1,000 mg by mouth 2 (two) times daily with a meal.      . methimazole (TAPAZOLE) 5 MG tablet Take 5 mg by mouth daily.      . metoprolol tartrate (LOPRESSOR) 25 MG tablet Take 3 tablets (75 mg total) by mouth 2 (two) times daily.  360 tablet  0  . omeprazole (PRILOSEC) 40 MG capsule Take 40 mg by mouth 2 (two) times daily.       . solifenacin (VESICARE) 10 MG tablet Take 10 mg by mouth daily.      [name] tiotropium (SPIRIVA) 18 MCG inhalation capsule Place 18 mcg into inhaler and inhale daily.       [name] trimethoprim (TRIMPEX) 100 MG tablet Take 100 mg by mouth at bedtime.        Results for orders placed during the hospital encounter of 04/04/14 (from the past 48 hour(s))  GLUCOSE, CAPILLARY     Status: None   Collection Time    04/04/14  4:01 PM      Result Value Ref Range   Glucose-Capillary 92  70 - 99 mg/dL  POCT I-STAT 3, BLOOD GAS (G3+)     Status: Abnormal   Collection Time    04/04/14  6:14 PM      Result Value Ref Range   pH, Arterial 7.456 (*) 7.350 - 7.450   pCO2 arterial 56.8 (*) 35.0 - 45.0 mmHg   pO2, Arterial 382.0 (*) 80.0 - 100.0 mmHg   Bicarbonate 39.9 (*) 20.0 - 24.0 mEq/L   TCO2 42  0 - 100 mmol/L   O2 Saturation 100.0     Acid-Base Excess 13.0 (*) 0.0 - 2.0 mmol/L   Patient temperature 99.0 F     Collection site RADIAL, ALLEN'S TEST ACCEPTABLE     Sample type ARTERIAL    GLUCOSE, CAPILLARY     Status: Abnormal   Collection Time    04/04/14  6:49 PM      Result Value Ref Range   Glucose-Capillary 112 (*) 70 - 99 mg/dL  DIGOXIN LEVEL     Status: None   Collection Time    04/04/14  8:06 PM      Result Value Ref Range   Digoxin Level 0.8  0.8 - 2.0 ng/mL  CULTURE, BLOOD (ROUTINE X 2)     Status: None   Collection Time    04/04/14  8:06 PM      Result Value Ref Range   Specimen Description BLOOD RIGHT HAND     Special Requests BOTTLES DRAWN AEROBIC ONLY 3CC     Culture  Setup Time       Value: 04/05/2014 01:02     Performed at [facility]   Culture       Value:        BLOOD CULTURE RECEIVED NO GROWTH TO DATE CULTURE WILL BE HELD FOR 5 DAYS BEFORE ISSUING A FINAL NEGATIVE REPORT     Performed at [facility]   Report Status PENDING    CULTURE, BLOOD (ROUTINE X 2)     Status: None   Collection Time    04/04/14  8:12 PM      Result Value Ref Range   Specimen Description BLOOD LEFT HAND     Special Requests BOTTLES DRAWN AEROBIC ONLY 1CC     Culture  Setup Time       Value: 04/05/2014 01:02     Performed at [facility]   Culture       Value:        BLOOD CULTURE RECEIVED NO GROWTH TO DATE CULTURE WILL BE HELD FOR 5 DAYS BEFORE ISSUING A FINAL NEGATIVE REPORT     Performed at [facility]   Report Status PENDING    GLUCOSE, CAPILLARY     Status: Abnormal   Collection Time    04/05/14 12:37 AM      Result Value Ref Range   Glucose-Capillary 113 (*) 70 - 99 mg/dL   Comment 1 Documented in Chart     Comment 2 Notify RN    BLOOD GAS, ARTERIAL     Status: Abnormal   Collection Time    04/05/14  3:26 AM      Result Value Ref Range   FIO2 0.40     Delivery systems VENTILATOR     Mode PRESSURE REGULATED VOLUME CONTROL     VT 450     Rate 18     Peep/cpap 5.0     pH, Arterial 7.448  7.350 - 7.450   pCO2 arterial 50.4 (*) 35.0 - 45.0 mmHg   pO2, Arterial 96.9  80.0 - 100.0 mmHg   Bicarbonate 34.1 (*) 20.0 - 24.0 mEq/L   TCO2 35.6  0 - 100 mmol/L   Acid-Base Excess 9.8 (*) 0.0 - 2.0 mmol/L   O2 Saturation 97.0     Patient temperature 99.9     Collection site RIGHT RADIAL     Drawn by [phone]     Sample type ARTERIAL DRAW     Allens test (pass/fail) PASS  PASS  GLUCOSE, CAPILLARY     Status: Abnormal   Collection Time    04/05/14  3:44 AM      Result Value Ref Range   Glucose-Capillary 111 (*) 70 - 99 mg/dL   Comment 1 Notify RN     Comment 2 Documented in Chart    BASIC METABOLIC PANEL     Status: Abnormal   Collection Time    04/05/14  4:20 AM      Result Value Ref Range    Sodium 136 (*) 137 - 147 mEq/L   Potassium 5.0  3.7 - 5.3 mEq/L   Chloride 92 (*) 96 - 112 mEq/L   CO2 32  19 - 32 mEq/L   Glucose, Bld 113 (*) 70 - 99 mg/dL   BUN 24 (*)  6 - 23 mg/dL   Creatinine, Ser 0.66  0.50 - 1.10 mg/dL   Calcium 8.9  8.4 - 10.5 mg/dL   GFR calc non Af Amer >90  >90 mL/min   GFR calc Af Amer >90  >90 mL/min   Comment: (NOTE)     The eGFR has been calculated using the CKD EPI equation.     This calculation has not been validated in all clinical situations.     eGFR's persistently <90 mL/min signify possible Chronic Kidney     Disease.  CBC     Status: Abnormal   Collection Time    04/05/14  4:20 AM      Result Value Ref Range   WBC 12.4 (*) 4.0 - 10.5 K/uL   RBC 4.73  3.87 - 5.11 MIL/uL   Hemoglobin 11.0 (*) 12.0 - 15.0 g/dL   HCT 37.0  36.0 - 46.0 %   MCV 78.2  78.0 - 100.0 fL   MCH 23.3 (*) 26.0 - 34.0 pg   MCHC 29.7 (*) 30.0 - 36.0 g/dL   RDW 21.8 (*) 11.5 - 15.5 %   Platelets 298  150 - 400 K/uL  MAGNESIUM     Status: None   Collection Time    04/05/14  4:20 AM      Result Value Ref Range   Magnesium 1.7  1.5 - 2.5 mg/dL  PHOSPHORUS     Status: None   Collection Time    04/05/14  4:20 AM      Result Value Ref Range   Phosphorus 4.2  2.3 - 4.6 mg/dL  GLUCOSE, CAPILLARY     Status: Abnormal   Collection Time    04/05/14  7:44 AM      Result Value Ref Range   Glucose-Capillary 112 (*) 70 - 99 mg/dL  MAGNESIUM     Status: None   Collection Time    04/05/14  8:46 AM      Result Value Ref Range   Magnesium 1.8  1.5 - 2.5 mg/dL  PHOSPHORUS     Status: None   Collection Time    04/05/14  8:46 AM      Result Value Ref Range   Phosphorus 4.3  2.3 - 4.6 mg/dL  APTT     Status: None   Collection Time    04/05/14  8:46 AM      Result Value Ref Range   aPTT 28  24 - 37 seconds  PROTIME-INR     Status: None   Collection Time    04/05/14  8:46 AM      Result Value Ref Range   Prothrombin Time 13.8  11.6 - 15.2 seconds   INR 1.08  0.00 - 1.49   GLUCOSE, CAPILLARY     Status: Abnormal   Collection Time    04/05/14 12:12 PM      Result Value Ref Range   Glucose-Capillary 127 (*) 70 - 99 mg/dL  GLUCOSE, CAPILLARY     Status: None   Collection Time    04/05/14  3:59 PM      Result Value Ref Range   Glucose-Capillary 90  70 - 99 mg/dL  MAGNESIUM     Status: None   Collection Time    04/05/14  7:05 PM      Result Value Ref Range   Magnesium 1.9  1.5 - 2.5 mg/dL  PHOSPHORUS     Status: None   Collection Time    04/05/14  7:05 PM      Result Value Ref Range   Phosphorus 3.2  2.3 - 4.6 mg/dL  GLUCOSE, CAPILLARY     Status: Abnormal   Collection Time    04/05/14  7:05 PM      Result Value Ref Range   Glucose-Capillary 106 (*) 70 - 99 mg/dL  GLUCOSE, CAPILLARY     Status: Abnormal   Collection Time    04/06/14 12:01 AM      Result Value Ref Range   Glucose-Capillary 113 (*) 70 - 99 mg/dL   Comment 1 Notify RN    GLUCOSE, CAPILLARY     Status: Abnormal   Collection Time    04/06/14  4:14 AM      Result Value Ref Range   Glucose-Capillary 106 (*) 70 - 99 mg/dL   Comment 1 Notify RN    BASIC METABOLIC PANEL     Status: Abnormal   Collection Time    04/06/14  5:00 AM      Result Value Ref Range   Sodium 139  137 - 147 mEq/L   Potassium 3.8  3.7 - 5.3 mEq/L   Comment: DELTA CHECK NOTED   Chloride 97  96 - 112 mEq/L   CO2 30  19 - 32 mEq/L   Glucose, Bld 104 (*) 70 - 99 mg/dL   BUN 21  6 - 23 mg/dL   Creatinine, Ser 0.47 (*) 0.50 - 1.10 mg/dL   Calcium 8.5  8.4 - 10.5 mg/dL   GFR calc non Af Amer >90  >90 mL/min   GFR calc Af Amer >90  >90 mL/min   Comment: (NOTE)     The eGFR has been calculated using the CKD EPI equation.     This calculation has not been validated in all clinical situations.     eGFR's persistently <90 mL/min signify possible Chronic Kidney     Disease.  MAGNESIUM     Status: None   Collection Time    04/06/14  8:11 AM      Result Value Ref Range   Magnesium 1.8  1.5 - 2.5 mg/dL  PHOSPHORUS      Status: None   Collection Time    04/06/14  8:11 AM      Result Value Ref Range   Phosphorus 3.3  2.3 - 4.6 mg/dL  CBC     Status: Abnormal   Collection Time    04/06/14  8:11 AM      Result Value Ref Range   WBC 13.2 (*) 4.0 - 10.5 K/uL   RBC 4.48  3.87 - 5.11 MIL/uL   Hemoglobin 10.4 (*) 12.0 - 15.0 g/dL   HCT 35.4 (*) 36.0 - 46.0 %   MCV 79.0  78.0 - 100.0 fL   MCH 23.2 (*) 26.0 - 34.0 pg   MCHC 29.4 (*) 30.0 - 36.0 g/dL   RDW 21.4 (*) 11.5 - 15.5 %   Platelets 286  150 - 400 K/uL  PROTIME-INR     Status: None   Collection Time    04/06/14  8:11 AM      Result Value Ref Range   Prothrombin Time 14.0  11.6 - 15.2 seconds   INR 1.10  0.00 - 1.49  APTT     Status: None   Collection Time    04/06/14  8:15 AM      Result Value Ref Range   aPTT 25  24 - 37 seconds  GLUCOSE, CAPILLARY  Status: None   Collection Time    04/06/14  8:35 AM      Result Value Ref Range   Glucose-Capillary 97  70 - 99 mg/dL  GLUCOSE, CAPILLARY     Status: Abnormal   Collection Time    04/06/14 11:43 AM      Result Value Ref Range   Glucose-Capillary 100 (*) 70 - 99 mg/dL   Ct Head Wo Contrast  04/04/2014   CLINICAL DATA:  Follow up intracranial hemorrhage, no new symptoms  EXAM: CT HEAD WITHOUT CONTRAST  TECHNIQUE: Contiguous axial images were obtained from the base of the skull through the vertex without intravenous contrast.  COMPARISON:  04/01/2014  FINDINGS: Again identified large intraparenchymal hemorrhage within the right temporal lobe extending into right occipital lobe, 6.3 x 3.1 cm in size essentially unchanged from previous study.  Significant surrounding vasogenic edema and effacement of sulci.  Mass effect within right hemisphere with right-to-left midline shift approximately 6 mm, stable.  Minimal dilatation of left lateral ventricle.  Small amount of intraventricular hemorrhage at occipital horn of left lateral ventricle again seen.  No new areas of hemorrhage or infarction.  No  extra-axial fluid collections.  Fluid within right mastoid air cells again noted.  Small amount of fluid and mucosal thickening within right maxillary sinus.  Atherosclerotic calcifications at the carotid siphons.  IMPRESSION: No significant change in size/appearance of large intracerebral hematoma involving the right temporal and occipital lobes with associated surrounding edema and mass effect within the right hemisphere.  Persistent mild right to left midline shift, unchanged.   Electronically Signed   By: Lavonia Dana M.D.   On: 04/04/2014 18:02   Chest Portable 1 View To Assess Tube Placement And Rule-out Pneumothorax  04/06/2014   CLINICAL DATA:  Tracheostomy placement  EXAM: PORTABLE CHEST - 1 VIEW  COMPARISON:  April 05, 2014  FINDINGS: There is no a tracheostomy tube present which is well seated. The tip of the tracheostomy is 7.0 cm above the carina. Nasogastric tube tip is in the proximal stomach with the side port in the distal esophagus. Central catheter tip is in the superior vena cava near the cavoatrial junction. No pneumothorax.  There is airspace consolidation in the right lower lobe with small right effusion. Left lung is clear. Heart size and pulmonary vascularity are normal. There is atherosclerotic change in the aorta. No adenopathy.  IMPRESSION: Tube and catheter positions as described. No pneumothorax. Note that the nasogastric tube side port is in the distal esophagus. Advise advancing nasogastric tube approximately 8 to 10 cm to confirm placement in the stomach.  Right lower lobe consolidation with effusion. Lungs elsewhere clear.   Electronically Signed   By: Lowella Grip M.D.   On: 04/06/2014 12:59   Dg Chest Port 1 View  04/05/2014   CLINICAL DATA:  Intubation, right pleural effusion  EXAM: PORTABLE CHEST - 1 VIEW  COMPARISON:  Portable exam 0649 hr compared to 04/04/2014  FINDINGS: Endotracheal tube, nasogastric tube and right jugular line unchanged.  Stable heart size and  mediastinal contours.  Atherosclerotic calcification aorta.  Emphysematous and mild bronchitic changes with small right basilar pleural effusion.  No segmental consolidation, pleural effusion or pneumothorax.  Question external artifacts at right apex.  IMPRESSION: No significant change.   Electronically Signed   By: Lavonia Dana M.D.   On: 04/05/2014 08:04   Dg Chest Port 1 View  04/04/2014   CLINICAL DATA:  Endotracheal tube and central line placement  EXAM: PORTABLE CHEST - 1 VIEW  COMPARISON:  Portable exam 1805 hr compared to 0510 hr  FINDINGS: Tip of endotracheal tube projects 6.2 cm above carinal.  Right jugular central venous catheter tip projects over mid SVC.  Nasogastric tube extends into stomach.  Normal heart size, mediastinal contours and pulmonary vascularity.  Probable small layered right pleural effusion.  Remaining lungs clear.  No pneumothorax or acute osseous findings.  IMPRESSION: Line and tube positions as above.  Probable small right pleural effusion.   Electronically Signed   By: Lavonia Dana M.D.   On: 04/04/2014 18:19   Dg Abd Portable 1v  04/04/2014   CLINICAL DATA:  Orogastric tube placement  EXAM: PORTABLE ABDOMEN - 1 VIEW  COMPARISON:  Portable exam 1801 hr compared to 03/31/2014  FINDINGS: Tip of orogastric tube projects over pylorus/duodenal bulb region.  Normal colonic gas and stool.  Few nonspecific mildly prominent loops of air-filled small bowel in mid abdomen.  No bowel wall thickening or evidence of obstruction.  IMPRESSION: Tip of orogastric tube projects over pylorus/duodenal bulb region.   Electronically Signed   By: Lavonia Dana M.D.   On: 04/04/2014 18:16    Review of Systems  Constitutional: Positive for fever and weight loss.  Respiratory: Positive for sputum production.   Gastrointestinal: Negative for nausea and vomiting.  Neurological: Positive for weakness.    Blood pressure 118/65, pulse 100, temperature 100.8 F (38.2 C), temperature source Oral, resp.  rate 20, height $RemoveBe'5\' 5"'[id number]$  (1.651 m), weight 64.8 kg (142 lb 13.7 oz), SpO2 98.00%. Physical Exam  Cardiovascular: Normal rate and regular rhythm.   Respiratory: She is in respiratory distress. She has wheezes.  GI: Soft. Bowel sounds are normal.  No visible surgical scars  Musculoskeletal:  On vent no response  Skin: Skin is warm.  Psychiatric:  Will call family for consent     Assessment/Plan CVA- lives in SNF fevers; increased secretions;resp failure On vent Aspiration pna Wbc 13.2 (12.4) Tmax last pm 101.1; today 100.8 Need for long term care Request for perc G tube placement per CCM IR PA will continue to monitor temp and wbc Will move forward when appropriate- prob Fri or Mon  Lavonia Drafts 04/06/2014, 3:28 PM

## 2014-04-06 NOTE — Progress Notes (Signed)
PT Cancellation Note  Patient Details Name: Colleen [name] MRN: [id number] DOB: 07-17-1951   Cancelled Treatment:    Reason Eval/Treat Not Completed: Medical issues which prohibited therapy;Patient at procedure or test/unavailable.  Pt trached today. 04/06/2014  Donnella Sham, [place] [phone]  (pager)   Tessie Fass [name] 04/06/2014, 4:20 PM

## 2014-04-06 NOTE — Procedures (Signed)
Bedside Tracheostomy Insertion Procedure Note   Patient Details:   Name: Colleen [name] DOB: 11/18/1951 MRN: [id number]  Procedure: Tracheostomy  Pre Procedure Assessment: ET Tube Size: 7.5 ET Tube secured at lip (cm): 23 Bite block in place: No, paralytic given Breath Sounds: Clear  Post Procedure Assessment: BP 130/84  Pulse 92  Temp(Src) 97.8 F (36.6 C) (Oral)  Resp 24  Ht 5\' 5"  (1.651 m)  Wt 142 lb 13.7 oz (64.8 kg)  BMI 23.77 kg/m2  SpO2 100% O2 sats: stable throughout Complications: No apparent complications Patient did tolerate procedure well Tracheostomy Brand:Shiley Tracheostomy Style:Cuffed Tracheostomy Size: 6 Tracheostomy Secured UUE:[id number], velcro Tracheostomy Placement Confirmation:Trach cuff visualized and in place and Chest X ray ordered for placement    Elam Dutch 04/06/2014, 11:23 AM

## 2014-04-06 NOTE — Discharge Summary (Signed)
  Discharge summary job # [id number]

## 2014-04-07 LAB — CBC
HCT: 32.5 % — ABNORMAL LOW (ref 36.0–46.0)
Hemoglobin: 9.5 g/dL — ABNORMAL LOW (ref 12.0–15.0)
MCH: 23.2 pg — ABNORMAL LOW (ref 26.0–34.0)
MCHC: 29.2 g/dL — AB (ref 30.0–36.0)
MCV: 79.3 fL (ref 78.0–100.0)
Platelets: 248 10*3/uL (ref 150–400)
RBC: 4.1 MIL/uL (ref 3.87–5.11)
RDW: 21.4 % — AB (ref 11.5–15.5)
WBC: 11.4 10*3/uL — ABNORMAL HIGH (ref 4.0–10.5)

## 2014-04-07 LAB — GLUCOSE, CAPILLARY
GLUCOSE-CAPILLARY: 109 mg/dL — AB (ref 70–99)
GLUCOSE-CAPILLARY: 125 mg/dL — AB (ref 70–99)
GLUCOSE-CAPILLARY: 134 mg/dL — AB (ref 70–99)
Glucose-Capillary: 109 mg/dL — ABNORMAL HIGH (ref 70–99)
Glucose-Capillary: 146 mg/dL — ABNORMAL HIGH (ref 70–99)
Glucose-Capillary: 154 mg/dL — ABNORMAL HIGH (ref 70–99)
Glucose-Capillary: 164 mg/dL — ABNORMAL HIGH (ref 70–99)

## 2014-04-07 LAB — COMPREHENSIVE METABOLIC PANEL
ALBUMIN: 1.9 g/dL — AB (ref 3.5–5.2)
ALK PHOS: 46 U/L (ref 39–117)
ALT: 13 U/L (ref 0–35)
AST: 26 U/L (ref 0–37)
BILIRUBIN TOTAL: 0.3 mg/dL (ref 0.3–1.2)
BUN: 23 mg/dL (ref 6–23)
CHLORIDE: 96 meq/L (ref 96–112)
CO2: 30 meq/L (ref 19–32)
CREATININE: 0.45 mg/dL — AB (ref 0.50–1.10)
Calcium: 8.7 mg/dL (ref 8.4–10.5)
GFR calc Af Amer: >90 mL/min (ref >90)
Glucose, Bld: 127 mg/dL — ABNORMAL HIGH (ref 70–99)
POTASSIUM: 3.5 meq/L — AB (ref 3.7–5.3)
Sodium: 138 mEq/L (ref 137–147)
Total Protein: 5.7 g/dL — ABNORMAL LOW (ref 6.0–8.3)

## 2014-04-07 LAB — HIV ANTIBODY (ROUTINE TESTING W REFLEX): HIV 1&2 Ab, 4th Generation: NONREACTIVE

## 2014-04-07 LAB — CLOSTRIDIUM DIFFICILE BY PCR: CDIFFPCR: NEGATIVE

## 2014-04-07 LAB — PHOSPHORUS: Phosphorus: 3 mg/dL (ref 2.3–4.6)

## 2014-04-07 LAB — MAGNESIUM: Magnesium: 1.6 mg/dL (ref 1.5–2.5)

## 2014-04-07 MED ORDER — MAGNESIUM SULFATE 40 MG/ML IJ SOLN
2.0000 g | Freq: Once | INTRAMUSCULAR | Status: AC
  Administered 2014-04-07: 2 g via INTRAVENOUS
  Filled 2014-04-07: qty 50

## 2014-04-07 MED ORDER — SODIUM CHLORIDE 0.9 % IV SOLN
INTRAVENOUS | Status: DC
  Administered 2014-04-07: 20 mL/h via INTRAVENOUS
  Administered 2014-04-12: 12:00:00 via INTRAVENOUS

## 2014-04-07 MED ORDER — POTASSIUM CHLORIDE 10 MEQ/100ML IV SOLN: 10.0000 meq | INTRAVENOUS | Status: DC

## 2014-04-07 MED ORDER — POTASSIUM CHLORIDE 10 MEQ/50ML IV SOLN
10.0000 meq | INTRAVENOUS | Status: AC
  Administered 2014-04-07 (×4): 10 meq via INTRAVENOUS
  Filled 2014-04-07: qty 50

## 2014-04-07 NOTE — Progress Notes (Signed)
PT Cancellation Note  Patient Details Name: MAEGHAN CANNY MRN: [id number] DOB: 16-Aug-1951   Cancelled Treatment:    Reason Eval/Treat Not Completed: Medical issues which prohibited therapy. 04/07/2014  Donnella Sham, [place] [phone]  (pager)    Tessie Fass Retal [place]kinson 04/07/2014, 1:47 PM

## 2014-04-07 NOTE — Clinical Social Work Psychosocial (Signed)
Clinical Social Work Department BRIEF PSYCHOSOCIAL ASSESSMENT 04/07/2014  Patient:  Colleen [name], Colleen [name]     Account Number:  [id number]     Admit date:  04/04/2014  Clinical Social Worker:  Wylene Men  Date/Time:  04/07/2014 12:28 PM  Referred by:  Physician  Date Referred:  04/07/2014 Referred for  SNF Placement   Other Referral:   none   Interview type:  Other - See comment Other interview type:   daughter, Colleen [name]    PSYCHOSOCIAL DATA Living Status:  ALONE Admitted from facility:   Level of care:   Primary support name:  Colleen [name] and Colleen [name] Primary support relationship to patient:  CHILD, ADULT Degree of support available:   adequate    CURRENT CONCERNS Current Concerns  Post-Acute Placement   Other Concerns:   none    SOCIAL WORK ASSESSMENT / PLAN Pt was recently admitted to [facility] and dc'd to [facility].  Medical status declined and was re-admitted to [facility] and now in need of SNF placement.    CSW spoke with daughter, Colleen [name] regarding possible placement upon dc.  Colleen [name] was expecting SNF placement and is agreeable.  CSW made daughter aware of the possibility that options may be minimal based on the pt having Medicaid only and also having trach needs.  CSW reassured daughter, that though the options may be minimal, a safe dc plan would be provided.  Daughter expressed gratitude.    Daughter states that the family including pt is from [facility] so any facility in [place] would be first choice.  CSW explained the LOG process and the fact that the patient's SNF search would stretch within a 50 mile radius.  Daughter is agreeable. Daughter states that mom's medical condition is scary, but is hopeful that pt would regain baseline level of functioning so pt can soon return home.    Pt still dependent intermittently on BPAP and has trach. Pt respiratory needs to be established prior to SNF search.   Assessment/plan status:  Psychosocial Support/Ongoing Assessment of  Needs Other assessment/ plan:   FL2 and PASARR   Information/referral to community resources:   SNF    PATIENT'S/FAMILY'S RESPONSE TO PLAN OF CARE: Pt daughter was agreeable to SNF search and LOG process. Daughter will share this information with sibling.  Colleen [name] was appreciative of CSW assistance.       Nonnie Done, [place] [phone]  Clinical Social Work

## 2014-04-07 NOTE — Progress Notes (Signed)
PULMONARY / CRITICAL CARE MEDICINE   Name: Colleen [name] MRN: [id number] DOB: 1951/09/17    ADMISSION DATE:  04/04/2014 CONSULTATION DATE:  4/6 BRIEF PATIENT DESCRIPTION:   Colleen [name] is a 63 y.o. Caucasian female with history of COPD, type 2 diabetes, hypertension, atrial fibrillation, and recent right occipital infarct.   She was on Coumadin which was discontinued due to petechial hemorrhage as shown on CT-head on 03/16/14. The patient was recently admitted to ICU and treated for HCAP. Patient was placed on a feeding tube for nutrition on 03/28/2012. She was subsequently discharged to inpatient rehabilitation. She has continued Zosy Patient has also been on valproate which is therapeutic per valproic acid level test on 04/01/14. The patient developed low-grade fevers which has been progressively worsening. She had a fever of 103 and subsequently became tachypnea. Patient was noticed to have copious amount of secretions. The patient was admitted to [facility]. She is comatose status and can not protect her airway and therefore transferred to ICU.  SIGNIFICANT EVENTS / STUDIES:   4/3- Stable hematoma in the right temporal and occipital lobes with subfalcine herniation. Stable lateral ventricular size with trace hemorrhage in the left   lateral ventricle.  4/6-CXR, moderate right-sided pleural effusion, with associated airspace opacity. 4/6-CT-head: No significant change in her large intracerebral hematoma involving the right temporal and occipital lobes. Mild right to left midline shift, unchanged. 4/7-EEG: no epileptic activity 4/8-Had several episodes of bradycardia with stable blood pressure 4/8-trach placed  LINES / TUBES: 4/6-ETT>> 4/6-CVL>> 4/8-trach (DF)>>  CULTURES: 4/6- respiratory culture>>negative 4/6- blood culture>> 4/6- Urine culture>>negative  ANTIBIOTICS: 3/26- Zosyn>>4/5 4/6-Vancomycin>>>4/8 4/6-Cefpeme>>4/8 4/8 ceftriaxone>>>stop 4/12  SUBJECTIVE:  4/7: Patient sedated.  Tm 100.8  VITAL SIGNS: Temp:  [97.8 F (36.6 C)-100.8 F (38.2 C)] 99.5 F (37.5 C) (04/09 0400) Pulse Rate:  [40-130] 46 (04/09 0600) Resp:  [19-25] 24 (04/09 0600) BP: (93-158)/(51-84) 121/51 mmHg (04/09 0600) SpO2:  [95 %-100 %] 95 % (04/09 0500) FiO2 (%):  [30 %-100 %] 30 % (04/09 0347) Weight:  [145 lb 1 oz (65.8 kg)] 145 lb 1 oz (65.8 kg) (04/09 0500) HEMODYNAMICS: CVP:  [2 mmHg-3 mmHg] 3 mmHg VENTILATOR SETTINGS: Vent Mode:  [-] PRVC FiO2 (%):  [30 %-100 %] 30 % Set Rate:  [12 bmp-20 bmp] 12 bmp Vt Set:  [450 mL] 450 mL PEEP:  [5 cmH20] 5 cmH20 Plateau Pressure:  [10 cmH20-16 cmH20] 16 cmH20 INTAKE / OUTPUT: Intake/Output     04/08 0701 - 04/09 0700 04/09 0701 - 04/10 0700   I.V. (mL/kg) 1132.3 (17.2)    NG/GT 855    IV Piggyback 100    Total Intake(mL/kg) 2087.3 (31.7)    Urine (mL/kg/hr) 450 (0.3)    Total Output 450     Net +1637.3            PHYSICAL EXAMINATION:  General: the patient is intubated and sedated.  Neuro: unresponsive  HEENT: PERRL   Cardiovascular: irregularly irregular rhythm  Lungs: Rales bilaterally, improved Abdomen: soft, distended. Bowel sound present  Musculoskeletal: moves toes to pain stimulus on both sidwe  Skin: no rashes.  LABS:  CBC  Recent Labs Lab 04/05/14 0420 04/06/14 0811 04/07/14 0425  WBC 12.4* 13.2* 11.4*  HGB 11.0* 10.4* 9.5*  HCT 37.0 35.4* 32.5*  PLT 298 286 248   Coag's  Recent Labs Lab 04/05/14 0846 04/06/14 0811 04/06/14 0815  APTT 28  --  25  INR 1.08 1.10  --    BMET  Recent Labs Lab 04/05/14 0420 04/06/14 0500 04/07/14 0425  NA 136* 139 138  K 5.0 3.8 3.5*  CL 92* 97 96  CO2 32 30 30  BUN 24* 21 23  CREATININE 0.66 0.47* 0.45*  GLUCOSE 113* 104* 127*   Electrolytes  Recent Labs Lab 04/05/14 0420  04/05/14 1905 04/06/14 0500 04/06/14 0811 04/06/14 2022 04/07/14 0425  CALCIUM 8.9  --   --  8.5  --   --  8.7  MG 1.7  < > 1.9  --  1.8 1.6  --   PHOS 4.2  < > 3.2  --  3.3  3.1  --   < > = values in this interval not displayed. Sepsis Markers No results found for this basename: LATICACIDVEN, PROCALCITON, O2SATVEN,  in the last 168 hours ABG  Recent Labs Lab 04/04/14 0505 04/04/14 1814 04/05/14 0326  PHART 7.405 7.456* 7.448  PCO2ART 62.8* 56.8* 50.4*  PO2ART 82.7 382.0* 96.9   Liver Enzymes  Recent Labs Lab 04/04/14 0833 04/07/14 0425  AST 28 26  ALT 10 13  ALKPHOS 58 46  BILITOT 0.4 0.3  ALBUMIN 2.5* 1.9*   Cardiac Enzymes No results found for this basename: TROPONINI, PROBNP,  in the last 168 hours Glucose  Recent Labs Lab 04/06/14 0835 04/06/14 1143 04/06/14 1555 04/06/14 1935 04/07/14 0032 04/07/14 0400  GLUCAP 97 100* 108* 120* 164* 134*    Imaging Chest Portable 1 View To Assess Tube Placement And Rule-out Pneumothorax  04/06/2014   CLINICAL DATA:  Tracheostomy placement  EXAM: PORTABLE CHEST - 1 VIEW  COMPARISON:  April 05, 2014  FINDINGS: There is no a tracheostomy tube present which is well seated. The tip of the tracheostomy is 7.0 cm above the carina. Nasogastric tube tip is in the proximal stomach with the side port in the distal esophagus. Central catheter tip is in the superior vena cava near the cavoatrial junction. No pneumothorax.  There is airspace consolidation in the right lower lobe with small right effusion. Left lung is clear. Heart size and pulmonary vascularity are normal. There is atherosclerotic change in the aorta. No adenopathy.  IMPRESSION: Tube and catheter positions as described. No pneumothorax. Note that the nasogastric tube side port is in the distal esophagus. Advise advancing nasogastric tube approximately 8 to 10 cm to confirm placement in the stomach.  Right lower lobe consolidation with effusion. Lungs elsewhere clear.   Electronically Signed   By: Bretta Bang M.D.   On: 04/06/2014 12:59    ASSESSMENT / PLAN: PULMONARY A: mostly likely aspiration PNA, ARF P:   -no new abg -sbt cpap 5 ps 5 as  goal, did poorly, increase PS -trached, 6 cuff up -continue ceftriaxone -follow rt base infiltrate / atx  CARDIOVASCULAR A:   - A Fib, Hr controlled, 2D echo on 03/19/14 showed EF 55 to 60% without wall motion abnormality.  resolved brady -  P:  - Digoxin 0.125 mg daily, 0.8 level.  - Metoprolol 75 mg bid, may need reduction, add hold parameters for al l3  - Diltiazem 90 mg tid - SCD for DVT ppx - MAp goals 60 -dc cvp  RENAL A:  Mild hypochloriemia and hypoK - brain edema over weeks out from this  P:   -no free water planned - monitor BMP - replete K -goal 15 cc/hr urine - met  -kvo as T F started -replace mag  GASTROINTESTINAL A: No acute issue P:    - IV pepcid -for PEG  HEMATOLOGIC A:  Leukocytosis secondary to aspiration PNA, ICH P:  -coags wnl -scd for now -consider sub q hep, consider soon  INFECTIOUS A: aspiration PNA P:    -Vancomycin and Cefepime - narrow off  - ceftriaxone until 4/12  ENDOCRINE A:  DM-II, A1c 6.1 on 03/19/14. CBG 134 in AM P:   -SSI-senstive  NEUROLOGIC A:   Hx of ischemic stroke. Now acute hemorrhagic conversion of ischemic CVA. Airway compromise -CT-head 4/7: No significant change in her large intracerebral hematoma involving the right temporal and occipital lobes. Mild right to left midline shift, unchanged. -EEG: no epileptic activity  P:   - No anticoagulant -still poor awakeness, will call back neuro, prognosis, MRI?? needed -restarted valproic -eeg reassuring no seziures  Ivor Costa, MD PGY3, Internal Medicine Teaching Service Pager: [phone]  TODAY'S SUMMARY: trach likely, wean  I have personally obtained a history, examined the patient, evaluated laboratory and imaging results, formulated the assessment and plan and placed orders. CRITICAL CARE: The patient is critically ill with multiple organ systems failure and requires high complexity decision making for assessment and support, frequent evaluation and  titration of therapies, application of advanced monitoring technologies and extensive interpretation of multiple databases. Critical Care Time devoted to patient care services described in this note is 30 minutes.   Lavon Paganini. Titus Mould, MD, [place] Pgr: [facility]  Pulmonary and [place] Pager: [phone]  04/07/2014, 7:52 AM

## 2014-04-07 NOTE — Progress Notes (Signed)
Pt cont to have very limited responsiveness at this time.  Pt has no OOB orders and was trached yesterday.  Will give pt one more day to hopefully increase responsiveness/participation and evaluate tomorrow if able.  Spoke to nursing who is in agreement.  Will talk to PT to plan coeval/tx due to complexity of pts diagnosis and safety. Jinger Neighbors, [place] [phone]

## 2014-04-07 NOTE — Progress Notes (Signed)
Patient ID: Colleen [name], female   DOB: Aug 20, 1951, 63 y.o.   MRN: [id number]   Awaiting to perform percutaneous gastric tube  Tmax 100.1 last pm; 99.5 this am Wbc trending down  Will cont to follow chart  When afeb x 48 hrs we will move forward with G Tube Tentatively plan for probably Mon

## 2014-04-08 LAB — GLUCOSE, CAPILLARY
GLUCOSE-CAPILLARY: 128 mg/dL — AB (ref 70–99)
Glucose-Capillary: 135 mg/dL — ABNORMAL HIGH (ref 70–99)
Glucose-Capillary: 154 mg/dL — ABNORMAL HIGH (ref 70–99)
Glucose-Capillary: 161 mg/dL — ABNORMAL HIGH (ref 70–99)
Glucose-Capillary: 124 mg/dL — ABNORMAL HIGH (ref 70–99)

## 2014-04-08 LAB — BASIC METABOLIC PANEL
BUN: 17 mg/dL (ref 6–23)
CO2: 31 meq/L (ref 19–32)
CREATININE: 0.38 mg/dL — AB (ref 0.50–1.10)
Calcium: 8.6 mg/dL (ref 8.4–10.5)
Chloride: 96 mEq/L (ref 96–112)
GFR calc Af Amer: >90 mL/min (ref >90)
GFR calc non Af Amer: >90 mL/min (ref >90)
GLUCOSE: 143 mg/dL — AB (ref 70–99)
POTASSIUM: 3.7 meq/L (ref 3.7–5.3)
Sodium: 138 mEq/L (ref 137–147)

## 2014-04-08 LAB — PHOSPHORUS: Phosphorus: 2.5 mg/dL (ref 2.3–4.6)

## 2014-04-08 LAB — MAGNESIUM: Magnesium: 1.6 mg/dL (ref 1.5–2.5)

## 2014-04-08 MED ORDER — MAGNESIUM SULFATE 40 MG/ML IJ SOLN
2.0000 g | Freq: Once | INTRAMUSCULAR | Status: AC
  Administered 2014-04-08: 2 g via INTRAVENOUS
  Filled 2014-04-08: qty 50

## 2014-04-08 MED ORDER — ACETAMINOPHEN 160 MG/5ML PO SOLN
650.0000 mg | Freq: Four times a day (QID) | ORAL | Status: DC | PRN
  Administered 2014-04-08: 650 mg
  Filled 2014-04-08: qty 20.3

## 2014-04-08 MED ORDER — JEVITY 1.2 CAL PO LIQD
1000.0000 mL | ORAL | Status: DC
  Administered 2014-04-08 – 2014-04-10 (×3): 1000 mL
  Administered 2014-04-12: 16:00:00
  Administered 2014-04-14: 1000 mL
  Filled 2014-04-08 (×12): qty 1000

## 2014-04-08 MED ORDER — FUROSEMIDE 10 MG/ML IJ SOLN
40.0000 mg | Freq: Four times a day (QID) | INTRAMUSCULAR | Status: AC
  Administered 2014-04-08 – 2014-04-09 (×3): 40 mg via INTRAVENOUS
  Filled 2014-04-08 (×3): qty 4

## 2014-04-08 MED ORDER — POTASSIUM PHOSPHATE DIBASIC 3 MMOLE/ML IV SOLN
30.0000 mmol | Freq: Once | INTRAVENOUS | Status: AC
  Administered 2014-04-08: 30 mmol via INTRAVENOUS
  Filled 2014-04-08: qty 10

## 2014-04-08 MED ORDER — PRO-STAT SUGAR FREE PO LIQD
30.0000 mL | Freq: Every day | ORAL | Status: DC
  Administered 2014-04-08 – 2014-04-13 (×5): 30 mL
  Filled 2014-04-08 (×7): qty 30

## 2014-04-08 NOTE — Progress Notes (Signed)
Stroke Team Progress Note  HISTORY 63 y.o. female with a history of recent right occipital infarct on Wednesday 03/16/2014 who was seen at [facility] following a fall where a CT showed pethechial hemorrhage, she was d/c home after stopping her coumadin which she takes for atrial fibrillation. On 03/18/2014 she developed severe headache with nausea and vomiting and was brought to the ED where she had left-sided weakness and was found to have right temporal-occipital region with intraventricular extension into the right lateral ventricle. She was not a TPA candidate secondary to stroke with in the past 90 days and hemorrhage. Neurosurgeon consulted (Dr. Ronnald Ramp) felt no surgical intervention was necessary. She was admitted to the neuro ICU for further treatment and evaluation. She was transfered to inpatient rehabilitation  And stroke team had signed off but the patient returned  Back to ICU with fever, tachypnea and increasing secretions and has required antibiotics and ventilatory support for her respirator failure. She was initially unresponsive and comatose and Dr. Titus Mould requested stroke team to see the patient to see if there was a new neurological change. However since this morning she has been arousable and following commands as per the nurse. SUBJECTIVE No family at bedside. Pt was arousable and followed commands for me. She has trach and is on ventilator OBJECTIVE Most recent Vital Signs: Filed Vitals:   04/08/14 1031 04/08/14 1038 04/08/14 1133 04/08/14 1325  BP:  126/62 132/85 117/60  Pulse: 88 88 83 86  Temp:      TempSrc:   Oral   Resp:   20 23  Height:      Weight:      SpO2:   100% 100%   CBG (last 3)   Recent Labs  04/08/14 0428 04/08/14 0716 04/08/14 1237  GLUCAP 135* 128* 154*    IV Fluid Intake:   . sodium chloride 20 mL/hr at 04/07/14 2000  . feeding supplement (JEVITY 1.2 CAL)    . labetalol (NORMODYNE) infusion      MEDICATIONS  . antiseptic oral rinse   15 mL Mouth Rinse q12n4p  . cefTRIAXone (ROCEPHIN)  IV  1 g Intravenous Q24H  . chlorhexidine  15 mL Mouth Rinse BID  . digoxin  0.125 mg Oral Daily  . diltiazem  90 mg Oral TID PC & HS  . etomidate  40 mg Intravenous Once  . famotidine (PEPCID) IV  20 mg Intravenous BID  . feeding supplement (PRO-STAT SUGAR FREE 64)  30 mL Per Tube Q2000  . furosemide  40 mg Intravenous Q6H  . insulin aspart  0-9 Units Subcutaneous 6 times per day  . magnesium sulfate 1 - 4 g bolus IVPB  2 g Intravenous Once  . methimazole  5 mg Oral Daily  . metoprolol tartrate  75 mg Oral BID  . potassium phosphate IVPB (mmol)  30 mmol Intravenous Once  . Valproic Acid  500 mg Oral TID   PRN:  fentaNYL  Diet:  NPO 1 honey thick liquids, meds crushed in puree Activity:  Up with assistance DVT Prophylaxis:  SCDs  CLINICALLY SIGNIFICANT STUDIES Basic Metabolic Panel:   Recent Labs Lab 04/07/14 0425 04/07/14 0500 04/08/14 0520  NA 138  --  138  K 3.5*  --  3.7  CL 96  --  96  CO2 30  --  31  GLUCOSE 127*  --  143*  BUN 23  --  17  CREATININE 0.45*  --  0.38*  CALCIUM 8.7  --  8.6  MG  --  1.6 1.6  PHOS  --  3.0 2.5   Liver Function Tests:   Recent Labs Lab 04/04/14 0833 04/07/14 0425  AST 28 26  ALT 10 13  ALKPHOS 58 46  BILITOT 0.4 0.3  PROT 7.0 5.7*  ALBUMIN 2.5* 1.9*   CBC:   Recent Labs Lab 04/06/14 0811 04/07/14 0425  WBC 13.2* 11.4*  HGB 10.4* 9.5*  HCT 35.4* 32.5*  MCV 79.0 79.3  PLT 286 248   Coagulation:   Recent Labs Lab 04/05/14 0846 04/06/14 0811  LABPROT 13.8 14.0  INR 1.08 1.10   Cardiac Enzymes: No results found for this basename: CKTOTAL, CKMB, CKMBINDEX, TROPONINI,  in the last 168 hours Urinalysis:   Recent Labs Lab 04/04/14 0358 04/04/14 1219  COLORURINE YELLOW YELLOW  LABSPEC 1.020 1.020  PHURINE 8.5* 7.0  GLUCOSEU NEGATIVE NEGATIVE  HGBUR NEGATIVE TRACE*  BILIRUBINUR NEGATIVE NEGATIVE  KETONESUR NEGATIVE NEGATIVE  PROTEINUR 30* 100*   UROBILINOGEN 1.0 2.0*  NITRITE NEGATIVE NEGATIVE  LEUKOCYTESUR SMALL* NEGATIVE   Lipid Panel    Component Value Date/Time   CHOL 159 03/19/2014 0550   TRIG 134 03/19/2014 0550   HDL 58 03/19/2014 0550   CHOLHDL 2.7 03/19/2014 0550   VLDL 27 03/19/2014 0550   LDLCALC 74 03/19/2014 0550   HgbA1C  Lab Results  Component Value Date   HGBA1C 6.1* 03/19/2014    Urine Drug Screen:     Component Value Date/Time   LABOPIA POSITIVE* 12/07/2013 2131   COCAINSCRNUR NONE DETECTED 12/07/2013 2131   LABBENZ NONE DETECTED 12/07/2013 2131   AMPHETMU NONE DETECTED 12/07/2013 2131   THCU NONE DETECTED 12/07/2013 2131   LABBARB NONE DETECTED 12/07/2013 2131    Alcohol Level: No results found for this basename: ETH,  in the last 168 hours   CT of the brain   03/22/2014 Similar appearance of right temporal parietal 6.5 x 3.4 cm intraparenchymal hemorrhage with intraventricular extension, similar right left midline shift. No rebleed. No hydrocephalus.  03/20/2014    Stable parenchymal hematoma in the right temporo-occipital region, as described above.  Mild surrounding vasogenic edema, extension into the left lateral ventricle, and 4 mm leftward midline shift, grossly unchanged.  Stable cortical effacement/loss of sulcation involving the right cerebral hemisphere, indicating cerebral edema.    03/19/2014    1. No significant interval change in intra-axial hematoma involving the right temporal-occipital region with intraventricular extension into the right lateral ventricle. The intraparenchymal hematoma measures 6.4 x 3.7 cm on today's exam, previously 6.4 x 3.7 cm on 03/18/2014. 2. Similar appearance of diffuse edema involving the right cerebral hemisphere. Similar leftward midline shift of 5 mm. 3. Similar enlargement of the temporal horns of the lateral ventricles bilaterally. No hydrocephalus. Basilar cisterns remain patent. 4. No new intracranial hemorrhage or infarct identified.    03/18/2014   1. Malignant  hemorrhagic transformation of the right occipital lobe infarct first identified by MRI on 03/16/2014. Intra-axial hematoma with estimated volume of 53 mm, plus secondary extension of hemorrhage into the right lateral ventricle. 2. Generalized right hemisphere edema. Leftward midline shift of 5 mm. 3. Mild enlargement of the temporal horns.   2D Echocardiogram  Left ventricle: The cavity size was normal. Wall thickness was normal. Systolic function was normal. The estimated ejection fraction was in the range of 55% to 60%. Wall motion was normal; there were no regional wall motion abnormalities.  Carotid Doppler  03/18/2014 1-39% internal carotid artery stenosis bilaterally. Vertebral arteries are patent  with antegrade flow.  CXR   03/24/2014 Basilar infiltrates versus atelectasis. Further evaluation with PA  and lateral chest radiograph recommended all. Feeding tube appreciated tip projecting in the fundal region of the stomach.  03/18/2014   No edema or consolidation.     EEG 03/22/2014 This EEG is most consistent with a generalized nonspecific cerebral dysfunction. This EEG represents a marked improvement compared to the EEG of yesterday. There was no seizure recorded on this study.  03/21/2014 This recorded 2 likely epileptic seizures addition to a moderate to severe generalized nonspecific cerebral dysfunction. There was no seizure or seizure predisposition recorded on this study.   EKG  A-fib.   Therapy Recommendations CIR  Physical Exam  General: in bed  Resp: non-laboured breathing  CV: Irregular  Abd: NT, ND  Ext: no edema  Mental Status:  Patient is drowsy and can easily be aroused but not following commands well. Will blink eyes and occasionally move right side to command   Cranial Nerves:  II: L hemianopia. Pupils are equal, round, and reactive to light. III,IV, VI: right gaze deviation VII: Facial movement is decreased on left  VIII: hearing is intact to voice  X: Uvula  elevates symmetrically  XII: tongue is midline without atrophy or fasciculations.  Motor:  Tone is normal. Bulk is normal. Moves right side against gravity and light resistance, not noted to move left side against gravity but exam limited due to mental status Asterixis is noted in the right hand Sensory:  Decreased withdrawal to noxious stimuli on the left Deep Tendon Reflexes:  2+ and symmetric in the biceps and patellae.  Cerebellar:  UTO Gait:  Not assessed    ASSESSMENT Ms. Colleen [name] is a 63 y.o. female with hemorrhagic conversion of an ischemic infarct that occurred 03/16/2014. Imaging confirms a right temporo-occipital region with intraventricular extension and cerebral edema with leftward midline shift. She had been on warfarin (now on hold), INR on arrival is 2.61. It was reversed with anticoagulation reversal protocol down to 1.04. Previous infarct felt to be embolic secondary to atrial fibrillation. Had been on coumadin in the past, now on hold d/t hemorrhage. Patient with resultant lethargy that seemed out of proportion to size of hemorrhage - EEG revealed non-convulsive status improved on Depacon, level 70.5 on 04/01/14 acceptable. Patient with resulting dysarthria, dysphagia and left hemiparesis. No change in neuro status.  New altered mental status likely multifactorial due to combination of respiratory failure, hypoxia, medication effect and slow recovery from large stroke Atrial fibrillation - recurred in setting of TIA, placed on coumadin with plans for lifelong treatment, currently held 2/2 d/t ICH. On cardizem and lopressort Hypertension Hyperlipidemia, LDL 74, on lipitor 20 mg daily PTA, now on lipitor 20 md daily, goal LDL < 70 for diabetics Diabetes Type 2, HgbA1c 6.1, goal < 7.0 Decreased UOP, started on IVF, improved Hyponatremia, 135 Hx stroke - 2009 stroke and TIA, 11/2013 TIA w/ afib Tachycardia Hospital acquired pneumonia  Sepsis with enterobacter UTI - txd w/  Zosyn and vancomycin.  Neurosurgery consult Ronnald Ramp) seen and signed off.  Hospital day # 4  TREATMENT/PLAN  No further neurological test at the present time.  Continue VPA 500mg  TID. May change to syrup once POs are tolerated. Cannot do pill as it cannot be crushed.  Disposition: IP Rehab, they are following.  Discussed with Dr. Titus Mould  Nothing further to add from the stroke standpoint  Ongoing risk factor control by Primary Care Physician  Stroke Service  will sign off. Please call should any needs arise.  Follow up with Dr. Leonie Man, [facility], in 2 months.     Antony Contras, MD 04/08/2014 1:53 PM     To contact Stroke Continuity provider, please refer to [email or web address] After hours, contact General Neurology

## 2014-04-08 NOTE — Evaluation (Signed)
Occupational Therapy Evaluation Patient Details Name: Colleen [name] MRN: [id number] DOB: 1951-04-21 Today's Date: 04/08/2014    History of Present Illness Colleen [name] is a 63 y.o. female with a history of stroke on 3/18 coumadin stopped for some pethechial hemorrhage associated with the hemorrhage. Pt with R temporo-occipital hemorrhage. DC from [facility] to CIR 3/31 with return 4/6 due to fever, tachypnea, and pleural effusion, llethargic. Intubated 4/6 with trach 4/8   Clinical Impression   This 63 yo female admitted with above presents to acute OT with prior to stroke being Independent and now with decreased cognition, decreased mobility, decreased use of BUEs, decreased balance, decreased vision all affecting pt's ability to care for herself. Will benefit from acute OT with follow up at SNF to increase independence and decrease burden of care    Follow Up Recommendations  SNF    Equipment Recommendations  3 in 1 bedside comode       Precautions / Restrictions Precautions Precautions: Fall Precaution Comments: decreased arousal, NPO and Panda tube, flexiseal, vent/trach Restrictions Weight Bearing Restrictions: No      Mobility Bed Mobility Overal bed mobility: Needs Assistance;+ 2 for safety/equipment;+2 for physical assistance Bed Mobility: Supine to Sit;Sit to Supine     Supine to sit: Total assist;+2 for physical assistance;+2 for safety/equipment;HOB elevated Sit to supine: +2 for physical assistance;+2 for safety/equipment;Total assist   General bed mobility comments: max multimodal cues with use of pad and HOB elevated to pivot pt to EOB with full trunk support EOB. Return to bed into sidely then roll to supine pt without pushing or initiation to attempt assist  Transfers                 General transfer comment: did not attempt standing due to lethargy and gross weakness    Balance Overall balance assessment: Needs assistance Sitting-balance support: Bilateral  upper extremity supported;Feet supported Sitting balance-Leahy Scale: Zero Sitting balance - Comments: Pt EOB 15 min with max assist for balance with varied assist with holding head upright grossly 10sec at a time sporadically. Pt with grimace and slight shrug with painful stimuli at upper trap in sitting Postural control: Posterior lean                                  ADL                                         General ADL Comments: Pt currently total A for all BADLs bed level or seated EOB     Vision                 Additional Comments: When pt first opened her eyes both eyes were midline superior, over time they came to midline center, then they went to right gaze preference and then finally at just off of midline to the right. I did get pt x2 to look at me further to the left and a trace of movement when asking her to look up and down while I moved up and down.          Pertinent Vitals/Pain HR 83-113, otherwise no noticeable changes        Extremity/Trunk Assessment Upper Extremity Assessment Upper Extremity Assessment: RUE deficits/detail;LUE deficits/detail RUE Deficits / Details: moved spontaneously x1, grip of hand  x1 on command with delay (CANNOT say that she let go on command with increased time); localized withdrawl reponse to nail bed pressure. In sitting pt with increased tone--causing humeral head to push into glenoid fossa and causing decreased ability to perform full PROM at shoulder--also noted increased tone at elbow while seated EOB--postur of RUE while seated is elevation and internal rotation. Edema noted in both UEs RUE Coordination: decreased fine motor;decreased gross motor LUE Deficits / Details: localized withdrawl reponse to nail bed pressure; 2 finger subluxation LUE Coordination: decreased fine motor;decreased gross motor   Lower Extremity Assessment Lower Extremity Assessment: RLE deficits/detail;LLE  deficits/detail RLE Deficits / Details: pt with 2-/5 strength noted spontaneously for dorsiflexion and hip/knee flexion but no AROM on command. ROM WFL LLE Deficits / Details: flaccid with withdrawl to pain, ROM WFL       Communication Communication Communication: Tracheostomy   Cognition Arousal/Alertness: Lethargic   Overall Cognitive Status: Impaired/Different from baseline Area of Impairment: Attention               General Comments: pt lethargic with difficulty maintaining arousal without painful stimuli              Home Living Family/patient expects to be discharged to:: Skilled nursing facility Living Arrangements: Spouse/significant other;Children Available Help at Discharge: Personal care attendant;Available 24 hours/day;Family Type of Home: House Home Access: Stairs to enter [employer] of Steps: 4 Entrance Stairs-Rails: None Home Layout: One level                   Additional Comments: PTA pt's CNA and/or family assisted patient with Rollator to a walk in shower with sliding doors and onto a shower chair without back.  Lives With: Spouse;Daughter;Other (Comment)    Prior Functioning/Environment          Comments: O2 at 2 liters at home pta, CAPS worker and family assisted patient with all BADL tasks. Daughter reports she was told that her mom had "sundowners" except she reports the behavior is all of the time. - noted from CIR admission    OT Diagnosis: Generalized weakness;Cognitive deficits;Disturbance of vision;Hemiplegia non-dominant side   OT Problem List: Decreased strength;Decreased range of motion;Decreased activity tolerance;Impaired balance (sitting and/or standing);Decreased cognition;Decreased safety awareness;Decreased coordination;Impaired vision/perception;Decreased knowledge of use of DME or AE;Impaired tone;Impaired UE functional use;Increased edema   OT Treatment/Interventions: Self-care/ADL training;Therapeutic  exercise;DME and/or AE instruction;Cognitive remediation/compensation;Therapeutic activities;Visual/perceptual remediation/compensation;Patient/family education;Balance training    OT Goals(Current goals can be found in the care plan section) Acute Rehab OT Goals Patient Stated Goal: Unable to state due to trach and decreased cogntion OT Goal Formulation: Patient unable to participate in goal setting Time For Goal Achievement: 04/22/14 Potential to Achieve Goals: Good  OT Frequency: Min 2X/week   Barriers to D/C: Decreased caregiver support          Co-evaluation PT/OT/SLP Co-Evaluation/Treatment: Yes Reason for Co-Treatment: Complexity of the patient's impairments (multi-system involvement) PT goals addressed during session: Mobility/safety with mobility;Balance OT goals addressed during session: ADL's and self-care;Strengthening/ROM      End of Session    Activity Tolerance: Patient limited by lethargy Patient left: in bed;with restraints reapplied   Time: 6144-3154 OT Time Calculation (min): 31 min Charges:  OT General Charges $OT Visit: 1 Procedure OT Evaluation $Initial OT Evaluation Tier I: 1 Procedure OT Treatments $Therapeutic Activity: 8-22 mins  Almon Register [id number] 04/08/2014, 10:48 AM

## 2014-04-08 NOTE — Progress Notes (Signed)
PULMONARY / CRITICAL CARE MEDICINE   Name: ANAILY ASHBAUGH MRN: [id number] DOB: 07/28/1951    ADMISSION DATE:  04/04/2014 CONSULTATION DATE:  4/6 BRIEF PATIENT DESCRIPTION:   Colleen [name] is a 63 y.o. Caucasian female with history of COPD, type 2 diabetes, hypertension, atrial fibrillation, and recent right occipital infarct.   She was on Coumadin which was discontinued due to petechial hemorrhage as shown on CT-head on 03/16/14. The patient was recently admitted to ICU and treated for HCAP. Patient was placed on a feeding tube for nutrition on 03/28/2012. She was subsequently discharged to inpatient rehabilitation. She has continued Zosy Patient has also been on valproate which is therapeutic per valproic acid level test on 04/01/14. The patient developed low-grade fevers which has been progressively worsening. She had a fever of 103 and subsequently became tachypnea. Patient was noticed to have copious amount of secretions. The patient was admitted to [facility]. She is comatose status and can not protect her airway and therefore transferred to ICU.  SIGNIFICANT EVENTS / STUDIES:   4/3- Stable hematoma in the right temporal and occipital lobes with subfalcine herniation. Stable lateral ventricular size with trace hemorrhage in the left   lateral ventricle.  4/6-CXR, moderate right-sided pleural effusion, with associated airspace opacity. 4/6-CT-head: No significant change in her large intracerebral hematoma involving the right temporal and occipital lobes. Mild right to left midline shift, unchanged. 4/7-EEG: no epileptic activity 4/8-Had several episodes of bradycardia with stable blood pressure 4/8-trach placed  LINES / TUBES: 4/6-ETT>> 4/6-CVL>> 4/8-trach (DF)>>  CULTURES: 4/6- respiratory culture>>negative 4/6- blood culture>> 4/6- Urine culture>>negative  ANTIBIOTICS: 3/26- Zosyn>>4/5 4/6-Vancomycin>>>4/8 4/6-Cefpeme>>4/8 4/8 ceftriaxone>>>stop 4/12  SUBJECTIVE:  Attempted PS trials  this AM, desat down to the 60's, bradycardia as well.  VITAL SIGNS: Temp:  [99.1 F (37.3 C)-101.2 F (38.4 C)] 99.6 F (37.6 C) (04/10 0838) Pulse Rate:  [28-113] 83 (04/10 1133) Resp:  [14-32] 20 (04/10 1133) BP: (123-186)/(49-85) 132/85 mmHg (04/10 1133) SpO2:  [95 %-100 %] 100 % (04/10 1133) FiO2 (%):  [30 %-40 %] 40 % (04/10 0949) Weight:  [162 lb 11.2 oz (73.8 kg)] 162 lb 11.2 oz (73.8 kg) (04/10 0200) HEMODYNAMICS:   VENTILATOR SETTINGS: Vent Mode:  [-] PRVC FiO2 (%):  [30 %-40 %] 40 % Set Rate:  [12 bmp] 12 bmp Vt Set:  [450 mL] 450 mL PEEP:  [5 cmH20] 5 cmH20 Plateau Pressure:  [15 cmH20-20 cmH20] 20 cmH20 INTAKE / OUTPUT: Intake/Output     04/09 0701 - 04/10 0700 04/10 0701 - 04/11 0700   I.V. (mL/kg) 300 (4.1)    Other 30    NG/GT 1062.4    IV Piggyback 400 100   Total Intake(mL/kg) 1792.4 (24.3) 100 (1.4)   Urine (mL/kg/hr) 700 (0.4) 280 (0.8)   Total Output 700 280   Net +1092.4 -180        Stool Occurrence 3 x      PHYSICAL EXAMINATION:  General: the patient is trached and unresponsive.  Neuro: unresponsive  HEENT: PERRL   Cardiovascular: irregularly irregular rhythm  Lungs: Rales bilaterally, improved Abdomen: soft, distended. Bowel sound present  Musculoskeletal: moves toes to pain stimulus on both sidwe  Skin: no rashes.  LABS:  CBC  Recent Labs Lab 04/05/14 0420 04/06/14 0811 04/07/14 0425  WBC 12.4* 13.2* 11.4*  HGB 11.0* 10.4* 9.5*  HCT 37.0 35.4* 32.5*  PLT 298 286 248   Coag's  Recent Labs Lab 04/05/14 0846 04/06/14 0811 04/06/14 0815  APTT 28  --  25  INR 1.08 1.10  --    BMET  Recent Labs Lab 04/06/14 0500 04/07/14 0425 04/08/14 0520  NA 139 138 138  K 3.8 3.5* 3.7  CL 97 96 96  CO2 30 30 31   BUN 21 23 17   CREATININE 0.47* 0.45* 0.38*  GLUCOSE 104* 127* 143*   Electrolytes  Recent Labs Lab 04/06/14 0500  04/06/14 2022 04/07/14 0425 04/07/14 0500 04/08/14 0520  CALCIUM 8.5  --   --  8.7  --  8.6   MG  --   < > 1.6  --  1.6 1.6  PHOS  --   < > 3.1  --  3.0 2.5  < > = values in this interval not displayed. Sepsis Markers No results found for this basename: LATICACIDVEN, PROCALCITON, O2SATVEN,  in the last 168 hours ABG  Recent Labs Lab 04/04/14 0505 04/04/14 1814 04/05/14 0326  PHART 7.405 7.456* 7.448  PCO2ART 62.8* 56.8* 50.4*  PO2ART 82.7 382.0* 96.9   Liver Enzymes  Recent Labs Lab 04/04/14 0833 04/07/14 0425  AST 28 26  ALT 10 13  ALKPHOS 58 46  BILITOT 0.4 0.3  ALBUMIN 2.5* 1.9*   Cardiac Enzymes No results found for this basename: TROPONINI, PROBNP,  in the last 168 hours Glucose  Recent Labs Lab 04/07/14 1133 04/07/14 1556 04/07/14 2018 04/07/14 2357 04/08/14 0428 04/08/14 0716  GLUCAP 146* 109* 125* 109* 135* 128*    Imaging No results found.  ASSESSMENT / PLAN: PULMONARY A: mostly likely aspiration PNA, ARF P:   - Reattempt PS trials today. - Trached, 6 cuff up - Continue ceftriaxone - Follow rt base infiltrate / atx  CARDIOVASCULAR A:   - A Fib, Hr controlled, 2D echo on 03/19/14 showed EF 55 to 60% without wall motion abnormality.  resolved brady -  P:  - Digoxin 0.125 mg daily, 0.8 level.  - Metoprolol 75 mg bid, may need reduction, add hold parameters for al l3  - Diltiazem 90 mg tid - SCD for DVT ppx - MAP goals 60 - D/C cvp  RENAL A:  Mild hypochloriemia and hypoK - brain edema over weeks out from this  P:   - No free water planned - Monitor BMP - Replete K - KVO as T F started - Replace mag and phos - Lasix 40 mg IV q6 x3 doses.  GASTROINTESTINAL A: No acute issue P:    - IV pepcid - For PEG  HEMATOLOGIC A:  Leukocytosis secondary to aspiration PNA, ICH P:  - Coags wnl - SCD for now - Consider sub q hep, consider soon  INFECTIOUS A: aspiration PNA P:    - Vancomycin and Cefepime - narrow off  - Ceftriaxone until 4/12  ENDOCRINE A:  DM-II, A1c 6.1 on 03/19/14. CBG 134 in AM P:   -  SSI-senstive  NEUROLOGIC A:   Hx of ischemic stroke. Now acute hemorrhagic conversion of ischemic CVA. Airway compromise - CT-head 4/7: No significant change in her large intracerebral hematoma involving the right temporal and occipital lobes. Mild right to left midline shift, unchanged. - EEG: no epileptic activity.  P:   - No anticoagulant - Still poor awakeness, will call back neuro, prognosis, MRI?? needed - Restarted valproic - EEG reassuring no seziures - Stop all sedation.  TODAY'S SUMMARY: Lurline Idol in place, continue weaning attempts, active diureses today, no anticoag, spoke with family, informed that only place for her to go is in [place] and brought in case Freight forwarder to  speak with family regarding placement.  I do not foresee patient liberating from vent.  I have personally obtained a history, examined the patient, evaluated laboratory and imaging results, formulated the assessment and plan and placed orders.  CRITICAL CARE: The patient is critically ill with multiple organ systems failure and requires high complexity decision making for assessment and support, frequent evaluation and titration of therapies, application of advanced monitoring technologies and extensive interpretation of multiple databases. Critical Care Time devoted to patient care services described in this note is 30 minutes.   Rush Farmer, M.D. [facility] Pulmonary/Critical Care Medicine. Pager: [phone]. After hours pager: [phone].

## 2014-04-08 NOTE — Evaluation (Signed)
Physical Therapy Evaluation Patient Details Name: MAKYNZI EASTLAND MRN: [id number] DOB: 1951-07-03 Today's Date: 04/08/2014   History of Present Illness  DANESHIA TAVANO is a 63 y.o. female with a history of stroke on 3/18 coumadin stopped for some pethechial hemorrhage associated with the hemorrhage. Pt with R temporo-occipital hemorrhage. DC from [facility] to CIR 3/31 with return 4/6 due to fever, tachypnea, and pleural effusion, llethargic. Intubated 4/6 with trach 4/8  Clinical Impression  Pt with limited arousal and interaction throughout evaluation. Pt with eyes closed throughout supine interaction and upon transfers to sitting opened eyes with bilaterally upward gaze with eventual transition to midline but not tracking. Pt with one period of looking toward LLE when moved but did not focus on leg or therapist. Pt with one period of repositioning RLE in sitting with plantarflexion and hip flexion but would not duplicate on command. Pt also with weak grip on command on RUE. Pt with some increased visual response when transitioned with OT in front of pt and PT at back for support. Pt continues to be limited by lethargy, weakness and cardiopulmonary status and would benefit from SNF at DC as well as continued acute therapy to maximize function, balance and activity tolerance.     Follow Up Recommendations SNF;Supervision/Assistance - 24 hour    Equipment Recommendations       Recommendations for Other Services       Precautions / Restrictions Precautions Precautions: Fall Precaution Comments: decreased arousal, NPO and Panda tube, flexiseal, vent/trach Restrictions Weight Bearing Restrictions: No      Mobility  Bed Mobility Overal bed mobility: Needs Assistance;+ 2 for safety/equipment;+2 for physical assistance Bed Mobility: Supine to Sit;Sit to Supine     Supine to sit: Total assist;+2 for physical assistance;+2 for safety/equipment;HOB elevated Sit to supine: +2 for physical assistance;+2  for safety/equipment;Total assist   General bed mobility comments: max multimodal cues with use of pad and HOB elevated to pivot pt to EOB with full trunk support EOB. Return to bed into sidely then roll to supine pt without pushing or initiation to attempt assist  Transfers                 General transfer comment: did not attempt standing due to lethargy and gross weakness  Ambulation/Gait                Stairs            Wheelchair Mobility    Modified Rankin (Stroke Patients Only) Modified Rankin (Stroke Patients Only) Pre-Morbid Rankin Score: Severe disability Modified Rankin: Severe disability     Balance Overall balance assessment: Needs assistance Sitting-balance support: Bilateral upper extremity supported;Feet supported Sitting balance-Leahy Scale: Zero Sitting balance - Comments: Pt EOB 15 min with max assist for balance with varied assist with holding head upright grossly 10sec at a time sporadically. Pt with grimace and slight shrug with painful stimuli at upper trap in sitting Postural control: Posterior lean                                   Pertinent Vitals/Pain HR 83-113 with activity sats 100% on PRVC 40%, peep 5 Grimace and withdrawal to painful stimuli nailbed pressure all extremities    Home Living Family/patient expects to be discharged to:: Skilled nursing facility Living Arrangements: Spouse/significant other;Children Available Help at Discharge: Personal care attendant;Available 24 hours/day;Family Type of Home: House Home Access: Stairs  to enter Entrance Stairs-Rails: None Entrance Stairs-Number of Steps: 4 Home Layout: One level   Additional Comments: PTA pt's CNA and/or family assisted patient with Rollator to a walk in shower with sliding doors and onto a shower chair without back.    Prior Function           Comments: O2 at 2 liters at home pta, CAPS worker and family assisted patient with all BADL  tasks. Daughter reports she was told that her mom had "sundowners" except she reports the behavior is all of the time. - noted from CIR admission     Hand Dominance        Extremity/Trunk Assessment   Upper Extremity Assessment: Defer to OT evaluation           Lower Extremity Assessment: RLE deficits/detail;LLE deficits/detail RLE Deficits / Details: pt with 2-/5 strength noted spontaneously for dorsiflexion and hip/knee flexion but no AROM on command. ROM WFL LLE Deficits / Details: flaccid with withdrawl to pain, ROM WFL     Communication   Communication: Tracheostomy  Cognition Arousal/Alertness: Lethargic   Overall Cognitive Status: Impaired/Different from baseline Area of Impairment: Attention               General Comments: pt lethargic with difficulty maintaining arousal without painful stimuli    General Comments      Exercises General Exercises - Lower Extremity Long Arc Quad: PROM;Right;5 reps;Seated Heel Slides: PROM;Both;10 reps;Supine Toe Raises: PROM;Both;5 reps;Supine      Assessment/Plan    PT Assessment Patient needs continued PT services  PT Diagnosis Difficulty walking;Hemiplegia non-dominant side;Generalized weakness;Altered mental status   PT Problem List Decreased strength;Decreased activity tolerance;Impaired tone;Decreased safety awareness;Decreased cognition;Decreased mobility;Decreased balance;Cardiopulmonary status limiting activity  PT Treatment Interventions DME instruction;Functional mobility training;Therapeutic activities;Therapeutic exercise;Balance training;Patient/family education;Cognitive remediation;Neuromuscular re-education   PT Goals (Current goals can be found in the Care Plan section) Acute Rehab PT Goals PT Goal Formulation: Patient unable to participate in goal setting Time For Goal Achievement: 04/22/14 Potential to Achieve Goals: Fair    Frequency Min 2X/week   Barriers to discharge Decreased caregiver  support;Inaccessible home environment      Co-evaluation PT/OT/SLP Co-Evaluation/Treatment: Yes Reason for Co-Treatment: Complexity of the patient's impairments (multi-system involvement);For patient/therapist safety PT goals addressed during session: Mobility/safety with mobility;Balance         End of Session   Activity Tolerance: Patient limited by fatigue Patient left: in bed;with call bell/phone within reach Nurse Communication: Mobility status;Need for lift equipment         Time: 0912-0941 PT Time Calculation (min): 29 min   Charges:   PT Evaluation $Initial PT Evaluation Tier I: 1 Procedure PT Treatments $Therapeutic Activity: 8-22 mins   PT G Codes:          Luvinia Lucy B Niaja Stickley 04/08/2014, 9:58 AM Elwyn Reach, [place]

## 2014-04-08 NOTE — Op Note (Signed)
NAME:  Colleen [name], Colleen [name]                  ACCOUNT NO.:  [id number]  MEDICAL RECORD NO.:  [id number]  LOCATION:                                 FACILITY:  PHYSICIAN:  Raylene Miyamoto, MD DATE OF BIRTH:  Nov 20, 1951  DATE OF PROCEDURE:  04/06/2014 DATE OF DISCHARGE:                              OPERATIVE REPORT   PROCEDURE:  Percutaneous tracheostomy.  INDICATIONS FOR PROCEDURE:  The patient was consented from the patient's husband and all family members for risks and benefits of procedure including infection, bleeding, pneumothorax, and death.  PREOPERATIVE DIAGNOSIS:  Hemorrhagic conversion of the stroke with aspiration pneumonia and poor airway protection and respiratory failure.  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  DESCRIPTION OF PROCEDURE:  The patient was placed in supine position. Chlorhexidine preparation was used to sterilize the operative site.  The bronchoscopist for this procedure was Dr. Doree Fudge. After Chlorhexidine preparation, 7 mL of lidocaine plus epinephrine was injected over the operative site over the second endotracheal space. The endotracheal tube was backed up under direct visual guidance by the bronchoscope to approximately 17 cm.  A 1.1-cm vertical incision was made.  Dissection was made down the tracheal planes, I was able to identify the strap muscle easily and dissect between them.  The 18-gauge needle was placed through the second endotracheal space directly visualized by the bronchoscopist without any posterior wall injury.  A wire was placed through the white catheter sheath after the needle was removed.  Then, a 14-French punch dilator was placed over the wire into the airway.  Then, a progressive rhino dilator was placed in and out of the airway over a glider.  A tracheostomy Shiley #6/26-French dilator was placed over the glider wire successfully into the  airway. Everything was removed except for the tracheostomy.  Tracheostomy was sutured in place with 4-0 monofilament sutures.  Bronchoscopist placed a bronchoscope through the new tracheostomy tube near the carina approximately 4 cm below without any posterior wall injury or active bleeding.  Blood loss for the procedure was 5 mL.  The postoperative chest x-ray revealed a well placed tracheostomy tube placement.  This patient is to follow up in our Percutaneous Tracheostomy Clinic by calling [phone].     Raylene Miyamoto, MD   ______________________________ Raylene Miyamoto, MD    DJF/MEDQ  D:  04/07/2014  T:  04/08/2014  Job:  [id number]  cc:   Pramod P. Leonie Man, MD

## 2014-04-08 NOTE — Progress Notes (Signed)
Text page to DR Zubelivitsky to inform that pt has spiked a temp of 102.9 call back and orders received

## 2014-04-08 NOTE — Progress Notes (Addendum)
Clinical Social Work Department CLINICAL SOCIAL WORK PLACEMENT NOTE 04/08/2014  Patient:  Colleen [name], Colleen [name]  Account Number:  [id number] Admit date:  04/04/2014  Clinical Social Worker:  Ky Barban, Latanya Presser  Date/time:  04/08/2014 01:17 PM  Clinical Social Work is seeking post-discharge placement for this patient at the following level of care:   [facility]   (*CSW will update this form in Epic as items are completed)   N/A-vent SNF referrals made  Patient/family provided with [facility] Department of Clinical Social Work's list of facilities offering this level of care within the geographic area requested by the patient (or if unable, by the patient's family).  04/08/14  Patient/family informed of their freedom to choose among providers that offer the needed level of care, that participate in Medicare, Medicaid or managed care program needed by the patient, have an available bed and are willing to accept the patient.  N/A-pt on vent  Patient/family informed of [facility]' ownership interest in [facility] as well as of the fact that they are under no obligation to receive care at this facility.  PASARR submitted to EDS on  PASARR number received from [facility] on   FL2 transmitted to all facilities in geographic area requested by pt/family on   FL2 transmitted to all facilities within larger geographic area on   Patient informed that his/her managed care company has contracts with or will negotiate with  certain facilities, including the following:     Patient/family informed of bed offers received:  04/14/14 Patient chooses bed at [facility] vent SNF Physician recommends and patient chooses bed at    Patient to be transferred to [facility] vent SNF on  04/14/14 Patient to be transferred to facility by [facility]  The following physician request were entered in Epic:   Additional Comments: Met with pt's family in room. Spoke with daughter, son, and pt's husband.  Explained barriers to placement, as pt is Medicaid only. Daughter asked about placement in [facility], and referral has been faxed to [facility] in [facility] left voicemail with admissions rep Stanton Kidney (fax: [phone]; phone: [phone]). Referral made to [facility] rehab in [place], [facility] in [place], and [facility] in [place]. [facility] does not take Medicaid only, so referral not made.  Addendum: [facility] vent SNF in [place] able to offer pt a bed. [facility] scheduled for 1pm. Informed family they will need to switch Medicaid over to long-term care benefit, family aware and state they will do this. Facility informed of transport for today. Discharge packet placed on pt's chart. CSW signing off.   Ky Barban, MSW, [facility] Clinical Social Worker [phone]

## 2014-04-08 NOTE — Progress Notes (Signed)
NUTRITION FOLLOW UP  DOCUMENTATION CODES  Per approved criteria   -Severe malnutrition in the context of chronic illness    Intervention:    Change TF to Jevity 1.2 at 55 with Prostat 30 ml once daily to provide 1684 kcals, 88 gm protein, 1069 ml free water daily  Nutrition Dx:   Inadequate oral intake related to inability to eat as evidenced by NPO status.   Goal:   Intake to meet >90% of estimated nutrition needs. Met.  Monitor:   TF tolerance/adequacy, weight trend, labs, respiratory status.  Assessment:   PMHx significant for recent CVA, GERD, IBS, HTN, diverticulosis. Admitted with worsening L sided weakness. Work-up reveals hemorrhagic conversion of an ischemic infarct.  Patient transferred from rehab unit to MICU on 4/6. Required intubation. S/P trach on 4/8. Plans to place PEG once patient is afebrile x 48 hours, possibly Monday. Currently receiving TF via NGT: Vital AF 1.2 at 55 ml/h is providing 1584 kcals, 99 gm protein, 1070 ml free water daily. Patient is appropriate to transition to a standard TF formula; will change to Jevity 1.2.   Patient remains on ventilator support via trach.  MV: 12.4 L/min Temp (24hrs), Avg:100 F (37.8 C), Min:99.1 F (37.3 C), Max:101.2 F (38.4 C)   Height: Ht Readings from Last 1 Encounters:  04/04/14 _0  (1.651 m)    Weight Status:  Increased with positive fluid status Wt Readings from Last 1 Encounters:  04/08/14 162 lb 11.2 oz (73.8 kg)  04/04/14  139 lb 8.8 oz (63.3 kg)   Re-estimated needs:  Kcal: 1730 Protein: 85-100 gm Fluid: > 1.8 L  Skin: no wounds  Diet Order: NPO   Intake/Output Summary (Last 24 hours) at 04/08/14 0948 Last data filed at 04/08/14 0400  Gross per 24 hour  Intake 1452.4 ml  Output    500 ml  Net  952.4 ml    Last BM: 4/9   Labs:   Recent Labs Lab 04/06/14 0500  04/06/14 2022 04/07/14 0425 04/07/14 0500 04/08/14 0520  NA 139  --   --  138  --  138  K 3.8  --   --  3.5*  --   3.7  CL 97  --   --  96  --  96  CO2 30  --   --  30  --  31  BUN 21  --   --  23  --  17  CREATININE 0.47*  --   --  0.45*  --  0.38*  CALCIUM 8.5  --   --  8.7  --  8.6  MG  --   < > 1.6  --  1.6 1.6  PHOS  --   < > 3.1  --  3.0 2.5  GLUCOSE 104*  --   --  127*  --  143*  < > = values in this interval not displayed.  CBG (last 3)   Recent Labs  04/07/14 2357 04/08/14 0428 04/08/14 0716  GLUCAP 109* 135* 128*    Scheduled Meds: . antiseptic oral rinse  15 mL Mouth Rinse q12n4p  . cefTRIAXone (ROCEPHIN)  IV  1 g Intravenous Q24H  . chlorhexidine  15 mL Mouth Rinse BID  . digoxin  0.125 mg Oral Daily  . diltiazem  90 mg Oral TID PC & HS  . etomidate  40 mg Intravenous Once  . etomidate  40 mg Intravenous Once  . famotidine (PEPCID) IV  20 mg Intravenous BID  .  insulin aspart  0-9 Units Subcutaneous 6 times per day  . methimazole  5 mg Oral Daily  . metoprolol tartrate  75 mg Oral BID  . Valproic Acid  500 mg Oral TID    Continuous Infusions: . sodium chloride 20 mL/hr at 04/07/14 2000  . feeding supplement (VITAL AF 1.2 CAL) 1,000 mL (04/08/14 0600)  . labetalol (NORMODYNE) infusion       Molli Barrows, RD, LDN, [place] Pager [phone] After Hours Pager [phone]

## 2014-04-09 DIAGNOSIS — E43 Unspecified severe protein-calorie malnutrition: Secondary | ICD-10-CM

## 2014-04-09 DIAGNOSIS — J690 Pneumonitis due to inhalation of food and vomit: Secondary | ICD-10-CM

## 2014-04-09 LAB — CBC
HCT: 29.6 % — ABNORMAL LOW (ref 36.0–46.0)
Hemoglobin: 9 g/dL — ABNORMAL LOW (ref 12.0–15.0)
MCH: 23.8 pg — AB (ref 26.0–34.0)
MCHC: 30.4 g/dL (ref 30.0–36.0)
MCV: 78.3 fL (ref 78.0–100.0)
PLATELETS: 218 10*3/uL (ref 150–400)
RBC: 3.78 MIL/uL — ABNORMAL LOW (ref 3.87–5.11)
RDW: 21.7 % — AB (ref 11.5–15.5)
WBC: 13.4 10*3/uL — AB (ref 4.0–10.5)

## 2014-04-09 LAB — BASIC METABOLIC PANEL
BUN: 18 mg/dL (ref 6–23)
CALCIUM: 8.6 mg/dL (ref 8.4–10.5)
CHLORIDE: 92 meq/L — AB (ref 96–112)
CO2: 36 mEq/L — ABNORMAL HIGH (ref 19–32)
CREATININE: 0.44 mg/dL — AB (ref 0.50–1.10)
GFR calc non Af Amer: >90 mL/min (ref >90)
Glucose, Bld: 147 mg/dL — ABNORMAL HIGH (ref 70–99)
Potassium: 3.7 mEq/L (ref 3.7–5.3)
Sodium: 138 mEq/L (ref 137–147)

## 2014-04-09 LAB — GLUCOSE, CAPILLARY
GLUCOSE-CAPILLARY: 142 mg/dL — AB (ref 70–99)
GLUCOSE-CAPILLARY: 144 mg/dL — AB (ref 70–99)
Glucose-Capillary: 117 mg/dL — ABNORMAL HIGH (ref 70–99)
Glucose-Capillary: 128 mg/dL — ABNORMAL HIGH (ref 70–99)
Glucose-Capillary: 145 mg/dL — ABNORMAL HIGH (ref 70–99)

## 2014-04-09 LAB — PHOSPHORUS: PHOSPHORUS: 3.8 mg/dL (ref 2.3–4.6)

## 2014-04-09 LAB — MAGNESIUM: Magnesium: 1.7 mg/dL (ref 1.5–2.5)

## 2014-04-09 MED ORDER — HEPARIN SODIUM (PORCINE) 5000 UNIT/ML IJ SOLN
5000.0000 [IU] | Freq: Three times a day (TID) | INTRAMUSCULAR | Status: DC
  Administered 2014-04-09 – 2014-04-14 (×13): 5000 [IU] via SUBCUTANEOUS
  Filled 2014-04-09 (×18): qty 1

## 2014-04-09 MED ORDER — FAMOTIDINE 40 MG/5ML PO SUSR
20.0000 mg | Freq: Every day | ORAL | Status: DC
  Administered 2014-04-09 – 2014-04-14 (×6): 20 mg
  Filled 2014-04-09 (×7): qty 2.5

## 2014-04-09 MED ORDER — LEVOFLOXACIN 25 MG/ML PO SOLN
500.0000 mg | Freq: Every day | ORAL | Status: AC
  Administered 2014-04-09 – 2014-04-10 (×2): 500 mg via ORAL
  Filled 2014-04-09 (×2): qty 20

## 2014-04-09 MED ORDER — LEVOFLOXACIN 25 MG/ML PO SOLN: 500.0000 mg | Freq: Every day | ORAL | Status: DC

## 2014-04-09 NOTE — Progress Notes (Signed)
PULMONARY / CRITICAL CARE MEDICINE   Name: Colleen [name] MRN: [id number] DOB: 26-Jul-1951    ADMISSION DATE:  04/04/2014 CONSULTATION DATE:  4/6 BRIEF PATIENT DESCRIPTION:   Colleen [name] is a 63 y.o. Caucasian female with history of COPD, type 2 diabetes, hypertension, atrial fibrillation, and recent right occipital infarct.   SIGNIFICANT EVENTS / STUDIES:   4/3- Stable hematoma in the right temporal and occipital lobes with subfalcine herniation. Stable lateral ventricular size with trace hemorrhage in the left   lateral ventricle.  4/6-CXR, moderate right-sided pleural effusion, with associated airspace opacity. 4/6-CT-head: No significant change in her large intracerebral hematoma involving the right temporal and occipital lobes. Mild right to left midline shift, unchanged. 4/7-EEG: no epileptic activity 4/8-Had several episodes of bradycardia with stable blood pressure 4/8-trach placed  LINES / TUBES: 4/6-ETT>>4/8 4/6-CVL>> 4/8-trach (DF)>>  CULTURES: 4/6- respiratory culture>>negative 4/6- blood culture>>neg 4/6- Urine culture>>negative 4/9 c diff >>NEG  ANTIBIOTICS: 3/26- Zosyn>>4/5 4/6-Vancomycin>>>4/8 4/6-Cefpeme>>4/8 4/8 ceftriaxone>>>4/11 4/11 levaquin per tube >> Stop 4/12  SUBJECTIVE:  tol PSV better.  ?LTAC referral   VITAL SIGNS: Temp:  [98.5 F (36.9 C)-102.9 F (39.4 C)] 99 F (37.2 C) (04/11 0800) Pulse Rate:  [27-149] 69 (04/11 0800) Resp:  [16-30] 21 (04/11 0800) BP: (96-158)/(57-85) 129/78 mmHg (04/11 0800) SpO2:  [98 %-100 %] 98 % (04/11 0800) FiO2 (%):  [40 %] 40 % (04/11 0800) Weight:  [73.5 kg (162 lb 0.6 oz)] 73.5 kg (162 lb 0.6 oz) (04/11 0100) HEMODYNAMICS: cv stable    VENTILATOR SETTINGS: Vent Mode:  [-] CPAP;PSV FiO2 (%):  [40 %] 40 % Set Rate:  [12 bmp] 12 bmp Vt Set:  [450 mL] 450 mL PEEP:  [5 cmH20] 5 cmH20 Pressure Support:  [10 cmH20] 10 cmH20 Plateau Pressure:  [15 cmH20-18 cmH20] 15 cmH20 INTAKE / OUTPUT: Intake/Output      04/10 0701 - 04/11 0700 04/11 0701 - 04/12 0700   I.V. (mL/kg)     Other     NG/GT 1420 55   IV Piggyback 575    Total Intake(mL/kg) 1995 (27.1) 55 (0.7)   Urine (mL/kg/hr) 2155 (1.2)    Stool 100 (0.1)    Total Output 2255     Net -260 +55          PHYSICAL EXAMINATION:  General: the patient is trached  Neuro: more alert HEENT: PERRL   Cardiovascular: irregularly irregular rhythm  Lungs: Rales bilaterally, improved Abdomen: soft, distended. Bowel sound present  Musculoskeletal: moves toes to pain stimulus on both sidwe  Skin: no rashes.  LABS:  CBC  Recent Labs Lab 04/06/14 0811 04/07/14 0425 04/09/14 0500  WBC 13.2* 11.4* 13.4*  HGB 10.4* 9.5* 9.0*  HCT 35.4* 32.5* 29.6*  PLT 286 248 218   Coag's  Recent Labs Lab 04/05/14 0846 04/06/14 0811 04/06/14 0815  APTT 28  --  25  INR 1.08 1.10  --    BMET  Recent Labs Lab 04/07/14 0425 04/08/14 0520 04/09/14 0500  NA 138 138 138  K 3.5* 3.7 3.7  CL 96 96 92*  CO2 30 31 36*  BUN 23 17 18   CREATININE 0.45* 0.38* 0.44*  GLUCOSE 127* 143* 147*   Electrolytes  Recent Labs Lab 04/07/14 0425 04/07/14 0500 04/08/14 0520 04/09/14 0500  CALCIUM 8.7  --  8.6 8.6  MG  --  1.6 1.6 1.7  PHOS  --  3.0 2.5 3.8   Sepsis Markers No results found for this basename: LATICACIDVEN, PROCALCITON,  O2SATVEN,  in the last 168 hours ABG  Recent Labs Lab 04/04/14 0505 04/04/14 1814 04/05/14 0326  PHART 7.405 7.456* 7.448  PCO2ART 62.8* 56.8* 50.4*  PO2ART 82.7 382.0* 96.9   Liver Enzymes  Recent Labs Lab 04/04/14 0833 04/07/14 0425  AST 28 26  ALT 10 13  ALKPHOS 58 46  BILITOT 0.4 0.3  ALBUMIN 2.5* 1.9*   Cardiac Enzymes No results found for this basename: TROPONINI, PROBNP,  in the last 168 hours Glucose  Recent Labs Lab 04/08/14 1237 04/08/14 1723 04/08/14 2108 04/09/14 0019 04/09/14 0615 04/09/14 0745  GLUCAP 154* 161* 124* 117* 145* 128*    Imaging No results  found.  ASSESSMENT / PLAN: PULMONARY A: mostly likely aspiration PNA, ARF P:   - Reattempt PS trials today. - finish rx PNA with levaquin x 24hrs per tube then d/c  CARDIOVASCULAR A:   - A Fib, Hr controlled, 2D echo on 03/19/14 showed EF 55 to 60% without wall motion abnormality.  resolved brady -  P:  - Digoxin 0.125 mg daily, 0.8 level.  - Metoprolol 75 mg bid, - Diltiazem 90 mg tid - SCD for DVT ppx - MAP goals 60   RENAL A:  Mild hypochloriemia and hypoK>>improved - brain edema over weeks out from this  P:   - No free water planned - Monitor BMP - Replete K prn  - KVO as T F started   GASTROINTESTINAL A: No acute issue P:    - IV pepcid>>change to PER tube - For PEG  HEMATOLOGIC A:  Leukocytosis secondary to aspiration PNA, ICH P:  - Coags wnl - SCD for now - start sq hep  INFECTIOUS A: aspiration PNA P:    - levaquin 4/11>>4/12  ENDOCRINE A:  DM-II, A1c 6.1 on 03/19/14. CBG 134 in AM P:   - SSI-senstive  NEUROLOGIC A:   Hx of ischemic stroke. Now acute hemorrhagic conversion of ischemic CVA. Airway compromise - CT-head 4/7: No significant change in her large intracerebral hematoma involving the right temporal and occipital lobes. Mild right to left midline shift, unchanged. - EEG: no epileptic activity.  P:   - cont  valproic   TODAY'S SUMMARY: Trach in place, continue weaning attempts, active diureses today, no anticoag, spoke with family, informed that only place for her to go is in [place] and brought in case manager to speak with family regarding placement.  I do not foresee patient liberating from vent.>>Make sure LTAC pursued  I have personally obtained a history, examined the patient, evaluated laboratory and imaging results, formulated the assessment and plan and placed orders.  CRITICAL CARE: The patient is critically ill with multiple organ systems failure and requires high complexity decision making for assessment and support,  frequent evaluation and titration of therapies, application of advanced monitoring technologies and extensive interpretation of multiple databases. Critical Care Time devoted to patient care services described in this note is 30 minutes.   Burnett Harry WrightMD Beeper  [phone]  Cell  [phone]  If no response or cell goes to voicemail, call beeper [phone] 04/09/2014 9:51 AM

## 2014-04-10 ENCOUNTER — Encounter (HOSPITAL_COMMUNITY): Payer: Self-pay | Admitting: Radiology

## 2014-04-10 LAB — BASIC METABOLIC PANEL
BUN: 18 mg/dL (ref 6–23)
CALCIUM: 9.4 mg/dL (ref 8.4–10.5)
CO2: 35 mEq/L — ABNORMAL HIGH (ref 19–32)
Chloride: 94 mEq/L — ABNORMAL LOW (ref 96–112)
Creatinine, Ser: 0.37 mg/dL — ABNORMAL LOW (ref 0.50–1.10)
GLUCOSE: 132 mg/dL — AB (ref 70–99)
POTASSIUM: 3.9 meq/L (ref 3.7–5.3)
Sodium: 140 mEq/L (ref 137–147)

## 2014-04-10 LAB — CULTURE, BLOOD (ROUTINE X 2)
Culture: NO GROWTH
Culture: NO GROWTH

## 2014-04-10 LAB — CBC
HCT: 32.6 % — ABNORMAL LOW (ref 36.0–46.0)
HEMOGLOBIN: 9.5 g/dL — AB (ref 12.0–15.0)
MCH: 23.6 pg — AB (ref 26.0–34.0)
MCHC: 29.1 g/dL — ABNORMAL LOW (ref 30.0–36.0)
MCV: 80.9 fL (ref 78.0–100.0)
PLATELETS: 251 10*3/uL (ref 150–400)
RBC: 4.03 MIL/uL (ref 3.87–5.11)
RDW: 21.4 % — ABNORMAL HIGH (ref 11.5–15.5)
WBC: 11.4 10*3/uL — AB (ref 4.0–10.5)

## 2014-04-10 LAB — GLUCOSE, CAPILLARY
GLUCOSE-CAPILLARY: 119 mg/dL — AB (ref 70–99)
GLUCOSE-CAPILLARY: 146 mg/dL — AB (ref 70–99)
GLUCOSE-CAPILLARY: 159 mg/dL — AB (ref 70–99)
Glucose-Capillary: 137 mg/dL — ABNORMAL HIGH (ref 70–99)
Glucose-Capillary: 151 mg/dL — ABNORMAL HIGH (ref 70–99)
Glucose-Capillary: 152 mg/dL — ABNORMAL HIGH (ref 70–99)
Glucose-Capillary: 81 mg/dL (ref 70–99)

## 2014-04-10 NOTE — Progress Notes (Addendum)
Patient has been afebrile and wbc trending down, if continues to be afebrile and wbc trend down, will proceed 4/13, consent in chart, updated airway performed, on antibiotics, orders placed for G-tube, check KUB in am.  Tsosie Billing PA-C Interventional Radiology  04/10/14  11:37 AM

## 2014-04-10 NOTE — Progress Notes (Signed)
PULMONARY / CRITICAL CARE MEDICINE   Name: Colleen [name] MRN: [id number] DOB: 03/28/1951    ADMISSION DATE:  04/04/2014 CONSULTATION DATE:  4/6 BRIEF PATIENT DESCRIPTION:   Colleen [name] is a 63 y.o. Caucasian female with history of COPD, type 2 diabetes, hypertension, atrial fibrillation, and recent right occipital infarct.   SIGNIFICANT EVENTS / STUDIES:   4/3- Stable hematoma in the right temporal and occipital lobes with subfalcine herniation. Stable lateral ventricular size with trace hemorrhage in the left   lateral ventricle.  4/6-CXR, moderate right-sided pleural effusion, with associated airspace opacity. 4/6-CT-head: No significant change in her large intracerebral hematoma involving the right temporal and occipital lobes. Mild right to left midline shift, unchanged. 4/7-EEG: no epileptic activity 4/8-Had several episodes of bradycardia with stable blood pressure 4/8-trach placed  LINES / TUBES: 4/6-ETT>>4/8 4/6-CVL>> 4/8-trach (DF)>>  CULTURES: 4/6- respiratory culture>>negative 4/6- blood culture>>neg 4/6- Urine culture>>negative 4/9 c diff >>NEG  ANTIBIOTICS: 3/26- Zosyn>>4/5 4/6-Vancomycin>>>4/8 4/6-Cefpeme>>4/8 4/8 ceftriaxone>>>4/11 4/11 levaquin per tube >> Stop 4/12  SUBJECTIVE:  On psv    VITAL SIGNS: Temp:  [98.2 F (36.8 C)-99.5 F (37.5 C)] 99.5 F (37.5 C) (04/12 0800) Pulse Rate:  [48-125] 83 (04/12 0800) Resp:  [11-36] 16 (04/12 0800) BP: (107-180)/(56-87) 140/76 mmHg (04/12 0800) SpO2:  [99 %-100 %] 99 % (04/12 0800) FiO2 (%):  [40 %] 40 % (04/12 0412) Weight:  [73.3 kg (161 lb 9.6 oz)] 73.3 kg (161 lb 9.6 oz) (04/12 0500) HEMODYNAMICS: cv stable    VENTILATOR SETTINGS: Vent Mode:  [-] PRVC FiO2 (%):  [40 %] 40 % Set Rate:  [12 bmp] 12 bmp Vt Set:  [450 mL] 450 mL PEEP:  [5 cmH20] 5 cmH20 Plateau Pressure:  [14 cmH20-16 cmH20] 15 cmH20 INTAKE / OUTPUT: Intake/Output     04/11 0701 - 04/12 0700 04/12 0701 - 04/13 0700   NG/GT 1320  55   IV Piggyback     Total Intake(mL/kg) 1320 (18) 55 (0.8)   Urine (mL/kg/hr)     Stool     Total Output       Net +1320 +55          PHYSICAL EXAMINATION:  General: the patient is trached  Neuro: more alert HEENT: PERRL   Cardiovascular: irregularly irregular rhythm  Lungs: Rales bilaterally, improved Abdomen: soft, distended. Bowel sound present  Musculoskeletal: moves toes to pain stimulus on both sidwe  Skin: no rashes.  LABS:  CBC  Recent Labs Lab 04/07/14 0425 04/09/14 0500 04/10/14 0430  WBC 11.4* 13.4* 11.4*  HGB 9.5* 9.0* 9.5*  HCT 32.5* 29.6* 32.6*  PLT 248 218 251   Coag's  Recent Labs Lab 04/05/14 0846 04/06/14 0811 04/06/14 0815  APTT 28  --  25  INR 1.08 1.10  --    BMET  Recent Labs Lab 04/08/14 0520 04/09/14 0500 04/10/14 0430  NA 138 138 140  K 3.7 3.7 3.9  CL 96 92* 94*  CO2 31 36* 35*  BUN 17 18 18   CREATININE 0.38* 0.44* 0.37*  GLUCOSE 143* 147* 132*   Electrolytes  Recent Labs Lab 04/07/14 0500 04/08/14 0520 04/09/14 0500 04/10/14 0430  CALCIUM  --  8.6 8.6 9.4  MG 1.6 1.6 1.7  --   PHOS 3.0 2.5 3.8  --    Sepsis Markers No results found for this basename: LATICACIDVEN, PROCALCITON, O2SATVEN,  in the last 168 hours ABG  Recent Labs Lab 04/04/14 0505 04/04/14 1814 04/05/14 0326  PHART 7.405 7.456*  7.448  PCO2ART 62.8* 56.8* 50.4*  PO2ART 82.7 382.0* 96.9   Liver Enzymes  Recent Labs Lab 04/04/14 0833 04/07/14 0425  AST 28 26  ALT 10 13  ALKPHOS 58 46  BILITOT 0.4 0.3  ALBUMIN 2.5* 1.9*   Cardiac Enzymes No results found for this basename: TROPONINI, PROBNP,  in the last 168 hours Glucose  Recent Labs Lab 04/09/14 1307 04/09/14 1711 04/09/14 2045 04/10/14 0001 04/10/14 0415 04/10/14 0830  GLUCAP 142* 144* 146* 159* 137* 151*    Imaging No results found.  ASSESSMENT / PLAN: PULMONARY A: mostly likely aspiration PNA, ARF P:   - Reattempt PS trials today. -LTAC or vent snf  referral  CARDIOVASCULAR A:   - A Fib, Hr controlled, 2D echo on 03/19/14 showed EF 55 to 60% without wall motion abnormality.  resolved brady -  P:  - Digoxin 0.125 mg daily - Metoprolol 75 mg bid, - Diltiazem 90 mg tid - SCD for DVT ppx - MAP goals 60   RENAL A:  Mild hypochloriemia and hypoK>>improved  P:   - No free water planned - Monitor BMP - Replete K prn    GASTROINTESTINAL A: No acute issue P:    - H2 blocker per tube - For PEG  HEMATOLOGIC A:  Leukocytosis secondary to aspiration PNA, ICH P:  - Coags wnl - SCD for now - cont sq hep  INFECTIOUS A: aspiration PNA P:    - d/c abx  ENDOCRINE A:  DM-II, A1c 6.1 on 03/19/14. CBG 134 in AM P:   - SSI-senstive  NEUROLOGIC A:   Hx of ischemic stroke. Now acute hemorrhagic conversion of ischemic CVA. Airway compromise - CT-head 4/7: No significant change in her large intracerebral hematoma involving the right temporal and occipital lobes. Mild right to left midline shift, unchanged. - EEG: no epileptic activity.  P:   - cont  valproic   TODAY'S SUMMARY: Trach in place, continue weaning attempts, active diureses today, no anticoag, spoke with family, informed that only place for her to go is in [place] and brought in case manager to speak with family regarding placement.  I do not foresee patient liberating from vent.>>Make sure LTAC pursued  I have personally obtained a history, examined the patient, evaluated laboratory and imaging results, formulated the assessment and plan and placed orders.  CRITICAL CARE: The patient is critically ill with multiple organ systems failure and requires high complexity decision making for assessment and support, frequent evaluation and titration of therapies, application of advanced monitoring technologies and extensive interpretation of multiple databases. Critical Care Time devoted to patient care services described in this note is 30 minutes.   Burnett Harry  WrightMD Beeper  [phone]  Cell  [phone]  If no response or cell goes to voicemail, call beeper [phone] 04/10/2014 9:50 AM

## 2014-04-11 ENCOUNTER — Inpatient Hospital Stay (HOSPITAL_COMMUNITY): Payer: Medicaid Other

## 2014-04-11 LAB — CULTURE, BLOOD (ROUTINE X 2)
CULTURE: NO GROWTH
Culture: NO GROWTH

## 2014-04-11 LAB — GLUCOSE, CAPILLARY
GLUCOSE-CAPILLARY: 124 mg/dL — AB (ref 70–99)
GLUCOSE-CAPILLARY: 149 mg/dL — AB (ref 70–99)
GLUCOSE-CAPILLARY: 62 mg/dL — AB (ref 70–99)
Glucose-Capillary: 95 mg/dL (ref 70–99)
Glucose-Capillary: 101 mg/dL — ABNORMAL HIGH (ref 70–99)
Glucose-Capillary: 100 mg/dL — ABNORMAL HIGH (ref 70–99)
Glucose-Capillary: 80 mg/dL (ref 70–99)

## 2014-04-11 MED ORDER — FENTANYL CITRATE 0.05 MG/ML IJ SOLN
INTRAMUSCULAR | Status: AC | PRN
  Administered 2014-04-11: 50 ug via INTRAVENOUS

## 2014-04-11 MED ORDER — DEXTROSE 50 % IV SOLN: 25.0000 mL | Freq: Once | INTRAVENOUS | Status: AC | PRN

## 2014-04-11 MED ORDER — IOHEXOL 300 MG/ML  SOLN
50.0000 mL | Freq: Once | INTRAMUSCULAR | Status: AC | PRN
  Administered 2014-04-11: 15 mL

## 2014-04-11 MED ORDER — FENTANYL CITRATE 0.05 MG/ML IJ SOLN
INTRAMUSCULAR | Status: AC
  Filled 2014-04-11: qty 2

## 2014-04-11 MED ORDER — METOPROLOL TARTRATE 1 MG/ML IV SOLN
2.5000 mg | INTRAVENOUS | Status: DC | PRN
  Administered 2014-04-12 (×2): 5 mg via INTRAVENOUS
  Filled 2014-04-11 (×2): qty 5

## 2014-04-11 MED ORDER — MIDAZOLAM HCL 2 MG/2ML IJ SOLN
INTRAMUSCULAR | Status: AC
  Filled 2014-04-11: qty 4

## 2014-04-11 MED ORDER — GLUCAGON HCL (RDNA) 1 MG IJ SOLR
INTRAMUSCULAR | Status: AC | PRN
  Administered 2014-04-11: 1 mg via INTRAVENOUS

## 2014-04-11 MED ORDER — MIDAZOLAM HCL 2 MG/2ML IJ SOLN
INTRAMUSCULAR | Status: AC | PRN
  Administered 2014-04-11: 2 mg via INTRAVENOUS

## 2014-04-11 MED ORDER — METOPROLOL TARTRATE 1 MG/ML IV SOLN
5.0000 mg | Freq: Four times a day (QID) | INTRAVENOUS | Status: DC
Start: 2014-04-11 — End: 2014-04-12
  Administered 2014-04-11 (×2): 5 mg via INTRAVENOUS
  Filled 2014-04-11 (×6): qty 5

## 2014-04-11 MED ORDER — DEXTROSE 50 % IV SOLN
INTRAVENOUS | Status: AC
  Administered 2014-04-11: 25 mL
  Filled 2014-04-11: qty 50

## 2014-04-11 MED ORDER — GLUCAGON HCL (RDNA) 1 MG IJ SOLR
INTRAMUSCULAR | Status: AC
  Filled 2014-04-11: qty 1

## 2014-04-11 NOTE — Procedures (Signed)
Interventional Radiology Procedure Note  Procedure: Placement of percutaneous 80F pull-through gastrostomy tube. Complications: None Recommendations: - NPO except for sips and chips remainder of today and overnight - Maintain G-tube to LWS until tomorrow morning  - May advance diet as tolerated and begin using tube tomorrow at 13:30  Signed,  Criselda Peaches, MD [facility] Rad[facility]

## 2014-04-11 NOTE — Progress Notes (Signed)
04/11/2014 per Velna Hatchet (NP) (critical care) hold po meds for now [facility] RN.

## 2014-04-11 NOTE — Sedation Documentation (Signed)
Patient denies pain and is resting comfortably.  

## 2014-04-11 NOTE — Progress Notes (Signed)
Hypoglycemic Event   CBG: 62  Treatment: D50 IV 25 mL  Symptoms: None  Follow-up CBG: Time:1734 CBG Result:124  Possible Reasons for Event: Inadequate meal intake (NPO)  Comments/MD notified:    Rosalyn Gess  Remember to initiate Hypoglycemia Order Set & complete

## 2014-04-11 NOTE — Progress Notes (Signed)
PULMONARY / CRITICAL CARE MEDICINE   Name: Colleen [name] MRN: [id number] DOB: 12/11/1951    ADMISSION DATE:  04/04/2014 CONSULTATION DATE:  4/6 BRIEF PATIENT DESCRIPTION:   Brief Hx:  63 y.o. Caucasian female with history of COPD, type 2 diabetes, hypertension, atrial fibrillation, and recent right occipital infarct.   SIGNIFICANT EVENTS / STUDIES:   3/18 - R occipital infarct > petechial hemorrhage, d/c from [facility] without coumadin 3/20 - Seen at [facility] with HA, N/V, L weakness, CT with hemorrhagic conversion of R infarct.  Rx for HCAP.  3/30 - d/c to CIR 4/03 -  Stable hematoma in the right temporal and occipital lobes with subfalcine herniation. Stable lateral ventricular size with trace hemorrhage in the left   lateral ventricle.  ........................................................................................................................................ 4/06 - Return to [facility] with fevers, increased secretionsCXR, moderate right-sided pleural effusion, with associated airspace opacity. 4/06 - CT head >> No significant change in lg ICH involving the right temporal and occipital lobes. Mild R to L midline shift, unchanged. 4/07 - EEG: no epileptic activity 4/08 - Had several episodes of bradycardia with stable blood pressure 4/08 - trach placed 4/12 - weaned on PSV 4-5 hours  LINES / TUBES: ETT4/6>>4/8 CVL 4/6>>> Trach (DF) 4/8>>> G-Tube 4/13>>>  CULTURES: 4/6 respiratory culture>>>negative 4/6 blood culture>>>neg 4/6 Urine culture>>>negative 4/9 C diff >>>NEG  ANTIBIOTICS: Zosyn 3/26>>4/5 Vancomycin 4/6>>>4/8 Cefpeme 4/6>>>4/8 Ceftriaxone 4/8>>>4/11 Levaquin per tube 4/11>>>Stop 4/12  SUBJECTIVE: Sleepy post PEG tube placement, weaned 4-5 hours on PSV yesterday  VITAL SIGNS: Temp:  [98.3 F (36.8 C)-98.9 F (37.2 C)] 98.8 F (37.1 C) (04/13 1140) Pulse Rate:  [75-106] 84 (04/13 1321) Resp:  [12-44] 17 (04/13 1321) BP: (107-176)/(44-103) 137/67  mmHg (04/13 1321) SpO2:  [96 %-100 %] 100 % (04/13 1321) FiO2 (%):  [40 %] 40 % (04/13 1227) Weight:  [164 lb 7.4 oz (74.6 kg)] 164 lb 7.4 oz (74.6 kg) (04/13 0425)  VENTILATOR SETTINGS: Vent Mode:  [-] PRVC FiO2 (%):  [40 %] 40 % Set Rate:  [12 bmp] 12 bmp Vt Set:  [450 mL] 450 mL PEEP:  [5 cmH20] 5 cmH20 Pressure Support:  [10 cmH20-14 cmH20] 10 cmH20 Plateau Pressure:  [15 cmH20-17 cmH20] 17 cmH20  INTAKE / OUTPUT: Intake/Output     04/12 0701 - 04/13 0700 04/13 0701 - 04/14 0700   NG/GT 55    Total Intake(mL/kg) 55 (0.7)    Net +55            PHYSICAL EXAMINATION: General: chronically ill F in NAD Neuro: sedate post PEG  HEENT: PERRL   Cardiovascular: irregularly irregular rhythm, rate controlled Lungs: resp's even/non-labored, lungs bilaterally clear  Abdomen: soft, distended. Bowel sound present  Musculoskeletal: spontaneous mov't of RUE  Skin: no rashes.  LABS:  CBC  Recent Labs Lab 04/07/14 0425 04/09/14 0500 04/10/14 0430  WBC 11.4* 13.4* 11.4*  HGB 9.5* 9.0* 9.5*  HCT 32.5* 29.6* 32.6*  PLT 248 218 251   Coag's  Recent Labs Lab 04/05/14 0846 04/06/14 0811 04/06/14 0815  APTT 28  --  25  INR 1.08 1.10  --    BMET  Recent Labs Lab 04/08/14 0520 04/09/14 0500 04/10/14 0430  NA 138 138 140  K 3.7 3.7 3.9  CL 96 92* 94*  CO2 31 36* 35*  BUN 17 18 18   CREATININE 0.38* 0.44* 0.37*  GLUCOSE 143* 147* 132*   Electrolytes  Recent Labs Lab 04/07/14 0500 04/08/14 0520 04/09/14 0500 04/10/14 0430  CALCIUM  --  8.6 8.6 9.4  MG 1.6 1.6 1.7  --   PHOS 3.0 2.5 3.8  --    Liver Enzymes  Recent Labs Lab 04/07/14 0425  AST 26  ALT 13  ALKPHOS 46  BILITOT 0.3  ALBUMIN 1.9*   Imaging Ir Gastrostomy Tube Mod Sed  04/11/2014   CLINICAL DATA:  63 year old female with large intraparenchymal hemorrhagic stroke complicated by dysphagia and aspiration pneumonia. Percutaneous gastrostomy tube is warranted for enteral feeding.  EXAM: PERC  PLACEMENT GASTROSTOMY  Date: 04/11/2014  PROCEDURE: 1. Fluoroscopically guided placement of percutaneous pull-through gastrostomy tube. Interventional Radiologist:  Criselda Peaches, MD  ANESTHESIA/SEDATION: Moderate (conscious) sedation was used. Two mg Versed, 50 mcg Fentanyl were administered intravenously. The patient's vital signs were monitored continuously by radiology nursing throughout the procedure.  Additionally, 1 mg glucagon was administered intravenously.  Sedation Time: 8 minutes  FLUOROSCOPY TIME:  2 minutes 6 seconds  CONTRAST:  18mL OMNIPAQUE IOHEXOL 300 MG/ML  SOLN  MEDICATIONS: Ancef was administered intravenously within 1 hour of skin incision.  TECHNIQUE: Informed consent was obtained from the patient following explanation of the procedure, risks, benefits and alternatives. The patient understands, agrees and consents for the procedure. All questions were addressed. A time out was performed.  Maximal barrier sterile technique utilized including caps, mask, sterile gowns, sterile gloves, large sterile drape, hand hygiene, and chlorhexadine skin prep.  An angled catheter was advanced over a wire under fluoroscopic guidance through the nose, down the esophagus and into the body of the stomach. The stomach was then insufflated with several 100 ml of air. Fluoroscopy confirmed location of the gastric bubble, as well as inferior displacement of the barium stained colon. Under direct fluoroscopic guidance, a single T-tack was placed, and the anterior gastric wall drawn up against the anterior abdominal wall. Percutaneous access was then obtained into the mid gastric body with an 18 gauge sheath needle. Aspiration of air, and injection of contrast material under fluoroscopy confirmed needle placement.  An Amplatz wire was advanced in the gastric body and the access needle exchanged for a 9-French vascular sheath. A snare device was advanced through the vascular sheath and an Amplatz wire advanced  through the angled catheter. The Amplatz wire was successfully snared and this was pulled up through the esophagus and out the mouth. A 20-French Alinda Dooms MIC-PEG tube was then connected to the snare and pulled through the mouth, down the esophagus, into the stomach and out to the anterior abdominal wall. Hand injection of contrast material confirmed intragastric location. The T-tack retention suture was then cut. The pull through peg tube was then secured with the external bumper and capped.  The patient will be observed for several hours with the newly placed tube on low wall suction to evaluate for any post procedure complication. The patient tolerated the procedure well, there is no immediate complication.  IMPRESSION: Successful placement of a 20 French pull through gastrostomy tube.  Signed,  Criselda Peaches, MD  [facility]  [facility]   Electronically Signed   By: Jacqulynn Cadet M.D.   On: 04/11/2014 13:55   Dg Abd Portable 1v  04/11/2014   CLINICAL DATA:  Pre gastrostomy tube placement.  EXAM: PORTABLE ABDOMEN - 1 VIEW  COMPARISON:  04/04/2014  FINDINGS: The NG tube tip is in the descending duodenum. There is opacification of colon with oral contrast material. A right pleural effusion is noted. There is overlying atelectasis.  IMPRESSION: The NG tube tip  is in the descending duodenum.  Opacified colon.  Small right pleural effusion.   Electronically Signed   By: Kalman Jewels M.D.   On: 04/11/2014 07:37    ASSESSMENT / PLAN: PULMONARY A:  Aspiration PNA Acute Respiratory Failure  P:   - Daily PS trials, progress to ATC if tolerated (likely she will need vent SNF) - Vent SNF referral - Wean oxygen for sats > 92%  CARDIOVASCULAR A:   A Fib - HR controlled, 2D echo on 03/19/14 showed EF 55 to 60% without wall motion abnormality.  Bradycardia - resolved  P:  - Digoxin 0.125 mg daily - Metoprolol 75 mg bid, Diltiazem 90 mg tid -  SCD for DVT ppx - 4/13 for PEG > will hold po meds, use IV metop overnight  RENAL A:   Hypochloriemia  Hypokalemia  P:   - Monitor BMP - Replete electrolytes as indicated   GASTROINTESTINAL A:  Dysphagia  P:    - H2 blocker per tube - PEG placement 4/13, NPO  HEMATOLOGIC A:   Leukocytosis secondary to aspiration PNA, ICH P:  - Coags wnl - SCD  - cont sq hep  INFECTIOUS A:  Aspiration PNA P:    - off abx, monitor fever curve / leukocytosis  ENDOCRINE A:   DM-II - A1c 6.1 P:   - sensitive SSI  NEUROLOGIC A:    CVA - Hx of ischemic stroke w acute hemorrhagic conversion of ischemic CVA. CT-head 4/7: No significant change in her large intracerebral hematoma involving the right temporal and occipital lobes. Mild right to left midline shift, unchanged.  EEG: no epileptic activity. P:   - cont valproic acid  GLOBAL: -DNR  -will need vent SNF ([facility] will not take medicaid)   Noe Gens, NP-C [facility] Pgr: [phone] or [phone]  Attending:  Weaning slowly, not quite ready for ATC PEG today  I have personally obtained a history, examined the patient, evaluated laboratory and imaging results, formulated the assessment and plan and placed orders.  CRITICAL CARE: The patient is critically ill with multiple organ systems failure and requires high complexity decision making for assessment and support, frequent evaluation and titration of therapies, application of advanced monitoring technologies and extensive interpretation of multiple databases. Critical Care Time devoted to patient care services described in this note is 35 minutes.   Roselie Awkward, MD [place] [facility] Pager: [phone] Cell: [phone] If no response, call [phone]    2:00 PM

## 2014-04-11 NOTE — Progress Notes (Signed)
Occupational Therapy Treatment Patient Details Name: Colleen [name] MRN: [id number] DOB: [birth year] Today's Date: 04/11/2014    History of present illness Colleen [name] is a 63 y.o. female with a history of stroke on 3/18 coumadin stopped for some pethechial hemorrhage associated with the hemorrhage. Pt with R temporo-occipital hemorrhage. DC from [facility] to CIR 3/31 with return 4/6 due to fever, tachypnea, and pleural effusion, llethargic. Intubated 4/6 with trach 4/8   OT comments  This 63 yo female making progress. Pt was able to accomplish all 3 goals today, just not sure of consistency as of yet, so will wait at least 1-2 more sessions before revising goals.   Follow Up Recommendations  SNF    Equipment Recommendations  3 in 1 bedside comode       Precautions / Restrictions Precautions Precautions: Fall Precaution Comments: decreased arousal, NPO and Panda tube, flexiseal, vent/trach Restrictions Weight Bearing Restrictions: No       Mobility Bed Mobility Overal bed mobility: Needs Assistance;+2 for physical assistance Bed Mobility: Supine to Sit;Rolling;Sit to Supine Rolling: +2 for physical assistance;Total assist   Supine to sit: Total assist;+2 for physical assistance;HOB elevated Sit to supine: Total assist;+2 for physical assistance           Balance Overall balance assessment: Needs assistance Sitting-balance support: Single extremity supported;Feet supported Sitting balance-Leahy Scale: Zero Sitting balance - Comments: Pt sat EOB about 20 minutes working on head/eye turns and coming down on right elbow and then up to midline (pt needed a tactile cue to initiate from down on right elbow back up to midline) x 2 trials--back up to midline with min A with tech also behind her as well helping to keep her forward.Pt able to hold head up almost to neutral the entire session (slightly flexed and to the right)                           ADL Overall ADL's : Needs  assistance/impaired                                       General ADL Comments: Pt currently total A for all BADLs bed level or seated EOB      Vision                 Additional Comments: While seated EOB, had pt work on looking head left, right, and up as well as turnign eyes to look as well. She was able to do this with 50% accuracy with increased time and gestural cues          Cognition   Behavior During Therapy: Restless;Flat affect Overall Cognitive Status: Impaired/Different from baseline Area of Impairment: Attention;Following commands;Safety/judgement;Problem solving   Current Attention Level: Sustained    Following Commands: Follows one step commands inconsistently;Follows one step commands with increased time Safety/Judgement: Decreased awareness of safety;Decreased awareness of deficits Awareness: Intellectual Problem Solving: Decreased initiation                            Frequency Min 2X/week     Progress Toward Goals  OT Goals(current goals can now be found in the care plan section)  Progress towards OT goals: Progressing toward goals     Plan Discharge plan remains appropriate  Activity Tolerance Patient tolerated treatment well   Patient Left in bed;with call bell/phone within reach;with bed alarm set;with restraints reapplied           Time: 1059-1131 OT Time Calculation (min): 32 min  Charges: OT General Charges $OT Visit: 1 Procedure OT Treatments $Therapeutic Activity: 23-37 mins  Almon Register [phone] 04/11/2014, 1:49 PM

## 2014-04-12 ENCOUNTER — Inpatient Hospital Stay (HOSPITAL_COMMUNITY): Payer: Medicaid Other

## 2014-04-12 LAB — CBC
HEMATOCRIT: 33.3 % — AB (ref 36.0–46.0)
Hemoglobin: 9.9 g/dL — ABNORMAL LOW (ref 12.0–15.0)
MCH: 23.4 pg — ABNORMAL LOW (ref 26.0–34.0)
MCHC: 29.7 g/dL — AB (ref 30.0–36.0)
MCV: 78.7 fL (ref 78.0–100.0)
PLATELETS: 288 10*3/uL (ref 150–400)
RBC: 4.23 MIL/uL (ref 3.87–5.11)
RDW: 21 % — AB (ref 11.5–15.5)
WBC: 12 10*3/uL — AB (ref 4.0–10.5)

## 2014-04-12 LAB — GLUCOSE, CAPILLARY
GLUCOSE-CAPILLARY: 99 mg/dL (ref 70–99)
GLUCOSE-CAPILLARY: 120 mg/dL — AB (ref 70–99)
GLUCOSE-CAPILLARY: 139 mg/dL — AB (ref 70–99)
Glucose-Capillary: 115 mg/dL — ABNORMAL HIGH (ref 70–99)
Glucose-Capillary: 121 mg/dL — ABNORMAL HIGH (ref 70–99)
Glucose-Capillary: 94 mg/dL (ref 70–99)

## 2014-04-12 LAB — BASIC METABOLIC PANEL
BUN: 15 mg/dL (ref 6–23)
CALCIUM: 9.4 mg/dL (ref 8.4–10.5)
CHLORIDE: 92 meq/L — AB (ref 96–112)
CO2: 33 mEq/L — ABNORMAL HIGH (ref 19–32)
Creatinine, Ser: 0.4 mg/dL — ABNORMAL LOW (ref 0.50–1.10)
GFR calc non Af Amer: >90 mL/min (ref >90)
Glucose, Bld: 92 mg/dL (ref 70–99)
Potassium: 4.1 mEq/L (ref 3.7–5.3)
Sodium: 138 mEq/L (ref 137–147)

## 2014-04-12 MED ORDER — NITROGLYCERIN 2 % TD OINT
1.0000 [in_us] | TOPICAL_OINTMENT | Freq: Four times a day (QID) | TRANSDERMAL | Status: DC
  Administered 2014-04-12 – 2014-04-14 (×9): 1 [in_us] via TOPICAL
  Filled 2014-04-12: qty 30

## 2014-04-12 MED ORDER — FREE WATER
200.0000 mL | Freq: Three times a day (TID) | Status: DC
Start: 2014-04-12 — End: 2014-04-14
  Administered 2014-04-12 – 2014-04-14 (×6): 200 mL

## 2014-04-12 MED ORDER — METOPROLOL TARTRATE 25 MG PO TABS
75.0000 mg | ORAL_TABLET | Freq: Once | ORAL | Status: AC
  Administered 2014-04-12: 75 mg via ORAL
  Filled 2014-04-12: qty 1

## 2014-04-12 MED ORDER — METOPROLOL TARTRATE 50 MG PO TABS
75.0000 mg | ORAL_TABLET | Freq: Two times a day (BID) | ORAL | Status: DC
  Administered 2014-04-12 – 2014-04-14 (×4): 75 mg via ORAL
  Filled 2014-04-12 (×5): qty 1

## 2014-04-12 NOTE — Progress Notes (Signed)
S/p Perc G-tube placement yesterday. BP 196/113  Pulse 134  Temp(Src) 99.1 F (37.3 C) (Oral)  Resp 24  Ht 5\' 5"  (1.651 m)  Wt 164 lb 7.4 oz (74.6 kg)  BMI 27.37 kg/m2  SpO2 96% G-tube intact, site clean, dry, NT  Ok to use for meds this am, may begin using for TF at 1pm today.  Call IR with tube issues.  Ascencion Dike PA-C Interventional Radiology 04/12/2014 9:13 AM

## 2014-04-12 NOTE — Progress Notes (Signed)
NUTRITION FOLLOW UP  DOCUMENTATION CODES  Per approved criteria   -Severe malnutrition in the context of chronic illness    Intervention:    Re-start Jevity 1.2 formula at 25 ml/hr and increase by 10 ml every 4 hours to goal rate of 55 ml/hr   Continue Prostat 30 ml once daily via tube  Total TF regimen to provide 1684 kcals, 88 gm protein, 1069 ml free water daily RD to follow for nutrition care plan  Nutrition Dx:   Inadequate oral intake related to inability to eat as evidenced by NPO status, ongoing  Goal:   Intake to meet >90% of estimated nutrition needs, met  Monitor:   TF tolerance/adequacy, weight trend, labs, respiratory status  Assessment:   PMHx significant for recent CVA, GERD, IBS, HTN, diverticulosis. Admitted with worsening L sided weakness. Work-up reveals hemorrhagic conversion of an ischemic infarct.  Patient transferred to 2C-Stepdown from 48M-MICU 4/9.  Patient is currently on ventilator support -- trach MV: 11.6 L/min Temp (24hrs), Avg:98.5 F (36.9 C), Min:98 F (36.7 C), Max:99.1 F (37.3 C)   Patient s/p percutaneous 20 F G-tube placement per IR 4/13. Ready to re-start feedings this afternoon.  Height: Ht Readings from Last 1 Encounters:  04/04/14 _0  (1.651 m)    Weight Status:  Increased with positive fluid status Wt Readings from Last 1 Encounters:  04/12/14 164 lb 7.4 oz (74.6 kg)  04/04/14  139 lb 8.8 oz (63.3 kg)   Body mass index is 27.37 kg/(m^2).  Re-estimated needs:  Kcal: 1500-1750 Protein: 85-100 gm Fluid: 1.5-1.7 L  Skin: Intact  Diet Order: NPO  Labs:   Recent Labs Lab 04/07/14 0500 04/08/14 0520 04/09/14 0500 04/10/14 0430 04/12/14 0500  NA  --  138 138 140 138  K  --  3.7 3.7 3.9 4.1  CL  --  96 92* 94* 92*  CO2  --  31 36* 35* 33*  BUN  --  _1 CREATININE  --  0.38* 0.44* 0.37* 0.40*  CALCIUM  --  8.6 8.6 9.4 9.4  MG 1.6 1.6 1.7  --   --   PHOS 3.0 2.5 3.8  --   --   GLUCOSE  --  143*  147* 132* 92    CBG (last 3)   Recent Labs  04/12/14 0423 04/12/14 0816 04/12/14 1219  GLUCAP 99 115* 120*    Scheduled Meds: . antiseptic oral rinse  15 mL Mouth Rinse q12n4p  . chlorhexidine  15 mL Mouth Rinse BID  . digoxin  0.125 mg Oral Daily  . diltiazem  90 mg Oral TID PC & HS  . etomidate  40 mg Intravenous Once  . famotidine  20 mg Per Tube Daily  . feeding supplement (PRO-STAT SUGAR FREE 64)  30 mL Per Tube Q2000  . heparin subcutaneous  5,000 Units Subcutaneous 3 times per day  . insulin aspart  0-9 Units Subcutaneous 6 times per day  . methimazole  5 mg Oral Daily  . metoprolol tartrate  75 mg Oral BID  . metoprolol tartrate  75 mg Oral Once  . nitroGLYCERIN  1 inch Topical 4 times per day  . Valproic Acid  500 mg Oral TID    Continuous Infusions: . sodium chloride 20 mL/hr at 04/12/14 1134  . feeding supplement (JEVITY 1.2 CAL) Stopped (04/10/14 2354)  . labetalol (NORMODYNE) infusion      Arthur Holms, RD, LDN Pager #: [phone] After-Hours Pager #: [phone]

## 2014-04-12 NOTE — Progress Notes (Signed)
PULMONARY / CRITICAL CARE MEDICINE   Name: Colleen [name] MRN: [id number] DOB: [birth year]    ADMISSION DATE:  04/04/2014 CONSULTATION DATE:  4/6 BRIEF PATIENT DESCRIPTION:   Brief Hx:  63 y.o. Caucasian female with history of COPD, type 2 diabetes, hypertension, atrial fibrillation, and recent right occipital infarct.   SIGNIFICANT EVENTS / STUDIES:   3/18 - R occipital infarct > petechial hemorrhage, d/c from [facility] without coumadin 3/20 - Seen at [facility] with HA, N/V, L weakness, CT with hemorrhagic conversion of R infarct.  Rx for HCAP.  3/30 - d/c to CIR 4/03 -  Stable hematoma in the right temporal and occipital lobes with subfalcine herniation. Stable lateral ventricular size with trace hemorrhage in the left   lateral ventricle.  ........................................................................................................................................ 4/06 - Return to [facility] with fevers, increased secretionsCXR, moderate right-sided pleural effusion, with associated airspace opacity. 4/06 - CT head >> No significant change in lg ICH involving the right temporal and occipital lobes. Mild R to L midline shift, unchanged. 4/07 - EEG: no epileptic activity 4/08 - Had several episodes of bradycardia with stable blood pressure 4/08 - trach placed 4/12 - weaned on PSV 4-5 hours 4/13 - PEG tube  LINES / TUBES: ETT4/6>>4/8 CVL 4/6>>> Trach (DF) 4/8>>> G-Tube 4/13>>>  CULTURES: 4/6 respiratory culture>>>negative 4/6 blood culture>>>neg 4/6 Urine culture>>>negative 4/9 C diff >>>NEG  ANTIBIOTICS: Zosyn 3/26>>4/5 Vancomycin 4/6>>>4/8 Cefpeme 4/6>>>4/8 Ceftriaxone 4/8>>>4/11 Levaquin per tube 4/11>>>Stop 4/12  SUBJECTIVE: HR and BP up after missing meds for G tube placement  VITAL SIGNS: Temp:  [98 F (36.7 C)-99.1 F (37.3 C)] 99.1 F (37.3 C) (04/14 0813) Pulse Rate:  [59-134] 134 (04/14 0813) Resp:  [12-30] 24 (04/14 0813) BP: (118-200)/(52-113)  196/113 mmHg (04/14 0813) SpO2:  [96 %-100 %] 96 % (04/14 0813) FiO2 (%):  [40 %] 40 % (04/14 0813) Weight:  [74.6 kg (164 lb 7.4 oz)] 74.6 kg (164 lb 7.4 oz) (04/14 0412)  VENTILATOR SETTINGS: Vent Mode:  [-] PRVC FiO2 (%):  [40 %] 40 % Set Rate:  [12 bmp] 12 bmp Vt Set:  [450 mL] 450 mL PEEP:  [5 cmH20] 5 cmH20 Plateau Pressure:  [15 cmH20-20 cmH20] 20 cmH20  INTAKE / OUTPUT: Intake/Output     04/13 0701 - 04/14 0700 04/14 0701 - 04/15 0700   NG/GT     Total Intake(mL/kg)     Net            Urine Occurrence 5 x      PHYSICAL EXAMINATION: General: no distress on vent HEENT: Trach in place, NCAT PULM: few rhonchi bilat CV: Tachy, irreg irreg AB: BS+, soft, nontender; PEG site c/d/i Ext: warm, trace edema Neuro: non-verbal, reaches for tubes, lines  LABS:  CBC  Recent Labs Lab 04/09/14 0500 04/10/14 0430 04/12/14 0500  WBC 13.4* 11.4* 12.0*  HGB 9.0* 9.5* 9.9*  HCT 29.6* 32.6* 33.3*  PLT 218 251 288   Coag's  Recent Labs Lab 04/05/14 0846 04/06/14 0811 04/06/14 0815  APTT 28  --  25  INR 1.08 1.10  --    BMET  Recent Labs Lab 04/09/14 0500 04/10/14 0430 04/12/14 0500  NA 138 140 138  K 3.7 3.9 4.1  CL 92* 94* 92*  CO2 36* 35* 33*  BUN 18 18 15   CREATININE 0.44* 0.37* 0.40*  GLUCOSE 147* 132* 92   Electrolytes  Recent Labs Lab 04/07/14 0500 04/08/14 0520 04/09/14 0500 04/10/14 0430 04/12/14 0500  CALCIUM  --  8.6 8.6  9.4 9.4  MG 1.6 1.6 1.7  --   --   PHOS 3.0 2.5 3.8  --   --    Liver Enzymes  Recent Labs Lab 04/07/14 0425  AST 26  ALT 13  ALKPHOS 46  BILITOT 0.3  ALBUMIN 1.9*   Imaging  4/14 CXR > RLL atelectasis vs consolidation with effusion  ASSESSMENT / PLAN: PULMONARY A:  Aspiration PNA > resolved Acute Respiratory Failure  P:   - Daily PS trials, progress to ATC if tolerated (likely she will need vent SNF) - Vent SNF referral - Wean oxygen for sats > 92%  CARDIOVASCULAR A:   A Fib - 3 episodes of Afib  RVR 4/14 overnight, 2D echo on 03/19/14 showed EF 55 to 60% without wall motion abnormality.  Hypertension P:  - restart PO meds now - Digoxin 0.125 mg daily - Metoprolol 75 mg bid, Diltiazem 90 mg tid - SCD for DVT ppx  RENAL A:   Hypochloremia  Hypokalemia  P:   - Monitor BMP - Replete electrolytes as indicated  GASTROINTESTINAL A:  Dysphagia  P:    - H2 blocker per tube - PEG placement 4/13, NPO  HEMATOLOGIC A:   No acute issues P:  - Coags wnl - SCD  - cont sq hep  INFECTIOUS A:  Aspiration PNA > resolved P:    - off abx, monitor fever curve / leukocytosis  ENDOCRINE A:   DM-II - A1c 6.1 P:   - sensitive SSI  NEUROLOGIC A:    CVA - Hx of ischemic stroke w acute hemorrhagic conversion of ischemic CVA. CT-head 4/7: No significant change in her large intracerebral hematoma involving the right temporal and occipital lobes. Mild right to left midline shift, unchanged.  EEG: no epileptic activity. P:   - cont valproic acid  GLOBAL: -DNR  -will need vent SNF ([facility] will not take medicaid)   CRITICAL CARE: The patient is critically ill with multiple organ systems failure and requires high complexity decision making for assessment and support, frequent evaluation and titration of therapies, application of advanced monitoring technologies and extensive interpretation of multiple databases. Critical Care Time devoted to patient care services described in this note is 35 minutes.   Roselie Awkward, MD [place] [facility] Pager: [phone] Cell: [phone] If no response, call [phone]    8:29 AM

## 2014-04-12 NOTE — Progress Notes (Signed)
PT Cancellation Note  Patient Details Name: RADHIKA DERSHEM MRN: [id number] DOB: Nov 14, 1951   Cancelled Treatment:    Reason Eval/Treat Not Completed: Medical issues which prohibited therapy. Pt HR currently 152 and BP 205/162 with pt not medically appropriate to see at this time. Will attempt next date.   Brittnay Pigman B Ruari Mudgett 04/12/2014, 8:14 AM Elwyn Reach, [place]

## 2014-04-12 NOTE — Progress Notes (Signed)
No atc trial done due to HR in 160s and BP 200+.  Per MD to wait til other meds are given to try anthing else.

## 2014-04-12 NOTE — Progress Notes (Signed)
Emailed vent SNFs requesting updates; facilities continue to review.   Ky Barban, MSW, [facility] Clinical Social Worker [phone]

## 2014-04-13 LAB — BASIC METABOLIC PANEL
BUN: 16 mg/dL (ref 6–23)
CO2: 32 meq/L (ref 19–32)
CREATININE: 0.47 mg/dL — AB (ref 0.50–1.10)
Calcium: 8.9 mg/dL (ref 8.4–10.5)
Chloride: 89 mEq/L — ABNORMAL LOW (ref 96–112)
GFR calc Af Amer: >90 mL/min (ref >90)
GFR calc non Af Amer: >90 mL/min (ref >90)
Glucose, Bld: 174 mg/dL — ABNORMAL HIGH (ref 70–99)
Potassium: 4 mEq/L (ref 3.7–5.3)
Sodium: 136 mEq/L — ABNORMAL LOW (ref 137–147)

## 2014-04-13 LAB — GLUCOSE, CAPILLARY
GLUCOSE-CAPILLARY: 135 mg/dL — AB (ref 70–99)
GLUCOSE-CAPILLARY: 143 mg/dL — AB (ref 70–99)
GLUCOSE-CAPILLARY: 159 mg/dL — AB (ref 70–99)
Glucose-Capillary: 178 mg/dL — ABNORMAL HIGH (ref 70–99)
Glucose-Capillary: 147 mg/dL — ABNORMAL HIGH (ref 70–99)
Glucose-Capillary: 175 mg/dL — ABNORMAL HIGH (ref 70–99)

## 2014-04-13 NOTE — Progress Notes (Signed)
Occupational Therapy Treatment Patient Details Name: Colleen [name] MRN: [id number] DOB: [birth year] Today's Date: 04/13/2014    History of present illness Colleen [name] is a 63 y.o. female with a history of recent ischemic stroke (02/2014) who had coumadin due to hemorrhagic transformation at site of stroke (R temporo-occipital hemorrhage) with midline shift and decr size of Lt lateral ventricle. Went to CIR on 3/31 and returned to acute 4/6 due to respiratory failure requiring intubation. Trach done 4/8.   OT comments  Pt limited by lethargy today even with multiple types and attempts to arouse.  Following some simple commands sometimes requiring 10 plus seconds to respond. Kept eyes closed entire session.  Follow Up Recommendations  SNF    Equipment Recommendations       Recommendations for Other Services      Precautions / Restrictions Precautions Precautions: Fall Precaution Comments: decreased arousal, NPO and Panda tube, flexiseal, vent/trach       Mobility Bed Mobility        Transfers     Balance Overall balance assessment: Needs assistance Sitting-balance support: Bilateral upper extremity supported;Feet supported Sitting balance-Leahy Scale: Zero Sitting balance - Comments: Edge of chair x approximately 6 minutes, tends to lean posterior and toward her L.                         ADL                                         General ADL Comments: Pt up in chair.  Lethargic.  Attempted to arouse pt by putting her feet on the floor, assisting her into sitting, cold cloth on face, loud talking and brightly lights.  Pt did not open her eyes, but did attempt briefly to maintain her head at midline once it was placed.  Pt followed commands 20% of requests with up with up to 15 second delay including,"squeeze my hand" with R, and for AAROM exercises with R shoulder.       Vision                     Perception     Praxis       Cognition   Behavior During Therapy: Flat affect Overall Cognitive Status: Difficult to assess Area of Impairment: Attention;Following commands   Current Attention Level: Sustained    Following Commands: Follows one step commands inconsistently;Follows one step commands with increased time     Problem Solving: Decreased initiation;Slow processing General Comments: mostly at focused level of attention due to decr arousal; sustained when sitting EOB and followed commands with RUE    Extremity/Trunk Assessment               Exercises R shoulder AAROM x 10   Shoulder Instructions       General Comments      Pertinent Vitals/ Pain       No pain indicators, on ventilator  Home Living                                          Prior Functioning/Environment              Frequency Min 2X/week     Progress Toward Goals  OT  Goals(current goals can now be found in the care plan section)  Progress towards OT goals: Not progressing toward goals - comment (lethargy)  Acute Rehab OT Goals Patient Stated Goal: unable, no attempts to verbalize/lip speak  Plan Discharge plan remains appropriate    Co-evaluation                 End of Session     Activity Tolerance Patient limited by lethargy   Patient Left in chair;with call bell/phone within reach   Nurse Communication  (pt's lethargy)        Time: 1345-1406 OT Time Calculation (min): 21 min  Charges: OT General Charges $OT Visit: 1 Procedure OT Treatments $Therapeutic Activity: 8-22 mins  Colleen [name] Colleen [name] 04/13/2014, 2:15 PM [phone]

## 2014-04-13 NOTE — Progress Notes (Signed)
PULMONARY / CRITICAL CARE MEDICINE   Name: Colleen [name] MRN: [id number] DOB: September 20, 1951    ADMISSION DATE:  04/04/2014 CONSULTATION DATE:  4/6 BRIEF PATIENT DESCRIPTION:   Brief Hx:  63 y.o. Caucasian female with history of COPD, type 2 diabetes, hypertension, atrial fibrillation, and recent right occipital infarct.   SIGNIFICANT EVENTS / STUDIES:   3/18 - R occipital infarct > petechial hemorrhage, d/c from [facility] without coumadin 3/20 - Seen at [facility] with HA, N/V, L weakness, CT with hemorrhagic conversion of R infarct.  Rx for HCAP.  3/30 - d/c to CIR 4/03 -  Stable hematoma in the right temporal and occipital lobes with subfalcine herniation. Stable lateral ventricular size with trace hemorrhage in the left   lateral ventricle.  ........................................................................................................................................ 4/06 - Return to [facility] with fevers, increased secretionsCXR, moderate right-sided pleural effusion, with associated airspace opacity. 4/06 - CT head >> No significant change in lg ICH involving the right temporal and occipital lobes. Mild R to L midline shift, unchanged. 4/07 - EEG: no epileptic activity 4/08 - Had several episodes of bradycardia with stable blood pressure 4/08 - trach placed 4/12 - weaned on PSV 4-5 hours 4/13 - PEG tube 4/14 - Afib RVR, hypertension, could not wean  LINES / TUBES: ETT4/6>>4/8 CVL 4/6>>> Trach (DF) 4/8>>> G-Tube 4/13>>>  CULTURES: 4/6 respiratory culture>>>negative 4/6 blood culture>>>neg 4/6 Urine culture>>>negative 4/9 C diff >>>NEG  ANTIBIOTICS: Zosyn 3/26>>4/5 Vancomycin 4/6>>>4/8 Cefpeme 4/6>>>4/8 Ceftriaxone 4/8>>>4/11 Levaquin per tube 4/11>>>Stop 4/12  SUBJECTIVE: got BP and HR under control with multiple medication adjustments yesterday  VITAL SIGNS: Temp:  [98.2 F (36.8 C)-99.1 F (37.3 C)] 98.3 F (36.8 C) (04/15 0700) Pulse Rate:  [64-120] 113  (04/15 0700) Resp:  [17-25] 25 (04/15 0700) BP: (118-186)/(66-103) 145/82 mmHg (04/15 0700) SpO2:  [95 %-100 %] 100 % (04/15 0700) FiO2 (%):  [40 %] 40 % (04/15 0700) Weight:  [74.5 kg (164 lb 3.9 oz)] 74.5 kg (164 lb 3.9 oz) (04/15 0400)  VENTILATOR SETTINGS: Vent Mode:  [-] PRVC FiO2 (%):  [40 %] 40 % Set Rate:  [12 bmp] 12 bmp Vt Set:  [450 mL] 450 mL PEEP:  [5 cmH20] 5 cmH20 Plateau Pressure:  [13 cmH20-18 cmH20] 13 cmH20  INTAKE / OUTPUT: Intake/Output     04/14 0701 - 04/15 0700 04/15 0701 - 04/16 0700   I.V. (mL/kg) 2600 (34.9)    NG/GT 1607.4    Total Intake(mL/kg) 4207.4 (56.5)    Net +4207.4          Urine Occurrence 6 x 1 x     PHYSICAL EXAMINATION: General: no distress on vent HEENT: Trach in place, NCAT PULM: few rhonchi bilat CV: Tachy, irreg irreg AB: BS+, soft, nontender; PEG site c/d/i Ext: warm, trace edema Neuro: non-verbal, reaches for tubes, lines  LABS:  CBC  Recent Labs Lab 04/09/14 0500 04/10/14 0430 04/12/14 0500  WBC 13.4* 11.4* 12.0*  HGB 9.0* 9.5* 9.9*  HCT 29.6* 32.6* 33.3*  PLT 218 251 288   Coag's No results found for this basename: APTT, INR,  in the last 168 hours BMET  Recent Labs Lab 04/09/14 0500 04/10/14 0430 04/12/14 0500  NA 138 140 138  K 3.7 3.9 4.1  CL 92* 94* 92*  CO2 36* 35* 33*  BUN 18 18 15   CREATININE 0.44* 0.37* 0.40*  GLUCOSE 147* 132* 92   Electrolytes  Recent Labs Lab 04/07/14 0500 04/08/14 0520 04/09/14 0500 04/10/14 0430 04/12/14 0500  CALCIUM  --  8.6 8.6 9.4 9.4  MG 1.6 1.6 1.7  --   --   PHOS 3.0 2.5 3.8  --   --    Liver Enzymes  Recent Labs Lab 04/07/14 0425  AST 26  ALT 13  ALKPHOS 46  BILITOT 0.3  ALBUMIN 1.9*   Imaging  4/14 CXR > RLL atelectasis vs consolidation with effusion  ASSESSMENT / PLAN: PULMONARY A:  Aspiration PNA > resolved Acute Respiratory Failure,  Very slowly weaning Chronic R effusion/RLL collapse P:   - Daily PS trials, progress to ATC  hopefully this week (likely she will need vent SNF) - Vent SNF referral - Wean oxygen for sats > 92%. - Pulm toilette  CARDIOVASCULAR A:   A Fib - better rate control 4/15  Hypertension P:  - restart PO meds now - Digoxin 0.125 mg daily - Metoprolol 75 mg bid, Diltiazem 90 mg tid - SCD for DVT ppx  RENAL A:   Hypochloremia   P:   - Monitor BMP - Replete electrolytes as indicated  GASTROINTESTINAL A:  Dysphagia  P:    - H2 blocker per tube - Advance TF per protocol  HEMATOLOGIC A:   No acute issues P:  - Coags wnl - SCD  - cont sq hep  INFECTIOUS A:  Aspiration PNA > resolved P:    - off abx, monitor fever curve / leukocytosis  ENDOCRINE A:   DM-II - A1c 6.1 P:   - sensitive SSI  NEUROLOGIC A:    CVA - Hx of ischemic stroke w acute hemorrhagic conversion of ischemic CVA. CT-head 4/7: No significant change in her large intracerebral hematoma involving the right temporal and occipital lobes. Mild right to left midline shift, unchanged.  EEG: no epileptic activity. P:   - cont valproic acid  GLOBAL: -DNR  -will need vent SNF ([facility] will not take medicaid)   CRITICAL CARE: The patient is critically ill with multiple organ systems failure and requires high complexity decision making for assessment and support, frequent evaluation and titration of therapies, application of advanced monitoring technologies and extensive interpretation of multiple databases. Critical Care Time devoted to patient care services described in this note is 35 minutes.   Roselie Awkward, MD [place] Pager: [phone] Cell: [phone] If no response, call [phone]    8:19 AM

## 2014-04-13 NOTE — Progress Notes (Signed)
Vent CK done. Pt in no distress at this time.

## 2014-04-13 NOTE — Progress Notes (Addendum)
Vent SNF in [facility] verifying insurance for long-term care coverage. Called Development worker, community, who states pt does have long-term Medicaid benefit--e-mailed admission at SNF to inform. Facility also asked if pt was at a SNF prior to hospitalization; conflicting information in her chart. Called pt's daughter, who states pt has never been to rehab and was living at home prior to hospitalization--informed SNF. Received call from [facility] facility, state they cannot take pt until she has had trach for at least 30 days--also had questions about behavior and need for restraints. [facility] CSW to update family.  Addendum: Updated pt's husband concerning placement advancements--[place] vent SNF states they will take pt if they get confirmation she has long-term Medicaid insurance. State this could be as early as tomorrow--CSW on standby. [facility] reviewing and to update CSW as well. [facility] first choice for placement if this is an option.  Ky Barban, MSW, [facility] Clinical Social Worker [phone]

## 2014-04-13 NOTE — Progress Notes (Signed)
Physical Therapy Treatment Patient Details Name: Colleen [name] MRN: [id number] DOB: 1951-07-23 Today's Date: 04/13/2014    History of Present Illness Colleen [name] is a 63 y.o. female with a history of recent ischemic stroke (02/2014) who had coumadin due to hemorrhagic transformation at site of stroke (R temporo-occipital hemorrhage) with midline shift and decr size of Lt lateral ventricle. Went to CIR on 3/31 and returned to acute 4/6 due to respiratory failure requiring intubation. Trach done 4/8.    PT Comments    Pt with eyes open (once became alert) throughout entire 45 minute session. Remains very deconditioned, however did follow simple commands with RUE and did bear weight on RLE with standing.   Follow Up Recommendations  SNF;Supervision/Assistance - 24 hour     Equipment Recommendations  None recommended by PT    Recommendations for Other Services       Precautions / Restrictions Precautions Precautions: Fall Precaution Comments: decreased arousal, NPO and Panda tube, flexiseal, vent/trach Restrictions Weight Bearing Restrictions: No    Mobility  Bed Mobility Overal bed mobility: Needs Assistance;+2 for physical assistance Bed Mobility: Supine to Sit;Rolling Rolling: Total assist;+2 for physical assistance Sidelying to sit: Total assist;+2 for physical assistance       General bed mobility comments: eyes open while rolling and come to sit; due to vent on Lt side of bed, Lt side to sit (more difficult for pt to assist)  Transfers Overall transfer level: Needs assistance Equipment used: None Transfers: Sit to/from [employer] Sit to Stand: Max assist;+2 physical assistance;From elevated surface   Squat pivot transfers: Max assist;+2 physical assistance;From elevated surface     General transfer comment: stood using pad under pelvis for support; pt fully weight bearing on RLE with ability to support her knee in extension herself x 20+seconds; no  activation of LLE noted in standing  Ambulation/Gait                 Stairs            Wheelchair Mobility    Modified Rankin (Stroke Patients Only) Modified Rankin (Stroke Patients Only) Pre-Morbid Rankin Score: Severe disability Modified Rankin: Severe disability     Balance Overall balance assessment: Needs assistance Sitting-balance support: Bilateral upper extremity supported;Feet supported Sitting balance-Leahy Scale: Zero Sitting balance - Comments: EOB 20+ minutes; decr flexibility into hip flexion which is causing posterior lean; pt did not resist or push posteriorly as brought forward into upright sitting and even forward/hip flexion; pt assisted with controlled lean to Rt onto Rt forearm with mod assist; required max assist to push up to midline with RUE   Standing balance support: Single extremity supported (Used RUE on PT's UEj) Standing balance-Leahy Scale: Zero                      Cognition Arousal/Alertness: Lethargic Behavior During Therapy: Flat affect Overall Cognitive Status: Difficult to assess Area of Impairment: Attention;Following commands   Current Attention Level: Sustained   Following Commands: Follows one step commands inconsistently;Follows one step commands with increased time (with RUE)       General Comments: mostly at focused level of attention due to decr arousal; sustained when sitting EOB and followed commands with RUE    Exercises General Exercises - Lower Extremity Hip Flexion/Marching: AAROM;Right;Other reps (comment);Seated (pt actively lifting Rt LE with assist) Other Exercises Other Exercises: pt unable to initiate LAQ with RLE, however did bear weight with good quad strength in  standing    General Comments        Pertinent Vitals/Pain On vent (PRVC, PEEP 5, FiO2 40, RR set 12 (varied 21-26) SaO2 99%    Home Living                      Prior Function            PT Goals (current goals  can now be found in the care plan section) Acute Rehab PT Goals Patient Stated Goal: unable, no attempts to verbalize/lip speak Progress towards PT goals: Progressing toward goals    Frequency  Min 2X/week    PT Plan Current plan remains appropriate    Co-evaluation             End of Session Equipment Utilized During Treatment: Gait belt;Oxygen Activity Tolerance: Patient tolerated treatment well Patient left: in chair;with family/visitor present     Time: 1884-1660 PT Time Calculation (min): 46 min  Charges:  $Therapeutic Activity: 38-52 mins                    G Codes:      Colleen [name] Colleen [name] 04/16/14, 11:35 AM Pager [phone]

## 2014-04-14 ENCOUNTER — Inpatient Hospital Stay (HOSPITAL_COMMUNITY): Payer: Medicaid Other

## 2014-04-14 LAB — GLUCOSE, CAPILLARY
GLUCOSE-CAPILLARY: 167 mg/dL — AB (ref 70–99)
Glucose-Capillary: 140 mg/dL — ABNORMAL HIGH (ref 70–99)
Glucose-Capillary: 143 mg/dL — ABNORMAL HIGH (ref 70–99)
Glucose-Capillary: 178 mg/dL — ABNORMAL HIGH (ref 70–99)

## 2014-04-14 LAB — BASIC METABOLIC PANEL
BUN: 22 mg/dL (ref 6–23)
CO2: 30 meq/L (ref 19–32)
Calcium: 9.1 mg/dL (ref 8.4–10.5)
Chloride: 93 mEq/L — ABNORMAL LOW (ref 96–112)
Creatinine, Ser: 0.41 mg/dL — ABNORMAL LOW (ref 0.50–1.10)
GFR calc Af Amer: >90 mL/min (ref >90)
GFR calc non Af Amer: >90 mL/min (ref >90)
Glucose, Bld: 134 mg/dL — ABNORMAL HIGH (ref 70–99)
POTASSIUM: 5.3 meq/L (ref 3.7–5.3)
SODIUM: 136 meq/L — AB (ref 137–147)

## 2014-04-14 MED ORDER — FUROSEMIDE 10 MG/ML IJ SOLN
40.0000 mg | Freq: Four times a day (QID) | INTRAMUSCULAR | Status: DC
  Administered 2014-04-14: 40 mg via INTRAVENOUS
  Filled 2014-04-14: qty 4

## 2014-04-14 MED ORDER — PRO-STAT SUGAR FREE PO LIQD: 30.0000 mL | Freq: Every day | ORAL | Status: AC

## 2014-04-14 MED ORDER — JEVITY 1.2 CAL PO LIQD: ORAL | Status: AC

## 2014-04-14 MED ORDER — DIGOXIN 125 MCG PO TABS: 0.1250 mg | ORAL_TABLET | Freq: Every day | ORAL | Status: AC

## 2014-04-14 MED ORDER — LEVALBUTEROL HCL 0.63 MG/3ML IN NEBU: 0.6300 mg | INHALATION_SOLUTION | Freq: Four times a day (QID) | RESPIRATORY_TRACT | Status: AC | PRN

## 2014-04-14 MED ORDER — CHLORHEXIDINE GLUCONATE 0.12 % MT SOLN: 15.0000 mL | Freq: Two times a day (BID) | OROMUCOSAL | Status: AC

## 2014-04-14 MED ORDER — VALPROIC ACID 250 MG/5ML PO SYRP: 500.0000 mg | ORAL_SOLUTION | Freq: Three times a day (TID) | ORAL | Status: AC

## 2014-04-14 MED ORDER — NITROGLYCERIN 2 % TD OINT: 1.0000 [in_us] | TOPICAL_OINTMENT | Freq: Four times a day (QID) | TRANSDERMAL | Status: AC

## 2014-04-14 MED ORDER — DILTIAZEM 12 MG/ML ORAL SUSPENSION: 90.0000 mg | Freq: Three times a day (TID) | ORAL | Status: AC

## 2014-04-14 MED ORDER — FREE WATER: 200.0000 mL | Freq: Three times a day (TID) | Status: AC

## 2014-04-14 MED ORDER — IPRATROPIUM BROMIDE 0.02 % IN SOLN: 0.5000 mg | Freq: Four times a day (QID) | RESPIRATORY_TRACT | Status: DC | PRN

## 2014-04-14 MED ORDER — FAMOTIDINE 40 MG/5ML PO SUSR: 20.0000 mg | Freq: Every day | ORAL | Status: AC

## 2014-04-14 MED ORDER — INSULIN ASPART 100 UNIT/ML ~~LOC~~ SOLN: SUBCUTANEOUS | Status: AC

## 2014-04-14 MED ORDER — FUROSEMIDE 40 MG PO TABS: 40.0000 mg | ORAL_TABLET | Freq: Every day | ORAL | Status: AC | PRN

## 2014-04-14 NOTE — Discharge Summary (Signed)
Physician Discharge Summary  Patient ID: Colleen [name] MRN: [id number] DOB/AGE: 03-Dec-1951 63 y.o.  Admit date: 04/04/2014 Discharge date: 04/14/2014    Discharge Diagnoses:  CVA Acute Respiratory Failure Aspiration PNA Chronic Right Pleural Effusion, RLL Collapse COPD Tracheostomy Status Atrial Fibrillation Hypertension Hypochloremia Hyponatremia Dysphagia PEG Status Diabetes Mellitus  DNR                                                                        DISCHARGE PLAN BY DIAGNOSIS      CVA  Discharge Plan: -continue valproic acid -assess valproic level PRN -follow up in Stroke Clinic with Dr. Leonie Man in 2 months  Acute Respiratory Failure Aspiration PNA Chronic Right Pleural Effusion, RLL Collapse COPD  Discharge Plan: - Full vent support, PRVC 450 / 12 / 5 / 30% - Wean oxygen for sats > 92% - PRN CXR - Xopenex / Atrovent Q6 PRN increased SOB / Wheezing - Mobilize as able / PT Efforts   - Trach care QD with inner cannula change + PRN - Suction PRN increased secretions  Atrial Fibrillation Hypertension  Discharge Plan: - Digoxin 0.125 mg daily  - Metoprolol 75 mg bid, Diltiazem 90 mg tid  - PRN lasix for increased swelling or > 5lb weight gain - no further anti-coagulation as hx of hemorrhagic CVA  Hypochloremia Hyponatremia   Discharge Plan: -PRN monitoring of BMP   Dysphagia PEG Status   Discharge Plan: - Jevity at 55 ml/hr - free water as below - PEG care QD + PRN  - H2 blocker per tube - meds per tube - NPO  Diabetes Mellitus   Discharge Plan: - SSI -CBG's Q4 hours  DNR  Discharge Plan: -DNR -continue current medical care + manage acute medical issues as appropriate                  DISCHARGE SUMMARY   Colleen [name] is a 63 y.o. 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  Discharged from [facility] without anti-coagulants that she previously took for atrial fibrillation.    She presented to [facility] ER on 03/18/14 with complaints of headache, nausea, vomiting, left sided weakness and concern for HCAP.  Evaluation at that time with CT of the head showed hemorrhagic conversion of the previous R infarct.  She was not a TPA candidate secondary to stroke with in the past 90 days and hemorrhage. Neurosurgeon consulted (Dr. Ronnald Ramp) felt no surgical intervention was necessary. She was admitted to the neuro ICU for further treatment and evaluation.    She was then discharged to [facility].  Repeat CT Head on 4/3 showed stable hematoma in the right temporal and occipital lobes with subfalcine herniation. Stable lateral ventricular size with trace hemorrhage in the left lateral ventricle.   She returned to [facility] on 04/04/14 with fevers, increased secretions CXR moderate right-sided pleural effusion, with associated airspace opacity. She was admitted, treated for HCAP.  Patient was evaluated by Neurology as was comatose on admit.  Due to respiratory distress / AMS, she  required intubation / mechanical ventilation on 4/6 with subsequent placement of tracheostomy (4/6).  All cultures to date are negative.  Patient completed antibiotics as below.  4/7 EEG did not show epileptic activity.  Post trach placement, patient was able to wean for brief periods on PSV intermittently.  PEG tube was placed on 4/13 for ongoing nutritional support.  Weaning efforts held 4/14 due to a brief period of atrial fibrillation with RVR.  Patient was diuresed and rate control improved.  See discharge plan as above.      SIGNIFICANT EVENTS / STUDIES:  3/18 - R occipital infarct > petechial hemorrhage, d/c from [facility] without coumadin  3/20 - Seen at [facility] with HA, N/V, L weakness, CT with hemorrhagic conversion of R  infarct. Rx for HCAP.  3/20 - Carotid Doppler >>1-39% internal carotid artery stenosis bilaterally. Vertebral arteries are patent with antegrade flow.   3/30 - d/c to CIR  4/03 - Stable hematoma in the right temporal and occipital lobes with subfalcine herniation. Stable lateral ventricular size with trace hemorrhage in the left lateral ventricle.  ........................................................................................................................................  4/06 - Return to [facility] with fevers, increased secretions CXR, moderate right-sided pleural effusion, with associated airspace opacity.  4/06 - CT head >> No significant change in lg ICH involving the right temporal and occipital lobes. Mild R to L midline shift, unchanged.  4/07 - EEG: no epileptic activity  4/08 - Had several episodes of bradycardia with stable blood pressure  4/08 - trach placed  4/12 - weaned on PSV 4-5 hours  4/13 - PEG tube  4/14 - Afib RVR, hypertension, could not wean  4/15 - vent weaning efforts (PSV) resumed  LINES / TUBES:  ETT4/6>>4/8  CVL 4/6>>>  Trach (DF) 4/8>>>  G-Tube 4/13>>>   CULTURES:  4/6 respiratory culture>>>negative  4/6 blood culture>>>neg  4/6 Urine culture>>>negative  4/9 C diff >>>NEG   ANTIBIOTICS:  Zosyn 3/26>>4/5  Vancomycin 4/6>>>4/8  Cefpeme 4/6>>>4/8  Ceftriaxone 4/8>>>4/11  Levaquin per tube 4/11>>>Stop 4/12     Discharge Exam: General: no distress on vent  HEENT: #6 trach in place C/D/I, mm pink/moist PULM: resp's even/non-labored on vent, lungs bilaterally with few scattered rhonchi  CV: Tachy, irreg irreg  AB: BS+, soft, nontender; PEG site c/d/i  Ext: warm, trace edema  Neuro: non-verbal, reaches for tubes, lines   Filed Vitals:   04/14/14 0400 04/14/14 0500 04/14/14 0700 04/14/14 0855  BP: 154/81   133/76  Pulse: 100  104 88  Temp: 99.3 F (37.4 C)   97 F (36.1 C)  TempSrc: Axillary   Oral  Resp: $Remo'21  30 18  'iRdAQ$ Height:       Weight:  161 lb 6 oz (73.2 kg)    SpO2: 96%   100%     Discharge Labs  BMET  Recent Labs Lab 04/08/14 0520 04/09/14 0500 04/10/14 0430 04/12/14 0500 04/13/14 0850 04/14/14 0247  NA 138 138 140 138 136* 136*  K 3.7 3.7 3.9 4.1 4.0 5.3  CL 96 92* 94* 92* 89* 93*  CO2 31 36* 35* 33* 32 30  GLUCOSE 143* 147* 132* 92 174* 134*  BUN $Re'17 18 18 15 16 22  'nUb$ CREATININE 0.38* 0.44* 0.37* 0.40* 0.47* 0.41*  CALCIUM 8.6 8.6 9.4 9.4 8.9 9.1  MG 1.6 1.7  --   --   --   --   PHOS 2.5 3.8  --   --   --   --    CBC  Recent  Labs Lab 04/09/14 0500 04/10/14 0430 04/12/14 0500  HGB 9.0* 9.5* 9.9*  HCT 29.6* 32.6* 33.3*  WBC 13.4* 11.4* 12.0*  PLT 218 251 288    Discharge Orders   Future Orders Complete By Expires   Call MD for:  difficulty breathing, headache or visual disturbances  As directed    Call MD for:  persistant nausea and vomiting  As directed    Call MD for:  severe uncontrolled pain  As directed    Call MD for:  temperature >100.4  As directed    Discharge instructions  As directed    Increase activity slowly  As directed            Follow-up Information   Follow up with [name], NP. (Post discharge from SNF)    Contact information:   [place] [address] [phone]          Medication List    STOP taking these medications       ADVAIR DISKUS 250-50 MCG/DOSE Aepb  Generic drug:  Fluticasone-Salmeterol     ALPRAZolam 0.5 MG tablet  Commonly known as:  XANAX     atorvastatin 20 MG tablet  Commonly known as:  LIPITOR     diltiazem 360 MG 24 hr capsule  Commonly known as:  CARDIZEM CD     DULoxetine 60 MG capsule  Commonly known as:  CYMBALTA     HYDROcodone-acetaminophen 7.5-325 MG per tablet  Commonly known as:  NORCO     levalbuterol 45 MCG/ACT inhaler  Commonly known as:  XOPENEX HFA     metFORMIN 1000 MG (MOD) 24 hr tablet  Commonly known as:  GLUMETZA     omeprazole 40 MG capsule  Commonly known as:   PRILOSEC     solifenacin 10 MG tablet  Commonly known as:  VESICARE     tiotropium 18 MCG inhalation capsule  Commonly known as:  SPIRIVA     trimethoprim 100 MG tablet  Commonly known as:  TRIMPEX      TAKE these medications       cetirizine 10 MG tablet  Commonly known as:  ZYRTEC  Take 10 mg by mouth at bedtime.     chlorhexidine 0.12 % solution  Commonly known as:  PERIDEX  15 mLs by Mouth Rinse route 2 (two) times daily.     digoxin 0.125 MG tablet  Commonly known as:  LANOXIN  Take 1 tablet (0.125 mg total) by mouth daily.     diltiazem 10 mg/ml oral suspension  Commonly known as:  CARDIZEM  Take 9 mLs (90 mg total) by mouth 4 (four) times daily - after meals and at bedtime.     famotidine 40 MG/5ML suspension  Commonly known as:  PEPCID  Place 2.5 mLs (20 mg total) into feeding tube daily.     feeding supplement (JEVITY 1.2 CAL) Liqd  Infuse at 55 ml /hr     feeding supplement (PRO-STAT SUGAR FREE 64) Liqd  Place 30 mLs into feeding tube daily at 8 pm.     ferrous sulfate 325 (65 FE) MG tablet  Take 325 mg by mouth 2 (two) times daily with a meal.     free water Soln  Place 200 mLs into feeding tube every 8 (eight) hours.     furosemide 40 MG tablet  Commonly known as:  LASIX  Take 1 tablet (40 mg total) by mouth daily as needed (For increased swelling, >5 lb  weight gain in one week).     insulin aspart 100 UNIT/ML injection  Commonly known as:  novoLOG  - CBG < 70: implement hypoglycemia protocol - administer glucose  - CBG 70 - 120: 0 units  - CBG 121 - 150: 1 unit  - CBG 151 - 200: 2 units  - CBG 201 - 250: 3 units  - CBG 251 - 300: 5 units  - CBG 301 - 350: 7 units  - CBG 351 - 400: 9 units  - CBG > 400: call MD     levalbuterol 0.63 MG/3ML nebulizer solution  Commonly known as:  XOPENEX  Take 3 mLs (0.63 mg total) by nebulization every 6 (six) hours as needed for wheezing or shortness of breath.     methimazole 5 MG tablet   Commonly known as:  TAPAZOLE  Take 5 mg by mouth daily.     metoprolol tartrate 25 MG tablet  Commonly known as:  LOPRESSOR  Take 3 tablets (75 mg total) by mouth 2 (two) times daily.     nitroGLYCERIN 2 % ointment  Commonly known as:  NITROGLYN  Apply 1 inch topically every 6 (six) hours.     Valproic Acid 250 MG/5ML Syrp syrup  Commonly known as:  DEPAKENE  Take 10 mLs (500 mg total) by mouth 3 (three) times daily.         Disposition: Ventilator Bed at [facility].    Discharged Condition: Colleen [name] has met maximum benefit of inpatient care and is medically stable and cleared for discharge.  Patient is pending follow up as above.      Time spent on disposition:  Greater than 35 minutes.   Signed: Noe Gens, NP-C [facility] Pgr: [phone] Office: [phone]    Roselie Awkward, MD [facility] Pager: [phone] Cell: [phone] If no response, call [phone]

## 2014-04-18 ENCOUNTER — Telehealth: Payer: Self-pay | Admitting: Pulmonary Disease

## 2014-04-18 NOTE — Telephone Encounter (Signed)
PCP?

## 2014-04-18 NOTE — Telephone Encounter (Signed)
Received PA for Nitro-BID 2% ointment apply 1inch every 6 hours with 30g and no refills from [facility] in [place] via fax. Pt was prescribed this medication upon discharge by Noe Gens NP. Pt was told to follow up with PCP, Heide Scales, after SNF. Pt has no pending apts with our office. BQ--please advise if to continue with PA for this medication, try a preferred medication or to defer to PCP.   Preferred medications: Isosorbide dinitrate, Isosorbide dinitrate ER, Isosorbide mononitrate, Isosorbide mononitrate SR, nitro ER capsules, nitro patches, nitro sublingual, and nitrostat.   PA request form given to Ida.

## 2014-04-18 NOTE — Telephone Encounter (Signed)
Called and spoke with [facility] and they will send the PA to the pts PCP  Dr. Allean Found.  Nothing further is needed.

## 2015-01-12 ENCOUNTER — Encounter (HOSPITAL_COMMUNITY): Payer: Self-pay | Admitting: Cardiology

## 2015-07-31 DEATH — deceased

## 2015-09-21 IMAGING — CT CT HEAD W/O CM
2 series · 15 of 30 positions shown, 19 images · non-contrast
Comparison: MRI of the brain performed 07/25/2005

CLINICAL DATA: Left arm numbness for 2 hours.  Tachycardia.

EXAM:
CT HEAD WITHOUT CONTRAST
TECHNIQUE: Contiguous axial images were obtained from the base of the skull
through the vertex without intravenous contrast.

[Series 2: head w/o · axial · non-contrast · 0.49mm/px · z∈[+48,+178]mm · 13 of 32 slices shown, 17 images]
[im 3/32  brain]
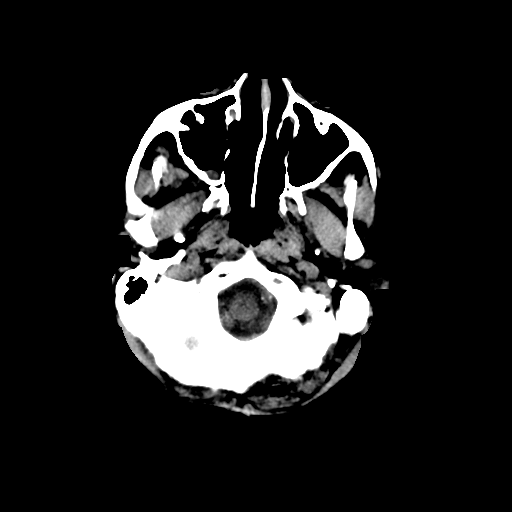
[im 3/32  bone]
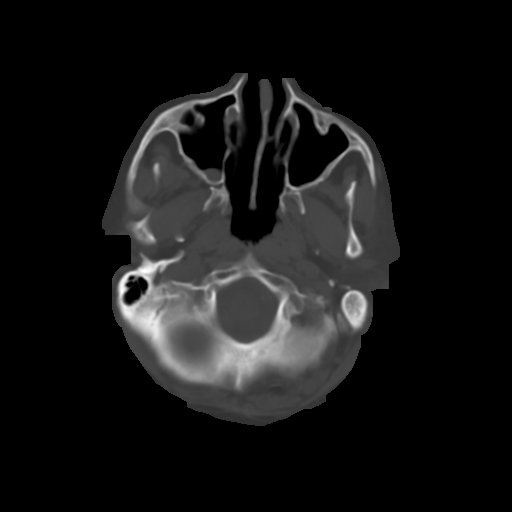
[im 5/32  brain]
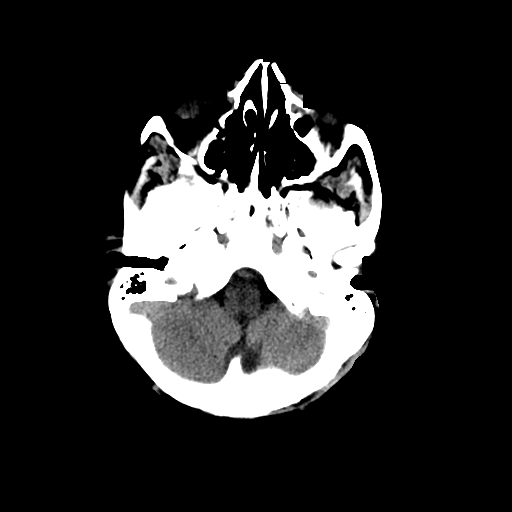
[im 7/32  brain]
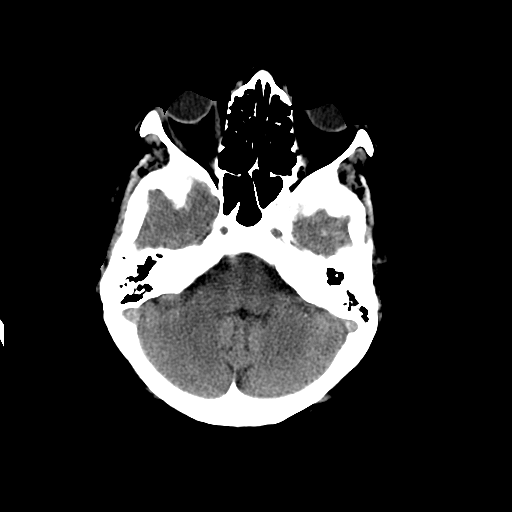
[im 9/32  brain]
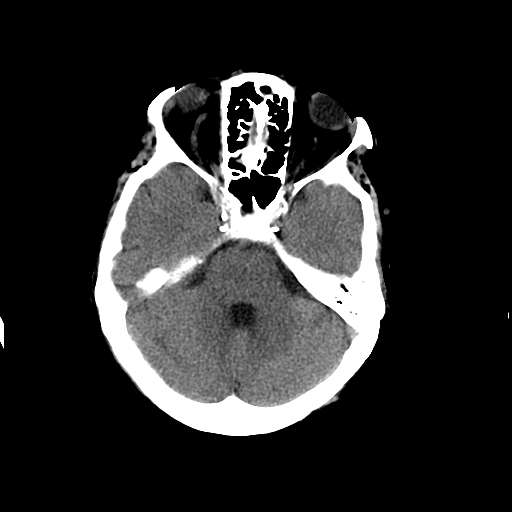
[im 12/32  brain]
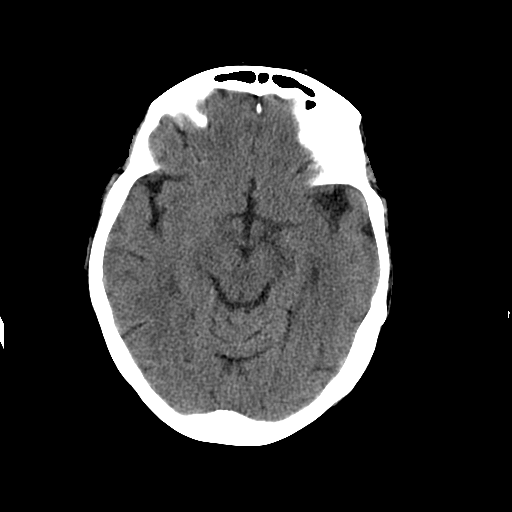
[im 12/32  bone]
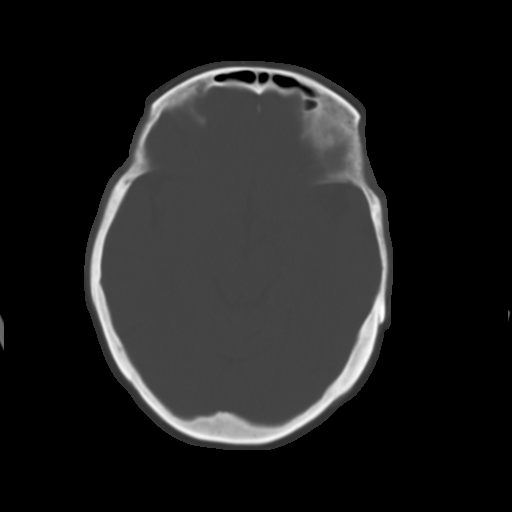
[im 14/32  brain]
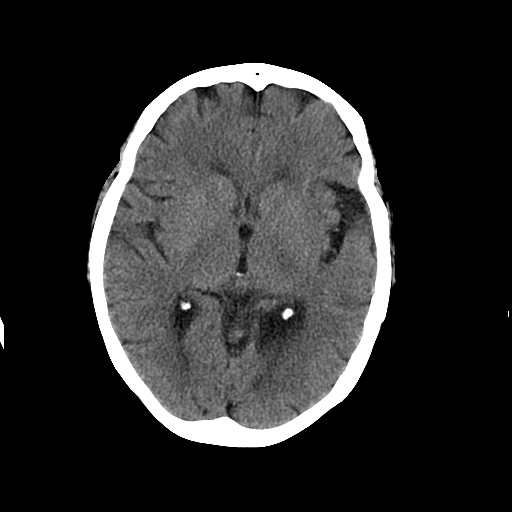
[im 16/32  brain]
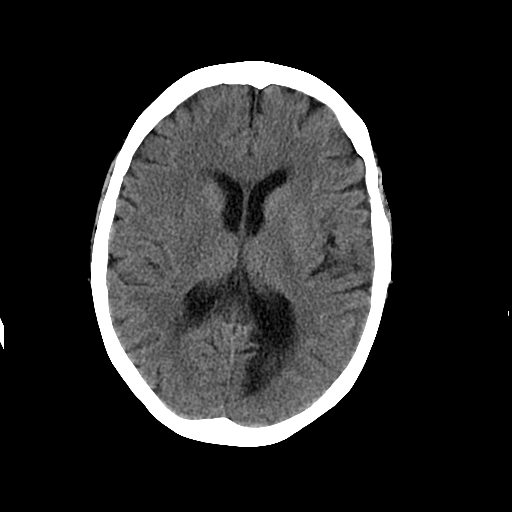
[im 18/32  brain]
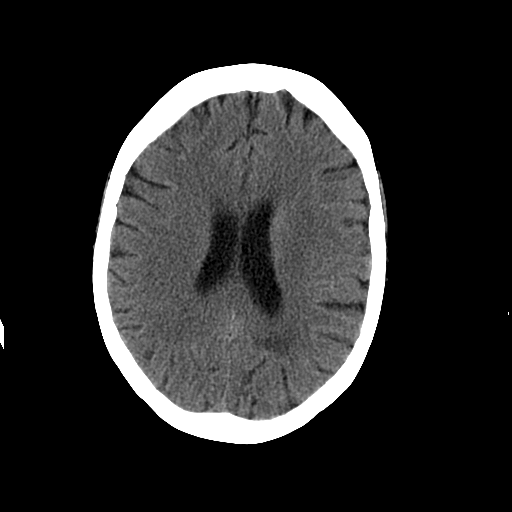
[im 20/32  brain]
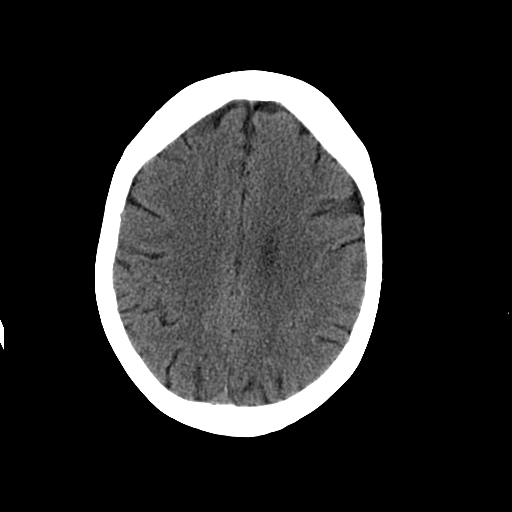
[im 20/32  bone]
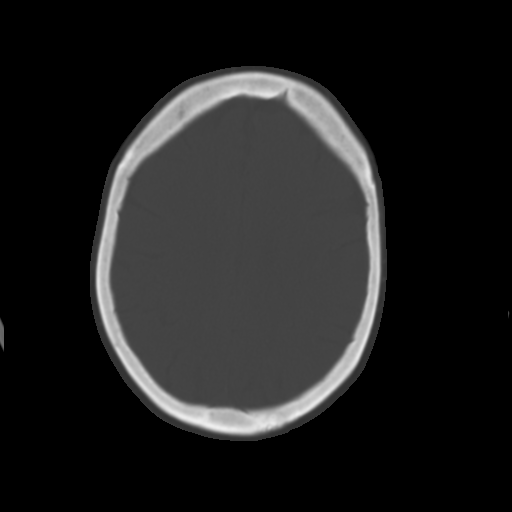
[im 23/32  brain]
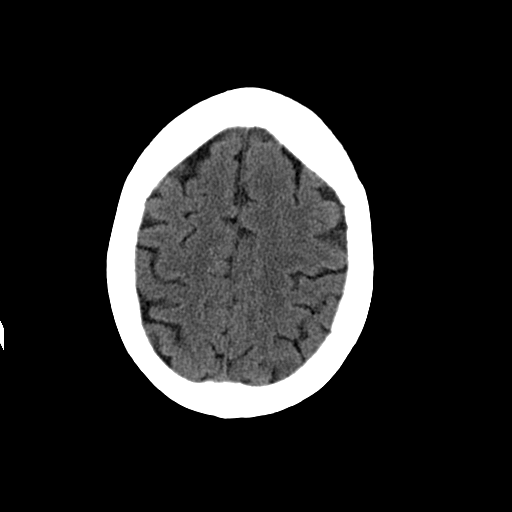
[im 25/32  brain]
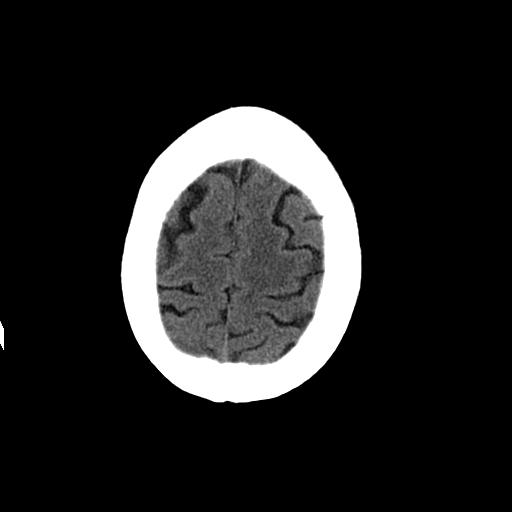
[im 27/32  brain]
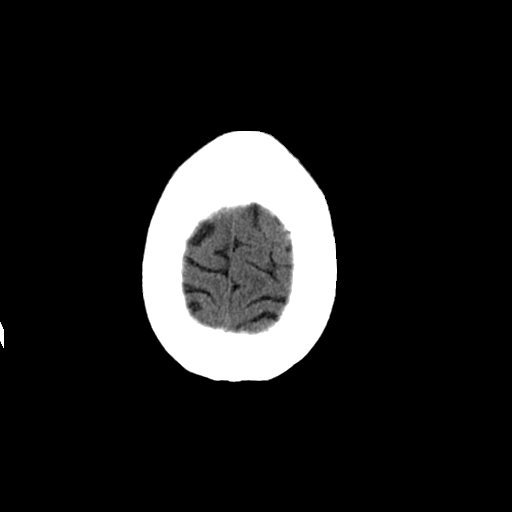
[im 29/32  brain]
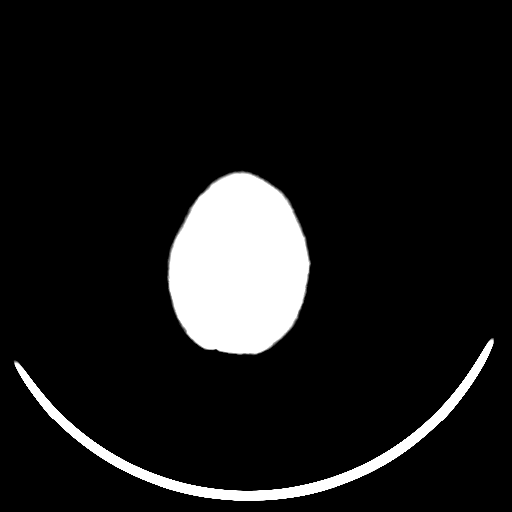
[im 29/32  bone]
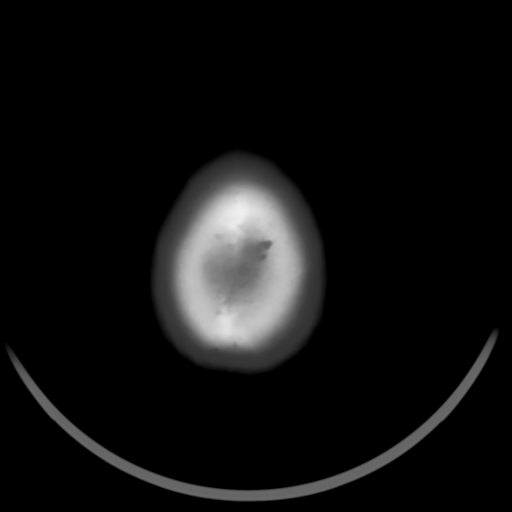

[Series 3: head w/o bone · axial · non-contrast · 0.49mm/px · z∈[+48,+68]mm · 2 of 32 slices shown]
[im 3/32  bone]
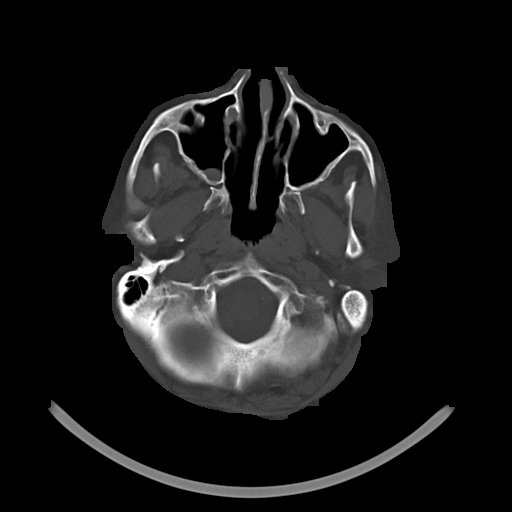
[im 7/32  bone]
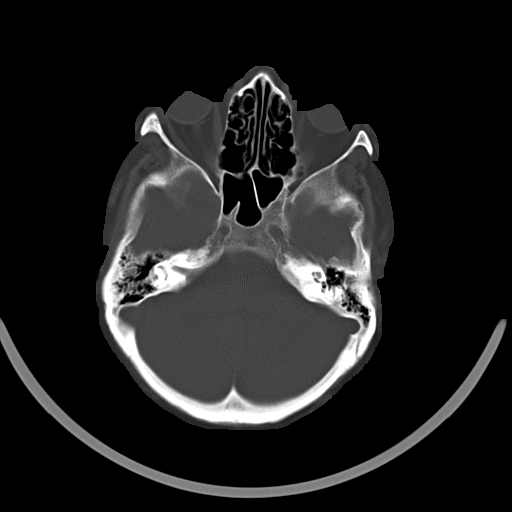

[15 of 30 positions shown; findings below may reference images not displayed]

FINDINGS: There is no evidence of acute infarction, mass lesion, or intra- or
extra-axial hemorrhage on CT.

Mild periventricular white matter change likely reflects small
vessel ischemic microangiopathy. Prominence of the occipital horn of
the left lateral ventricle likely reflects ex vacuo dilatation due
to underlying encephalomalacia, reflecting remote infarct.

The posterior fossa, including the cerebellum, brainstem and fourth
ventricle, is within normal limits. The third ventricle and basal
ganglia are unremarkable in appearance. No mass effect or midline
shift is seen.

There is no evidence of fracture; visualized osseous structures are
unremarkable in appearance. The orbits are within normal limits. The
paranasal sinuses and mastoid air cells are well-aerated. No
significant soft tissue abnormalities are seen.
IMPRESSION: 1. No acute intracranial pathology seen on CT.
2. Mild small vessel ischemic microangiopathy.
3. Chronic encephalomalacia at the left occipital lobe, with
associated ex vacuo dilatation of the occipital horn of the left
lateral ventricle, reflecting remote infarct.

## 2015-11-29 IMAGING — CR DG CHEST 1V PORT
1 series · 1 of 1 positions shown · non-contrast
Comparison: 12/07/2013

CLINICAL DATA: Weakness.

EXAM:
PORTABLE CHEST - 1 VIEW

[AP]
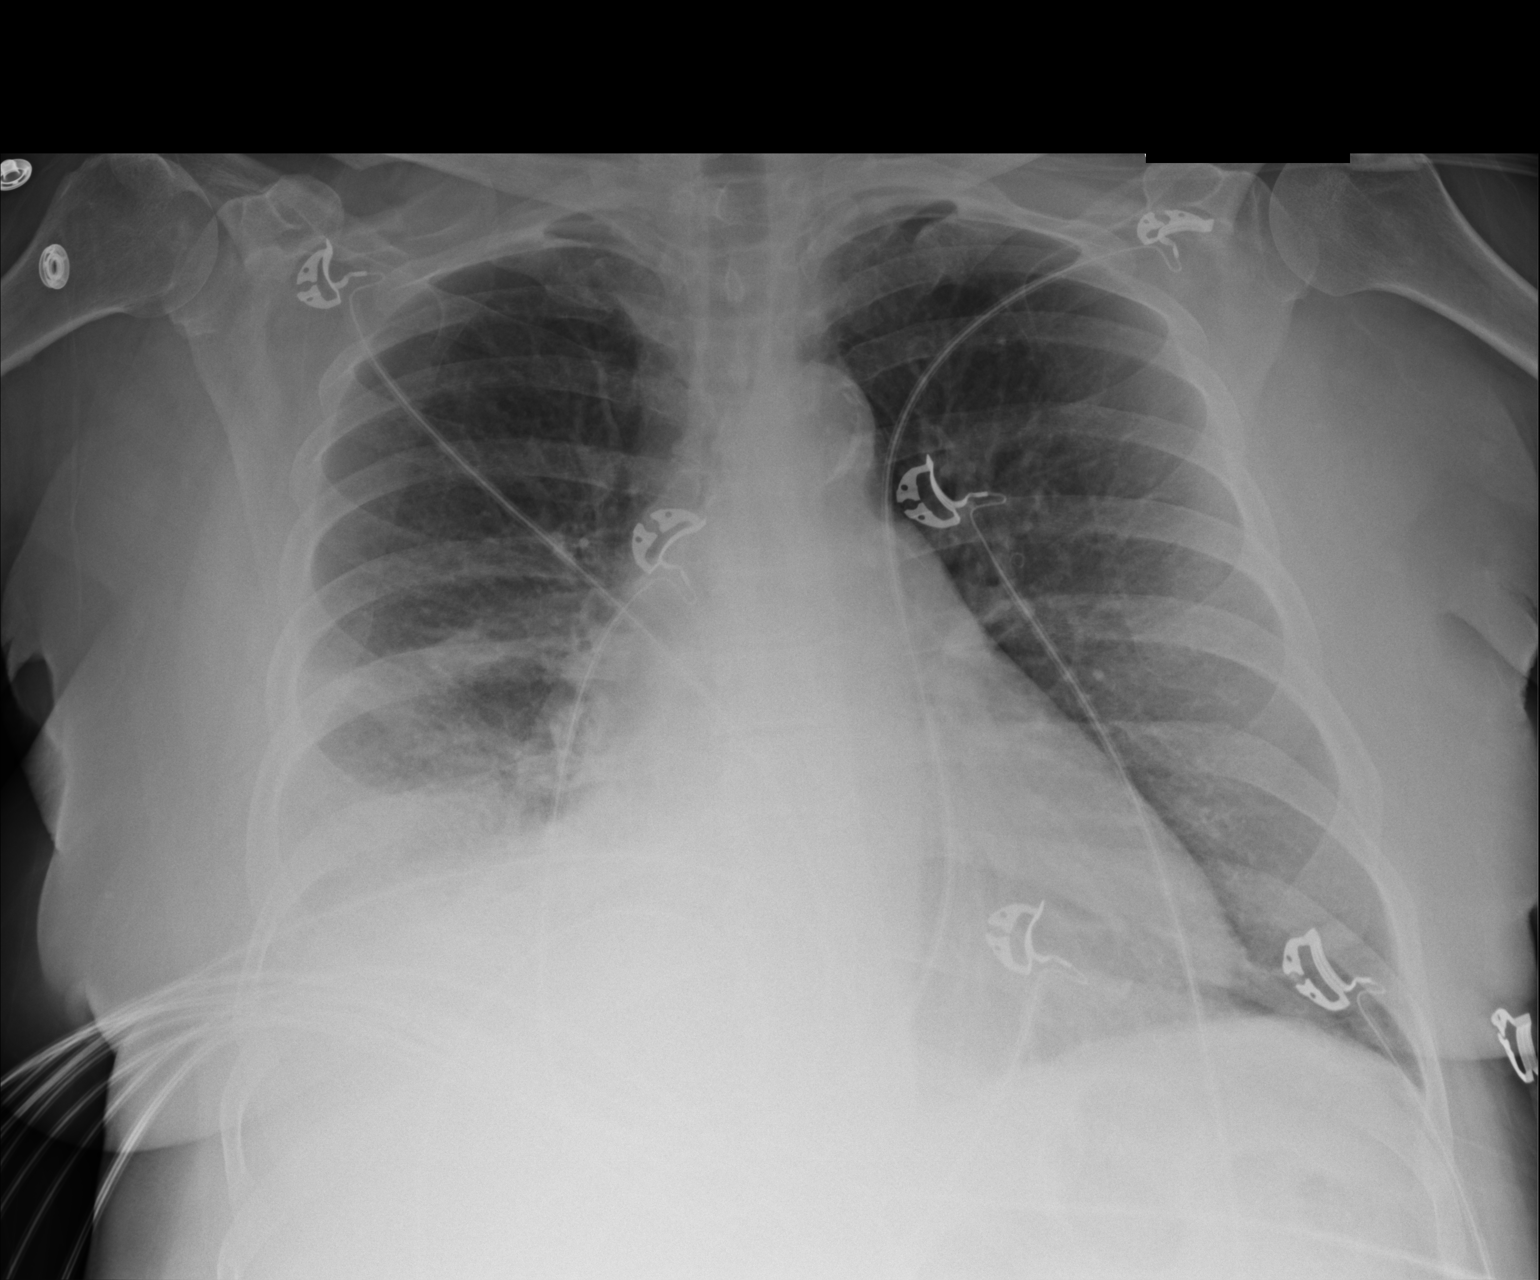

[1 of 1 positions shown; findings below may reference images not displayed]

FINDINGS: The heart is upper limits of normal in size. There is mild
tortuosity and calcification of the thoracic aorta. There is a right
lower lobe infiltrate and effusion. The left lung is clear. The bony
thorax is intact.
IMPRESSION: Right lower lobe infiltrate and effusion.
